# Patient Record
Sex: Male | Born: 1965
Health system: Southern US, Community
[De-identification: ages and names within clinical notes are randomized; demographics above are authoritative.]

## PROBLEM LIST (undated history)

## (undated) ENCOUNTER — Ambulatory Visit (HOSPITAL_COMMUNITY): Disposition: A | Discharge status: Home / Self Care | Payer: Medicare Other

## (undated) DIAGNOSIS — F209 Schizophrenia, unspecified: Secondary | ICD-10-CM

## (undated) DIAGNOSIS — F32A Depression, unspecified: Secondary | ICD-10-CM

## (undated) DIAGNOSIS — F329 Major depressive disorder, single episode, unspecified: Secondary | ICD-10-CM

## (undated) DIAGNOSIS — I10 Essential (primary) hypertension: Secondary | ICD-10-CM

## (undated) DIAGNOSIS — F200 Paranoid schizophrenia: Secondary | ICD-10-CM

---

## 2000-03-29 ENCOUNTER — Encounter: Payer: Self-pay | Admitting: Emergency Medicine

## 2000-03-29 ENCOUNTER — Emergency Department (HOSPITAL_COMMUNITY): Admission: EM | Admit: 2000-03-29 | Discharge: 2000-03-29 | Payer: Self-pay | Admitting: Emergency Medicine

## 2001-05-24 ENCOUNTER — Encounter: Payer: Self-pay | Admitting: Emergency Medicine

## 2001-05-24 ENCOUNTER — Emergency Department (HOSPITAL_COMMUNITY): Admission: EM | Admit: 2001-05-24 | Discharge: 2001-05-25 | Payer: Self-pay | Admitting: Podiatry

## 2001-06-05 ENCOUNTER — Encounter: Payer: Self-pay | Admitting: Emergency Medicine

## 2001-06-05 ENCOUNTER — Emergency Department (HOSPITAL_COMMUNITY): Admission: EM | Admit: 2001-06-05 | Discharge: 2001-06-05 | Payer: Self-pay | Admitting: Emergency Medicine

## 2003-10-30 ENCOUNTER — Emergency Department (HOSPITAL_COMMUNITY): Admission: EM | Admit: 2003-10-30 | Discharge: 2003-10-31 | Payer: Self-pay | Admitting: Emergency Medicine

## 2004-01-21 ENCOUNTER — Emergency Department (HOSPITAL_COMMUNITY): Admission: EM | Admit: 2004-01-21 | Discharge: 2004-01-21 | Payer: Self-pay | Admitting: Emergency Medicine

## 2004-07-21 ENCOUNTER — Ambulatory Visit: Payer: Self-pay | Admitting: Psychiatry

## 2004-07-21 ENCOUNTER — Inpatient Hospital Stay (HOSPITAL_COMMUNITY): Admission: RE | Admit: 2004-07-21 | Discharge: 2004-07-29 | Payer: Self-pay | Admitting: Psychiatry

## 2004-07-24 ENCOUNTER — Encounter: Payer: Self-pay | Admitting: Emergency Medicine

## 2005-07-02 ENCOUNTER — Emergency Department (HOSPITAL_COMMUNITY): Admission: EM | Admit: 2005-07-02 | Discharge: 2005-07-02 | Payer: Self-pay | Admitting: Family Medicine

## 2005-07-27 ENCOUNTER — Emergency Department (HOSPITAL_COMMUNITY): Admission: EM | Admit: 2005-07-27 | Discharge: 2005-07-27 | Payer: Self-pay | Admitting: Family Medicine

## 2005-11-20 ENCOUNTER — Emergency Department (HOSPITAL_COMMUNITY): Admission: EM | Admit: 2005-11-20 | Discharge: 2005-11-20 | Payer: Self-pay | Admitting: Family Medicine

## 2005-12-29 ENCOUNTER — Emergency Department (HOSPITAL_COMMUNITY): Admission: EM | Admit: 2005-12-29 | Discharge: 2005-12-29 | Payer: Self-pay | Admitting: Family Medicine

## 2006-07-02 ENCOUNTER — Emergency Department (HOSPITAL_COMMUNITY): Admission: EM | Admit: 2006-07-02 | Discharge: 2006-07-03 | Payer: Self-pay | Admitting: Emergency Medicine

## 2006-10-24 ENCOUNTER — Emergency Department (HOSPITAL_COMMUNITY): Admission: EM | Admit: 2006-10-24 | Discharge: 2006-10-24 | Payer: Self-pay | Admitting: Emergency Medicine

## 2007-04-03 ENCOUNTER — Inpatient Hospital Stay (HOSPITAL_COMMUNITY): Admission: AD | Admit: 2007-04-03 | Discharge: 2007-04-05 | Payer: Self-pay | Admitting: *Deleted

## 2007-04-03 ENCOUNTER — Ambulatory Visit: Payer: Self-pay | Admitting: *Deleted

## 2008-02-09 ENCOUNTER — Encounter: Payer: Self-pay | Admitting: Emergency Medicine

## 2008-02-10 ENCOUNTER — Inpatient Hospital Stay (HOSPITAL_COMMUNITY): Admission: AD | Admit: 2008-02-10 | Discharge: 2008-02-14 | Payer: Self-pay | Admitting: Psychiatry

## 2008-02-12 ENCOUNTER — Ambulatory Visit: Payer: Self-pay | Admitting: *Deleted

## 2008-02-13 ENCOUNTER — Encounter: Payer: Self-pay | Admitting: Emergency Medicine

## 2008-08-08 ENCOUNTER — Inpatient Hospital Stay (HOSPITAL_COMMUNITY): Admission: AD | Admit: 2008-08-08 | Discharge: 2008-08-14 | Payer: Self-pay | Admitting: Psychiatry

## 2008-08-08 ENCOUNTER — Encounter: Payer: Self-pay | Admitting: Emergency Medicine

## 2008-08-08 ENCOUNTER — Ambulatory Visit: Payer: Self-pay | Admitting: Psychiatry

## 2008-10-04 ENCOUNTER — Emergency Department (HOSPITAL_COMMUNITY): Admission: EM | Admit: 2008-10-04 | Discharge: 2008-10-04 | Payer: Self-pay | Admitting: Emergency Medicine

## 2008-10-14 ENCOUNTER — Emergency Department (HOSPITAL_COMMUNITY): Admission: EM | Admit: 2008-10-14 | Discharge: 2008-10-14 | Payer: Self-pay | Admitting: Family Medicine

## 2009-03-10 ENCOUNTER — Emergency Department (HOSPITAL_COMMUNITY): Admission: EM | Admit: 2009-03-10 | Discharge: 2009-03-10 | Payer: Self-pay | Admitting: Emergency Medicine

## 2009-12-09 ENCOUNTER — Other Ambulatory Visit: Payer: Self-pay

## 2009-12-10 ENCOUNTER — Inpatient Hospital Stay (HOSPITAL_COMMUNITY): Admission: AD | Admit: 2009-12-10 | Discharge: 2009-12-16 | Payer: Self-pay | Admitting: Psychiatry

## 2009-12-10 ENCOUNTER — Ambulatory Visit: Payer: Self-pay | Admitting: Psychiatry

## 2010-02-14 ENCOUNTER — Ambulatory Visit: Payer: Self-pay | Admitting: Cardiovascular Disease

## 2010-02-16 ENCOUNTER — Encounter (INDEPENDENT_AMBULATORY_CARE_PROVIDER_SITE_OTHER): Payer: Self-pay | Admitting: Internal Medicine

## 2010-02-26 ENCOUNTER — Telehealth (INDEPENDENT_AMBULATORY_CARE_PROVIDER_SITE_OTHER): Payer: Self-pay | Admitting: *Deleted

## 2010-03-02 ENCOUNTER — Telehealth (INDEPENDENT_AMBULATORY_CARE_PROVIDER_SITE_OTHER): Payer: Self-pay

## 2010-03-25 ENCOUNTER — Encounter (INDEPENDENT_AMBULATORY_CARE_PROVIDER_SITE_OTHER): Payer: Self-pay | Admitting: *Deleted

## 2010-10-22 ENCOUNTER — Observation Stay (HOSPITAL_COMMUNITY): Admission: EM | Admit: 2010-10-22 | Discharge: 2010-02-16 | Payer: Self-pay | Admitting: Emergency Medicine

## 2010-12-15 NOTE — Progress Notes (Signed)
Summary: Nuclear Pre-Procedure  Phone Note Outgoing Call Call back at [phone] CELL   Call placed by: Stanton Kidney, EMT-P,  February 26, 2010 2:53 PM Action Taken: Phone Call Completed Summary of Call: Left message with information on Myoview Information Sheet (see scanned document for details).     Nuclear Med Background Indications for Stress Test: Evaluation for Ischemia, Post Hospital   History: Echo  History Comments: 02/16/10 Echo: EF= 50-55%  Symptoms: Chest Pain, Syncope    Nuclear Pre-Procedure Cardiac Risk Factors: Family History - CAD, History of Smoking

## 2010-12-15 NOTE — Progress Notes (Signed)
Summary: Myoview No Show  Phone Note Outgoing Call Call back at [facility] Phone [phone]   Call placed by: Irean Hong, RN,  March 02, 2010 1:10 PM Summary of Call: The patient was a no show for myoview today. The home phone number was incorrect, so left a message at the father's home for the patient  to call me back.Aleysia Oltmann,RN. The patient's father called back, and had left messages for the patient to call us. The patient's aunt died this weekend per the patient's father per Milana Na. Notified Dr. Leonard Schwartz Brodie's nurse of no show. Raniyah Curenton,RN.

## 2010-12-15 NOTE — Letter (Signed)
Summary: Appointment - Missed  [facility], Main Office  [address]   [place], [place] [address]   Phone: [phone]  Fax: [phone]     Mar 25, 2010 MRN: [id number]   [facility] [address] [place], [place]  [address]   Dear Damon [name],  Our records indicate you missed your appointment on 03/13/2010 with Dr. Riley Kill. It is very important that we reach you to reschedule this appointment. We look forward to participating in your health care needs. Please contact us at the number listed above at your earliest convenience to reschedule this appointment.     Sincerely, Neurosurgeon Team LG

## 2011-01-31 LAB — CBC
HCT: 39.5 % (ref 39.0–52.0)
Hemoglobin: 13.2 g/dL (ref 13.0–17.0)
MCHC: 33.5 g/dL (ref 30.0–36.0)
RBC: 4.33 MIL/uL (ref 4.22–5.81)
RDW: 13.2 % (ref 11.5–15.5)

## 2011-01-31 LAB — URINALYSIS, ROUTINE W REFLEX MICROSCOPIC
Nitrite: NEGATIVE
Protein, ur: NEGATIVE mg/dL
Specific Gravity, Urine: 1.02 (ref 1.005–1.030)
Urobilinogen, UA: 1 mg/dL (ref 0.0–1.0)

## 2011-01-31 LAB — DIFFERENTIAL
Basophils Absolute: 0 10*3/uL (ref 0.0–0.1)
Basophils Relative: 0 % (ref 0–1)
Eosinophils Relative: 3 % (ref 0–5)
Lymphocytes Relative: 29 % (ref 12–46)
Monocytes Absolute: 0.5 10*3/uL (ref 0.1–1.0)
Monocytes Relative: 9 % (ref 3–12)
Neutro Abs: 3.4 10*3/uL (ref 1.7–7.7)

## 2011-01-31 LAB — BASIC METABOLIC PANEL
CO2: 26 mEq/L (ref 19–32)
Calcium: 8.9 mg/dL (ref 8.4–10.5)
GFR calc Af Amer: >60 mL/min (ref >60)
Glucose, Bld: 99 mg/dL (ref 70–99)
Potassium: 4 mEq/L (ref 3.5–5.1)
Sodium: 137 mEq/L (ref 135–145)

## 2011-01-31 LAB — RAPID URINE DRUG SCREEN, HOSP PERFORMED
Barbiturates: NOT DETECTED
Opiates: NOT DETECTED

## 2011-01-31 LAB — LIPID PANEL
Cholesterol: 181 mg/dL (ref 0–200)
HDL: 86 mg/dL (ref >39)
VLDL: 8 mg/dL (ref 0–40)

## 2011-01-31 LAB — HEMOGLOBIN A1C: Mean Plasma Glucose: 117 mg/dL

## 2011-01-31 LAB — RPR: RPR Ser Ql: NONREACTIVE

## 2011-02-03 LAB — POCT I-STAT, CHEM 8
BUN: 12 mg/dL (ref 6–23)
Calcium, Ion: 1.13 mmol/L (ref 1.12–1.32)
Chloride: 107 mEq/L (ref 96–112)
Glucose, Bld: 87 mg/dL (ref 70–99)
TCO2: 26 mmol/L (ref 0–100)

## 2011-02-03 LAB — CBC
HCT: 42.1 % (ref 39.0–52.0)
Hemoglobin: 13.2 g/dL (ref 13.0–17.0)
Hemoglobin: 14.2 g/dL (ref 13.0–17.0)
MCHC: 33.7 g/dL (ref 30.0–36.0)
MCHC: 34.3 g/dL (ref 30.0–36.0)
MCV: 90.7 fL (ref 78.0–100.0)
MCV: 91.1 fL (ref 78.0–100.0)
Platelets: 273 10*3/uL (ref 150–400)
RBC: 4.24 MIL/uL (ref 4.22–5.81)
RDW: 13.5 % (ref 11.5–15.5)
WBC: 6 10*3/uL (ref 4.0–10.5)

## 2011-02-03 LAB — COMPREHENSIVE METABOLIC PANEL
ALT: 19 U/L (ref 0–53)
CO2: 26 mEq/L (ref 19–32)
Calcium: 8.8 mg/dL (ref 8.4–10.5)
Chloride: 105 mEq/L (ref 96–112)
GFR calc non Af Amer: >60 mL/min (ref >60)
Glucose, Bld: 87 mg/dL (ref 70–99)
Sodium: 138 mEq/L (ref 135–145)
Total Bilirubin: 1 mg/dL (ref 0.3–1.2)

## 2011-02-03 LAB — BASIC METABOLIC PANEL
BUN: 7 mg/dL (ref 6–23)
CO2: 24 mEq/L (ref 19–32)
Chloride: 105 mEq/L (ref 96–112)
Glucose, Bld: 97 mg/dL (ref 70–99)
Potassium: 3.9 mEq/L (ref 3.5–5.1)
Sodium: 136 mEq/L (ref 135–145)

## 2011-02-03 LAB — URINE DRUGS OF ABUSE SCREEN W ALC, ROUTINE (REF LAB)
Barbiturate Quant, Ur: NEGATIVE
Benzodiazepines.: NEGATIVE
Cocaine Metabolites: NEGATIVE
Creatinine,U: 85.4 mg/dL
Ethyl Alcohol: <10 mg/dL (ref <10)
Opiate Screen, Urine: NEGATIVE
Phencyclidine (PCP): NEGATIVE

## 2011-02-03 LAB — LIPID PANEL
Cholesterol: 186 mg/dL (ref 0–200)
HDL: 99 mg/dL (ref >39)
LDL Cholesterol: 79 mg/dL (ref 0–99)
Total CHOL/HDL Ratio: 1.9 RATIO

## 2011-02-03 LAB — POCT CARDIAC MARKERS: Troponin i, poc: <0.05 ng/mL (ref 0.00–0.09)

## 2011-02-03 LAB — CARDIAC PANEL(CRET KIN+CKTOT+MB+TROPI)
CK, MB: 1.3 ng/mL (ref 0.3–4.0)
Relative Index: 0.3 (ref 0.0–2.5)
Troponin I: 0.01 ng/mL (ref 0.00–0.06)

## 2011-02-24 LAB — URINALYSIS, ROUTINE W REFLEX MICROSCOPIC
Glucose, UA: NEGATIVE mg/dL
Hgb urine dipstick: NEGATIVE
Ketones, ur: 15 mg/dL — AB
Protein, ur: NEGATIVE mg/dL
pH: 6 (ref 5.0–8.0)

## 2011-02-24 LAB — POCT I-STAT, CHEM 8
BUN: 8 mg/dL (ref 6–23)
Calcium, Ion: 1.19 mmol/L (ref 1.12–1.32)
Creatinine, Ser: 1.2 mg/dL (ref 0.4–1.5)
Hemoglobin: 15.3 g/dL (ref 13.0–17.0)
Sodium: 138 mEq/L (ref 135–145)
TCO2: 26 mmol/L (ref 0–100)

## 2011-02-24 LAB — RAPID URINE DRUG SCREEN, HOSP PERFORMED
Amphetamines: NOT DETECTED
Barbiturates: NOT DETECTED
Benzodiazepines: NOT DETECTED
Cocaine: NOT DETECTED

## 2011-02-24 LAB — ETHANOL: Alcohol, Ethyl (B): <5 mg/dL (ref 0–10)

## 2011-03-30 NOTE — H&P (Signed)
NAMEKINGSLEY, FARACE              ACCOUNT NO.:  [id number]   MEDICAL RECORD NO.:  [id number]          PATIENT TYPE:  IPS   LOCATION:  0403                          FACILITY:  BH   PHYSICIAN:  Anselm Jungling, MD  DATE OF BIRTH:  Aug 01, 1966   DATE OF ADMISSION:  08/08/2008  DATE OF DISCHARGE:                       PSYCHIATRIC ADMISSION ASSESSMENT   TIME:  1430 p.m.   IDENTIFYING INFORMATION:  This is a single [place] American male, 45  years old.  This is a voluntary admission.   HISTORY OF PRESENT ILLNESS:  This is the sixth [facility] admission for this 45  year old with a history of undifferentiated schizophrenia who presented  in the emergency room complaining of auditory hallucinations.  The  voices told him to go ahead and jump from a building last night, so he  did.  Just sustained a minor back strain and was treated with ibuprofen  in the emergency room.  He reports that he stopped taking his Haldol  around the middle of this summer.  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  Gradually  the auditory hallucinations returned.  Initially the voices were saying  fairly benign things.  They began to get more demanding and suspicious  in nature, and then told him that he should not pay his bills, that [employer] and other utilities were just trying to get his money.  He put  some money in the trust of a friend, who ended up spending it.  Now he  is facing eviction.  The voices continue to have considerable paranoid  content along with some commands for self-harm.  He reports when he was  on the medication he felt that he was doing quite well and he is asking  to get back on it.  He denies any substance abuse.  Tried alcohol once  earlier in his life and it did not agree with him.  Denies using any  other substances.   PAST PSYCHIATRIC HISTORY:  A sixth [facility] admission.  He was last here in  2023-04-04 of this year.  At that time stabilized  on 20 mg of Haldol p.o.  q.h.s.  He reports his usual dose at home is more like around 5 mg p.o.  q.h.s.  He denies trials of any other antipsychotics but the record  reflects that he has been previously treated with 0.25 mg of Risperdal  in the morning and 2 mg at bedtime on an earlier stay in 04/03/04.  Since  then he has been fairly consistently on Haldol, is followed by Dr. De Nurse at [facility].  He has a history of taking  Zoloft in the past for some depression but is currently not taking this.   SOCIAL HISTORY:  A single African American male.  Says that he does have  a girlfriend and this relationship does cause him some stress.  Mother  died of colon cancer in 04-Apr-2007.  His sister had died approximately  2 months ago in [place] of a cardiac condition.  He has a brother here  in town from whom he is estranged.  He has also recently had an aunt  that died.  He lives alone in his own apartment and receives disability.  He completed high school, has been married and divorced once.  He has 4  sons ages 24, 41, 73 and 75 that live with his wife.  He had vocational  training as a Games developer.  He receives Social Security disability  income for his schizophrenia.   FAMILY HISTORY:  Several uncles with problems with substance abuse.  He  also reports mother, father, a brother and a sister also with mental  illness.   MEDICAL HISTORY:  No primary care practitioner.   CURRENT MEDICATIONS:  Haldol dose unknown, last discharged on 20 mg p.o.  q.h.s.   DRUG ALLERGIES:  None.   POSITIVE PHYSICAL FINDINGS:  Physical exam was done in the emergency  room.  This is a 5 feet 10 inch tall African American male, slim build,  appears healthy, 149 pounds, temperature 98, pulse 61, respirations 20,  blood pressure 134/80.   He was treated with ibuprofen in the emergency room.   Lumbar spine films were negative for any acute injury.  Today he has no  subjective  complaints.   DIAGNOSTIC STUDIES:  Alcohol level 11.  Urine drug screen negative for  all substances.  Urinalysis within normal limits.  CBC:  WBC 6.4,  hemoglobin 14.1, hematocrit 41.7 and platelets 290,000.  Chemistry:  Sodium 139, potassium 3.9, chloride 106, carbon dioxide 25, BUN 8,  creatinine 1.3, and random glucose is 80.   MENTAL STATUS EXAM:  A fully alert gentleman, pleasant, cooperative,  reclining in bed, fully awake and alert.  Speech is normal.  He is  polite.  Affect appropriate.  Quick responses.  Good eye contact.  Fully  engaged in conversation.  Mood is neutral.  He does admit to being quite  worried about his bills, afraid that he is going to be evicted from his  apartment.  Thought process is logical and coherent.  He is denying any  auditory hallucinations today and he did receive Haldol 10 mg yesterday  evening.  He is oriented to person, place and situation.  No delusional  statements made today.  No evidence of guarding or paranoia.  Denies any  intent for suicide.  Expresses that his main goal is to get back on his  medications and stay on his medications.  He is fearful of  hallucinations.   AXIS I:  Schizophrenia, undifferentiated acute exacerbation.  Rule out  alcohol abuse.  AXIS II:  Deferred.  AXIS III:  No diagnosis.  AXIS IV:  Acute problem with finances.  AXIS V:  Current 48.  Past year not known.   PLAN:  To voluntarily admit him.  We have admitted him to our  stabilization unit, our intensive care unit, with a goal of improving  his reality testing, decreasing his paranoia and controlling his  symptoms.  He has requested to go down to 5 mg of Haldol q.h.s. and we  will do a trial of this.  Initially we provided Ativan 1 mg q.3 h p.r.n.  for any withdrawal but he has not had withdrawal symptoms, so we will  discontinue that at this time.  Meanwhile, we are going to ask our case  worker to work with him to see what we can do to help stabilize  his  situation with his finances, possibly intervene,  and he does not have a  regular primary care physician.  Will check a hemoglobin A1c, lipid  panel and TSH.      Margaret A. Lorin Picket, N.P.      Anselm Jungling, MD  Electronically Signed    MAS/MEDQ  D:  08/09/2008  T:  08/10/2008  Job:  [phone]

## 2011-03-30 NOTE — Discharge Summary (Signed)
NAMEMarland Kitchen  ERCELL, RAZON              ACCOUNT NO.:  [id number]   MEDICAL RECORD NO.:  [id number]          PATIENT TYPE:  IPS   LOCATION:  0300                          FACILITY:  BH   PHYSICIAN:  Jasmine Pang, M.D. DATE OF BIRTH:  05-18-1966   DATE OF ADMISSION:  04/03/2007  DATE OF DISCHARGE:  04/05/2007                               DISCHARGE SUMMARY   IDENTIFICATION:  A 45 year old single African American male admitted on  a voluntary basis on Apr 03, 2007.   HISTORY OF PRESENT ILLNESS:  The patient presented with a history of  depression and auditory hallucinations.  He states for about a week he  has been hearing voices telling him to hurt himself.  He has no specific  plans.  He had been off his medication for a couple of days because he  was in jail for 30 days for child support.  He was unable to get into  his home after discharge because the locks had been changed.  He denied  any substance abuse.  Stressors are that his mother died this year on  mother's day.  The patient has been hospitalized here in the past.  He  is outpatient at mental health services at [facility]  health.  He sees Dr. Hortencia Pilar.  He has a history of schizoaffective  disorder.  He denies is a nonsmoker, nondrinker, does not use drugs.  He  denies any acute or chronic health problems.  He has been on Haldol 2.5  mg p.o. nightly and Zoloft prior to admission.  Again, he reports being  noncompliant for at least the past 2 days possibly longer.   ALLERGIES:  He has no known drug allergies.   PHYSICAL FINDINGS:  This is a middle-aged male who was assessed at the  [facility] emergency department.  He is in no acute distress, resting in  bed.   ADMISSION LABORATORIES:  Hepatic function was within normal limits.  TSH  was 1.293 which was within normal limits.  The other labs were done in  the ED at [facility] emergency department:  White count was slightly low  at 10.7.  Urinalysis was  negative.  Urine drug screen was negative.  B-  Met was within normal limits.   HOSPITAL COURSE.:  Upon admission the patient was continued on Haldol  2.5 mg p.o. nightly.  He was also started on Seroquel 50 mg p.o. q. 6  hours p.r.n. anxiety.  He was started on Haldol 2 mg p.o. q. 6 hours  p.r.n. agitation.  Seroquel was discontinued.  He was started on  trazodone 50 mg p.o. nightly p.r.n. insomnia, may repeat times one.  He  was continued on Zoloft 50 mg p.o. daily.   The patient responded well to these medications.  No changes were made  since he seemed to do well on these medicines when he was compliant.  I  felt he needed to be back on his medicines rather than make changes due  to his history of noncompliance.  The patient was cooperative on the  unit.  He participated in  unit therapeutic groups and activities.  He  was friendly and cooperative.  He was upset because the locks on his  apartment had been changed by his father.  This was because someone had  gotten his keys and the neighborhood teenagers were partying in his  apartment.  He was relieved when he found out that he would be able to  get his keys back as soon as he was discharged.  The patient's sleep and  appetite were good.  He had no auditory or visual hallucinations.  Voices resolved.  On 04/05/2007 mental status had improved markedly from  admission status.  The patient was friendly cooperative with good eye  contact.  Speech was normal rate and flow.  Psychomotor activity was  within normal limits.  Mood was euthymic.  Affect wide range.  There was  no suicidal or homicidal ideation.  No thoughts of self injurious  behavior.  No auditory or visual hallucinations.  No paranoia or  delusions.  Thoughts were logical and goal-directed.  Thought content no  predominant theme.  Cognitive was grossly back to baseline.  It was felt  the patient was safe to be discharged home today.  His landlord was  going to give him his  new key so he could get into his apartment.   DISCHARGE DIAGNOSES:  AXIS I: Schizoaffective disorder, depressed mood.  AXIS II: None.  AXIS III: No acute or chronic health problems.  AXIS IV:  Severe (psychosocial problems, recent problems related to  legal system, burden of psychiatric illness).  AXIS V: GAF upon discharge was 50.  GAF upon admission was 30.  GAF  highest past year was 65.   DISCHARGE PLANS:  There were no specific activity level or dietary  restrictions.   POST HOSPITAL CARE PLANS:  The patient will be seen at the [facility] by Dr. Lang Snow on Thursday March 22 and 4:00 p.m.   DISCHARGE MEDICATIONS:  Zoloft 50 mg p.o. daily Haldol 2.5 mg p.o.  nightly, trazodone 50 mg 1-2 pills at bedtime p.r.n. insomnia.      Jasmine Pang, M.D.  Electronically Signed     BHS/MEDQ  D:  04/05/2007  T:  04/05/2007  Job:  [id number]

## 2011-03-30 NOTE — H&P (Signed)
NAMEMarland Kitchen  ENNIO, HOUP              ACCOUNT NO.:  [id number]   MEDICAL RECORD NO.:  [id number]          PATIENT TYPE:  IPS   LOCATION:  0300                          FACILITY:  BH   PHYSICIAN:  Jasmine Pang, M.D. DATE OF BIRTH:  1966-05-14   DATE OF ADMISSION:  04/03/2007  DATE OF DISCHARGE:                       PSYCHIATRIC ADMISSION ASSESSMENT   A 45 year old single African male voluntarily admitted on Apr 03, 2007.   HISTORY OF PRESENT ILLNESS:  The patient presented with a history of  depression, auditory hallucinations for about a week telling him to  hurt himself. No specific plan.  He has been off his medications for a  couple days because he states he was in jail for 30 days for child  support, and was unable to get to his home.  He denies any substance  abuse.  Stressors are that his mother died this year on Mother's Day.   PAST PSYCHIATRIC HISTORY:  The patient has been hospitalized here prior.  Is outpatient for mental health services at [facility].  Sees Dr. Hortencia Pilar.  He is a history of schizoaffective  disorder.   SOCIAL HISTORY:  This is a 45 year old single African male.  He has 4  boys.  He does not live with them.  The patient states he lives alone.  He has been in jail recently for 30 days for child support.  He is not  on probation at this time.   FAMILY HISTORY:  None.   ALCOHOL AND DRUG HISTORY:  Nonsmoker, nondrinker.  No drug use.   PRIMARY CARE Cian Costanzo:  He has no primary care Kameryn Tisdel.   MEDICAL PROBLEMS:  Denies any acute or chronic health issues.   MEDICATIONS:  He has been on Haldol 2.5 mg at h.s. and Zoloft a half a  tablet.  Again reports noncompliant for 2 days.   DRUG ALLERGIES:  No known allergies.   PHYSICAL EXAM:  This is a middle-aged male that was assessed at [facility] Emergency Department.  He is in no acute distress, resting in bed.  Temperature is 97.6, 77 heart rate, 20 respirations, blood pressure  132/82, 5 feet 11 inches tall, 147 pounds, 98% saturated.   LABORATORY DATA:  White count is 10.7.  Urinalysis is negative.  Urine  drug screen is negative.  His BMET is within normal limits.   MENTAL STATUS EXAM:  He is sleepy in bed.  He is cooperative.  Fair eye  contact.  Speech is clear.  Does not offer other information other than  what is asked.  Mood is depressed.  The patient's affect is flat.  Though process endorsing auditory hallucinations with suicidal thoughts.  Promises safety.  Does not appear to be actively responding.  Cognitive  function intact.  Memory is fair.  Judgment and insight is fair.   AXIS I:  Schizoaffective disorder.  AXIS II:  Deferred.  AXIS III:  No acute or chronic health issues.  AXIS IV:  Possible problems with housing, other psychosocial problems,  recent problems related to legal system.  AXIS V:  Current is 30.  PLANS:  Contract for safety, stabilize mood. We will resume his Haldol  and Zoloft.  We will contact father for background information with the  patient's permission.  We will reinforce medication compliance.  The  patient is to follow up wit [facility].  Tentative  length of stay is 4 to 6 days.      [name], N.P.      Jasmine Pang, M.D.  Electronically Signed    JO/MEDQ  D:  04/03/2007  T:  04/03/2007  Job:  [id number]

## 2011-03-30 NOTE — H&P (Signed)
NAMEMarland Kitchen  [name], Damon              ACCOUNT NO.:  [id number]   MEDICAL RECORD NO.:  [id number]          PATIENT TYPE:  IPS   LOCATION:  0401                          FACILITY:  BH   PHYSICIAN:  Anselm Jungling, MD  DATE OF BIRTH:  03-15-1966   DATE OF ADMISSION:  02/10/2008  DATE OF DISCHARGE:                       PSYCHIATRIC ADMISSION ASSESSMENT   IDENTIFYING INFORMATION/JUSTIFICATION FOR ADMISSION AND CARE:  This is a  45 year old divorced African-American male. He presented at the [facility] emergency department yesterday. He acknowledged  that he was having auditory hallucinations. He had been off his  medications for several weeks. The auditory hallucinations were command  to hurt others and he was also thinking that his food and mediations  were poisoned because of the auditory hallucinations. He states a few  weeks ago, it got him upset that he was not working and he tried to work  and he thought that by stopping his medication, it would allow him to  work better. However, he lost his job and now he has hallucinations due  to non-compliance.   PAST PSYCHIATRIC HISTORY:  He was with [place] last May, Mar 30, 2007 to Apr 05, 2007 and he has had outpatient followup at the [facility] under the care of Dr. Hortencia Pilar.   SOCIAL HISTORY:  He completed high school. He has also had vocational  training as a Games developer. He has been married and divorced once. He  has 4 sons, ages 62, 59, 34, and 75. He gets SSDI for his schizophrenia  and he lives alone in an apartment.   FAMILY HISTORY:  He reports that his mother, father, brother, and sister  all have mental illness and are all on disability except his mother has  passed several years ago.   ALCOHOL/DRUG HISTORY:  He states his uncles abuse substances.   PRIMARY CARE PHYSICIAN:  He has no PCC. He is followed by Dr. Hortencia Pilar  at [facility].   PAST MEDICAL HISTORY:  He  does not know of any.   MEDICATIONS:  He reports that he is prescribed Haldol and Zoloft from  [facility] and he also gets his medications through  their pharmacy, so we cannot reconcile his dosages.   ALLERGIES:  NO KNOWN DRUG ALLERGIES.   PHYSICAL EXAMINATION:  GENERAL:  He was medically cleared in the  emergency department at [facility]. He specifically  had no alcohol or any other abnormal substances or illicit substances.  VITAL SIGNS:  He is 71 inches tall. He weighs 180. Temperature 98.6,  blood pressure 111/74, pulse 95.   MENTAL STATUS EXAM:  He is alert and oriented. He is appropriately  groomed, dressed, and nourished. He is in a hospital gown. Speech is  normal rate, rhythm, and tone. Mood is euthymic. Affect has a normal  range. Judgment and insight are intact. He realizes he needs to stay  medication compliance. Concentration and memory are intact. Intelligence  is at least average. He denies being suicidal or homicidal at this time.  He also reports that  the auditory hallucinations are decreasing and that  he cannot discern what in fact that are saying and now mumbling. He was  encouraged that this meant that he was responding to his medication.   DIAGNOSIS:  AXIS I:     Schizophrenia, undifferentiated type.  AXIS II:    Deferred.  AXIS III:   None known.  AXIS IV:    Burden of illness.  AXIS V:     30.   PLAN:  1. Admit him for safety and stabilization.  2. Restart his medication. Toward that end, Haldol 10 mg p.o. b.i.d.      was started, as well as Cogentin 1 mg b.i.d.  3. 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   ESTIMATED LENGTH OF STAY:  No more than 4 or 5 days.      Mickie Leonarda Salon, P.A.-C.      Anselm Jungling, MD  Electronically Signed    MD/MEDQ  D:  02/10/2008  T:   02/10/2008  Job:  [id number]

## 2011-04-02 NOTE — H&P (Signed)
[name]  Damon, [name]                        ACCOUNT NO.:  [id number]   MEDICAL RECORD NO.:  [id number]                   PATIENT TYPE:  IPS   LOCATION:  0404                                 FACILITY:  [facility]   PHYSICIAN:  Geoffery Lyons, M.D.                   DATE OF BIRTH:  1966-06-03   DATE OF ADMISSION:  07/21/2004  DATE OF DISCHARGE:                         PSYCHIATRIC ADMISSION ASSESSMENT   IDENTIFYING INFORMATION:  The patient is a 45 year old divorced African-  American male voluntarily admitted on July 21, 2004.   HISTORY OF PRESENT ILLNESS:  The patient presents with a history of positive  auditory hallucinations of command type.  He has been hearing voices since  yesterday.  He states they are controlling him to put up cameras, telling  him not to trust anyone.  He reports he is having difficulty establishing  reality, experiencing positive paranoid ideation.  He feels that someone is  following him although he has no reason for this.  He states that although  he has heard voices in the past, they are the most intense at this time.  The patient's stressors are that his friend does not understand his illness.  He is also experiencing positive homicidal ideation through the voices with  voices telling him to hurt anyone who tries to hurt you.  The patient  reports his biggest enemy is himself and he was also having thoughts to harm  himself.  He has not been sleeping well.  His appetite has been decreased  with a 10 pound weight loss.   PAST PSYCHIATRIC HISTORY:  This is the second hospitalization at [facility].  He was here when it was [facility].  He has been at  [facility] in the past for hallucinations.  He sees Dr. Hortencia Pilar  at [facility].  He had a suicide gesture in the  past when he tried to step out into traffic.   SUBSTANCE ABUSE HISTORY:  The patient chews tobacco.  He denies any alcohol  or drug  use.   PAST MEDICAL HISTORY:  Primary care Anwar Crill: None.  Medical problems: None.   MEDICATIONS:  He has been on Haldol 2 mg at bedtime, was on that for years,  stopped it approximately 90 days ago due to side effects and then felt once  he stopped that he was having difficulty getting back on the medicine as  voices were telling him that he did not feel like he needed to continue with  any medications.   DRUG ALLERGIES:  No known allergies.   REVIEW OF SYSTEMS:  Some occasional nausea, difficulty with insomnia.  The  patient does chew tobacco.  He denies any chest pain or shortness of breath.  Review of others systems is negative.   PHYSICAL EXAMINATION:  GENERAL:  Physical examination was performed.  He is  a well nourished male in no  acute distress.  VITAL SIGNS:  Temperature 98.3, heart rate 75, respirations 16, blood  pressure 141/79.  He is 5 feet 11 inches tall.  NECK:  Negative lymphadenopathy.  CHEST:  Clear.  HEART:  Regular rate and rhythm.  ABDOMEN:  Soft, nontender.  SKIN:  Warm and dry with no rashes or lesions.  NEUROLOGIC:  Findings are intact, nonfocal.   LABORATORY DATA:  CBC is within normal limits.  CMET: BUN is 5.  TSH 1.083.  Urinalysis and urine drug screen are still pending.   SOCIAL HISTORY:  This is a 45 year old divorced African-American male.  He  has four boys ages 24, 23, 33, and 10.  The children are with the mother of  those children.  He was married for six years.  He was living with his  friend although he feels that because of his actions and behavior that his  living situation may not still be in place.  He is on disability and feels  there may be some possibility of legal charges as he had put up several  different surveillance cameras in his home.   FAMILY HISTORY:  Brother with schizophrenia as well as his mother who is  currently deceased, sister with bipolar disorder.   MENTAL STATUS EXAM:  This is a cooperative male, no eye contact,  casually  dressed.  Speech is clear.  Mood is depressed and anxious.  The patient has  some mild anxiety.  Thought processes: The patient provides coherent answers  although he is experiencing positive auditory hallucinations, ruminating  over trying to understand what is real and what is not.  Cognitive  functioning: Intact.  Memory is fair.  Judgment is fair.  Insight is fair.   ADMISSION DIAGNOSES:   AXIS I:  Paranoid schizophrenia.   AXIS II:  Deferred.   AXIS III:  None.   AXIS IV:  Problems possibly related to housing, other psychosocial problems  related to support of his mental illness.   AXIS V:  Current is 30, this past year is 72.   INITIAL PLAN OF CARE:  Plan is to stabilize mood and thinking.  Will add  Risperdal for psychosis, consider intramuscular Risperdal.  Will add Zoloft  for the patient's complaints of depression and social anxiety.  Will also  have Ativan available throughout the day for symptoms of anxiety, have  Ambien for sleep.  Will have a family session with the patient's support  group.  Reassure the patient.  The patient is to follow up at [facility], to be medication compliant.   ESTIMATED LENGTH OF STAY:  Five to six days.     [name], N.P.                       Geoffery Lyons, M.D.    JO/MEDQ  D:  07/22/2004  T:  07/22/2004  Job:  [id number]

## 2011-04-02 NOTE — Discharge Summary (Signed)
NAMEMarland [name]  NILS, THOR              ACCOUNT NO.:  [id number]   MEDICAL RECORD NO.:  [id number]          PATIENT TYPE:  IPS   LOCATION:  0404                          FACILITY:  [facility]   PHYSICIAN:  Geoffery Lyons, M.D.      DATE OF BIRTH:  1966/08/12   DATE OF ADMISSION:  07/21/2004  DATE OF DISCHARGE:  07/29/2004                                 DISCHARGE SUMMARY   CHIEF COMPLAINT AND PRESENTING ILLNESS:  This was the second admission to  [facility]  for this 45 year old divorced African-American  male, voluntarily admitted.  History of positive auditory hallucinations  with command type.  Hearing voices since the day before.  They were  controlling him to put up cameras, telling him not to trust anyone.  Having  difficulty establishing reality.  Positive for paranoid ideation, felt that  someone was following him, although he has no reason for this.  Although he  has heard voices in the past they are more intensive this time.  Stressors  are that his friend does not understand his illness, also experiencing  positive homicidal ideas toward the voices, with voices telling him to hurt  anyone who tries to hurt him.  He claims his biggest enemy is himself.  He  was also having thoughts to harm himself.  Not sleeping well, decreased  appetite, has lost 10 pounds.   PAST PSYCHIATRIC HISTORY:  Second time [employer], was admitted to  [facility] as well as [facility].  Sees Dr. Hortencia Pilar in  [place].   ALCOHOL AND DRUG HISTORY:  Denies the use or abuse of any substances.   PAST MEDICAL HISTORY:  Noncontributory.   MEDICATIONS:  Haldol which he stopped 90 days ago due to side effects.   PHYSICAL EXAMINATION:  Performed, failed to show any acute findings.   LABORATORY WORKUP:  CBC within normal limits.  CMET within normal limits.  TSH 1.083.  Drug screen negative for substances of abuse.   MENTAL STATUS EXAM:  Upon admission revealed an alert,  cooperative male, no  eye contact, casually dressed.  Speech was clear, mood was depressed and  anxious, some mild anxiety.  Thought process, provide coherent answers  although experiencing positive auditory hallucinations, ruminating over  trying to understand what is real and what is not.  Cognition well  preserved.   ADMISSION DIAGNOSES:   AXIS I:  Rule out paranoid schizophrenia.   AXIS II:  No diagnosis.   AXIS III:  None.   AXIS IV:  Moderate.   AXIS V:  Global assessment of function upon admission 30, highest global  assessment of function in past year 65.   COURSE IN HOSPITAL:  He was admitted and started on individual and group  psychotherapy.  He was started on Risperdal 0.5.  He was given Ativan as  needed as well as Cogentin.  He was given Ambien for sleep.  He was placed  on Risperdal 0.25 in the morning, 0.5 at night.  He was placed on Zoloft 25  mg daily.  Risperdal was increased to 0.25 in the  morning and 0.75 at night.  We continued to work with the Risperdal, up to 0.25 twice a day and 2 mg at  night.  Initially he endorsed worsening of the hallucinations, never like he  had experienced them at the time of this evaluation.  Also endorsed  depression, anxiety, overwhelmed, overwhelmed with the voices.  He was  mostly secluded to self, not interacting much with people in the unit.  He  has some subjective changes on the left side.  They were thought to be  secondary to the medication.  September 8, endorsed that the voices were  starting to calm down, wanting to pursue the Risperdal further.  There were  no overt side effects.  September 12, had an episode where he felt really  threatened, very paranoid, strong response towards another patient and he  understood that this patient had not done anything to him so he was able to  check his own reality.  We continued to increase the Risperdal.  He was  concerned about the girlfriend as he wanted to communicate to the  girlfriend  that he was in the hospital but he had no means of getting in touch with  her, only if his father would get the phone number and the father was not  willing to do that.  That created some stress, but eventually he was able to  deal with it.  On September 14 he was in full contact with reality.  Endorsed no suicidal ideas, no homicidal ideas, no hallucinations, no  delusions.  The voices were under control.  There was increased insight,  committed to abstinence, will to pursue further outpatient treatment and be  compliant with medications.   DISCHARGE DIAGNOSES:   AXIS I:  Schizophrenia, paranoid type.   AXIS II:  No diagnosis.   AXIS III:  No diagnosis.   AXIS IV:  Moderate.   AXIS V:  Global assessment of function upon discharge 50.   DISCHARGE MEDICATIONS:  1.  Cogentin 0.5 twice a day.  2.  Zoloft 50 mg daily.  3.  Risperdal 0.25 twice a day and 2 mg at night.  4.  Ambien 10 at bedtime for sleep.   DISPOSITION:  Follow up with [facility].     Farrel Gordon   IL/MEDQ  D:  08/19/2004  T:  08/20/2004  Job:  [id number]

## 2011-04-02 NOTE — Discharge Summary (Signed)
NAMEMarland Kitchen  Damon [name], Damon [name]              ACCOUNT NO.:  [id number]   MEDICAL RECORD NO.:  [id number]          PATIENT TYPE:  IPS   LOCATION:  0403                          FACILITY:  [facility]   PHYSICIAN:  Jasmine Pang, M.D. DATE OF BIRTH:  09-03-1966   DATE OF ADMISSION:  08/08/2008  DATE OF DISCHARGE:  08/14/2008                               DISCHARGE SUMMARY   IDENTIFYING INFORMATION:  This is a 45 year old single African American  male who was admitted on a voluntary basis on August 08, 2008.   HISTORY OF PRESENT ILLNESS:  This is a 6th [facility] admission for this 35-  year-old with a history of undifferentiated schizophrenia, who presented  in the emergency room complaining of auditory hallucinations.  The  voices told him to go ahead and jump from a building last night, so he  did.  He sustained a minor back strain and was treated with ibuprofen in  the emergency room.  He reports that he stopped taking his Haldol around  the middle of the summer.  At that point, he was working a Holiday representative  job.  Originally, the auditory hallucinations were drawn.  Initially,  the voices were saying fairly benign things.  They began, however, to  get more demanding and suspicious in nature.  They told him he should  not pay his bills, due to power of utilities or just trying to get his  money.  He put some money in the trust of a friend, who ended up  spending it.  Now, he is facing eviction.  The voices continue and have  considerable paranoid content along with some commands for self-harm.  He reports when he was on medication, he felt he was doing quite well.  He was asking to get back on it.  He denies any substance abuse.  He  tried alcohol once earlier in his life and it did not agree with him.  He denies using any other substances.   PAST PSYCHIATRIC HISTORY:  This is the 6th [facility] admission.  He was last  here in the May of this year.  At that time, he was stabilized on 20 mg  of Haldol p.o.  q.h.s.  He reports his usual dose at home as more like 5  mg p.o. q.h.s.  He denies trails of other antipsychotics, but the record  reflects that he has previously been treated with 0.25 mg of Risperdal  in the morning and 2 mg in the bedtime on an earlier stay in 2005.  Since then he has been fairly consistently on Haldol.  He is followed by  Dr. De Nurse at the [facility].  He has a  history of taking Zoloft in the past for some depression, but is  currently not taking this.   FAMILY HISTORY:  Several uncles have problems with substance abuse.  He  also reports mother, father, brother, and sister have mental illness.   CURRENT MEDICATIONS:  Haldol, dose unknown.  Last discharge on 20 mg  p.o. q.h.s.   DRUG ALLERGIES:  None.   MEDICAL HISTORY:  None.  PHYSICAL FINDINGS:  Physical exam was done in the emergency room.  There  were no acute physical or medical problems noted.  Lumbar spine films  were negative for any acute injury.  Today, he has no subjective  complaints.   DIAGNOSTIC STUDIES:  Alcohol level was 11.  Urine drug screen negative  for all substances.  Urinalysis within normal limits.  CBC reveals WBC  of 4.6, hemoglobin of 14.1, hematocrit of 41.7, and platelets 290,000.  Chemistries revealed a sodium of 139, potassium of 3.9, chloride of 106,  carbon dioxide 25, BUN 80, creatinine 1.3, random glucose 80.   HOSPITAL COURSE:  Upon admission, the patient was started on Ambien 10  mg p.o. q.h.s. p.r.n. insomnia.  He was also started on Dulcolax 2  tablets p.o. q.h.s.,  Epogen 1 mg p.o. b.i.d. and 1 mg t.i.d. p.r.n.  EPS, Colace 100 mg daily, MiraLax 17 g daily, Protonix 40 mg p.o.  b.i.d., Haldol 10 mg p.o. q.h.s. and 10 mg p.o. b.i.d. p.r.n. agitation,  Ativan 1 mg every 3 hours p.r.n. withdrawal or anxiety symptoms.  On  August 09, 2008, Haldol was decreased to 5 mg p.o. q.h.s. and 5 mg  p.o. q.6 hours p.r.n. agitation or  hallucinations.  The Ativan was  discontinued.  Hemoglobin A1c was 5.7.  Lipid profile was remarkable for  cholesterol of 203 (0 to 200).  TSH was within normal limits.  He was  also started on nicotine 21 mg patch daily.  On August 11, 2008,  Haldol was increased to 10 mg p.o. q.h.s.  In individual sessions, the  patient was pleasant with no delusional statements, he appeared it will  not help.  There was no suicidal ideation.  He was noted to be somewhat  better with the Haldol.  He still hears voices, but less intense and  less repugnance.  He is tolerating his medications well.  On August 11, 2008, the patient continued to do well on the increased Haldol.  He  denied any command or auditory hallucinations.  On August 12, 2008,  the patient was struggling with severe torticollis for several hours.  He was unrelieved by previous dose of the Cogentin and Benadryl.  He was  scared and crying.  He feels he is starting to have trouble breathing.  He was given Benadryl 50 mg IM now and Ativan 2 mg IM now.  He was able  to respond well to this intervention with no muscle stiffness.  No  cogwheeling.  Full passive and active mobility of neck.  On August 13, 2008, he was started on Abilify 10 mg p.o. q.h.s.  His sleep was  good.  Appetite was good.  He stated he was beginning to like food  now.  Mood was less depressed, less anxious.  EPS and dystonia had  resolved.  On August 14, 2008, mental status had improved markedly  from admission status.  The patient was less depressed, less anxious.  Affect was consistent with mood.  There was no suicidal or homicidal  ideation.  No thoughts of self-injurious behavior.  No auditory or  visual hallucinations.  No paranoia or delusions.  Thoughts were logical  and goal-directed.  Thought content no predominant theme.  Cognitive was  grossly back to baseline.  Insight fair.  Judgment fair.  Impulse  control was fair.   DISCHARGE  DIAGNOSES:  Axis I:  Schizophrenia undifferentiated type,  acute exacerbation, alcohol abuse.  Axis II:  None.  Axis  III:  No diagnosis.  Axis IV:  Acute problems with finances, burden of psychiatric illness -  severe.  Axis V: Global assessment of functioning was 55 at discharge.  GAF was  48 upon admission.  GAF highest past year was 60 to 65.   DISCHARGE PLANS:  There was no specific activity level or dietary  restrictions.   POSTHOSPITAL CARE PLANS:  The patient will go to the [facility] on  August 20, 2008, at 2:30 p.m.   DISCHARGE MEDICATIONS:  He will continue Abilify 10 mg p.o. q.h.s.      Jasmine Pang, M.D.  Electronically Signed     [facility]/MEDQ  D:  09/16/2008  T:  09/17/2008  Job:  [id number]

## 2011-04-02 NOTE — Consult Note (Signed)
NAMEMarland [name]  REGAN, LLORENTE              ACCOUNT NO.:  [id number]   MEDICAL RECORD NO.:  [id number]          PATIENT TYPE:  EMS   LOCATION:  MAJO                         FACILITY:  [facility]   PHYSICIAN:  Deanna Artis. Hickling, M.D.DATE OF BIRTH:  1966-03-26   DATE OF CONSULTATION:  07/24/2004  DATE OF DISCHARGE:                                   CONSULTATION   CHIEF COMPLAINT:  Left-sided numbness, weakness, and headache.   HISTORY OF PRESENT ILLNESS:  A 45 year old with paranoid schizophrenia with  command auditory hallucinations, suicidal and homicidal ideations who was  hospitalized at [name] on July 21, 2004.  The  patient had previously been at [name] and also at [name]  with similar complaints in the past.   The patient had sudden onset today of left hypoesthesia with give-away left-  sided weakness and complained of a left-sided pounding headache  simultaneously.  He also said he felt dizzy.  This occurred around 1500 and  he was transported to [facility].  Code stroke was called  at 1534.  He was evaluated initially by the ER physician, Dr. Cherre Huger,  and then by me.  He went to CT scan at 1617 hours.  Scan was interpreted by  me at 1630 hours.  It was entirely normal.   PAST MEDICAL HISTORY:  No prior history of weakness or migraine.  No illicit  drug use (negative toxic screen on admission to [name]).   PAST SURGICAL HISTORY:  None.   MEDICATIONS:  1.  Risperdal 0.25 mg in the morning, 0.75 mg at bed time.  2.  Ativan 1 mg q.6h. p.r.n.  3.  Cogentin 0.5 mg in the morning and at night time.  4.  Ambien 10 mg at night time p.r.n.  5.  Zoloft 25 mg q.d.   ALLERGIES:  None known.   REVIEW OF SYMPTOMS:  Remarkably only for insomnia.  Otherwise as noted  above.  A 12-system reviewed and otherwise negative.   FAMILY HISTORY:  Brother has schizophrenia and is deceased.  The sister has  bipolar affective disease.   SOCIAL HISTORY:  The patient is divorced.  He has four children-ages 16, 14,  13, and 11.  All are living with mom.  He is on disability.  She has refused  to take him back and the plan was for him to go live with an aunt.  He had  been living with a friend but his behavior had basically scared the friend  when he began to put monitoring devices around the house because of  paranoia.   PHYSICAL EXAMINATION:  GENERAL:  The patient was awake and alert.  The  patient showed no signs of infection, no meningism.  VITAL SIGNS:  Blood pressure 147/81, resting pulse 87, respirations 18.  Pulse oximetry 95%.  NECK:  No cranial or cervical bruits.  LUNGS:  Clear to auscultation.  HEART:  No murmurs.  PULSES:  Normal.  ABDOMEN:  Soft and nontender.  Bowel sounds normal.  EXTREMITIES:  Well-formed without edema or cyanosis.  NEUROLOGICAL:  The patient was awake, oriented to [name], not the name.  He thought it was September 05, 2004 and Sunday.  Cranial nerves:  Round,  reactive pupils.  Visual fields are full.  Symmetric facial strength.  Midline tongue.  Air conduction greater than bone conduction.  He splits the  midline with sensation.  Tuning fork is greater on the right than the left.  His tongue is midline.  He does not show signs of dysphagia or dysarthria.  Motor examination:  He has give-away strength on the left side.  His  strength is nearly normal in the arm and leg.  He has some bradykinesia  opposing his thumb and fingers on the left side, but when an object is  placed in his hand, he is fairly vassal with it.  He is entirely normal on  the right side.  Sensation:  He splits the midline with a left  hemihypesthesia to pinprick and cold.  He does better with stereognosis on  the left in that he can recognize large objects but not small ones.  He, for  example, could tell me that an object in his hand was made of wood but then  says he could not tell the  shape.  He immediately told me that it was a cube  when he put it in his hand.  He recognized a ball with the left hand.  Cerebellar examination:  No tremor.  He had full rapid repetitive movements  on the left.  Gait:  He has astasia/abasia and nearly fell when he got off  the bed.  Once I stabilized him, he walked with a very stiff leg and tended  to pick it up slightly.  He did not drag or did he circumduct it.  Deep  tendon reflexes were diminished.  He had bilateral flexor plantar responses.   IMPRESSION:  This is a nonphysiologic left hemiparesis.  We have performed a  CT scan of the brain which is normal.  We will perform a MRI scan limited to  confirm the absence of stroke.  At that time, we will transfer him back to  [name].  I spoke with Lynann Bologna, P.A. for Dr.  Geoffery Lyons and conveyed this.  She agreed to take him back in transfer.  We  have given him 325 mg of aspirin and given him liberal fluids.  I have also  given 650 mg of acetaminophen for his headache.       [facility]/MEDQ  D:  07/24/2004  T:  07/25/2004  Job:  [id number]   cc:   Geoffery Lyons, M.D.

## 2011-04-02 NOTE — Discharge Summary (Signed)
NAMEMarland Kitchen  WANDA, CELLUCCI NO.:  [id number]   MEDICAL RECORD NO.:  [id number]          PATIENT TYPE:  IPS   LOCATION:  0401                          FACILITY:  [facility]   PHYSICIAN:  Jasmine Pang, M.D. DATE OF BIRTH:  02-Oct-1966   DATE OF ADMISSION:  02/10/2008  DATE OF DISCHARGE:  02/14/2008                               DISCHARGE SUMMARY   IDENTIFICATION:  This is a 45 year old divorced African American male  who was admitted on February 10, 2008.   HISTORY OF PRESENT ILLNESS:  The patient presented to the [facility] Emergency Department.  He acknowledged that he was  having auditory hallucinations.  He had been taken off his medications  for several weeks.  The auditory hallucinations were command to hurt  others.  He was also thinking that his food and medications were  poisoned because of the auditory hallucinations.  He states a few weeks  ago it got him upset that he was not working and he tried to work and he  thought that by stopping his medication, it would allow him to work  better.  However, he lost his job and now he has hallucinations due to  noncompliance.   PAST PSYCHIATRIC HISTORY:  The patient was last with [place] Mar 30, 2007 to  Apr 05, 2007.  He has outpatient followup at the [facility] under the care of Dr. Hortencia Pilar.   FAMILY HISTORY:  Mother, father, brother, and sister all have mental  illness and are all in disability except for mother who passed away  several years ago.   PAST MEDICAL HISTORY:  The patient does not know of any medical  problems.   MEDICATIONS:  He reports  he is prescribed Haldol and Zoloft from  [facility].  He also gets his medications through  their pharmacy.   ALLERGIES:  No known drug allergies.   PHYSICAL EXAM:  The patient was medically cleared in the emergency  department at [facility].  There were no acute  medical or physical  problems noted.  The CBC was within normal limits.  The hepatic panel was within normal limits.   HOSPITAL COURSE:  Upon admission, the patient was started on Haldol 10  mg p.o. b.i.d. and Cogentin 1 mg p.o. b.i.d.  On February 11, 2008, Haldol  was changed to 20 mg p.o. q.h.s.  He was also given Ambien 10 mg p.o.  q.h.s. p.r.n. insomnia.  In individual sessions with me, the patient was  friendly and cooperative.  He remained depressed and anxious initially.  He wanted to be more interactive with people.  He states people scare  him.  He states he was having auditory hallucinations, but could not  understand what they were saying.  He was having positive visual  hallucinations little monsters and he was also having a bad cough and  blepharitis in his left eye as well as chronic constipation.  The  patient was started on Protonix 40 mg daily, Advair Diskus 50/100 1 puff  p.o. b.i.d., Robitussin DM, Patanol  ophthalmic solution 1 drop p.o.  b.i.d., MiraLax 17 g daily, Colace 100 mg daily, Claritin 10 mg daily.  He was transferred to the ED after he developed a fever of 102 to rule  out pneumonia.  He was started on azithromycin 250 mg p.o. daily x4  days.  As hospitalization progressed, the patient's mental status  improved.  He became less depressed and less anxious.  He had a family  session with his father.  The patient reported he had improved because  he is not hearing voices.  Father says his main concern is the woman in  the patient's life who was causing him stress.  The patient acknowledges  this.  He stated, however, he could not decide whether in the  relationship or not.  Father was worried about the patient's isolation.  The patient was resistant to the idea of community support services.  The father was very supportive.  On February 14, 2008, mental status had  improved markedly from admission status.  Sleep was good.  Appetite was  good.  Mood was less depressed and less anxious.   Affect, consistent  with mood.  There was no suicidal or homicidal ideation.  No thoughts of  self-injurious behavior.  No auditory or visual hallucinations.  No  paranoia or delusions.  Thoughts were logical and goal-directed.  Thought content, no predominant theme.  Cognitive was grossly back to  baseline.  It was felt the patient was safe for discharge.  Physically,  he felt much better.   DISCHARGE DIAGNOSES:  Axis I:  Schizophrenia, undifferentiated type.  Axis II:  None.  Axis III:  None known.  Axis IV:  Moderate (problems with primary, social support, burden of  psychiatric illness).  Axis V:  Global assessment of functioning was 50 upon discharge.  GAF  was 30 upon admission.  GAF highest past year was 65.   DISCHARGE PLANS:  There was no specific activity level or dietary  restrictions.   POSTHOSPITAL CARE PLANS:  The patient will be seen at the [facility] on March 06, 2008, at 1:30 p.m.  He will also be seen by Dr.  Joni Reining on February 22, 2008, at 8:30 a.m.   DISCHARGE MEDICATIONS:  1. Patanol eye drops 1 drop twice a day as needed for itching eyes.  2. Azithromycin 250 mg, take for 3 more days until finished.  3. MiraLax 17 g daily for constipation.  4. Colace 100 mg daily.  5. Protonix 40 mg daily.  6. Haldol 20 mg at bedtime.  7. Cogentin 1 mg twice a day.   He was also instructed to see his medical doctor at Urgent Care if the  cough worsens or any of his other medical problems worsen.      Jasmine Pang, M.D.  Electronically Signed     [facility]/MEDQ  D:  03/16/2008  T:  03/17/2008  Job:  [id number]

## 2011-08-09 LAB — URINALYSIS, ROUTINE W REFLEX MICROSCOPIC
Bilirubin Urine: NEGATIVE
Glucose, UA: NEGATIVE
Hgb urine dipstick: NEGATIVE
Nitrite: NEGATIVE
Specific Gravity, Urine: 1.017
pH: 6

## 2011-08-09 LAB — BASIC METABOLIC PANEL
CO2: 25
Calcium: 9.4
Chloride: 102
Chloride: 105
Creatinine, Ser: 0.87
GFR calc Af Amer: >60
GFR calc Af Amer: >60
Potassium: 3.9
Potassium: 4.2
Sodium: 138

## 2011-08-09 LAB — CBC
HCT: 38.1 — ABNORMAL LOW
HCT: 38.9 — ABNORMAL LOW
Hemoglobin: 13.6
MCHC: 34.6
MCHC: 34.9
MCV: 87.4
Platelets: 257
Platelets: 256
RBC: 4.36
RBC: 4.46
RDW: 13.1
WBC: 12.6 — ABNORMAL HIGH

## 2011-08-09 LAB — DIFFERENTIAL
Basophils Absolute: 0
Eosinophils Absolute: 0.1
Lymphs Abs: 0.3 — ABNORMAL LOW
Lymphs Abs: 1.1
Monocytes Relative: 6
Monocytes Relative: 9
Neutrophils Relative %: 85 — ABNORMAL HIGH

## 2011-08-09 LAB — LIPASE, BLOOD: Lipase: 29

## 2011-08-09 LAB — HEPATIC FUNCTION PANEL
ALT: 16
Alkaline Phosphatase: 53
Bilirubin, Direct: 0.1
Indirect Bilirubin: 0.4

## 2011-08-09 LAB — RAPID URINE DRUG SCREEN, HOSP PERFORMED
Barbiturates: NOT DETECTED
Cocaine: NOT DETECTED
Opiates: NOT DETECTED

## 2011-08-16 LAB — DIFFERENTIAL
Basophils Absolute: 0
Eosinophils Absolute: 0.2
Eosinophils Relative: 3
Lymphocytes Relative: 39
Neutrophils Relative %: 48

## 2011-08-16 LAB — CBC
HCT: 41.7
Platelets: 290
RDW: 13
WBC: 6.4

## 2011-08-16 LAB — RAPID URINE DRUG SCREEN, HOSP PERFORMED
Amphetamines: NOT DETECTED
Benzodiazepines: NOT DETECTED
Cocaine: NOT DETECTED

## 2011-08-16 LAB — POCT I-STAT, CHEM 8
BUN: 8
Hemoglobin: 14.6
Potassium: 3.9
Sodium: 139
TCO2: 25

## 2011-08-16 LAB — URINALYSIS, ROUTINE W REFLEX MICROSCOPIC
Bilirubin Urine: NEGATIVE
Glucose, UA: NEGATIVE
Hgb urine dipstick: NEGATIVE
Ketones, ur: NEGATIVE
pH: 6

## 2011-08-16 LAB — TSH: TSH: 1.01

## 2011-08-16 LAB — LIPID PANEL
HDL: 88
LDL Cholesterol: 95
Triglycerides: 98

## 2011-08-16 LAB — HEMOGLOBIN A1C: Mean Plasma Glucose: 117

## 2011-08-16 LAB — ETHANOL: Alcohol, Ethyl (B): 11 — ABNORMAL HIGH

## 2011-11-10 ENCOUNTER — Encounter: Payer: Self-pay | Admitting: *Deleted

## 2011-11-10 ENCOUNTER — Emergency Department (EMERGENCY_DEPARTMENT_HOSPITAL)
Admission: EM | Admit: 2011-11-10 | Discharge: 2011-11-11 | Disposition: A | Discharge status: Other Acute Inpatient Hospital | Payer: Medicare Other | Source: Home / Self Care | Attending: Emergency Medicine | Admitting: Emergency Medicine

## 2011-11-10 DIAGNOSIS — F29 Unspecified psychosis not due to a substance or known physiological condition: Secondary | ICD-10-CM

## 2011-11-10 HISTORY — DX: Depression, unspecified: F32.A

## 2011-11-10 HISTORY — DX: Paranoid schizophrenia: F20.0

## 2011-11-10 HISTORY — DX: Major depressive disorder, single episode, unspecified: F32.9

## 2011-11-10 LAB — COMPREHENSIVE METABOLIC PANEL
Albumin: 4.4 g/dL (ref 3.5–5.2)
Alkaline Phosphatase: 58 U/L (ref 39–117)
BUN: 14 mg/dL (ref 6–23)
Potassium: 3.9 mEq/L (ref 3.5–5.1)
Sodium: 135 mEq/L (ref 135–145)
Total Protein: 8.6 g/dL — ABNORMAL HIGH (ref 6.0–8.3)

## 2011-11-10 LAB — RAPID URINE DRUG SCREEN, HOSP PERFORMED
Amphetamines: NOT DETECTED
Benzodiazepines: NOT DETECTED
Cocaine: NOT DETECTED
Opiates: NOT DETECTED
Tetrahydrocannabinol: NOT DETECTED

## 2011-11-10 LAB — CBC
MCH: 30.7 pg (ref 26.0–34.0)
MCHC: 34.2 g/dL (ref 30.0–36.0)
Platelets: 325 10*3/uL (ref 150–400)

## 2011-11-10 LAB — DIFFERENTIAL
Basophils Relative: 0 % (ref 0–1)
Eosinophils Absolute: 0.2 10*3/uL (ref 0.0–0.7)
Monocytes Relative: 8 % (ref 3–12)
Neutrophils Relative %: 56 % (ref 43–77)

## 2011-11-10 LAB — ETHANOL: Alcohol, Ethyl (B): <11 mg/dL (ref 0–11)

## 2011-11-10 MED ORDER — ZIPRASIDONE MESYLATE 20 MG IM SOLR: 10.0000 mg | Freq: Once | INTRAMUSCULAR | Status: DC

## 2011-11-10 MED ORDER — ONDANSETRON HCL 4 MG PO TABS: 4.0000 mg | ORAL_TABLET | Freq: Three times a day (TID) | ORAL | Status: DC | PRN

## 2011-11-10 NOTE — ED Provider Notes (Signed)
History     CSN: [id number]  Arrival date & time 11/10/11  1801   First MD Initiated Contact with Patient 11/10/11 1928      Chief Complaint  Patient presents with  . V70.1    (Consider location/radiation/quality/duration/timing/severity/associated sxs/prior treatment) HPI This 8 rolled male with history of schizophrenia now presents with one week of increasing auditory hallucination and suicidal ideation. He notes that prior to this the he has not had similar episodes in a long time. Symptoms began without clear precipitant, and has been persistent with no clear alleviating factors. During this time the patient denies any pain, any change in medications, any illicit drug use. Past Medical History  Diagnosis Date  . Paranoid schizophrenia     History reviewed. No pertinent past surgical history.  No family history on file.  History  Substance Use Topics  . Smoking status: Current Everyday Smoker -- 0.5 packs/day  . Smokeless tobacco: Not on file  . Alcohol Use: No      Review of Systems  Constitutional:       Per HPI, otherwise negative  HENT:       Per HPI, otherwise negative  Eyes: Negative.   Respiratory:       Per HPI, otherwise negative  Cardiovascular:       Per HPI, otherwise negative  Gastrointestinal: Negative for vomiting.  Genitourinary: Negative.   Musculoskeletal:       Per HPI, otherwise negative  Skin: Negative.   Neurological: Negative for syncope.    Allergies  Review of patient's allergies indicates no known allergies.  Home Medications   Current Outpatient Rx  Name Route Sig Dispense Refill  . RISPERIDONE 1 MG PO TABS Oral Take 1 mg by mouth at bedtime.        BP 132/79  Pulse 67  Temp(Src) 98.9 F (37.2 C) (Oral)  Resp 18  Wt 165 lb (74.844 kg)  SpO2 100%  Physical Exam  Nursing note and vitals reviewed. Constitutional: He is oriented to person, place, and time. He appears well-developed. No distress.  HENT:  Head:  Normocephalic and atraumatic.  Eyes: Conjunctivae and EOM are normal.  Cardiovascular: Normal rate and regular rhythm.   Pulmonary/Chest: Effort normal. No stridor. No respiratory distress.  Abdominal: He exhibits no distension.  Musculoskeletal: He exhibits no edema.  Neurological: He is alert and oriented to person, place, and time.  Skin: Skin is warm and dry.  Psychiatric: He has a normal mood and affect. His speech is normal. Judgment normal. He is slowed and actively hallucinating. Cognition and memory are normal. He expresses suicidal ideation. He expresses no suicidal plans.    ED Course  Procedures (including critical care time)   Labs Reviewed  CBC  DIFFERENTIAL  URINE RAPID DRUG SCREEN (HOSP PERFORMED)  COMPREHENSIVE METABOLIC PANEL  ETHANOL   No results found.   No diagnosis found.    MDM  This 45 year old male with history of schizophrenia now presents with persistent auditory hallucinations and paranoid thought patterns. The patient also endorses suicidal thoughts, though has no clear plan. The patient is medically clear for psychiatric evaluation        Gerhard Munch, MD 11/10/11 [phone]

## 2011-11-10 NOTE — ED Notes (Signed)
Pt with 3 bags of belongings

## 2011-11-10 NOTE — ED Notes (Signed)
Pt states he has been off his pys medication and he is hearing voice. Pt states he is si denies hi

## 2011-11-10 NOTE — ED Notes (Signed)
Pt states "I've been hearing voices x 4 days, last time I went to [facility], can't understand what the voices are saying, they are scrambled, trying to figure out what they are saying"

## 2011-11-10 NOTE — ED Notes (Signed)
Dr. Jeraldine Loots informed need screening.

## 2011-11-11 ENCOUNTER — Encounter (HOSPITAL_COMMUNITY): Payer: Self-pay | Admitting: Licensed Clinical Social Worker

## 2011-11-11 ENCOUNTER — Encounter (HOSPITAL_COMMUNITY): Payer: Self-pay | Admitting: *Deleted

## 2011-11-11 ENCOUNTER — Inpatient Hospital Stay (HOSPITAL_COMMUNITY)
Admission: AD | Admit: 2011-11-11 | Discharge: 2011-11-12 | DRG: 885 | Disposition: A | Discharge status: Home / Self Care | Payer: Medicare Other | Source: Ambulatory Visit | Attending: Psychiatry | Admitting: Psychiatry

## 2011-11-11 DIAGNOSIS — F29 Unspecified psychosis not due to a substance or known physiological condition: Secondary | ICD-10-CM

## 2011-11-11 DIAGNOSIS — F200 Paranoid schizophrenia: Principal | ICD-10-CM

## 2011-11-11 DIAGNOSIS — Z79899 Other long term (current) drug therapy: Secondary | ICD-10-CM

## 2011-11-11 DIAGNOSIS — F329 Major depressive disorder, single episode, unspecified: Secondary | ICD-10-CM

## 2011-11-11 DIAGNOSIS — F3289 Other specified depressive episodes: Secondary | ICD-10-CM

## 2011-11-11 MED ORDER — BENZTROPINE MESYLATE 2 MG PO TABS
2.0000 mg | ORAL_TABLET | Freq: Once | ORAL | Status: AC
  Administered 2011-11-11: 2 mg via ORAL
  Filled 2011-11-11: qty 1
  Filled 2011-11-11: qty 2

## 2011-11-11 MED ORDER — BENZTROPINE MESYLATE 1 MG PO TABS
1.0000 mg | ORAL_TABLET | Freq: Two times a day (BID) | ORAL | Status: DC
  Administered 2011-11-11: 1 mg via ORAL
  Filled 2011-11-11: qty 1

## 2011-11-11 MED ORDER — RISPERIDONE 2 MG PO TABS
2.0000 mg | ORAL_TABLET | Freq: Once | ORAL | Status: AC
  Administered 2011-11-11: 2 mg via ORAL
  Filled 2011-11-11: qty 1

## 2011-11-11 MED ORDER — ZIPRASIDONE MESYLATE 20 MG IM SOLR: 10.0000 mg | Freq: Once | INTRAMUSCULAR | Status: DC | PRN

## 2011-11-11 MED ORDER — ALUM & MAG HYDROXIDE-SIMETH 200-200-20 MG/5ML PO SUSP
30.0000 mL | ORAL | Status: DC | PRN
  Administered 2011-11-12: 30 mL via ORAL

## 2011-11-11 MED ORDER — MAGNESIUM HYDROXIDE 400 MG/5ML PO SUSP: 30.0000 mL | Freq: Every day | ORAL | Status: DC | PRN

## 2011-11-11 MED ORDER — ALUM & MAG HYDROXIDE-SIMETH 200-200-20 MG/5ML PO SUSP: 30.0000 mL | ORAL | Status: DC | PRN

## 2011-11-11 MED ORDER — ACETAMINOPHEN 325 MG PO TABS
650.0000 mg | ORAL_TABLET | Freq: Four times a day (QID) | ORAL | Status: DC | PRN
  Administered 2011-11-11: 650 mg via ORAL

## 2011-11-11 MED ORDER — NICOTINE 21 MG/24HR TD PT24
21.0000 mg | MEDICATED_PATCH | Freq: Once | TRANSDERMAL | Status: DC
  Administered 2011-11-11: 21 mg via TRANSDERMAL
  Filled 2011-11-11: qty 1

## 2011-11-11 MED ORDER — RISPERIDONE 1 MG PO TABS
1.0000 mg | ORAL_TABLET | Freq: Two times a day (BID) | ORAL | Status: DC
  Administered 2011-11-11: 1 mg via ORAL
  Filled 2011-11-11: qty 1

## 2011-11-11 MED ORDER — ACETAMINOPHEN 325 MG PO TABS: 650.0000 mg | ORAL_TABLET | Freq: Four times a day (QID) | ORAL | Status: DC | PRN

## 2011-11-11 NOTE — ED Notes (Signed)
Care assumed, pt resting in bed with eyes closed

## 2011-11-11 NOTE — ED Notes (Signed)
Pt in room sitting on bed in dark, reports that he is still having suicidal thoughts/AH, CFS explained to pt and pt states he is able to CFS at this time, pt reports he is hearing voices however he is unable to make out what the voices are saying which is frustrating him and the reason he is not watching the television. Pt reports voices are making him paranoid and although he knows that people coming in his room are not going to hurt him the voices make him feel like they are. Pt pleasant, cooperative, expresses wanting to start back on his medications.

## 2011-11-11 NOTE — BH Assessment (Signed)
Patient arrived to [facility] 11/10/2011 @ 18:01. Patient has remained here at [facility] and moved to the Psych ED this am. No information regarding this patient given to [facility] or LCSW staff. Due to patient being moved to the the psych ED contacted the EDP (Dr. Juleen China) and asked if patient needs to be evaluated. Dr. Juleen China sts, "I'm not sure .Marland KitchenI will need to review patients chart and call you back". As of 11/12/2011 @ 1011 this patient has no plan of care of pending dispositions.

## 2011-11-11 NOTE — BH Assessment (Signed)
Assessment Note Received a message that was from [place] in the assessment office stating that patient may be admitted after 3pm. Per note, patients bed assignment will be given at that time.

## 2011-11-11 NOTE — Consult Note (Signed)
Patient Identification:  Damon [name] Date of Evaluation:  11/11/2011   History of Present Illness:  Patient seen and assessment reviewed. Patient reported hearing voices multiple voices but unable to determine what the voices are telling him but he is confused and is frustrated because of the voices and he told me that he don't know what to do when somebody approaches like he told me I was approaching towards him and he don't know what to do and what the voices are telling him whether to hurt me or not. Patient reported that she is noncompliant with medications for the last few months . He denied use of any drugs.  He is very calm cooperative and pleasant not agitated during the interview willing to be started on the medication and to be admitted in the hospital for further stabilization.  PHYSICIAN:  Anselm Jungling, MD  DATE OF BIRTH:  Mar 05, 1966  DATE OF ADMISSION:  12/10/2009 DATE OF DISCHARGE:  12/16/2009                              DISCHARGE SUMMARY  IDENTIFYING DATA AND REASON FOR ADMISSION:  This was an inpatient psychiatric admission for Damon [name], a 45 year old single African American male who presented with signs and symptoms of chronic schizophrenia, acute exacerbation.  Please refer to the admission note for further details pertaining to the symptoms, circumstances and history that led to his hospitalization.  MEDICAL AND LABORATORY:  The patient was medically and physically assessed by the psychiatric nurse practitioner.  He was in good health, but undernourished, as he had not been eating.  There were no acute or chronic medical issues of significance during this stay.  HOSPITAL COURSE:  The patient was admitted to the adult inpatient psychiatric service.  He presented as a very slender, but normally- developed, [place] American male who was pleasant and cooperative.  He reported auditory hallucinations that were telling him not to eat.  His mood was somewhat  depressed.  He was agreeable to treatment though and recognized his illness.  As such, he was felt to have good insight and judgment.  He was restarted on a regimen of Haldol, but this produced extrapyramidal symptoms.  Because of this, Haldol was discontinued in favor of a trial of Risperdal, which was given along with Cogentin, and well tolerated.  The patient was a good participant in treatment program, pleasant and cooperative throughout.  He worked closely with case management towards an aftercare plan.  On the seventh hospital day he appeared appropriate for discharge.  He agreed to the following aftercare plan.  AFTERCARE:  The patient was to follow-up at [facility] with an appointment on February 9 at 1:30 p.m.  DISCHARGE MEDICATIONS: 1. Risperdal 2 mg q.h.s. 2. Cogentin 2 mg q.h.s.  DISCHARGE DIAGNOSES:  AXIS I: Schizophrenia, chronic undifferentiated type, acute exacerbation, resolving. AXIS II: Deferred. AXIS III: No acute or chronic illnesses. AXIS IV: Stressors severe.    Past Medical History:     Past Medical History  Diagnosis Date  . Paranoid schizophrenia   . Depression       History reviewed. No pertinent past surgical history.  Filed Vitals:   11/11/11 1152  BP: 121/86  Pulse: 62  Temp: 98.1 F (36.7 C)  Resp: 18    Lab Results:   BMET    Component Value Date/Time   NA 135 11/10/2011 1845   K 3.9 11/10/2011 1845  CL 99 11/10/2011 1845   CO2 29 11/10/2011 1845   GLUCOSE 88 11/10/2011 1845   BUN 14 11/10/2011 1845   CREATININE 0.94 11/10/2011 1845   CALCIUM 10.2 11/10/2011 1845   GFRNONAA >90 11/10/2011 1845   GFRAA >90 11/10/2011 1845    Allergies: No Known Allergies  Current Medications:  Prior to Admission medications   Medication Sig Start Date End Date Taking? Authorizing Provider  risperiDONE (RISPERDAL) 1 MG tablet Take 1 mg by mouth at bedtime.     Yes Historical Provider, MD    Social History:     reports that he has been smoking.  He does not have any smokeless tobacco history on file. He reports that he does not drink alcohol or use illicit drugs.   Family History:    No family history on file.   DIAGNOSIS:   AXIS I  schizophrenia paranoid type   AXIS II  Deffered  AXIS III See medical notes.  AXIS IV  noncompliance with medications   AXIS V 35     Recommendations:  Patient started on Risperdal and Cogentin and will be admitted in the inpatient setting for further stabilization.   Eulogio Ditch, MD

## 2011-11-11 NOTE — BH Assessment (Signed)
Assessment Note   Damon [name] is an 45 y.o. male. Patient presents to [facility] with complaints of auditory hallucinations. He describes his hallucinations as auditory and muffled voices. Patient is unable to make out the voices and sts he is fustrated and "fears they are trying to warn him of something".He also reports visual hallucinations of "demons". He feels that the demons are trying to touch him and possible harm him.   Patient also suicidal without a plan. Sts that his suicidal thoughts are triggered by his AVH's. He has a hx of 1 attempt approx. 1 yr ago; triggered by [facility].   Patient denies HI.  Pt denies alcohol and/or drug use.  Patient admitted to [facility] 1 yr ago for [facility], and suicidal thoughts. He also had another in-pt admission at [facility] "yrs ago". He sts that he was diagnosed with Schizophrenia at that time.   Axis I: Psychotic Disorder NOS Axis II: Deferred Axis III:  Past Medical History  Diagnosis Date  . Paranoid schizophrenia   . Depression    Axis IV: economic problems, other psychosocial or environmental problems, problems related to social environment, problems with access to health care services and problems with primary support group Axis V: 31-40 impairment in reality testing  Past Medical History:  Past Medical History  Diagnosis Date  . Paranoid schizophrenia   . Depression     History reviewed. No pertinent past surgical history.  Family History: No family history on file.  Social History:  reports that he has been smoking.  He does not have any smokeless tobacco history on file. He reports that he does not drink alcohol or use illicit drugs.  Additional Social History:  Alcohol / Drug Use Pain Medications: patient denies Prescriptions: patient denies Over the Counter: patient denies History of alcohol / drug use?: No history of alcohol / drug abuse Longest period of sobriety (when/how long): n/a Allergies: No Known Allergies  Home  Medications:  Medications Prior to Admission  Medication Dose Route Frequency Provider Last Rate Last Dose  . benztropine (COGENTIN) tablet 1 mg  1 mg Oral BID Katheren Puller, MD   1 mg at 11/11/11 1337  . ondansetron (ZOFRAN) tablet 4 mg  4 mg Oral Q8H PRN Gerhard Munch, MD      . risperiDONE (RISPERDAL) tablet 1 mg  1 mg Oral BID Katheren Puller, MD   1 mg at 11/11/11 1337  . ziprasidone (GEODON) injection 10 mg  10 mg Intramuscular Once PRN Gerhard Munch, MD      . DISCONTD: ziprasidone (GEODON) injection 10 mg  10 mg Intramuscular Once Gerhard Munch, MD       No current outpatient prescriptions on file as of 11/10/2011.    OB/GYN Status:  No LMP for male patient.  General Assessment Data Location of Assessment: [facility] ED ACT Assessment:  (No) Living Arrangements: Other (Comment);Alone (lives alone in a apt.) Can pt return to current living arrangement?: Yes Admission Status: Voluntary Is patient capable of signing voluntary admission?: Yes Transfer from: Acute Hospital Referral Source: Self/Family/Friend  Education Status Is patient currently in school?: No  Risk to self Suicidal Ideation: Yes-Currently Present Suicidal Intent: Yes-Currently Present Is patient at risk for suicide?: Yes Suicidal Plan?: No Access to Means: No What has been your use of drugs/alcohol within the last 12 months?:  (pt denies alcohol and/or drug use.) Previous Attempts/Gestures: Yes How many times?:  (1x (1 yr ago)) Other Self Harm Risks:  (none reported) Triggers  for Past Attempts: Other (Comment) (psyhotic symptoms (aud. & visual halluc's)) Intentional Self Injurious Behavior: None Family Suicide History: Yes (mother, sister, uncle all diagnosed with paranoid schizophre) Recent stressful life event(s): Loss (Comment) (mother died this yr on Mothers day,uncle passed away) Persecutory voices/beliefs?: No Depression: Yes Depression Symptoms: Isolating;Loss of interest in usual  pleasures;Insomnia;Despondent Substance abuse history and/or treatment for substance abuse?: No (no hx of substance abuse treatment) Suicide prevention information given to non-admitted patients: Not applicable  Risk to Others Homicidal Ideation: No Thoughts of Harm to Others: No Current Homicidal Intent: No Current Homicidal Plan: No Access to Homicidal Means: No Identified Victim:  (n/a) History of harm to others?: No Assessment of Violence: None Noted Violent Behavior Description:  (n/a) Does patient have access to weapons?: No Criminal Charges Pending?: No Does patient have a court date: No  Psychosis Hallucinations: Auditory;Visual;Tactile (hears voices that are muffled,see's demons,feels demons touc) Delusions: Unspecified (paranoid that demons are after him or trying to tell him som)  Mental Status Report Appear/Hygiene: Improved (normal) Eye Contact: Fair Motor Activity: Other (Comment) (paronoid; pt looking around the room as if he hears voices) Speech: Logical/coherent;Other (Comment) (normal) Level of Consciousness: Alert Mood: Anxious;Preoccupied Affect: Preoccupied Anxiety Level: Minimal Thought Processes: Relevant Judgement: Unimpaired Orientation: Person;Place;Time;Situation Obsessive Compulsive Thoughts/Behaviors: None  Cognitive Functioning Concentration: Decreased Memory: Recent Intact;Remote Intact IQ: Average Insight: Fair Impulse Control: Fair Appetite: Fair Weight Loss:  (5Ibs in the past month) Weight Gain:  (n/a) Sleep: Decreased (pt not sleeping at night; sleeps during the day only) Total Hours of Sleep:  (4-8 hrs during the day time only) Vegetative Symptoms: None  Prior Inpatient Therapy Prior Inpatient Therapy: Yes Prior Therapy Dates:  (1 yr ago and several yrs ago) Prior Therapy Facilty/Provider(s):  ([facility] and [facility]) Reason for Treatment:  (Suicidal and psychotic symtpoms on both admissions)  Prior Outpatient Therapy Prior  Outpatient Therapy: Yes Prior Therapy Dates:  (last seen at [facility] 3 months ago) Prior Therapy Facilty/Provider(s):  ([facility] ) Reason for Treatment:  (med mgt only for schizophrenia diagnosis)  ADL Screening (condition at time of admission) Patient's cognitive ability adequate to safely complete daily activities?: Yes Patient able to express need for assistance with ADLs?: Yes Independently performs ADLs?: Yes Weakness of Legs: None Weakness of Arms/Hands: None  Home Assistive Devices/Equipment Home Assistive Devices/Equipment: None    Abuse/Neglect Assessment (Assessment to be complete while patient is alone) Physical Abuse: Denies Verbal Abuse: Denies Sexual Abuse: Denies Exploitation of patient/patient's resources: Denies Self-Neglect: Denies Values / Beliefs Cultural Requests During Hospitalization: None Spiritual Requests During Hospitalization: None Consults Spiritual Care Consult Needed: No Social Work Consult Needed: No Merchant navy officer (For Healthcare) Advance Directive: Patient does not have advance directive Nutrition Screen Diet: Regular Unintentional weight loss greater than 10lbs within the last month:  (pt reports loosing 5Ibs in the past month.) Dysphagia: No Home Tube Feeding or Total Parenteral Nutrition (TPN): No Patient appears severely malnourished: No Pregnant or Lactating: No  Additional Information 1:1 In Past 12 Months?: No CIRT Risk: No Elopement Risk: No Does patient have medical clearance?: Yes     Disposition:  Disposition Disposition of Patient: Inpatient treatment program Type of inpatient treatment program: Adult  Patient accepted by Dr. Rogers Blocker pending a room assignment.   On Site Evaluation by:   Reviewed with Physician:     Melynda Ripple [facility] 11/11/2011 1:43 PM

## 2011-11-11 NOTE — ED Notes (Signed)
Act in to speak with pt 

## 2011-11-11 NOTE — ED Notes (Signed)
MD in to speak with pt

## 2011-11-11 NOTE — BH Assessment (Signed)
Informed by Dr. Juleen China that he would like patient assessed and disposition for Behavioral health services.
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
## 2011-11-12 DIAGNOSIS — F200 Paranoid schizophrenia: Secondary | ICD-10-CM | POA: Diagnosis present

## 2011-11-12 DIAGNOSIS — F209 Schizophrenia, unspecified: Secondary | ICD-10-CM

## 2011-11-12 MED ORDER — BENZTROPINE MESYLATE 2 MG PO TABS: 2.0000 mg | ORAL_TABLET | Freq: Every day | ORAL | Status: DC

## 2011-11-12 MED ORDER — RISPERIDONE 2 MG PO TABS
2.0000 mg | ORAL_TABLET | Freq: Every day | ORAL | Status: DC
  Filled 2011-11-12: qty 1
  Filled 2011-11-12: qty 14

## 2011-11-12 MED ORDER — BENZTROPINE MESYLATE 2 MG PO TABS
2.0000 mg | ORAL_TABLET | Freq: Every day | ORAL | Status: DC
  Filled 2011-11-12: qty 1
  Filled 2011-11-12: qty 14

## 2011-11-12 MED ORDER — RISPERIDONE 2 MG PO TABS: 2.0000 mg | ORAL_TABLET | Freq: Every day | ORAL | Status: DC

## 2011-11-12 NOTE — Progress Notes (Signed)
[facility] Group Notes:  (Counselor/Nursing/MHT/Case Management/Adjunct)  11/12/2011 2:33 PM  Type of Therapy:  Group Therapy  Participation Level:  Active  Participation Quality:  Attentive and Sharing  Affect:  Appropriate  Cognitive:  Oriented  Insight:  Good  Engagement in Group:  Good  Engagement in Therapy:  Good  Modes of Intervention:  Clarification, Education and Support  Summary of Progress/Problems: Patient stated he is hoping for discharge. He stated he felt better. No suicidal thoughts, not hearing voices. Plans to stay on medications and get a pill box to keep up with medications.   Damon [name] 11/12/2011, 2:33 PM

## 2011-11-12 NOTE — Progress Notes (Signed)
Pt reported that the voices are not as loud as they were and he feels he is doing much better  He is cooperaticve and interacts minimally with others but is appropriate   Verbal support given  Medications administered and effectiveness monitored   Q 15 min checks  Pt safe at present

## 2011-11-12 NOTE — Progress Notes (Signed)
[facility] Adult Inpatient Family/Significant Other Suicide Prevention Education  Suicide Prevention Education:  Education Completed; Junious Dresser and Concepcion Elk (parents) have been identified by the patient as the family member/significant other with whom the patient will be residing, and identified as the person(s) who will aid the patient in the event of a mental health crisis (suicidal ideations/suicide attempt).  With written consent from the patient, the family member/significant other has been provided the following suicide prevention education, prior to the and/or following the discharge of the patient.  The suicide prevention education provided includes the following:  Suicide risk factors  Suicide prevention and interventions  National Suicide Hotline telephone number  [facility] assessment telephone number  [facility] Emergency Assistance 911  [facility] and/or Residential Mobile Crisis Unit telephone number  Request made of family/significant other to:  Remove weapons (e.g., guns, rifles, knives), all items previously/currently identified as safety concern.    Remove drugs/medications (over-the-counter, prescriptions, illicit drugs), all items previously/currently identified as a safety concern.  The family member/significant other verbalizes understanding of the suicide prevention education information provided.  The family member/significant other agrees to remove the items of safety concern listed above. No safety concerns. Father has secured guns. Patient does not have access.  HartisAram Beecham 11/12/2011, 12:51 PM

## 2011-11-12 NOTE — Progress Notes (Signed)
Patient ID: Damon [name], male   DOB: [birth year], 45 y.o.   MRN: [id number] 122/28/2012 Nursing [address] Damon [name] is given his DC instructions. He is given a bus ticket ( for his transportation home today), along with medication samples from the pharmacy. HE signed the consent for release of info and his belongings were returned to him as per policy. A he denies HI, SI, and / or  Presence of audit, vis, tactile haluc. He stated he understood and that he would comply. R Safety is maintaiend and POC includes escorting pt to blidg entrance where he is dc'd to home per MD order. PD RN [facility]

## 2011-11-12 NOTE — Progress Notes (Signed)
[facility] Adult Inpatient Family/Significant Other Suicide Prevention Education  Suicide Prevention Education:  Education Completed; Chozen Latulippe Sr. (patient's father ) [phone]) has been identified by the patient as the family member/significant other with whom the patient will be residing, and identified as the person(s) who will aid the patient in the event of a mental health crisis (suicidal ideations/suicide attempt).  With written consent from the patient, the family member/significant other has been provided the following suicide prevention education, prior to the and/or following the discharge of the patient.  The suicide prevention education provided includes the following:  Suicide risk factors  Suicide prevention and interventions  National Suicide Hotline telephone number  [facility] assessment telephone number  [facility] Emergency Assistance 911  [facility] and/or Residential Mobile Crisis Unit telephone number  Request made of family/significant other to:  Remove weapons (e.g., guns, rifles, knives), all items previously/currently identified as safety concern.    Remove drugs/medications (over-the-counter, prescriptions, illicit drugs), all items previously/currently identified as a safety concern.  The family member/significant other verbalizes understanding of the suicide prevention education information provided.  The family member/significant other agrees to remove the items of safety concern listed above. Talked to patient's father. He did not know he was in the hospital. He had seen him recently and stated he looked good. Had no safety concerns. Stated that he would check up on him when he has had a chance to get home.  HartisAram Beecham 11/12/2011, 12:47 PM

## 2011-11-12 NOTE — Progress Notes (Signed)
Suicide Risk Assessment  Admission Assessment     Demographic factors:  Assessment Details Time of Assessment: Admission Information Obtained From: Patient Current Mental Status:  Current Mental Status:  (No SI & HI, contracts for safety) Loss Factors:  Loss Factors: Loss of significant relationship;Financial problems / change in socioeconomic status Historical Factors:  Historical Factors: Prior suicide attempts;Family history of mental illness or substance abuse;Anniversary of important loss;Domestic violence in family of origin Risk Reduction Factors:  Risk Reduction Factors: Responsible for children under 71 years of age;Sense of responsibility to family;Religious beliefs about death  CLINICAL FACTORS:   Schizophrenia:   Paranoid or undifferentiated type  Diagnosis:  Axis I: Schizophrenia - Paranoid Type.   The patient was seen today and reports the following:   Sleep: The patient reports to sleeping well without difficulty.  Appetite: The patient reports a good appetite.   Mild>(1-10) >Severe  Hopelessness (1-10): 0  Depression (1-10): 0  Anxiety (1-10): 0   Suicidal Ideation: The patient adamantly denies any suicidal ideations today.  Plan: No  Intent: No  Means: No   Homicidal Ideation: The patient adamantly denies any homicidal ideations today.  Plan: No  Intent: No.  Means: No   Eye Contact: Good.  General Appearance Damon [name]: Neat and Clean today and alert.  Motor Behavior: Appropriate.  Speech: Appropriate in rate and volume with no pressuring noted.  Mental Status: AO x 3 today.  Level of Consciousness: Alert and oriented x 3..  Mood: Euthymic.  Affect: Bright and Full.  Anxiety: None reported.  Thought Process: WNL.  Thought Content: No auditory or visual hallucinations reported or observed. No delusional thinking reported.  Perception: WNL today.  Judgment: Good.  Insight: Good.   The patient states that he feels he is "back to my old self" and is  requesting discharge today to outpatient follow up with [facility] next week.  Treatment Plan Summary:  1. Daily contact with patient to assess and evaluate symptoms and progress in treatment  2. Medication management  3. The patient will deny suicidal ideations or homicidal ideations for 48 hours prior to discharge and have a depression and anxiety rating of 3 or less. The patient will also deny any auditory or visual hallucinations or delusional thinking or display any manic or hypomanic behaviors.   Plan:  1. Will continue current medications.  2. Continue to monitor.  3. Discharge home today.  SUICIDE RISK:   Minimal: No identifiable suicidal ideation.  Patients presenting with no risk factors but with morbid ruminations; may be classified as minimal risk based on the severity of the depressive symptoms  Damon [name] 11/12/2011, 11:25 AM

## 2011-11-12 NOTE — Progress Notes (Signed)
Suicide Risk Assessment  Discharge Assessment     Demographic factors:  Assessment Details Time of Assessment: Admission Information Obtained From: Patient Current Mental Status:  Current Mental Status:  (No SI & HI, contracts for safety) Risk Reduction Factors:  Risk Reduction Factors: Responsible for children under 46 years of age;Sense of responsibility to family;Religious beliefs about death  CLINICAL FACTORS:   Schizophrenia:   Paranoid or undifferentiated type  No Cognitive Impairment noted.  Diagnosis:  Axis I: Schizophrenia - Paranoid Type.   The patient was seen today and reports the following:   Sleep: The patient reports to sleeping well without difficulty.  Appetite: The patient reports a good appetite.   Mild>(1-10) >Severe  Hopelessness (1-10): 0  Depression (1-10): 0  Anxiety (1-10): 0   Suicidal Ideation: The patient adamantly denies any suicidal ideations today.  Plan: No  Intent: No  Means: No   Homicidal Ideation: The patient adamantly denies any homicidal ideations today.  Plan: No  Intent: No.  Means: No   Eye Contact: Good.  General Appearance Damon [name]: Neat and Clean today and alert.  Motor Behavior: Appropriate.  Speech: Appropriate in rate and volume with no pressuring noted.  Mental Status: AO x 3 today.  Level of Consciousness: Alert and oriented x 3..  Mood: Euthymic.  Affect: Bright and Full.  Anxiety: None reported.  Thought Process: WNL.  Thought Content: No auditory or visual hallucinations reported or observed. No delusional thinking reported.  Perception: WNL today.  Judgment: Good.  Insight: Good.   The patient states that he feels he is "back to my old self" and is requesting discharge today to outpatient follow up with [facility] next week.  Treatment Plan Summary:  1. Daily contact with patient to assess and evaluate symptoms and progress in treatment  2. Medication management  3. The patient will deny suicidal ideations or  homicidal ideations for 48 hours prior to discharge and have a depression and anxiety rating of 3 or less. The patient will also deny any auditory or visual hallucinations or delusional thinking or display any manic or hypomanic behaviors.   Plan:  1. Will continue current medications.  2. Continue to monitor.  3. Discharge home today.  SUICIDE RISK:   Minimal: No identifiable suicidal ideation.  Patients presenting with no risk factors but with morbid ruminations; may be classified as minimal risk based on the severity of the depressive symptoms  Damon [name] 11/12/2011, 11:37 AM

## 2011-11-12 NOTE — Progress Notes (Signed)
Recreation Therapy Group Note  Date: 11/12/2011         Time: 1315       Group Topic/Focus: The focus of the group is on enhancing the patients' ability to cope with stressors by understanding what coping is, why it is important, the negative effects of stress and developing healthier coping skills. Patients practice [place] and discuss how exercise can be used as a healthy coping strategy.  Participation Level: Active  Participation Quality: Appropriate and Attentive  Affect: Appropriate  Cognitive: Oriented   Additional Comments: patient reports he is looking forward to discharge today, will visit the library to find similar relaxation exercises he can do upon discharge.   Damon [name] 11/12/2011 2:03 PM

## 2011-11-12 NOTE — Discharge Planning (Signed)
Pt to d/c today.  Sent with scripts, meds, bus pass, letter for work.  Follow up [facility].  All inpt goals met.

## 2011-11-12 NOTE — Discharge Summary (Signed)
Physician Discharge Summary Note  Patient:  Damon [name] is an 45 y.o., male DOB:  1966/07/11  Date of Admission:  11/11/2011  Date of Discharge:  11/12/11  Axis Diagnosis:   Axis [name]: Chronic Paranoid Schizophrenia Axis II: Deferred Axis III:  Past Medical History  Diagnosis Date  . Paranoid schizophrenia   . Depression    Axis IV: other psychosocial or environmental problems Axis V: 21-30 behavior considerably influenced by delusions or hallucinations OR serious impairment in judgment, communication OR inability to function in almost all areas  Level of Care:  OP  Discharge destination:  Home  Is patient on multiple antipsychotic therapies at discharge:  No    Has Patient had three or more failed trials of antipsychotic monotherapy by history:  No  Patient phone:  [phone] (home)  Patient address:   [name] [address],   Follow-up recommendations:  Out patient .  Comments:   Follow-up Information    Follow up with [facility].   Contact information:   [place]  [phone]         The patient received suicide prevention pamphlet:  Yes Belongings returned:  Clothing  Damon [name] 11/12/2011, 11:47 AM Patient ID: Damon [name] Damon [name] DOB/AGE: 29-Mar-1966 45 y.o.  Admit date: 11/11/2011 Discharge date: 11/12/2011  Admission Diagnoses: Paranoid schizophrenia.   Hospital Course: Damon [name] is a 45 year old African American male admitted from [facility] ED to [facility] with complaints of auditory and visual hallucinations. Patient reports "[name] came here because of my schizophrenia. [name] have had this illness since [name] was 45 years old. Prior to going to the [facility] ED, [name] was hearing bunch of voices in my head. [name] was seeing stuff too. The voices were very distracting, even though [name] could not make out what they were saying. It frustrated me and it caused me a lot of distress. [name] was in this hospital last year for the  same reason. Since coming in here, the voices are gone. [name] am doing fine now and [name] slept well last night". Patient denies any SIHI and or any signs and symptoms of depression The patient states that he feels he is "back to my old self" and is requesting discharge today to outpatient follow up with [facility] next week.      Discharge Diagnoses:  Principal Problem:  *Schizophrenia, paranoid type   Discharged Condition:   Eye Contact: Good.  General Appearance Damon [name]: Neat and Clean today and alert.  Motor Behavior: Appropriate.  Speech: Appropriate in rate and volume with no pressuring noted.  Mental Status: AO x 3 today.  Level of Consciousness: Alert and oriented x 3..  Mood: Euthymic.  Affect: Bright and Full.  Anxiety: None reported.  Thought Process: WNL.  Thought Content: No auditory or visual hallucinations reported or observed. No delusional thinking reported.  Perception: WNL today.  Judgment: Good.  Insight: Good.      Current Discharge Medication List    START taking these medications   Details  benztropine (COGENTIN) 2 MG tablet Take 1 tablet (2 mg total) by mouth at bedtime. Qty: 30 tablet, Refills: 0      CONTINUE these medications which have CHANGED   Details  risperiDONE (RISPERDAL) 2 MG tablet Take 1 tablet (2 mg total) by mouth at bedtime. Qty: 30 tablet, Refills: 0       Follow-up Information    Follow up with [facility].  Contact information:   [address] [place]  [phone]         Signed: Armandina Stammer [name] 11/12/2011, 11:48 AM

## 2011-11-12 NOTE — H&P (Addendum)
  Damon [name] is a 45 year old African American male admitted from [facility] to [facility] with complaints of auditory and visual hallucinations. Patient reports "I came here because of my schizophrenia. I have had this illness since I was 45 years old. Prior to going to the [facility] ED, I was hearing bunch of voices in my head. I was seeing stuff too. The voices were very distracting, even though I could not make out what they were saying. It frustrated me and it caused me a lot of distress. I was in this hospital last year for the same reason. Since coming in here, the voices are gone. I am doing fine now and I slept well last night". Patient denies any SIHI and or any signs and symptoms of depression.  Patient is discharged today. For further details, see discharge summary.

## 2011-11-15 NOTE — Progress Notes (Signed)
Patient Discharge Instructions:  After Visit Summary Faxed,  11/15/2011 Faxed to the [facility] Care provider:  11/15/2011 D/C Summary Note faxed 11/15/2011 Facesheet faxed 11/15/2011  Faxed to [facility] @ [phone]  Heloise Purpura Eduard Clos, 11/15/2011, 1:09 PM

## 2012-01-12 DIAGNOSIS — F29 Unspecified psychosis not due to a substance or known physiological condition: Secondary | ICD-10-CM | POA: Diagnosis not present

## 2012-01-27 DIAGNOSIS — F29 Unspecified psychosis not due to a substance or known physiological condition: Secondary | ICD-10-CM | POA: Diagnosis not present

## 2012-03-15 DIAGNOSIS — F29 Unspecified psychosis not due to a substance or known physiological condition: Secondary | ICD-10-CM | POA: Diagnosis not present

## 2012-07-27 DIAGNOSIS — F29 Unspecified psychosis not due to a substance or known physiological condition: Secondary | ICD-10-CM | POA: Diagnosis not present

## 2012-10-06 ENCOUNTER — Emergency Department (HOSPITAL_COMMUNITY)
Admission: EM | Admit: 2012-10-06 | Discharge: 2012-10-07 | Disposition: A | Discharge status: Other Acute Inpatient Hospital | Payer: Medicare Other | Source: Home / Self Care | Attending: Emergency Medicine | Admitting: Emergency Medicine

## 2012-10-06 ENCOUNTER — Encounter (HOSPITAL_COMMUNITY): Payer: Self-pay | Admitting: *Deleted

## 2012-10-06 DIAGNOSIS — F17200 Nicotine dependence, unspecified, uncomplicated: Secondary | ICD-10-CM | POA: Insufficient documentation

## 2012-10-06 DIAGNOSIS — H5316 Psychophysical visual disturbances: Secondary | ICD-10-CM | POA: Insufficient documentation

## 2012-10-06 DIAGNOSIS — F200 Paranoid schizophrenia: Secondary | ICD-10-CM

## 2012-10-06 DIAGNOSIS — F489 Nonpsychotic mental disorder, unspecified: Secondary | ICD-10-CM | POA: Insufficient documentation

## 2012-10-06 DIAGNOSIS — R51 Headache: Secondary | ICD-10-CM | POA: Insufficient documentation

## 2012-10-06 DIAGNOSIS — Z79899 Other long term (current) drug therapy: Secondary | ICD-10-CM | POA: Insufficient documentation

## 2012-10-06 DIAGNOSIS — G479 Sleep disorder, unspecified: Secondary | ICD-10-CM | POA: Insufficient documentation

## 2012-10-06 DIAGNOSIS — Z8659 Personal history of other mental and behavioral disorders: Secondary | ICD-10-CM | POA: Insufficient documentation

## 2012-10-06 LAB — CBC WITH DIFFERENTIAL/PLATELET
Basophils Absolute: 0 10*3/uL (ref 0.0–0.1)
HCT: 41.5 % (ref 39.0–52.0)
Hemoglobin: 14.5 g/dL (ref 13.0–17.0)
Lymphocytes Relative: 35 % (ref 12–46)
Monocytes Absolute: 0.7 10*3/uL (ref 0.1–1.0)
Monocytes Relative: 9 % (ref 3–12)
Neutro Abs: 4.1 10*3/uL (ref 1.7–7.7)
RBC: 4.68 MIL/uL (ref 4.22–5.81)
WBC: 7.7 10*3/uL (ref 4.0–10.5)

## 2012-10-06 MED ORDER — IBUPROFEN 200 MG PO TABS: 600.0000 mg | ORAL_TABLET | Freq: Three times a day (TID) | ORAL | Status: DC | PRN

## 2012-10-06 MED ORDER — RISPERIDONE 0.5 MG PO TABS
2.0000 mg | ORAL_TABLET | Freq: Every day | ORAL | Status: DC
  Administered 2012-10-07: 2 mg via ORAL
  Filled 2012-10-06: qty 4

## 2012-10-06 MED ORDER — ACETAMINOPHEN 325 MG PO TABS: 650.0000 mg | ORAL_TABLET | ORAL | Status: DC | PRN

## 2012-10-06 MED ORDER — IBUPROFEN 400 MG PO TABS
800.0000 mg | ORAL_TABLET | Freq: Once | ORAL | Status: AC
  Administered 2012-10-06: 800 mg via ORAL
  Filled 2012-10-06: qty 2

## 2012-10-06 MED ORDER — LORAZEPAM 1 MG PO TABS
1.0000 mg | ORAL_TABLET | Freq: Once | ORAL | Status: AC
  Administered 2012-10-06: 1 mg via ORAL
  Filled 2012-10-06: qty 2

## 2012-10-06 MED ORDER — NICOTINE 21 MG/24HR TD PT24
21.0000 mg | MEDICATED_PATCH | Freq: Every day | TRANSDERMAL | Status: DC
  Administered 2012-10-07: 21 mg via TRANSDERMAL
  Filled 2012-10-06: qty 1

## 2012-10-06 MED ORDER — BENZTROPINE MESYLATE 2 MG PO TABS
2.0000 mg | ORAL_TABLET | ORAL | Status: AC
  Administered 2012-10-07: 2 mg via ORAL
  Filled 2012-10-06 (×2): qty 1

## 2012-10-06 MED ORDER — ALUM & MAG HYDROXIDE-SIMETH 200-200-20 MG/5ML PO SUSP: 30.0000 mL | ORAL | Status: DC | PRN

## 2012-10-06 MED ORDER — ONDANSETRON HCL 8 MG PO TABS: 4.0000 mg | ORAL_TABLET | Freq: Three times a day (TID) | ORAL | Status: DC | PRN

## 2012-10-06 MED ORDER — ACETAMINOPHEN 325 MG PO TABS
975.0000 mg | ORAL_TABLET | Freq: Once | ORAL | Status: AC
  Administered 2012-10-06: 975 mg via ORAL
  Filled 2012-10-06: qty 3

## 2012-10-06 MED ORDER — TRAZODONE HCL 50 MG PO TABS
50.0000 mg | ORAL_TABLET | Freq: Every day | ORAL | Status: DC
  Administered 2012-10-07: 50 mg via ORAL
  Filled 2012-10-06: qty 1

## 2012-10-06 MED ORDER — ZOLPIDEM TARTRATE 5 MG PO TABS
5.0000 mg | ORAL_TABLET | Freq: Every evening | ORAL | Status: DC | PRN
  Administered 2012-10-07: 5 mg via ORAL
  Filled 2012-10-06: qty 1

## 2012-10-06 NOTE — ED Provider Notes (Signed)
History     CSN: [id number]  Arrival date & time 10/06/12  2201   None     Chief Complaint  Patient presents with  . Headache  . Hallucinations   HPI  History provided by the patient. Patient is a 46 year old male with history of paranoid schizophrenia and depression who presents with concerns for increased auditory or visual hallucinations as well as thoughts of harming himself. Patient states he has been off his medications for the past 2 weeks with worsening symptoms. Patient states he is hearing things that put thoughts into his head and want to hurt himself. He denies any specific instructions or voices. Patient does state that he had taken a knife earlier while home with thoughts of slitting his throat. Patient states he walked outside with a knife and threw it in the bushes. Patient also states that he has had visual hallucinations of images that he cannot quite piece together. Patient denies any past history of suicidal or self-harm. Patient lives alone.  Patient does have a father in town. Patient denies any alcohol or drug use. Patient is requesting help with his symptoms.     Past Medical History  Diagnosis Date  . Paranoid schizophrenia   . Depression     History reviewed. No pertinent past surgical history.  No family history on file.  History  Substance Use Topics  . Smoking status: Current Every Day Smoker -- 0.5 packs/day  . Smokeless tobacco: Current User    Types: Chew  . Alcohol Use: 0.6 oz/week    1 Cans of beer per week     Comment: one can of beer weekly      Review of Systems  Constitutional: Negative for fever.  Respiratory: Negative for cough.   Gastrointestinal: Negative for vomiting, abdominal pain and diarrhea.  Neurological: Negative for headaches.  Psychiatric/Behavioral: Positive for suicidal ideas, hallucinations, sleep disturbance and dysphoric mood. Negative for self-injury.  All other systems reviewed and are  negative.    Allergies  Review of patient's allergies indicates no known allergies.  Home Medications   Current Outpatient Rx  Name  Route  Sig  Dispense  Refill  . BENZTROPINE MESYLATE 2 MG PO TABS   Oral   Take 1 tablet (2 mg total) by mouth at bedtime.   30 tablet   0      Provide 2 weeks supply of medication.   . IBUPROFEN 200 MG PO TABS   Oral   Take 200 mg by mouth every 6 (six) hours as needed. For pain         . RISPERIDONE 2 MG PO TABS   Oral   Take 2 mg by mouth at bedtime.         . TRAZODONE HCL 50 MG PO TABS   Oral   Take 50 mg by mouth at bedtime.         Marland Kitchen RISPERIDONE 2 MG PO TABS   Oral   Take 1 tablet (2 mg total) by mouth at bedtime.   30 tablet   0      Patient will need 2 weeks supply of medication     BP 115/92  Pulse 67  Temp 98.2 F (36.8 C) (Oral)  Resp 18  SpO2 98%  Physical Exam  Nursing note and vitals reviewed. Constitutional: He is oriented to person, place, and time. He appears well-developed and well-nourished. No distress.  HENT:  Head: Normocephalic.  Cardiovascular: Normal rate and regular rhythm.  Pulmonary/Chest: Effort normal and breath sounds normal.  Abdominal: Soft. There is no tenderness.  Musculoskeletal: Normal range of motion. He exhibits no edema and no tenderness.  Neurological: He is alert and oriented to person, place, and time.  Skin: Skin is warm.  Psychiatric: He is actively hallucinating. Thought content is paranoid. He expresses no homicidal ideation. He expresses no suicidal plans.    ED Course  Procedures   Results for orders placed during the hospital encounter of 10/06/12  CBC WITH DIFFERENTIAL      Component Value Range   WBC 7.7  4.0 - 10.5 K/uL   RBC 4.68  4.22 - 5.81 MIL/uL   Hemoglobin 14.5  13.0 - 17.0 g/dL   HCT 16.1  09.6 - 04.5 %   MCV 88.7  78.0 - 100.0 fL   MCH 31.0  26.0 - 34.0 pg   MCHC 34.9  30.0 - 36.0 g/dL   RDW 40.9  81.1 - 91.4 %   Platelets 314  150 - 400  K/uL   Neutrophils Relative 53  43 - 77 %   Neutro Abs 4.1  1.7 - 7.7 K/uL   Lymphocytes Relative 35  12 - 46 %   Lymphs Abs 2.7  0.7 - 4.0 K/uL   Monocytes Relative 9  3 - 12 %   Monocytes Absolute 0.7  0.1 - 1.0 K/uL   Eosinophils Relative 4  0 - 5 %   Eosinophils Absolute 0.3  0.0 - 0.7 K/uL   Basophils Relative 0  0 - 1 %   Basophils Absolute 0.0  0.0 - 0.1 K/uL  COMPREHENSIVE METABOLIC PANEL      Component Value Range   Sodium 138  135 - 145 mEq/L   Potassium 3.9  3.5 - 5.1 mEq/L   Chloride 101  96 - 112 mEq/L   CO2 28  19 - 32 mEq/L   Glucose, Bld 104 (*) 70 - 99 mg/dL   BUN 9  6 - 23 mg/dL   Creatinine, Ser 7.82  0.50 - 1.35 mg/dL   Calcium 9.4  8.4 - 95.6 mg/dL   Total Protein 7.6  6.0 - 8.3 g/dL   Albumin 4.1  3.5 - 5.2 g/dL   AST 23  0 - 37 U/L   ALT 20  0 - 53 U/L   Alkaline Phosphatase 52  39 - 117 U/L   Total Bilirubin 0.3  0.3 - 1.2 mg/dL   GFR calc non Af Amer 88 (*) >90 mL/min   GFR calc Af Amer >90  >90 mL/min  URINE RAPID DRUG SCREEN (HOSP PERFORMED)      Component Value Range   Opiates NONE DETECTED  NONE DETECTED   Cocaine NONE DETECTED  NONE DETECTED   Benzodiazepines NONE DETECTED  NONE DETECTED   Amphetamines NONE DETECTED  NONE DETECTED   Tetrahydrocannabinol NONE DETECTED  NONE DETECTED   Barbiturates NONE DETECTED  NONE DETECTED  ETHANOL      Component Value Range   Alcohol, Ethyl (B) <11  0 - 11 mg/dL        1. Schizophrenia, paranoid type       MDM  11:00 PM patient seen and evaluated. Patient currently expressing concerns of harming himself secondary to hallucinations. Patient is voluntary.   Spoke with Berna Spare with [facility] act team. They will see patient and evaluate for placement.  Patient will be moved to pod C with psych holding orders in place.     Phill Mutter  Pressley T[place], [place] 10/07/12 [phone]

## 2012-10-06 NOTE — ED Notes (Signed)
Pt with h/o schizophrenia. Out of medicine. C/o HA. Hearing voices. Describes as "not nice or mean voices, just neutral voices", "all scrambled together", "irritating b/c I cannot figure it out". Pt alert, NAD, calm, interactive, polite, cooperative, steady gait.  Denies HI or SI ("not right now"), denies ETOH or drug use.

## 2012-10-07 ENCOUNTER — Inpatient Hospital Stay (HOSPITAL_COMMUNITY)
Admission: RE | Admit: 2012-10-07 | Discharge: 2012-10-13 | DRG: 885 | Disposition: A | Discharge status: Home / Self Care | Payer: Medicare Other | Source: Ambulatory Visit | Attending: Psychiatry | Admitting: Psychiatry

## 2012-10-07 DIAGNOSIS — Z79899 Other long term (current) drug therapy: Secondary | ICD-10-CM | POA: Diagnosis not present

## 2012-10-07 DIAGNOSIS — F3289 Other specified depressive episodes: Secondary | ICD-10-CM | POA: Diagnosis present

## 2012-10-07 DIAGNOSIS — F200 Paranoid schizophrenia: Secondary | ICD-10-CM

## 2012-10-07 DIAGNOSIS — F29 Unspecified psychosis not due to a substance or known physiological condition: Secondary | ICD-10-CM | POA: Diagnosis not present

## 2012-10-07 DIAGNOSIS — R51 Headache: Secondary | ICD-10-CM | POA: Diagnosis present

## 2012-10-07 DIAGNOSIS — F209 Schizophrenia, unspecified: Secondary | ICD-10-CM | POA: Diagnosis present

## 2012-10-07 DIAGNOSIS — F329 Major depressive disorder, single episode, unspecified: Secondary | ICD-10-CM | POA: Diagnosis not present

## 2012-10-07 LAB — COMPREHENSIVE METABOLIC PANEL
AST: 23 U/L (ref 0–37)
Alkaline Phosphatase: 52 U/L (ref 39–117)
BUN: 9 mg/dL (ref 6–23)
CO2: 28 mEq/L (ref 19–32)
Chloride: 101 mEq/L (ref 96–112)
Creatinine, Ser: 1 mg/dL (ref 0.50–1.35)
GFR calc non Af Amer: 88 mL/min — ABNORMAL LOW (ref >90)
Total Bilirubin: 0.3 mg/dL (ref 0.3–1.2)

## 2012-10-07 LAB — RAPID URINE DRUG SCREEN, HOSP PERFORMED
Barbiturates: NOT DETECTED
Cocaine: NOT DETECTED

## 2012-10-07 MED ORDER — IBUPROFEN 200 MG PO TABS
200.0000 mg | ORAL_TABLET | Freq: Four times a day (QID) | ORAL | Status: DC | PRN
  Administered 2012-10-10 – 2012-10-11 (×2): 200 mg via ORAL
  Filled 2012-10-07 (×3): qty 1

## 2012-10-07 MED ORDER — MAGNESIUM HYDROXIDE 400 MG/5ML PO SUSP: 30.0000 mL | Freq: Every day | ORAL | Status: DC | PRN

## 2012-10-07 MED ORDER — RISPERIDONE 2 MG PO TABS
2.0000 mg | ORAL_TABLET | Freq: Every day | ORAL | Status: DC
  Administered 2012-10-07 – 2012-10-12 (×6): 2 mg via ORAL
  Filled 2012-10-07 (×9): qty 1

## 2012-10-07 MED ORDER — BENZTROPINE MESYLATE 2 MG PO TABS
2.0000 mg | ORAL_TABLET | Freq: Every day | ORAL | Status: DC
  Administered 2012-10-07 – 2012-10-08 (×2): 2 mg via ORAL
  Filled 2012-10-07: qty 2
  Filled 2012-10-07 (×3): qty 1

## 2012-10-07 MED ORDER — TRAZODONE HCL 50 MG PO TABS
50.0000 mg | ORAL_TABLET | Freq: Every day | ORAL | Status: DC
  Administered 2012-10-07 – 2012-10-11 (×5): 50 mg via ORAL
  Filled 2012-10-07 (×7): qty 1

## 2012-10-07 MED ORDER — ALUM & MAG HYDROXIDE-SIMETH 200-200-20 MG/5ML PO SUSP
30.0000 mL | ORAL | Status: DC | PRN
  Administered 2012-10-11: 30 mL via ORAL

## 2012-10-07 NOTE — [facility] Counselor (Signed)
Patient has been accepted to [facility] by Nanine Means NP pending bed availability.

## 2012-10-07 NOTE — Progress Notes (Signed)
Psychoeducational Group Note  Date:  10/07/2012 Time:  2000  Group Topic/Focus:  Wrap-Up Group:   The focus of this group is to help patients review their daily goal of treatment and discuss progress on daily workbooks.  Participation Level:  Active  Participation Quality:  Appropriate  Affect:  Appropriate  Cognitive:  Appropriate  Insight:  Good  Engagement in Group:  Good  Additional Comments:  Patient attended and participated in group tonight. He reports having a good day. He went to all his meals, watch television, attended groups and socialized with peers. Patient advised that he cope by occupying his mind with things, walking and attending groups. Patient advised that he made a good decision to come into this hospital.  Lita Mains [facility] 10/07/2012, 11:25 PM

## 2012-10-07 NOTE — ED Provider Notes (Signed)
Medical screening examination/treatment/procedure(s) were performed by non-physician practitioner and as supervising physician I was immediately available for consultation/collaboration.   Julie Manly, MD 10/07/12 0753 

## 2012-10-07 NOTE — ED Notes (Signed)
Received report regarding pt.  Pt personal belongings recorded on sheet and part was sent to security and other part was sent to locker.

## 2012-10-07 NOTE — Progress Notes (Signed)
Patient ID: Damon [name], male   DOB: 11/04/1966, 46 y.o.   MRN: [id number] Patient is a 46 year old male admitted voluntarily for help with auditory hallucinations and suicidal gestures. Patient reports that his medications were recently switched and this caused a "set back." The [facility] ED reported that patient had been out of medications for several weeks. Patient reports that he is still hearing mumbled voices but is no longer seeing "monsters." He reports being very depressed about the death of his mother is 04-15-12. He describes that she was his main support and feels lost without her. Patient describes having difficulty maintaining relationships with others due to stigma from his mental illness. Patient asked several times during the admission if he made the right decision to seek help. Patient reported to Clinical research associate that he had held a knife to his neck and had contemplated cutting himself. Patient able to contract for safety and denies current SI/HI. He is very pleasant and cooperative. Oriented to the 400 hall unit and routine.

## 2012-10-07 NOTE — BH Assessment (Signed)
Assessment Note   Damon [name] is an 46 y.o. male. Pt has been out of his medications for the last few weeks.  He is seen by a psychiatrist at [facility] and has an apptointment set up for December 4.  Patient says that he has been hearing voices telling him to kill himself.  He says that he sees a "monster" that tells him to harm himself.  His plan today is to cut his throat to kill himself.  Patient had a knife with him and threw it away and came to [facility].  Pt states that he knows he needs help at this time.  Patient denies any HI.  He is seen at [facility] for medications management and missed his last appt so he is scheduled for an appt there on 12/04.  At this time patient needs inpatient care to provide stabilization. Axis I: 295.3 Schizophrenia, paranoid type Axis II: Deferred Axis III:  Past Medical History  Diagnosis Date  . Paranoid schizophrenia   . Depression    Axis IV: economic problems, occupational problems, problems related to social environment and problems with access to health care services Axis V: 31-40 impairment in reality testing  Past Medical History:  Past Medical History  Diagnosis Date  . Paranoid schizophrenia   . Depression     History reviewed. No pertinent past surgical history.  Family History: No family history on file.  Social History:  reports that he has been smoking.  His smokeless tobacco use includes Chew. He reports that he drinks about .6 ounces of alcohol per week. He reports that he does not use illicit drugs.  Additional Social History:  Alcohol / Drug Use Pain Medications: None Prescriptions: Pt takes risperdol, cogentin & trazadone.  It has been a few weeks since he has been on medication. Over the Counter: N/A History of alcohol / drug use?: No history of alcohol / drug abuse  CIWA: CIWA-Ar BP: 126/84 mmHg Pulse Rate: 71  COWS:    Allergies: No Known Allergies  Home Medications:  (Not in a hospital admission)  OB/GYN Status:   No LMP for male patient.  General Assessment Data Location of Assessment: [facility] ED Living Arrangements: Alone Can pt return to current living arrangement?: Yes Admission Status: Voluntary Is patient capable of signing voluntary admission?: Yes Transfer from: Acute Hospital Referral Source: Self/Family/Friend     Risk to self Suicidal Ideation: Yes-Currently Present Suicidal Intent: Yes-Currently Present Is patient at risk for suicide?: Yes Suicidal Plan?: Yes-Currently Present Specify Current Suicidal Plan: thought about cutting his own throat Access to Means: Yes Specify Access to Suicidal Means: Knives, sharps What has been your use of drugs/alcohol within the last 12 months?: None Previous Attempts/Gestures: Yes How many times?: 2  Triggers for Past Attempts: Family contact;Unpredictable Intentional Self Injurious Behavior: None Family Suicide History: Yes Recent stressful life event(s): Conflict (Comment) (Recent break up w/ girlfriend) Persecutory voices/beliefs?: Yes Depression: Yes Depression Symptoms: Despondent;Insomnia;Isolating;Loss of interest in usual pleasures;Feeling worthless/self pity Substance abuse history and/or treatment for substance abuse?: No Suicide prevention information given to non-admitted patients: Not applicable  Risk to Others Homicidal Ideation: No-Not Currently/Within Last 6 Months Thoughts of Harm to Others: No Current Homicidal Intent: No Current Homicidal Plan: No Access to Homicidal Means: No Identified Victim: No one History of harm to others?: No Assessment of Violence: None Noted Violent Behavior Description: Pt calm and cooperative Does patient have access to weapons?: Yes (Comment) (Pt has access to knives) Criminal Charges Pending?: No  Does patient have a court date: No  Psychosis Hallucinations: Auditory;Visual (Sees a "monster" that speaks to him & tells him to harm self) Delusions: None noted  Mental Status  Report Appear/Hygiene:  (Casual) Eye Contact: Good Motor Activity: Freedom of movement;Unremarkable Speech: Logical/coherent Level of Consciousness: Quiet/awake Mood: Depressed;Sad;Anxious Affect: Anxious;Depressed Anxiety Level: Panic Attacks Panic attack frequency: 1x/W Most recent panic attack: Last night Thought Processes: Coherent;Relevant Judgement: Impaired Orientation: Person;Place;Situation Obsessive Compulsive Thoughts/Behaviors: None  Cognitive Functioning Concentration: Decreased Memory: Remote Intact;Recent Impaired IQ: Average Insight: Fair Impulse Control: Poor Appetite: Poor Weight Loss: 0  Weight Gain: 0  Sleep: Decreased Total Hours of Sleep:  (<6H/D) Vegetative Symptoms: Staying in bed  ADLScreening [facility] Assessment Services) Patient's cognitive ability adequate to safely complete daily activities?: Yes Patient able to express need for assistance with ADLs?: Yes Independently performs ADLs?: Yes (appropriate for developmental age)  Abuse/Neglect [facility]) Physical Abuse: Denies Verbal Abuse: Denies Sexual Abuse: Denies  Prior Inpatient Therapy Prior Inpatient Therapy: Yes Prior Therapy Dates: Ine year ago Prior Therapy Facilty/Provider(s): [facility] Reason for Treatment: SA  Prior Outpatient Therapy Prior Outpatient Therapy: Yes Prior Therapy Dates: Past 10 years Prior Therapy Facilty/Provider(s): [facility] Reason for Treatment: Depression, med management  ADL Screening (condition at time of admission) Patient's cognitive ability adequate to safely complete daily activities?: Yes Patient able to express need for assistance with ADLs?: Yes Independently performs ADLs?: Yes (appropriate for developmental age) Weakness of Legs: None Weakness of Arms/Hands: None  Home Assistive Devices/Equipment Home Assistive Devices/Equipment: None    Abuse/Neglect Assessment (Assessment to be complete while patient is alone) Physical Abuse:  Denies Verbal Abuse: Denies Sexual Abuse: Denies Exploitation of patient/patient's resources: Denies Self-Neglect: Denies     Merchant navy officer (For Healthcare) Advance Directive: Patient does not have advance directive;Patient would not like information    Additional Information 1:1 In Past 12 Months?: No CIRT Risk: No Elopement Risk: No Does patient have medical clearance?: Yes     Disposition:  Disposition Disposition of Patient: Inpatient treatment program;Referred to Type of inpatient treatment program: Adult Patient referred to:  (Referred to [facility] for placement)  On Site Evaluation by:   Reviewed with Physician:     Alexandria Lodge 10/07/2012 5:43 AM

## 2012-10-07 NOTE — ED Notes (Signed)
Berna Spare, mental health evaluator at bedside for consult.

## 2012-10-07 NOTE — ED Notes (Signed)
Report given to Melody, RN in Pod C. Pt escorted with RN to room 21 in Pod C. Personal belonging bags (2) searched and recorded with RN.

## 2012-10-07 NOTE — Progress Notes (Signed)
Spoke with pt 1:1 who displays good insight about his current condition. Pt states, "I can't solve things myself. I need to talk to others and not let it bottle up inside." Reports that he at first was not sure about coming for help but is now relieved to be here. He continues endorse AH in the form of mumbles that are "hard to make out." Denies any VH/SI/HI and is able to verbally contract for safety. Support and encouragement given. Educated about fall precautions and scheduled meds. He denies any physical pain or problems and remains safe. Damon [name]

## 2012-10-07 NOTE — ED Notes (Signed)
Patient transferred to [facility] 400 hall bed #2 report called to [employer]. He is calm alert and oriented times 4 without complaints at the time of discharge. He will be transferred via [facility] and a tech.

## 2012-10-08 DIAGNOSIS — F200 Paranoid schizophrenia: Principal | ICD-10-CM

## 2012-10-08 MED ORDER — NICOTINE 21 MG/24HR TD PT24
MEDICATED_PATCH | TRANSDERMAL | Status: AC
  Filled 2012-10-08: qty 1

## 2012-10-08 MED ORDER — NICOTINE 21 MG/24HR TD PT24
21.0000 mg | MEDICATED_PATCH | Freq: Every day | TRANSDERMAL | Status: DC
  Administered 2012-10-08 – 2012-10-13 (×6): 21 mg via TRANSDERMAL
  Filled 2012-10-08 (×7): qty 1
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
## 2012-10-08 NOTE — BHH Suicide Risk Assessment (Signed)
Suicide Risk Assessment  Admission Assessment     Nursing information obtained from:  Patient Demographic factors:  Male;Low socioeconomic status Current Mental Status:  See below Loss Factors:  Loss of significant relationship Historical Factors:  Prior suicide attempts Risk Reduction Factors:  Religious beliefs about death;Positive social support;Positive therapeutic relationship  CLINICAL FACTORS:   Schizophrenia:   Paranoid or undifferentiated type  COGNITIVE FEATURES THAT CONTRIBUTE TO RISK:  Loss of executive function    SUICIDE RISK:   Mild:  Suicidal ideation of limited frequency, intensity, duration, and specificity.  There are no identifiable plans, no associated intent, mild dysphoria and related symptoms, good self-control (both objective and subjective assessment), few other risk factors, and identifiable protective factors, including available and accessible social support.  PLAN OF CARE: Mental Status Examination/Evaluation:  Objective: Appearance: Bizarre, Casual and Fairly Groomed   Eye Contact:: Poor   Speech: Clear and Coherent   Volume: Normal   Mood: Anxious   Affect: Inappropriate   Thought Process: Disorganized   Orientation: Full   Thought Content: Hallucinations: Auditory  Command: To harm self and Paranoid Ideation   Suicidal Thoughts: Yes. with intent/plan   Homicidal Thoughts: No   Memory: Immediate; Good  Recent; Good  Remote; Good   Judgement: Impaired   Insight: Lacking   Psychomotor Activity: Increased   Concentration: Good   Recall: Good   Akathisia: No   Handed:   AIMS (if indicated):   Assets: Communication Skills  Housing  Social Support   Sleep: Number of Hours: 6    Laboratory/X-Ray  Psychological Evaluation(s)      Assessment:  AXIS I: Chronic Paranoid Schizophrenia  AXIS II: Deferred  AXIS III:  Past Medical History   Diagnosis  Date   .  Paranoid schizophrenia    .  Depression     AXIS IV: occupational problems and  problems related to social environment  AXIS V: 11-20 some danger of hurting self or others possible OR occasionally fails to maintain minimal personal hygiene OR gross impairment in communication  Treatment Plan/Recommendations:  Treatment Plan Summary:  Daily contact with patient to assess and evaluate symptoms and progress in treatment  Medication management  Resume home medications (risperidone)  Labs tomorrow lipids, a1c and tsh   Wonda Cerise 10/08/2012, 3:29 PM

## 2012-10-08 NOTE — Progress Notes (Signed)
D-Patient is appropriate and pleasant this shift. Rates depression and hopelessness at 6. Denies SI.  Has good insight into mental health recovery. Reports low energy. Compliant with scheduled medications and no prn's requested. Attending groups with good participation. Minimal A/V/H. A- Support and encouragement given. Continue current POC and evaluation of treatment goals. Continue 15' checks for safety.
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
## 2012-10-08 NOTE — Clinical Social Work Note (Signed)
BHH Group Notes:  (Clinical Social Work)  10/08/2012   11:15-11:45AM  Summary of Progress/Problems:  The main focus of today's process group was for the patient to define "support" and describe what healthy supports are, then to identify the patient's current support system and decide on other supports that can be put in place to prevent future hospitalizations.   An emphasis was placed on using therapist, doctor and problem-specific support groups to expand supports. The patient expressed that he is very good at using the therapy/support group at [facility] to meet his needs.  He also has his father and friends.  He will use the crisis line as needed.  Type of Therapy:  Group Therapy  Participation Level:  Active  Participation Quality:  Appropriate, Attentive and Sharing  Affect:  Blunted  Cognitive:  Alert, Appropriate and Oriented  Insight:  Good  Engagement in Group:  Good  Engagement in Therapy:  Good  Modes of Intervention:  Clarification, Education, Limit-setting, Problem-solving, Socialization, Support and Processing   Ambrose Mantle, LCSW 10/08/2012,

## 2012-10-08 NOTE — H&P (Signed)
  Pt was seen by me today and I agree with the key elements documented in H&P.  

## 2012-10-08 NOTE — Progress Notes (Signed)
Pt approached staff at 2040 in distress reporting loud voices instructing him to kill himself. Pt states he was in the dayroom and saw something flash across the TV telling him he is worthless and that people are trying to poison him. Pt fearful, anxious. Time spent supporting and reassuring patient 1:1. Allowed pt to see the opening of his 3 hs meds as well as fresh can of ginger ale. Pt needed much reassurance that it was okay to come out of his room, take his meds and then sit in the dayroom. On reassess pt is sleepy, resting in bed and states voices remain loud but he can no longer make out what they are saying. He continues to report dizziness and fall precautions were reviewed and strongly encouraged as well as instructions for adequate fluid intake explained. No HI at this time, pt contracts for safety. Lawrence Marseilles

## 2012-10-08 NOTE — Progress Notes (Signed)
[facility] Group Notes:  (Counselor/Nursing/MHT/Case Management/Adjunct)  10/08/2012 8:54 PM  Type of Therapy:  Psychoeducational Skills  Participation Level:  Minimal  Participation Quality:  Attentive  Affect:  Flat  Cognitive:  Appropriate  Insight:  Good  Engagement in Group:  Limited  Engagement in Therapy:  Limited  Modes of Intervention:  Education  Summary of Progress/Problems: The patient mentioned in group this evening that he had a good day since he was "more open" with his peers along with participating in group. His goal for tomorrow is to communicate more with the staff.    Hazle Coca S 10/08/2012, 8:54 PM

## 2012-10-09 DIAGNOSIS — F200 Paranoid schizophrenia: Secondary | ICD-10-CM

## 2012-10-09 LAB — LIPID PANEL
HDL: 89 mg/dL (ref >39)
LDL Cholesterol: 89 mg/dL (ref 0–99)
Triglycerides: 48 mg/dL (ref <150)
VLDL: 10 mg/dL (ref 0–40)

## 2012-10-09 MED ORDER — FLUOXETINE HCL 20 MG PO CAPS
20.0000 mg | ORAL_CAPSULE | Freq: Every day | ORAL | Status: DC
  Administered 2012-10-09 – 2012-10-13 (×5): 20 mg via ORAL
  Filled 2012-10-09: qty 3
  Filled 2012-10-09 (×6): qty 1

## 2012-10-09 MED ORDER — BENZTROPINE MESYLATE 1 MG PO TABS
1.0000 mg | ORAL_TABLET | Freq: Every day | ORAL | Status: DC
  Administered 2012-10-09 – 2012-10-12 (×4): 1 mg via ORAL
  Filled 2012-10-09 (×4): qty 1
  Filled 2012-10-09: qty 3
  Filled 2012-10-09: qty 1

## 2012-10-09 NOTE — Clinical Social Work Note (Signed)
  Type of Therapy: Process Group Therapy  Participation Level:  Active  Participation Quality:  Appropriate and Attentive  Affect:  Irritable  Cognitive:  Alert  Insight:  Limited  Engagement in Group:  Limited  Engagement in Therapy:  Limited  Modes of Intervention:  Activity, Clarification, Education, Problem-solving and Support  Summary of Progress/Problems: Today's group addressed the issue of overcoming obstacles.  Patients were asked to identify their biggest obstacle post d/c that stands in the way of their on-going success, and then problem solve as to how to manage this.Damon [name] stated his main obstacle is others who pull him down negatively and will not leave him alone.  He feels pressured by them and is wanting confirmation that his perception is correct.  I was not able to confirm that, but was willing to problem solve with him.  He was unable to do so.  Stuck with the complaining today.  Later joined in discussion of how sometimes it feels like staff are disrespectful to patients.       Ida Rogue 10/09/2012   5:16 PM

## 2012-10-09 NOTE — Progress Notes (Signed)
Patient ID: Damon [name], male   DOB: 20-May-1966, 46 y.o.   MRN: [id number]  STATE REGULATIONS 482.30  THIS CHART WAS REVIEWED FOR MEDICAL NECESSITY WITH RESPECT TO THE PATIENT'S ADMISSION/ DURATION OF STAY.  11/23-11/25  NEXT REVIEW DATE: 10/10/2012  Willa Rough, RN, BSN CASE MANAGER

## 2012-10-09 NOTE — Progress Notes (Signed)
[facility] MD Progress Note  10/09/2012 11:40 AM Damon [name]  MRN:  [id number]  Diagnosis:  Axis I: Chronic Paranoid Schizophrenia  ADL's:  Intact  Sleep: Fair  Appetite:  Fair  Suicidal Ideation:  Plan:  YES Intent:  NONE Means:  NONE Homicidal Ideation:  Plan:  none Intent:  none Means:  none  AEB (as evidenced by):  Subjective: Patient seen this morning and chart was reviewed. He continues to report hearing voices telling him to hurt himself but has no plan to do so. He reports feeling depressed, low energy level and hopeless due to so many people died in his family in the last couple of months. He is verbalizing paranoid ideations; thinking that people are trying to poison his food.  Mental Status Examination/Evaluation: Objective:  Appearance: Casual and Fairly Groomed  Patent attorney::  Fair  Speech:  Clear and Coherent  Volume:  Normal  Mood:  Dysphoric  Affect:  Constricted  Thought Process:  Coherent and Linear  Orientation:  Full  Thought Content:  Delusions, Hallucinations: Auditory Command:  hearing voices telling him to hurt himself Visual and Paranoid Ideation  Suicidal Thoughts:  Yes.  without intent/plan  Homicidal Thoughts:  No  Memory:  Immediate;   Fair Recent;   Fair Remote;   Fair  Judgement:  Impaired  Insight:  Lacking  Psychomotor Activity:  Normal  Concentration:  Fair  Recall:  Fair  Akathisia:  No  Handed:  Right  AIMS (if indicated):     Assets:  Communication Skills Desire for Improvement  Sleep:  Number of Hours: 5.5    Vital Signs:Blood pressure 142/86, pulse 99, temperature 97.5 F (36.4 C), temperature source Oral, resp. rate 16, height 5\' 10"  (1.778 m), weight 74.39 kg (164 lb). Current Medications: Current Facility-Administered Medications  Medication Dose Route Frequency Provider Last Rate Last Dose  . alum & mag hydroxide-simeth (MAALOX/MYLANTA) 200-200-20 MG/5ML suspension 30 mL  30 mL Oral Q4H PRN Jorje Guild, PA-C      .  benztropine (COGENTIN) tablet 2 mg  2 mg Oral QHS Jorje Guild, PA-C   2 mg at 10/08/12 2043  . ibuprofen (ADVIL,MOTRIN) tablet 200 mg  200 mg Oral Q6H PRN Jorje Guild, PA-C      . magnesium hydroxide (MILK OF MAGNESIA) suspension 30 mL  30 mL Oral Daily PRN Jorje Guild, PA-C      . nicotine (NICODERM CQ - dosed in mg/24 hours) patch 21 mg  21 mg Transdermal Daily Jorje Guild, PA-C   21 mg at 10/09/12 0802  . risperiDONE (RISPERDAL) tablet 2 mg  2 mg Oral QHS Jorje Guild, PA-C   2 mg at 10/08/12 2043  . traZODone (DESYREL) tablet 50 mg  50 mg Oral QHS Jorje Guild, PA-C   50 mg at 10/08/12 2043    Lab Results:  Results for orders placed during the hospital encounter of 10/07/12 (from the past 48 hour(s))  HEMOGLOBIN A1C     Status: Abnormal   Collection Time   10/09/12  6:20 AM      Component Value Range Comment   Hemoglobin A1C 5.9 (*) <5.7 %    Mean Plasma Glucose 123 (*) <117 mg/dL   LIPID PANEL     Status: Normal   Collection Time   10/09/12  6:20 AM      Component Value Range Comment   Cholesterol 188  0 - 200 mg/dL    Triglycerides 48  <657 mg/dL    HDL 89  >  39 mg/dL    Total CHOL/HDL Ratio 2.1      VLDL 10  0 - 40 mg/dL    LDL Cholesterol 89  0 - 99 mg/dL   TSH     Status: Normal   Collection Time   10/09/12  6:20 AM      Component Value Range Comment   TSH 1.373  0.350 - 4.500 uIU/mL     Physical Findings: AIMS: Facial and Oral Movements Muscles of Facial Expression: None, normal Lips and Perioral Area: None, normal Jaw: None, normal Tongue: None, normal,Extremity Movements Upper (arms, wrists, hands, fingers): None, normal Lower (legs, knees, ankles, toes): None, normal, Trunk Movements Neck, shoulders, hips: None, normal, Overall Severity Severity of abnormal movements (highest score from questions above): None, normal Incapacitation due to abnormal movements: None, normal Patient's awareness of abnormal movements (rate only patient's report): No Awareness, Dental  Status Current problems with teeth and/or dentures?: No Does patient usually wear dentures?: No  CIWA:    COWS:     Treatment Plan Summary: Daily contact with patient to assess and evaluate symptoms and progress in treatment Medication management  Plan: 1. I will continue Risperdal to address delusions and psychosis 2. I will initiate Prozac 20mg  daily to address depressive symptoms.  Thedore Mins, MD 10/09/2012, 11:40 AM

## 2012-10-09 NOTE — Progress Notes (Signed)
Patient ID: Damon [name], male   DOB: [birth year], 46 y.o.   MRN: [id number] Patient attended treatment team meeting this morning.  He has been calm and cooperative today; states he is hearing voices, but cannot understand what they are saying.  Patient reports they are "scrambled." he reports feeling drained with no energy with depressed mood.  He also stated that his relationship with a male friend ended 4 month ago because "she didn't understand my illness so I told her to go away."  His sister also passed away 5 months ago.  He stated he had a weapon in his hand and he was getting "ready to slit his throat."  Patient can contract for safety at this time.  He denies any HI.    Continue to monitor medication management and MD orders.  Continue with 15 minute safety checks per protocol.  His behavior has been appropriate; encourage and support patient.

## 2012-10-09 NOTE — Treatment Plan (Signed)
Interdisciplinary Treatment Plan Update (Adult)  Date: 10/09/2012  Time Reviewed: 9:47 AM   Progress in Treatment: Attending groups: Yes Participating in groups: Yes Taking medication as prescribed: Yes Tolerating medication: Yes   Family/Significant other contact made: No  Patient understands diagnosis:  Yes  As evidenced by asking for help with voices telling him to hurt self Discussing patient identified problems/goals with staff:  Yes   Medical problems stabilized or resolved:  Yes Denies suicidal/homicidal ideation: No  States he hs intermittent on-going thoughts Issues/concerns per patient self-inventory:  Yes  SI "on and off'  Poor sleep  Depression and hopelessness are 4's Other:  New problem(s) identified: N/A  Reason for Continuation of Hospitalization: Depression Hallucinations Medication stabilization Suicidal ideation  Interventions implemented related to continuation of hospitalization: Risperdal, Prozac trial  Encourage group attendance and participation  15 min safety checks  Additional comments:  Estimated length of stay: 2-4 days  Discharge Plan: return home, follow up outpt.  New goal(s): N/A  Review of initial/current patient goals per problem list:   1.  Goal(s): Eliminate SI  Met:  No  Target date:11/27  As evidenced BJ:YNWG report  2.  Goal (s): Eliminate psychosis  Met:  No  Target date:11/27  As evidenced by: Abatement of above symptom  3.  Goal(s):Stabilize mood with report of depression at  3 or less on 1-10 scale  Met:  No  Target date:11/27  As evidenced NF:AOZH report  4.  Goal(s):Identify comprehensive mental wellness plan  Met:  No  Target date:11/27  As evidenced YQ:MVHQ report  Attendees: Patient:  Damon [name] 10/09/2012 9:47 AM  Family:     Physician:  Thedore Mins 10/09/2012 9:47 AM   Nursing:  Joslyn Devon  10/09/2012 9:47 AM   Clinical Social Worker:  Richelle Ito 10/09/2012 9:47 AM   Extender:   Verne Spurr PA 10/09/2012 9:47 AM   Other:     Other:     Other:     Other:      Scribe for Treatment Team:   Ida Rogue, 10/09/2012 9:47 AM

## 2012-10-09 NOTE — Progress Notes (Signed)
D:  Patient in his room on approach.  Patient states he is fair.  Patient states he is learning to work on a couple of things like self control.  Patient states he has to continue taking his medications.  Patient states he is having passive SI denies HI and states he is positive for auditory hallucinations.  Patient states he hears voices mumbling but cannot make out what they are saying.  Patient verbally contracts for safety.  A: Staff to monitor Q 15 mins for safety.  Encouragement and support offered.  Scheduled medications administered per orders. R: Patient remains safe on the unit.  Patient attended group tonight.  Patient cooperative and taking administered medications.

## 2012-10-09 NOTE — Progress Notes (Signed)
Psychoeducational Group Note  Date:  10/09/2012 Time:  2000  Group Topic/Focus:  Wrap-Up Group:   The focus of this group is to help patients review their daily goal of treatment and discuss progress on daily workbooks.  Participation Level:  Active  Participation Quality:  Appropriate  Affect:  Appropriate  Cognitive:  Appropriate  Insight:  Good  Engagement in Group:  Good  Additional Comments:  Patient attended and participated in group tonight. Patient reports that he had a good day today, he attended groups. Stated that that the doctor changed his medication today. His body is still adjusting to the change. Patient advised that he was working on talking to people instead of holding things in.   Lita Mains [facility] 10/09/2012, 9:55 PM

## 2012-10-09 NOTE — Clinical Social Work Note (Signed)
Pt was seen this morning during discharge planning group.  Pt reported that he admitted to the hospital due to feeling depressed and hearing voices.  Pt rated his depression level at a four and his anxiety level at a five. Pt endorses SI, yet he was able to contract for safety. Pt will return home to the same living space.  Pt does not have transportation, so CSW will provide pt with a bus pass.  Pt does have access to medication.

## 2012-10-10 DIAGNOSIS — F251 Schizoaffective disorder, depressive type: Secondary | ICD-10-CM | POA: Insufficient documentation

## 2012-10-10 DIAGNOSIS — F209 Schizophrenia, unspecified: Secondary | ICD-10-CM | POA: Diagnosis present

## 2012-10-10 DIAGNOSIS — F200 Paranoid schizophrenia: Secondary | ICD-10-CM | POA: Insufficient documentation

## 2012-10-10 DIAGNOSIS — F329 Major depressive disorder, single episode, unspecified: Secondary | ICD-10-CM | POA: Insufficient documentation

## 2012-10-10 NOTE — Progress Notes (Signed)
[facility] MD Progress Note  10/10/2012 5:11 PM BOBIE CICCARELLO  MRN:  [id number]  Diagnosis:  Axis I: Chronic Paranoid Schizophrenia   ADL's:  Intact  Sleep: Poor"on and off due to bad dreams."  Appetite:  Fair  Suicidal Ideation:  Yes "off and on" but patient can and does contract for safety. Homicidal Ideation:  denies  AEB (as evidenced by): Subjective: I met 1:1 with [name] to discuss his care and response to treatment. He states he is doing well and has no new complaints other than the bad dreams. Mental Status Examination/Evaluation: Objective:  Appearance: Disheveled  Eye Contact::  Fair  Speech:  Normal Rate  Volume:  Normal  Mood:  Anxious  Affect:  Congruent  Thought Process:  Disorganized  Orientation:  Other:  1/3  Thought Content:  WDL  Suicidal Thoughts:  Yes.  without intent/plan  Homicidal Thoughts:  No  Memory:  Immediate;   Fair  Judgement:  Fair  Insight:  Present  Psychomotor Activity:  Normal  Concentration:  Fair  Recall:  Fair  Akathisia:  No  Handed:  Right  AIMS (if indicated):     Assets:  Desire for Improvement  Sleep:  Number of Hours: 6    Vital Signs:Blood pressure 137/98, pulse 120, temperature 97.5 F (36.4 C), temperature source Oral, resp. rate 20, height 5\' 10"  (1.778 m), weight 74.39 kg (164 lb). Current Medications: Current Facility-Administered Medications  Medication Dose Route Frequency Provider Last Rate Last Dose  . alum & mag hydroxide-simeth (MAALOX/MYLANTA) 200-200-20 MG/5ML suspension 30 mL  30 mL Oral Q4H PRN Jorje Guild, PA-C      . benztropine (COGENTIN) tablet 1 mg  1 mg Oral QHS Mojeed Akintayo   1 mg at 10/09/12 2137  . FLUoxetine (PROZAC) capsule 20 mg  20 mg Oral Daily Mojeed Akintayo   20 mg at 10/10/12 0835  . ibuprofen (ADVIL,MOTRIN) tablet 200 mg  200 mg Oral Q6H PRN Jorje Guild, PA-C   200 mg at 10/10/12 0113  . magnesium hydroxide (MILK OF MAGNESIA) suspension 30 mL  30 mL Oral Daily PRN Jorje Guild, PA-C      .  nicotine (NICODERM CQ - dosed in mg/24 hours) patch 21 mg  21 mg Transdermal Daily Jorje Guild, PA-C   21 mg at 10/10/12 0836  . risperiDONE (RISPERDAL) tablet 2 mg  2 mg Oral QHS Jorje Guild, PA-C   2 mg at 10/09/12 2137  . traZODone (DESYREL) tablet 50 mg  50 mg Oral QHS Jorje Guild, PA-C   50 mg at 10/09/12 2137    Lab Results:  Results for orders placed during the hospital encounter of 10/07/12 (from the past 48 hour(s))  HEMOGLOBIN A1C     Status: Abnormal   Collection Time   10/09/12  6:20 AM      Component Value Range Comment   Hemoglobin A1C 5.9 (*) <5.7 %    Mean Plasma Glucose 123 (*) <117 mg/dL   LIPID PANEL     Status: Normal   Collection Time   10/09/12  6:20 AM      Component Value Range Comment   Cholesterol 188  0 - 200 mg/dL    Triglycerides 48  <409 mg/dL    HDL 89  >81 mg/dL    Total CHOL/HDL Ratio 2.1      VLDL 10  0 - 40 mg/dL    LDL Cholesterol 89  0 - 99 mg/dL   TSH     Status:  Normal   Collection Time   10/09/12  6:20 AM      Component Value Range Comment   TSH 1.373  0.350 - 4.500 uIU/mL     Physical Findings: AIMS: Facial and Oral Movements Muscles of Facial Expression: None, normal Lips and Perioral Area: None, normal Jaw: None, normal Tongue: None, normal,Extremity Movements Upper (arms, wrists, hands, fingers): None, normal Lower (legs, knees, ankles, toes): None, normal, Trunk Movements Neck, shoulders, hips: None, normal, Overall Severity Severity of abnormal movements (highest score from questions above): None, normal Incapacitation due to abnormal movements: None, normal Patient's awareness of abnormal movements (rate only patient's report): No Awareness, Dental Status Current problems with teeth and/or dentures?: No Does patient usually wear dentures?: No  CIWA:    COWS:     Treatment Plan Summary: Daily contact with patient to assess and evaluate symptoms and progress in treatment Medication management  Plan: 1. Stanislaus is responding  well to medication. Will continue current plan of care with no changes at this time. If he continues to do well he could likely discharge out on Thursday or Friday.  Rona Ravens. Darran Gabay PAC 10/10/2012, 5:11 PM

## 2012-10-10 NOTE — Progress Notes (Signed)
D: Patient in his room in bed on approach.  Patient rates his day as fair.  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  Patient state he has attended his groups today.  Patient states he is having passive SI denies HI and states he is having auditory hallucinations but verbally contracts for safety.  Patient states the voices are mumbling but he cannot make out what they are saying. A: Staff to monitor Q 15 mins for safety.  Encouragement and support offered.  Scheduled medications administered per orders R: Patient remains safe on the unit.  Patient attended group tonight.  PAtient calm, cooperative and taking administered medications

## 2012-10-10 NOTE — Clinical Social Work Note (Signed)
Pt was seen this morning during discharge planning group.  Pt stated that he ate and slept good.  Pt rated his depression and anxiety level at a five. Pt endorses fleeting SI/AVH, yet pt denies HI.

## 2012-10-10 NOTE — Progress Notes (Signed)
Patient ID: Damon [name], male   DOB: 06-06-1966, 46 y.o.   MRN: [id number] Patient reports that he feels his mood has improved today; his mood is less depressed.  He rates his depression a 5 out of 10.  He reports that he still hears voices, however, they are "scrambled" and he cannot understand them.  He reports passive SI and can contract for safety.  He denies HI.  Patient has brighter affect.  He attends groups with minimal participation.  He is calm and cooperative.  He is motivated for treatment.  Continue to monitor medication management and MD orders.  15 minute safety checks completed per protocol.  His behavior has been appropriate.  Encourage and support patient.

## 2012-10-10 NOTE — Progress Notes (Signed)
Psychoeducational Group Note  Date:  10/10/2012 Time:  2000  Group Topic/Focus:  Wrap-Up Group:   The focus of this group is to help patients review their daily goal of treatment and discuss progress on daily workbooks.  Participation Level:  Active  Participation Quality:  Appropriate  Affect:  Appropriate  Cognitive:  Appropriate  Insight:  Good  Engagement in Group:  Good  Additional Comments:  Patient stated that he had a good day because he went to the gym. Patient feels that the medications are working because he is able to focus more and gather his thoughts. Patient's goal is to try to communicate more with peers and staff.   Lyndee Hensen 10/10/2012, 9:20 PM

## 2012-10-10 NOTE — Clinical Social Work Note (Signed)
  [facility] Group Notes:  (Counselor/Nursing/MHT/Case Management/Adjunct)  10/10/2012 1:42 PM   Type of Therapy:  Group Therapy  Participation Level:  Minimal  Participation Quality:  Attentive  Affect:  Flat  Cognitive:  Alert  Insight:  Limited  Engagement in Group:  Limited  Engagement in Therapy:  Limited  Modes of Intervention:  Education, Problem-solving and Support  Summary of Progress/Problems: The theme of today's group was relapse prevention.  We focused specifically on both vulnerability and protective factors.  Patients were encourage to identify both, and identify at least one additional protective factor. Jamey participated minimally.  He did identify that he sometimes has no motivation [vulnerability] but is usually able to act like he is motivated to actually accomplish goals [protective factor].  He also identified patting himself on the back and rewarding himself with a special treat as protective factors.   Daryel Gerald B 10/10/2012 1:42 PM

## 2012-10-10 NOTE — Progress Notes (Signed)
Psychoeducational Group Note  Date:  10/10/2012 Time:  9:30am  Group Topic/Focus:  Recovery Goals:   The focus of this group is to identify appropriate goals for recovery and establish a plan to achieve them.  Participation Level:  Did Not Attend   Nile Dear 10/10/2012, 10:12 AM

## 2012-10-11 MED ORDER — IBUPROFEN 600 MG PO TABS
600.0000 mg | ORAL_TABLET | Freq: Four times a day (QID) | ORAL | Status: DC | PRN
  Administered 2012-10-11: 600 mg via ORAL
  Filled 2012-10-11: qty 1

## 2012-10-11 NOTE — Progress Notes (Signed)
Patient ID: Damon [name], male   DOB: November 02, 1966, 46 y.o.   MRN: [id number] D: Patient in room on approach. Pt presented with depressed mood.  On and off self harm thoughts but contracts for safety. Pt continues to have auditory hallucination but denies HI/VH and pain. Pt attended evening wrap up group and  Interacted  appropriately with peers. Calm and cooperative with assessment. No acute distressed noted at this time.  Pt encouraged to come to staff with any question or concerns  A: Meet with pt 1:1. Medications administered as prescribed. Safety has been maintained with Q15 minutes observation. Supported and encouragement provided to attend groups.  R: Patient remains safe. He is complaint with medications and group programming. Safety has been maintained Q15 and continue current POC.

## 2012-10-11 NOTE — Clinical Social Work Note (Signed)
Pt was seen this morning at discharge planning group.  Pt stated that he slept and ate well.  Pt rates his depression at a five and his anxiety at a four.  Pt denies SI/HI, yet he endorses VH.

## 2012-10-11 NOTE — Progress Notes (Signed)
Psychoeducational Group Note  Date:  10/11/2012 Time:  1100  Group Topic/Focus:  Self Care:   The focus of this group is to help patients understand the importance of self-care in order to improve or restore emotional, physical, spiritual, interpersonal, and financial health.  Participation Level:  Minimal  Participation Quality:  Resistant  Affect:  Flat  Cognitive:  Appropriate  Insight:  Limited  Engagement in Group:  Limited  Additional Comments:  Pt was not feeling good, pt still attended.  Damon [name] M 10/11/2012, 11:45 AM

## 2012-10-11 NOTE — Progress Notes (Signed)
  D) Patient pleasant and cooperative upon my assessment. Patient states slept " fair," and  appetite is "good." Patient rates depression as  6 /10, patient rates hopeless feelings as  5/10. Patient denies HI. Patient endorses passive SI, verbally contracts for safety with RN. Patient endorses A/V hallucinations, verbalizes visual hallucinations are "less today," auditory voice continues only "I can't understand what they are saying."   A) Patient offered support and encouragement, patient encouraged to discuss feelings/concerns with staff. Patient verbalized understanding. Patient monitored Q15 minutes for safety. Patient met with MD to discuss today's goals and plan of care.  R) Patient active on unit, attending some groups in day room and meals in dining room.  Patient taking medications as ordered. Will continue to monitor.

## 2012-10-11 NOTE — Progress Notes (Signed)
Patient ID: Damon [name], male   DOB: 1966-06-14, 46 y.o.   MRN: [id number]   PER STATE REGULATIONS 482.30  THIS CHART WAS REVIEWED FOR MEDICAL NECESSITY WITH RESPECT TO THE PATIENT'S ADMISSION/ DURATION OF STAY.  NEXT REVIEW DATE: 10/13/2012  Willa Rough, RN, BSN CASE MANAGER

## 2012-10-11 NOTE — Progress Notes (Signed)
The focus of this group is to help patients review their daily goal of treatment and discuss progress on daily workbooks. Pt attended the evening group and participated in the discussion about finding positives and expressing needs. Pt reported that he felt he made progress today toward planning his discharge, which he is looking forward to. Pt appeared positive and upbeat.

## 2012-10-11 NOTE — Progress Notes (Signed)
Patient ID: Damon [name], male   DOB: 1966/10/10, 46 y.o.   MRN: [id number] [facility] MD Progress Note  10/11/2012 10:44 AM Damon [name]  MRN:  [id number]  Diagnosis:  Axis I: Chronic Paranoid Schizophrenia  ADL's:  Intact  Sleep: Fair  Appetite:  Fair  Suicidal Ideation:  Plan:  YES Intent:  NONE Means:  NONE Homicidal Ideation:  Plan:  none Intent:  none Means:  none  AEB (as evidenced by):  Subjective: Patient seen this morning and chart was reviewed. He reports ongoing auditory and visual hallucinations. Patient reports decreasing paranoia and depressive symptoms. However, he  reports on and off suicidal thoughts trigger by the voices in his head. Patient is compliant with his medications and has not reports any adverse reactions to it.  Mental Status Examination/Evaluation: Objective:  Appearance: Casual and Fairly Groomed  Patent attorney::  Fair  Speech:  Clear and Coherent  Volume:  Normal  Mood:  Dysphoric  Affect:  Constricted  Thought Process:  Coherent and Linear  Orientation:  Full  Thought Content:  Delusions, Hallucinations: Auditory Command:  hearing voices telling him to hurt himself Visual and Paranoid Ideation  Suicidal Thoughts:  Yes.  without intent/plan  Homicidal Thoughts:  No  Memory:  Immediate;   Fair Recent;   Fair Remote;   Fair  Judgement:  Impaired  Insight:  Lacking  Psychomotor Activity:  Normal  Concentration:  Fair  Recall:  Fair  Akathisia:  No  Handed:  Right  AIMS (if indicated):     Assets:  Communication Skills Desire for Improvement  Sleep:  Number of Hours: 6.5    Vital Signs:Blood pressure 125/81, pulse 96, temperature 98.3 F (36.8 C), temperature source Oral, resp. rate 16, height 5\' 10"  (1.778 m), weight 74.39 kg (164 lb). Current Medications: Current Facility-Administered Medications  Medication Dose Route Frequency Provider Last Rate Last Dose  . alum & mag hydroxide-simeth (MAALOX/MYLANTA) 200-200-20 MG/5ML  suspension 30 mL  30 mL Oral Q4H PRN Jorje Guild, PA-C      . benztropine (COGENTIN) tablet 1 mg  1 mg Oral QHS Surina Storts   1 mg at 10/10/12 2111  . FLUoxetine (PROZAC) capsule 20 mg  20 mg Oral Daily Alenna Russell   20 mg at 10/11/12 0748  . ibuprofen (ADVIL,MOTRIN) tablet 200 mg  200 mg Oral Q6H PRN Jorje Guild, PA-C   200 mg at 10/11/12 0644  . magnesium hydroxide (MILK OF MAGNESIA) suspension 30 mL  30 mL Oral Daily PRN Jorje Guild, PA-C      . nicotine (NICODERM CQ - dosed in mg/24 hours) patch 21 mg  21 mg Transdermal Daily Jorje Guild, PA-C   21 mg at 10/11/12 0748  . risperiDONE (RISPERDAL) tablet 2 mg  2 mg Oral QHS Jorje Guild, PA-C   2 mg at 10/10/12 2111  . traZODone (DESYREL) tablet 50 mg  50 mg Oral QHS Jorje Guild, PA-C   50 mg at 10/10/12 2111    Lab Results:  No results found for this or any previous visit (from the past 48 hour(s)).  Physical Findings: AIMS: Facial and Oral Movements Muscles of Facial Expression: None, normal Lips and Perioral Area: None, normal Jaw: None, normal Tongue: None, normal,Extremity Movements Upper (arms, wrists, hands, fingers): None, normal Lower (legs, knees, ankles, toes): None, normal, Trunk Movements Neck, shoulders, hips: None, normal, Overall Severity Severity of abnormal movements (highest score from questions above): None, normal Incapacitation due to abnormal movements: None, normal Patient's awareness  of abnormal movements (rate only patient's report): No Awareness, Dental Status Current problems with teeth and/or dentures?: No Does patient usually wear dentures?: No  CIWA:    COWS:     Treatment Plan Summary: Daily contact with patient to assess and evaluate symptoms and progress in treatment Medication management  Plan: 1. I will increase Risperdal to 3mg  twice daily to  address delusions and psychosis 2. I will continue Prozac 20mg  daily to address depressive symptoms.  Thedore Mins, MD 10/11/2012, 10:44 AM

## 2012-10-12 DIAGNOSIS — F29 Unspecified psychosis not due to a substance or known physiological condition: Secondary | ICD-10-CM

## 2012-10-12 MED ORDER — IBUPROFEN 800 MG PO TABS
800.0000 mg | ORAL_TABLET | Freq: Four times a day (QID) | ORAL | Status: DC | PRN
  Administered 2012-10-12: 800 mg via ORAL
  Filled 2012-10-12: qty 1

## 2012-10-12 MED ORDER — LORATADINE 10 MG PO TABS
10.0000 mg | ORAL_TABLET | Freq: Every day | ORAL | Status: DC
  Administered 2012-10-12 – 2012-10-13 (×2): 10 mg via ORAL
  Filled 2012-10-12 (×4): qty 1
  Filled 2012-10-12: qty 3

## 2012-10-12 MED ORDER — MIRTAZAPINE 15 MG PO TABS
15.0000 mg | ORAL_TABLET | Freq: Every day | ORAL | Status: DC
  Administered 2012-10-12: 15 mg via ORAL
  Filled 2012-10-12: qty 3
  Filled 2012-10-12 (×2): qty 1

## 2012-10-12 NOTE — Progress Notes (Signed)
[facility] MD Progress Note  10/12/2012 9:41 AM Damon [name]  MRN:  [id number]  Diagnosis:   Axis I: Paranoid schizophrenia, psychosis Axis II: Deferred Axis III:  Past Medical History  Diagnosis Date  . Paranoid schizophrenia   . Depression    Axis IV: occupational problems, other psychosocial or environmental problems, problems related to social environment and problems with primary support group Axis V: 21-30 behavior considerably influenced by delusions or hallucinations OR serious impairment in judgment, communication OR inability to function in almost all areas  ADL's:  Intact  Sleep: Good  Appetite:  Good  Suicidal Ideation:  Denies Homicidal Ideation:  Denies  Mental Status Examination/Evaluation: Objective:  Appearance: Casual  Eye Contact::  Good  Speech:  Normal Rate  Volume:  Normal  Mood:  Euthymic  Affect:  Appropriate  Thought Process:  Coherent  Orientation:  Full  Thought Content:  Hallucinations: Auditory Visual  Suicidal Thoughts:  Passive, on and off, less frequent, contracts for safety  Homicidal Thoughts:  No  Memory:  Immediate;   Fair Recent;   Fair Remote;   Fair  Judgement:  Fair  Insight:  Fair  Psychomotor Activity:  Decreased  Concentration:  Fair  Recall:  Fair  Akathisia:  No  Handed:  Right  AIMS (if indicated):     Assets:  Communication Skills Desire for Improvement Physical Health Resilience  Sleep:  Number of Hours: 4.25    Vital Signs:Blood pressure 141/96, pulse 87, temperature 98.3 F (36.8 C), temperature source Oral, resp. rate 16, height 5\' 10"  (1.778 m), weight 74.39 kg (164 lb). Current Medications: Current Facility-Administered Medications  Medication Dose Route Frequency Provider Last Rate Last Dose  . alum & mag hydroxide-simeth (MAALOX/MYLANTA) 200-200-20 MG/5ML suspension 30 mL  30 mL Oral Q4H PRN Jorje Guild, PA-C   30 mL at 10/11/12 1320  . benztropine (COGENTIN) tablet 1 mg  1 mg Oral QHS Mojeed Akintayo    1 mg at 10/11/12 2147  . FLUoxetine (PROZAC) capsule 20 mg  20 mg Oral Daily Mojeed Akintayo   20 mg at 10/12/12 0751  . ibuprofen (ADVIL,MOTRIN) tablet 800 mg  800 mg Oral Q6H PRN Nanine Means, NP      . loratadine (CLARITIN) tablet 10 mg  10 mg Oral Daily Nanine Means, NP      . magnesium hydroxide (MILK OF MAGNESIA) suspension 30 mL  30 mL Oral Daily PRN Jorje Guild, PA-C      . mirtazapine (REMERON) tablet 15 mg  15 mg Oral QHS Nanine Means, NP      . nicotine (NICODERM CQ - dosed in mg/24 hours) patch 21 mg  21 mg Transdermal Daily Jorje Guild, PA-C   21 mg at 10/12/12 0751  . risperiDONE (RISPERDAL) tablet 2 mg  2 mg Oral QHS Jorje Guild, PA-C   2 mg at 10/11/12 2146  . [DISCONTINUED] ibuprofen (ADVIL,MOTRIN) tablet 200 mg  200 mg Oral Q6H PRN Jorje Guild, PA-C   200 mg at 10/11/12 0644  . [DISCONTINUED] ibuprofen (ADVIL,MOTRIN) tablet 600 mg  600 mg Oral Q6H PRN Mojeed Akintayo   600 mg at 10/11/12 1320  . [DISCONTINUED] traZODone (DESYREL) tablet 50 mg  50 mg Oral QHS Jorje Guild, PA-C   50 mg at 10/11/12 2146    Lab Results: No results found for this or any previous visit (from the past 48 hour(s)).  Physical Findings: AIMS: Facial and Oral Movements Muscles of Facial Expression: None, normal Lips and Perioral Area: None, normal  Jaw: None, normal Tongue: None, normal,Extremity Movements Upper (arms, wrists, hands, fingers): None, normal Lower (legs, knees, ankles, toes): None, normal, Trunk Movements Neck, shoulders, hips: None, normal, Overall Severity Severity of abnormal movements (highest score from questions above): None, normal Incapacitation due to abnormal movements: None, normal Patient's awareness of abnormal movements (rate only patient's report): No Awareness, Dental Status Current problems with teeth and/or dentures?: No Does patient usually wear dentures?: No  CIWA:    COWS:     Treatment Plan Summary: Daily contact with patient to assess and evaluate symptoms and  progress in treatment Medication management  Plan: Patient complained of a headache, encouraged him to take his ibuprofen--increased his dose, on assessment he stated he usually takes Claritin at home--ordered placed now, [name] reports he has frequent sinus headaches--if the ibuprofen and Claritin do not work--will try other medications.  He also complained of nightmares that started while in the hospital--trazodone discontinued and Remeron 15 mg ordered (after consulting with Dr Dub Mikes) for sleep.  [name] said he "lost his appetite yesterday but it is back to normal" at breakfast, depression a 3/10 (10 is the worst depression ever), denies homicidal ideation, passive-infrequent suicidal ideations-contracts for safety, visual hallucinations are now a blur and much improved since medication started, auditory hallucinations are at a low buzz, affect bright, individual and group therapy continues.  Nanine Means, PMH-NP 10/12/2012, 9:41 AM

## 2012-10-12 NOTE — Progress Notes (Signed)
Psychoeducational Group Note  Date:  10/12/2012 Time:  1100  Group Topic/Focus:  Self Care:   The focus of this group is to help patients understand the importance of self-care in order to improve or restore emotional, physical, spiritual, interpersonal, and financial health.  Participation Level:  Active  Participation Quality:  Appropriate, Sharing and Supportive  Affect:  Appropriate  Cognitive:  Appropriate  Insight:  Good  Engagement in Group:  Good  Additional Comments:  none  Modesty [name], [name] 10/12/2012, 5:28 PM

## 2012-10-12 NOTE — Progress Notes (Signed)
D.  Pt. Pleasant and cooperative.  Denies SI/HI and denies A/V hallucinations.  Contracts for safety.  No concerns voiced at present. A.  Encouragement and support given. R.  Pt. Receptive.

## 2012-10-12 NOTE — Progress Notes (Signed)
D   Pt is pleasant and cooperative   He denies suicidal and homicidal ideation   He reports intermittant voices which are intrusive  In nature   Pt interacts well with others and frequently encourages other patients  Pt attends and participates in groups  A   Verbal support given   Medications administered and effectiveness monitored  Q 15 min checks  R   Pt safe at present

## 2012-10-13 MED ORDER — RISPERIDONE 2 MG PO TABS: 2.0000 mg | ORAL_TABLET | Freq: Every day | ORAL | Status: DC

## 2012-10-13 MED ORDER — LORATADINE 10 MG PO TABS: 10.0000 mg | ORAL_TABLET | Freq: Every day | ORAL | Status: DC

## 2012-10-13 MED ORDER — IBUPROFEN 800 MG PO TABS: 800.0000 mg | ORAL_TABLET | Freq: Four times a day (QID) | ORAL | Status: DC | PRN

## 2012-10-13 MED ORDER — FLUOXETINE HCL 20 MG PO CAPS: 20.0000 mg | ORAL_CAPSULE | Freq: Every day | ORAL | Status: DC

## 2012-10-13 MED ORDER — BENZTROPINE MESYLATE 1 MG PO TABS: 1.0000 mg | ORAL_TABLET | Freq: Every day | ORAL | Status: DC

## 2012-10-13 MED ORDER — MIRTAZAPINE 15 MG PO TABS: 15.0000 mg | ORAL_TABLET | Freq: Every day | ORAL | Status: DC

## 2012-10-13 NOTE — Progress Notes (Signed)
Pt discharged per MD orders; pt currently denies SI/HI and auditory/visual hallucinations; pt was given education by RN regarding follow-up appointments and medications and pt denied any questions or concerns about these instructions; pt was then escorted to search room to retrieve his belongings by RN before being discharged to hospital lobby. 

## 2012-10-13 NOTE — Discharge Summary (Signed)
Physician Discharge Summary Note  Patient:  Damon [name] is an 46 y.o., male MRN:  [id number] DOB:  01-Mar-1966 Patient phone:  [phone] (home)  Patient address:   [address] [place] [address],   Date of Admission:  10/07/2012 Date of Discharge: 10/13/2012  Reason for Admission:  Paranoid schizophrenia with command hallucinations to kill himself  Discharge Diagnoses: Principal Problem:  *Schizophrenia  Axis Diagnosis:  AXIS I:  Paranoid schizophrenia AXIS II:  Deferred AXIS III:   Past Medical History  Diagnosis Date  . Paranoid schizophrenia   . Depression    AXIS IV:  economic problems, other psychosocial or environmental problems, problems related to social environment and problems with primary support group AXIS V:  51-60 moderate symptoms  Level of Care:  OP  Hospital Course:   Patient attended individual and group therapy, one-one time with MD daily, medications for schizophrenia and psychosis managed during inpatient, follow-up appointments made prior to discharge   Consults:  None  Significant Diagnostic Studies:  labs: Completed and reviewed, stable  Discharge Vitals:   Blood pressure 134/95, pulse 118, temperature 98.6 F (37 C), temperature source Oral, resp. rate 20, height 5\' 10"  (1.778 m), weight 74.39 kg (164 lb). Lab Results:   No results found for this or any previous visit (from the past 72 hour(s)).  Physical Findings: AIMS: Facial and Oral Movements Muscles of Facial Expression: None, normal Lips and Perioral Area: None, normal Jaw: None, normal Tongue: None, normal,Extremity Movements Upper (arms, wrists, hands, fingers): None, normal Lower (legs, knees, ankles, toes): None, normal, Trunk Movements Neck, shoulders, hips: None, normal, Overall Severity Severity of abnormal movements (highest score from questions above): None, normal Incapacitation due to abnormal movements: None, normal Patient's awareness of  abnormal movements (rate only patient's report): No Awareness, Dental Status Current problems with teeth and/or dentures?: No Does patient usually wear dentures?: No  CIWA:    COWS:     Mental Status Exam: See Mental Status Examination and Suicide Risk Assessment completed by Attending Physician prior to discharge.  Discharge destination:  Home  Is patient on multiple antipsychotic therapies at discharge:  No   Has Patient had three or more failed trials of antipsychotic monotherapy by history:  No Recommended Plan for Multiple Antipsychotic Therapies: N/A  Discharge Orders    Future Orders Please Complete By Expires   Diet - low sodium heart healthy      Activity as tolerated - No restrictions          Medication List     As of 10/13/2012 10:40 AM    STOP taking these medications         traZODone 50 MG tablet   Commonly known as: DESYREL      TAKE these medications      Indication    benztropine 1 MG tablet   Commonly known as: COGENTIN   Take 1 tablet (1 mg total) by mouth at bedtime.    Indication: Extrapyramidal Reaction caused by Medications      FLUoxetine 20 MG capsule   Commonly known as: PROZAC   Take 1 capsule (20 mg total) by mouth daily.    Indication: Depression      ibuprofen 800 MG tablet   Commonly known as: ADVIL,MOTRIN   Take 1 tablet (800 mg total) by mouth every 6 (six) hours as needed for pain.       loratadine 10 MG tablet   Commonly known as: CLARITIN  Take 1 tablet (10 mg total) by mouth daily.    Indication: Hayfever      mirtazapine 15 MG tablet   Commonly known as: REMERON   Take 1 tablet (15 mg total) by mouth at bedtime.    Indication: Trouble Sleeping      risperiDONE 2 MG tablet   Commonly known as: RISPERDAL   Take 1 tablet (2 mg total) by mouth at bedtime.       risperiDONE 2 MG tablet   Commonly known as: RISPERDAL   Take 1 tablet (2 mg total) by mouth at bedtime.    Indication: Schizophrenia             Follow-up Information    Follow up with [facility]. (Walk in M-F between 8 and 9 for your hospital follow up appointment)    Contact information:   [address] [place]  [phone]        Follow-up recommendations:  Activity as tolerated, low-sodium heart healthy diet   Comments:  Patient denied suicidal/homicidal ideations and auditory/visual hallucinations are at a minimum-a tolerable level for the patient, follow-up appointments encouraged to attend, follow-up at [facility]  Signed: Nanine Means, PMH-NP 10/13/2012, 10:40 AM

## 2012-10-13 NOTE — Progress Notes (Signed)
[facility] Case Management Discharge Plan:  Will you be returning to the same living situation after discharge: Yes,  home At discharge, do you have transportation home?:Yes,  bus pass Do you have the ability to pay for your medications:Yes,  mental health  Interagency Information:     Release of information consent forms completed and in the chart;  Patient's signature needed at discharge.  Patient to Follow up at:  Follow-up Information    Follow up with [facility]. On 10/18/2012. (You have the time in your information at home)    Contact information:   [address] [place]  [phone]         Patient denies SI/HI:   Yes,  yes    Safety Planning and Suicide Prevention discussed:  Yes,  yes  Barrier to discharge identified:No.  Summary and Recommendations:   Damon [name] 10/13/2012, 11:21 AM

## 2012-10-13 NOTE — Clinical Social Work Note (Signed)
[facility] Group Notes:  (Counselor/Nursing/MHT/Case Management/Adjunct)  10/13/2012   Type of Therapy:  Group Therapy  Participation Level:  Active  Participation Quality:  Appropriate, Attentive and Sharing  Affect:  Appropriate  Cognitive:  Alert, Appropriate and Oriented  Insight:  Good  Engagement in Group:  Good  Engagement in Therapy:  Good  Modes of Intervention:  Clarification, Support and Exploration  Summary of Progress/Problems:The group session focus allowed each participant to share what brought them in to the hospital and how they might prevent future hospitalizations.  Seanmichael shared that changes in medication led to his admit yet he is also aware of some behavior that can lead to better outcomes for him.  He reports "that increasing my motivation is important in order for me seek help and to communicate better with those people that can help.    Clide Dales 10/13/2012

## 2012-10-13 NOTE — [facility] Suicide Risk Assessment (Signed)
Suicide Risk Assessment  Discharge Assessment     Demographic Factors:  NA  Mental Status Per Nursing Assessment::   On Admission:  Suicidal ideation indicated by patient;Suicide plan;Plan includes specific time, place, or method;Self-harm thoughts  Current Mental Status by Physician: In full contact with reality. The voices are almost gone. The medications are helping him and he intends to continue taking them. He denies suicidal ideas, plans or intent. He will be going home. He has some good coping strategies to deal with conflictive interactions of his family members   Loss Factors: NA  Historical Factors: NA  Risk Reduction Factors:   Positive social support, Positive therapeutic relationship and Positive coping skills or problem solving skills  Continued Clinical Symptoms:  Schizophrenia:   Paranoid or undifferentiated type  Cognitive Features That Contribute To Risk:  None indentified  Suicide Risk:  Minimal: No identifiable suicidal ideation.  Patients presenting with no risk factors but with morbid ruminations; may be classified as minimal risk based on the severity of the depressive symptoms  Discharge Diagnoses:   AXIS I:  Schizophrenia Paranoid Type AXIS II:  Deferred AXIS III:   Past Medical History  Diagnosis Date  . Paranoid schizophrenia   . Depression    AXIS IV:  None identified AXIS V:  61-70 mild symptoms  Plan Of Care/Follow-up recommendations:  Activity:  As tolerated Diet:  Regular Follow up with [facility] Is patient on multiple antipsychotic therapies at discharge:  No   Has Patient had three or more failed trials of antipsychotic monotherapy by history:  No  Recommended Plan for Multiple Antipsychotic Therapies: N/A   Damon [name] A 10/13/2012, 2:51 PM

## 2012-10-13 NOTE — Progress Notes (Signed)
Psychoeducational Group Note  Date:  10/13/2012 Time:  1100  Group Topic/Focus:  Relapse Prevention Planning:   The focus of this group is to define relapse and discuss the need for planning to combat relapse.  Participation Level:  Active  Participation Quality:  Appropriate  Affect:  Appropriate  Cognitive:  Appropriate  Insight:  Good  Engagement in Group:  Good  Additional Comments:  Pt participated and processed in group.  Marquis Lunch, Oren Barella 10/13/2012, 1:11 PM

## 2012-10-13 NOTE — Progress Notes (Signed)
Psychoeducational Group Note  Date:  10/13/2012 Time:  2000  Group Topic/Focus:  Karaoke   Participation Level:  Minimal  Participation Quality:  Appropriate  Affect:  Appropriate  Cognitive:  Appropriate  Insight:  Limited  Engagement in Group:  Limited  Additional Comments:  Pt attended karaoke this evening but didn't participated in group.    Damon [name] A 10/13/2012, 2:27 AM

## 2012-10-13 NOTE — Progress Notes (Signed)
[facility] Adult Inpatient Family/Significant Other Suicide Prevention Education  Suicide Prevention Education:  Education Completed; Damon [name], father, [phone] has been identified by the patient as the family member/significant other with whom the patient will be residing, and identified as the person(s) who will aid the patient in the event of a mental health crisis (suicidal ideations/suicide attempt).  With written consent from the patient, the family member/significant other has been provided the following suicide prevention education, prior to the and/or following the discharge of the patient.  The suicide prevention education provided includes the following:  Suicide risk factors  Suicide prevention and interventions  National Suicide Hotline telephone number  [facility] assessment telephone number  [facility] Damon [name] Emergency Assistance 911  [facility] ([facility]) and/or Residential Mobile Crisis Unit telephone number  Request made of family/significant other to:  Remove weapons (e.g., guns, rifles, knives), all items previously/currently identified as safety concern.    Remove drugs/medications (over-the-counter, prescriptions, illicit drugs), all items previously/currently identified as a safety concern.  The family member/significant other verbalizes understanding of the suicide prevention education information provided.  The family member/significant other agrees to remove the items of safety concern listed above.  Damon [name] does not stay with his father.  He has his own apartment.  His father does not believe he has access to weapons, but cannot say for certain.  They last saw each other several months ago when Damon [name] worked with his father on a job.  Damon [name] was willing to listen to SPE, but also pointed out that when he does not see his son regularly, he does not know how he is doing. The good news, he pointed out, is that his son has a history of getting himself to the  hospital when things are not going well, and he shares our concern that Damon [name] is fairly isolative.  States that Damon [name] used to stay with father's sister, but got his own place a couple of years ago.  Agreed to go by his house next week to check on him.  Damon [name] B 10/13/2012, 1:34 PM

## 2012-10-13 NOTE — Clinical Social Work Note (Signed)
[facility] Group Notes:  (Counselor/Nursing/MHT/Case Management/Adjunct)  10/13/2012   Type of Therapy:  Group Therapy  Participation Level:  Active  Participation Quality:  Appropriate  Affect:  Appropriate  Cognitive:  Alert  Insight:  Good  Engagement in Group:  Good  Engagement in Therapy:  Good  Modes of Intervention:  Support  Summary of Progress/Problems: The main focus of process group therapy was for each member to process what led them to being hospitalized and then to identify ways to prevent future hospitalizations. The patient stated that he admitted to the hospital due his medication being adjusted which brought on the onset of him "hearing voices."  In order to prevent future hospitalization he stated that he was going to reach out for help immediately by calling someone when he felt himself losing control.   Karis Rilling Claudette Laws, [place] 10/13/2012 2:42 PM

## 2012-10-13 NOTE — Progress Notes (Signed)
D: Patient denies SI/HI and auditory and visual hallucinations. The patient has an anxious mood and affect. The patient rates his depression and hopelessness both a 1 out of 10 (1 low/10 high). The patient reports sleeping well and states that his appetite is good and his energy level is normal. The patient is attending groups and is interacting appropriately within the milieu.  A: Patient given emotional support from RN. Patient encouraged to come to staff with concerns and/or questions. Patient's medication routine continued. Patient's orders and plan of care reviewed.  R: Patient remains appropriate and cooperative. Will continue to monitor patient q15 minutes for safety.

## 2012-10-17 NOTE — Progress Notes (Signed)
Patient Discharge Instructions:  After Visit Summary (AVS):   Faxed to:  10/17/12 Psychiatric Admission Assessment Note:   Faxed to:  10/17/12 Suicide Risk Assessment - Discharge Assessment:   Faxed to:  10/17/12 Faxed/Sent to the Next Level Care provider:  10/17/12 Faxed to [facility] @ [phone]  Jerelene Redden, 10/17/2012, 3:50 PM

## 2012-10-20 ENCOUNTER — Emergency Department (HOSPITAL_COMMUNITY): Payer: Medicare Other

## 2012-10-20 ENCOUNTER — Encounter (HOSPITAL_COMMUNITY): Payer: Self-pay | Admitting: Emergency Medicine

## 2012-10-20 ENCOUNTER — Emergency Department (HOSPITAL_COMMUNITY)
Admission: EM | Admit: 2012-10-20 | Discharge: 2012-10-20 | Disposition: A | Discharge status: Home / Self Care | Payer: Medicare Other | Attending: Emergency Medicine | Admitting: Emergency Medicine

## 2012-10-20 DIAGNOSIS — F29 Unspecified psychosis not due to a substance or known physiological condition: Secondary | ICD-10-CM | POA: Diagnosis not present

## 2012-10-20 DIAGNOSIS — F200 Paranoid schizophrenia: Secondary | ICD-10-CM | POA: Insufficient documentation

## 2012-10-20 DIAGNOSIS — I309 Acute pericarditis, unspecified: Secondary | ICD-10-CM | POA: Diagnosis not present

## 2012-10-20 DIAGNOSIS — K0889 Other specified disorders of teeth and supporting structures: Secondary | ICD-10-CM

## 2012-10-20 DIAGNOSIS — F3289 Other specified depressive episodes: Secondary | ICD-10-CM | POA: Insufficient documentation

## 2012-10-20 DIAGNOSIS — F17200 Nicotine dependence, unspecified, uncomplicated: Secondary | ICD-10-CM | POA: Diagnosis not present

## 2012-10-20 DIAGNOSIS — J984 Other disorders of lung: Secondary | ICD-10-CM | POA: Diagnosis not present

## 2012-10-20 DIAGNOSIS — Z79899 Other long term (current) drug therapy: Secondary | ICD-10-CM | POA: Insufficient documentation

## 2012-10-20 DIAGNOSIS — F329 Major depressive disorder, single episode, unspecified: Secondary | ICD-10-CM | POA: Insufficient documentation

## 2012-10-20 DIAGNOSIS — K089 Disorder of teeth and supporting structures, unspecified: Secondary | ICD-10-CM | POA: Diagnosis not present

## 2012-10-20 DIAGNOSIS — I319 Disease of pericardium, unspecified: Secondary | ICD-10-CM | POA: Diagnosis not present

## 2012-10-20 DIAGNOSIS — R079 Chest pain, unspecified: Secondary | ICD-10-CM | POA: Diagnosis not present

## 2012-10-20 DIAGNOSIS — R29818 Other symptoms and signs involving the nervous system: Secondary | ICD-10-CM | POA: Diagnosis not present

## 2012-10-20 DIAGNOSIS — R51 Headache: Secondary | ICD-10-CM | POA: Insufficient documentation

## 2012-10-20 LAB — D-DIMER, QUANTITATIVE: D-Dimer, Quant: <0.27 ug/mL-FEU (ref 0.00–0.48)

## 2012-10-20 LAB — COMPREHENSIVE METABOLIC PANEL
ALT: 28 U/L (ref 0–53)
AST: 26 U/L (ref 0–37)
Albumin: 4 g/dL (ref 3.5–5.2)
Alkaline Phosphatase: 57 U/L (ref 39–117)
Potassium: 3.7 mEq/L (ref 3.5–5.1)
Sodium: 138 mEq/L (ref 135–145)
Total Protein: 7.6 g/dL (ref 6.0–8.3)

## 2012-10-20 LAB — CBC WITH DIFFERENTIAL/PLATELET
Basophils Absolute: 0 10*3/uL (ref 0.0–0.1)
Basophils Relative: 0 % (ref 0–1)
Eosinophils Absolute: 0.3 10*3/uL (ref 0.0–0.7)
MCH: 30.2 pg (ref 26.0–34.0)
MCHC: 34.4 g/dL (ref 30.0–36.0)
Neutrophils Relative %: 49 % (ref 43–77)
Platelets: 327 10*3/uL (ref 150–400)

## 2012-10-20 LAB — POCT I-STAT TROPONIN I: Troponin i, poc: 0 ng/mL (ref 0.00–0.08)

## 2012-10-20 MED ORDER — KETOROLAC TROMETHAMINE 30 MG/ML IJ SOLN
30.0000 mg | Freq: Once | INTRAMUSCULAR | Status: AC
  Administered 2012-10-20: 30 mg via INTRAVENOUS
  Filled 2012-10-20: qty 1

## 2012-10-20 MED ORDER — PENICILLIN V POTASSIUM 500 MG PO TABS: 500.0000 mg | ORAL_TABLET | Freq: Three times a day (TID) | ORAL | Status: DC

## 2012-10-20 MED ORDER — IBUPROFEN 400 MG PO TABS: 400.0000 mg | ORAL_TABLET | Freq: Three times a day (TID) | ORAL | Status: DC

## 2012-10-20 MED ORDER — MORPHINE SULFATE 4 MG/ML IJ SOLN
4.0000 mg | Freq: Once | INTRAMUSCULAR | Status: AC
  Administered 2012-10-20: 4 mg via INTRAVENOUS
  Filled 2012-10-20: qty 1

## 2012-10-20 MED ORDER — HYDROCODONE-ACETAMINOPHEN 5-325 MG PO TABS: 1.0000 | ORAL_TABLET | ORAL | Status: DC | PRN

## 2012-10-20 NOTE — ED Notes (Signed)
PT. ARRIVED WITH EMS FROM [facility] LEFT CHEST PAIN AND RIGHT SIDE HEADACHE ONSET THIS EVENING , SLIGHT SOB , NO COUGH, DENIES NAUSEA OR DIAPHORESIS.

## 2012-10-20 NOTE — ED Provider Notes (Signed)
Medical screening examination/treatment/procedure(s) were performed by non-physician practitioner and as supervising physician I was immediately available for consultation/collaboration.  Robertine Kipper, MD 10/20/12 0802 

## 2012-10-20 NOTE — ED Notes (Signed)
TRANSPORTED TO CT SCAN.  

## 2012-10-20 NOTE — ED Notes (Signed)
TRANSPORTED TO X-RAY. 

## 2012-10-20 NOTE — Discharge Summary (Signed)
Agree with assessment and plan Damon [name] A. Damon [name], M.D. 

## 2012-10-20 NOTE — Progress Notes (Signed)
Agree with assessment and plan Jaiyah Beining A. Crew Goren, M.D. 

## 2012-10-20 NOTE — ED Provider Notes (Signed)
History     CSN: [id number]  Arrival date & time 10/20/12  4098   First MD Initiated Contact with Patient 10/20/12 0409      Chief Complaint  Patient presents with  . Chest Pain    (Consider location/radiation/quality/duration/timing/severity/associated sxs/prior treatment) HPI History provided by pt.   Pt presents w/ multiple complaints.  Developed gradual onset severe right-sided headache approx 1 hour ago.  Pain radiates into right side of jaw and is aggravated by palpation and opening mouth.  Developed weakness of entire left side of body around the same time.  Denies head trauma.  No FH CVA.  Also c/o severe, intermittent, pleuritic, non-radiating, non-exertional CP since 11pm yesterday.  Aggravated by laying on left side and improves immediately when he sits up straight.  No recent fever, coughing, LE edema or pain.  No prior h/o pericarditis.  No RF for PE.   Past Medical History  Diagnosis Date  . Paranoid schizophrenia   . Depression     History reviewed. No pertinent past surgical history.  No family history on file.  History  Substance Use Topics  . Smoking status: Current Every Day Smoker -- 0.5 packs/day  . Smokeless tobacco: Current User    Types: Chew  . Alcohol Use: 0.6 oz/week    1 Cans of beer per week     Comment: one can of beer weekly      Review of Systems  All other systems reviewed and are negative.    Allergies  Review of patient's allergies indicates no known allergies.  Home Medications   Current Outpatient Rx  Name  Route  Sig  Dispense  Refill  . BENZTROPINE MESYLATE 1 MG PO TABS   Oral   Take 1 tablet (1 mg total) by mouth at bedtime.   30 tablet   0   . FLUOXETINE HCL 20 MG PO CAPS   Oral   Take 1 capsule (20 mg total) by mouth daily.   30 capsule   0   . IBUPROFEN 800 MG PO TABS   Oral   Take 1 tablet (800 mg total) by mouth every 6 (six) hours as needed for pain.   30 tablet   0   . LORATADINE 10 MG PO TABS    Oral   Take 1 tablet (10 mg total) by mouth daily.   30 tablet   0   . MIRTAZAPINE 15 MG PO TABS   Oral   Take 1 tablet (15 mg total) by mouth at bedtime.   30 tablet   0   . RISPERIDONE 2 MG PO TABS   Oral   Take 1 tablet (2 mg total) by mouth at bedtime.   30 tablet   0     BP 164/112  Temp 98.6 F (37 C) (Oral)  Resp 18  SpO2 100%  Physical Exam  Nursing note and vitals reviewed. Constitutional: He is oriented to person, place, and time. He appears well-developed and well-nourished. No distress.  HENT:  Head: Normocephalic and atraumatic.       Severe tenderness right temple.  Jaw claudication.  Nml pharynx.   Eyes:       Normal appearance  Neck: Normal range of motion.  Cardiovascular: Normal rate, regular rhythm and intact distal pulses.   Pulmonary/Chest: Effort normal and breath sounds normal. No respiratory distress. He exhibits no tenderness.       No pleuritic pain reported  Abdominal: Soft. Bowel sounds are normal. He  exhibits no distension. There is no tenderness. There is no guarding.  Musculoskeletal: Normal range of motion.       No peripheral edema or calf tenderness  Lymphadenopathy:    He has no cervical adenopathy.  Neurological: He is alert and oriented to person, place, and time.       CN 3-12 intact.  No sensory deficits.  4/5, symmetric upper and lower extremity strength.     Skin: Skin is warm and dry. No rash noted.  Psychiatric: He has a normal mood and affect. His behavior is normal.    ED Course  Procedures (including critical care time)   Date: 10/20/2012  Rate: 76  Rhythm: normal sinus rhythm  QRS Axis: normal  Intervals: normal  ST/T Wave abnormalities: ST elevations diffusely  Conduction Disutrbances:none  Narrative Interpretation:  Diffuse PR depression  Old EKG Reviewed: unchanged    Labs Reviewed  CBC WITH DIFFERENTIAL  COMPREHENSIVE METABOLIC PANEL  POCT I-STAT TROPONIN I  D-DIMER, QUANTITATIVE  SEDIMENTATION RATE    Dg Chest 2 View  10/20/2012  *RADIOLOGY REPORT*  Clinical Data: Left-sided chest pain.  CHEST - 2 VIEW  Comparison: Chest radiograph performed 02/13/2010  Findings: The lungs are well-aerated and clear.  There is no evidence of focal opacification, pleural effusion or pneumothorax. Minimal apparent nodular densities overlying the left lower lung zone may reflect remote rib injuries.  The heart is normal in size; the mediastinal contour is within normal limits.  No acute osseous abnormalities are seen.  IMPRESSION: No acute cardiopulmonary process seen.   Original Report Authenticated By: Tonia Ghent, M.D.    Ct Head Wo Contrast  10/20/2012  *RADIOLOGY REPORT*  Clinical Data: Right-sided headache for several hours; left-sided weakness.  Mild confusion.  CT HEAD WITHOUT CONTRAST  Technique:  Contiguous axial images were obtained from the base of the skull through the vertex without contrast.  Comparison: CT of the head and MRI of the brain performed 07/24/2004  Findings: There is no evidence of acute infarction, mass lesion, or intra- or extra-axial hemorrhage on CT.  A likely mildly prominent Virchow-Robin space is noted at the left basal ganglia.  The posterior fossa, including the cerebellum, brainstem and fourth ventricle, is within normal limits.  The third and lateral ventricles, and basal ganglia are unremarkable in appearance.  The cerebral hemispheres are symmetric in appearance, with normal gray- white differentiation.  No mass effect or midline shift is seen.  There is no evidence of fracture; visualized osseous structures are unremarkable in appearance.  The visualized portions of the orbits are within normal limits.  The paranasal sinuses and mastoid air cells are well-aerated.  No significant soft tissue abnormalities are seen.  IMPRESSION: Unremarkable noncontrast CT of the head.   Original Report Authenticated By: Tonia Ghent, M.D.      1. Pain, dental   2. Pericarditis       MDM   46yo M presents w/ multiple complaints including gradual onset severe R-sided headache w/ associated L-sided weakness as well as pleuritic L-sided, positional CP.  Exam sig for no fever, severe tenderness of R temple, jaw claudication, no focal neuro deficits (no objective weakness), no meningeal signs, CP reproducible w/ ROM LUE and laying flat.  Labs unremarkable, w/ exception of sed rate (ordered d/t concern for temporal arteritis) which is pending.  CT head and CXR neg.  EKG shows diffuse PR depression.  Pt likely has pericarditis.  Will treat pain w/ toradol and morphine.    Pt  reports pain improved but now most bothersome at right jaw.  He has dental pain and on exam, 1st upper premolar severely decayed and ttp.  Now suspect that head pain is radiated from mouth.  Will prescribed penicillin and refer to dentist.  Will also prescribed ibuprofen and vicodin for likely pericarditis and refer to cardiology.  Close f/u advised and strict return precautions discussed.  Geiple, PA-C to dispo when sed rate results.  6:39 AM        Otilio Miu, PA-C 10/20/12 [phone]

## 2012-12-22 DIAGNOSIS — F29 Unspecified psychosis not due to a substance or known physiological condition: Secondary | ICD-10-CM | POA: Diagnosis not present

## 2012-12-30 ENCOUNTER — Other Ambulatory Visit: Payer: Self-pay

## 2013-01-06 ENCOUNTER — Emergency Department (HOSPITAL_COMMUNITY): Payer: Medicare Other

## 2013-01-06 ENCOUNTER — Encounter (HOSPITAL_COMMUNITY): Payer: Self-pay | Admitting: Emergency Medicine

## 2013-01-06 ENCOUNTER — Emergency Department (HOSPITAL_COMMUNITY)
Admission: EM | Admit: 2013-01-06 | Discharge: 2013-01-08 | Disposition: A | Discharge status: Other Acute Inpatient Hospital | Payer: Medicare Other | Source: Home / Self Care | Attending: Emergency Medicine | Admitting: Emergency Medicine

## 2013-01-06 DIAGNOSIS — R443 Hallucinations, unspecified: Secondary | ICD-10-CM | POA: Diagnosis not present

## 2013-01-06 DIAGNOSIS — R0602 Shortness of breath: Secondary | ICD-10-CM | POA: Diagnosis not present

## 2013-01-06 DIAGNOSIS — R45851 Suicidal ideations: Secondary | ICD-10-CM | POA: Diagnosis not present

## 2013-01-06 DIAGNOSIS — F329 Major depressive disorder, single episode, unspecified: Secondary | ICD-10-CM | POA: Diagnosis not present

## 2013-01-06 DIAGNOSIS — Z79899 Other long term (current) drug therapy: Secondary | ICD-10-CM | POA: Diagnosis not present

## 2013-01-06 DIAGNOSIS — F200 Paranoid schizophrenia: Secondary | ICD-10-CM | POA: Diagnosis not present

## 2013-01-06 DIAGNOSIS — F29 Unspecified psychosis not due to a substance or known physiological condition: Secondary | ICD-10-CM | POA: Diagnosis not present

## 2013-01-06 DIAGNOSIS — R079 Chest pain, unspecified: Secondary | ICD-10-CM | POA: Diagnosis not present

## 2013-01-06 LAB — CBC WITH DIFFERENTIAL/PLATELET
Basophils Absolute: 0 10*3/uL (ref 0.0–0.1)
Basophils Relative: 0 % (ref 0–1)
Eosinophils Absolute: 0.2 10*3/uL (ref 0.0–0.7)
Eosinophils Relative: 2 % (ref 0–5)
Lymphs Abs: 2.1 10*3/uL (ref 0.7–4.0)
MCH: 31 pg (ref 26.0–34.0)
MCHC: 34.7 g/dL (ref 30.0–36.0)
MCV: 89.2 fL (ref 78.0–100.0)
Platelets: 278 10*3/uL (ref 150–400)
RDW: 13 % (ref 11.5–15.5)

## 2013-01-06 LAB — COMPREHENSIVE METABOLIC PANEL
ALT: 19 U/L (ref 0–53)
Calcium: 9.9 mg/dL (ref 8.4–10.5)
GFR calc Af Amer: 73 mL/min — ABNORMAL LOW (ref >90)
Glucose, Bld: 105 mg/dL — ABNORMAL HIGH (ref 70–99)
Sodium: 130 mEq/L — ABNORMAL LOW (ref 135–145)
Total Protein: 7.6 g/dL (ref 6.0–8.3)

## 2013-01-06 LAB — RAPID URINE DRUG SCREEN, HOSP PERFORMED
Amphetamines: NOT DETECTED
Benzodiazepines: NOT DETECTED
Cocaine: NOT DETECTED
Opiates: NOT DETECTED

## 2013-01-06 LAB — ETHANOL: Alcohol, Ethyl (B): <11 mg/dL (ref 0–11)

## 2013-01-06 MED ORDER — LORAZEPAM 1 MG PO TABS: 1.0000 mg | ORAL_TABLET | Freq: Three times a day (TID) | ORAL | Status: DC | PRN

## 2013-01-06 MED ORDER — ZOLPIDEM TARTRATE 5 MG PO TABS: 5.0000 mg | ORAL_TABLET | Freq: Every evening | ORAL | Status: DC | PRN

## 2013-01-06 MED ORDER — ONDANSETRON HCL 8 MG PO TABS: 4.0000 mg | ORAL_TABLET | Freq: Three times a day (TID) | ORAL | Status: DC | PRN

## 2013-01-06 MED ORDER — FLUOXETINE HCL 20 MG PO CAPS
20.0000 mg | ORAL_CAPSULE | Freq: Every day | ORAL | Status: DC
  Administered 2013-01-06 – 2013-01-08 (×3): 20 mg via ORAL
  Filled 2013-01-06 (×3): qty 1

## 2013-01-06 MED ORDER — ACETAMINOPHEN 325 MG PO TABS
650.0000 mg | ORAL_TABLET | ORAL | Status: DC | PRN
  Administered 2013-01-07: 650 mg via ORAL
  Filled 2013-01-06: qty 2

## 2013-01-06 MED ORDER — NICOTINE 21 MG/24HR TD PT24
21.0000 mg | MEDICATED_PATCH | Freq: Every day | TRANSDERMAL | Status: DC
  Administered 2013-01-07 – 2013-01-08 (×2): 21 mg via TRANSDERMAL
  Filled 2013-01-06 (×2): qty 1

## 2013-01-06 MED ORDER — RISPERIDONE 0.5 MG PO TABS
2.0000 mg | ORAL_TABLET | Freq: Every day | ORAL | Status: DC
  Administered 2013-01-06 – 2013-01-07 (×2): 2 mg via ORAL
  Filled 2013-01-06 (×2): qty 4

## 2013-01-06 MED ORDER — BENZTROPINE MESYLATE 1 MG PO TABS
1.0000 mg | ORAL_TABLET | Freq: Every day | ORAL | Status: DC
  Administered 2013-01-06 – 2013-01-07 (×2): 1 mg via ORAL
  Filled 2013-01-06 (×2): qty 1

## 2013-01-06 MED ORDER — MIRTAZAPINE 15 MG PO TABS
15.0000 mg | ORAL_TABLET | Freq: Every day | ORAL | Status: DC
  Administered 2013-01-06 – 2013-01-07 (×2): 15 mg via ORAL
  Filled 2013-01-06 (×4): qty 1

## 2013-01-06 NOTE — ED Provider Notes (Signed)
Date: 01/06/2013  Rate: 79  Rhythm: normal sinus rhythm  QRS Axis: normal  Intervals: normal  ST/T Wave abnormalities: nonspecific ST changes  Conduction Disutrbances:none  Narrative Interpretation:   Old EKG Reviewed: unchanged    Arnoldo Hooker, PA-C 01/06/13 1243

## 2013-01-06 NOTE — ED Notes (Signed)
Pt given paper scrubs and shown to bathroom to change.

## 2013-01-06 NOTE — ED Notes (Signed)
Notified security pt needed to be wanded.

## 2013-01-06 NOTE — BH Assessment (Signed)
[facility] Assessment Progress Note      Pt is a 46 year old male presenting with depressive symptoms-tearfulness, withdrawal, isolation, decreased grooming.  Pt. Report hearing commanding voices instructing him to kill self.  Pt. Reports he has no plan or intent but does not feel safe.  Pt. Reports he is medication and group compliant.  Pt. Reports he had a "nightmare two days ago", and he has not had any sleep since then.  Pt. Reports he is fearful and the voices continue to get louder and louder.  The pt. Reports "I don't sleep at night because I am afraid, and I am crying because I just want the voices to stop".  Pt. Reports last inpatient was a year ago at [facility].  Pt. Reports he spends his time at [facility], the library and out walking.  Pt. Lives alone.  Pt. Reports he is paranoid and suspicious at the moment and feels that inpatient would assist him in getting stabilized.  Pt. Reports symptoms have been worsening in past month and his visits to his therapist has increased.  Pt. Reports no other triggers or stressors.  Pt. Has no HI, SA/ETOH.  Recommendation if for inpatient at [facility].  Pt. Has Axis I DX of Schizoeffective D/O. 

## 2013-01-06 NOTE — ED Notes (Signed)
Dinner tray called in. 

## 2013-01-06 NOTE — BH Assessment (Signed)
[facility] Assessment Progress Note      Pt is a 47 year old male presenting with depressive symptoms-tearfulness, withdrawal, isolation, decreased grooming.  Pt. Report hearing commanding voices instructing him to kill self.  Pt. Reports he has no plan or intent but does not feel safe.  Pt. Reports he is medication and group compliant.  Pt. Reports he had a "nightmare two days ago", and he has not had any sleep since then.  Pt. Reports he is fearful and the voices continue to get louder and louder.  The pt. Reports "I don't sleep at night because I am afraid, and I am crying because I just want the voices to stop".  Pt. Reports last inpatient was a year ago at [facility].  Pt. Reports he spends his time at [facility], the [employer] and out walking.  Pt. Lives alone.  Pt. Reports he is paranoid and suspicious at the moment and feels that inpatient would assist him in getting stabilized.  Pt. Reports symptoms have been worsening in past month and his visits to his therapist has increased.  Pt. Reports no other triggers or stressors.  Pt. Has no HI, SA/ETOH.  Recommendation if for inpatient at [facility].  Pt. Has Axis I DX of Schizoeffective D/O.

## 2013-01-06 NOTE — BH Assessment (Signed)
[facility] Assessment Progress Note  At 18:46 Jacquelyne Balint, RN, Assessment Counselor reports that Assunta Found, FNP has reviewed pt.  She agrees to accept him to [facility] pending availability of a 400 hall bed; currently none are available.  At 18:50 I attempted to call Antonietta Breach, Assessment Counselor, but her phone rolled to voice mail.  I left a message advising her to check this note in EPIC.  Doylene Canning, MA Assessment Counselor 01/06/2013 @ 18:53

## 2013-01-06 NOTE — BH Assessment (Signed)
Assessment Note   Damon [name] is an 47 y.o. male. Pt is a 47 year old male presenting with depressive symptoms-tearfulness, withdrawal, isolation, decreased grooming.  Pt. Report hearing commanding voices instructing him to kill self.  Pt. Reports he has no plan or intent but does not feel safe.  Pt. Reports he is medication and group compliant.  Pt. Reports he had a "nightmare two days ago", and he has not had any sleep since then.  Pt. Reports he is fearful and the voices continue to get louder and louder.  The pt. Reports "I don't sleep at night because I am afraid, and I am crying because I just want the voices to stop".  Pt. Reports last inpatient was a year ago at [facility].  Pt. Reports he spends his time at [facility], the [employer] and out walking.  Pt. Lives alone.  Pt. Reports he is paranoid and suspicious at the moment and feels that inpatient would assist him in getting stabilized.  Pt. Reports symptoms have been worsening in past month and his visits to his therapist has increased.  Pt. Reports no other triggers or stressors.  Pt. Has no HI, SA/ETOH.  Recommendation if for inpatient at [facility].  Pt. Has Axis I DX of Schizoeffective D/O.   Axis I: Schizoaffective Disorder Axis II:  None Axis III:  See Below Axis IV: medication issues Axis V:  30  Past Medical History:  Past Medical History  Diagnosis Date  . Paranoid schizophrenia   . Depression     History reviewed. No pertinent past surgical history.  Family History: No family history on file.  Social History:  reports that he has been smoking.  His smokeless tobacco use includes Chew. He reports that he drinks about 0.6 ounces of alcohol per week. He reports that he does not use illicit drugs.  Additional Social History:     CIWA: CIWA-Ar BP: 113/79 mmHg Pulse Rate: 73 COWS:    Allergies: No Known Allergies  Home Medications:  (Not in a hospital admission)  OB/GYN Status:  No LMP for male patient.  General  Assessment Data Location of Assessment: [facility] ED [facility] Assessment: Yes Living Arrangements: Alone Can pt return to current living arrangement?: Yes Admission Status: Voluntary Is patient capable of signing voluntary admission?: Yes Transfer from: Home Referral Source: Self/Family/Friend  Education Status Is patient currently in school?: No  Risk to self Suicidal Ideation: Yes-Currently Present Suicidal Intent: Yes-Currently Present Is patient at risk for suicide?: Yes Suicidal Plan?: No Access to Means: Yes Specify Access to Suicidal Means: whatever type chosen What has been your use of drugs/alcohol within the last 12 months?: 0 Previous Attempts/Gestures: Yes How many times?: 2 Triggers for Past Attempts: Hallucinations Intentional Self Injurious Behavior: None Family Suicide History: No Recent stressful life event(s): Turmoil (Comment) Persecutory voices/beliefs?: Yes Depression: Yes Depression Symptoms: Tearfulness;Isolating;Loss of interest in usual pleasures;Feeling worthless/self pity Substance abuse history and/or treatment for substance abuse?: No Suicide prevention information given to non-admitted patients: Not applicable  Risk to Others Homicidal Ideation: No Thoughts of Harm to Others: No Current Homicidal Intent: No Current Homicidal Plan: No Access to Homicidal Means: No History of harm to others?: No Assessment of Violence: None Noted Does patient have access to weapons?: No Criminal Charges Pending?: No Does patient have a court date: No  Psychosis Hallucinations: Auditory Delusions: Persecutory  Mental Status Report Appear/Hygiene: Disheveled Eye Contact: Fair Motor Activity: Freedom of movement Speech: Logical/coherent Level of Consciousness: Alert Mood:  Anxious;Ambivalent;Ashamed/humiliated;Despair;Helpless Affect: Appropriate to circumstance Anxiety Level: Minimal Thought Processes: Coherent;Relevant Judgement: Unimpaired Orientation:  Person;Place;Time;Situation Obsessive Compulsive Thoughts/Behaviors: None  Cognitive Functioning Concentration: Normal Memory: Recent Intact;Remote Intact IQ: Average Insight: Fair Impulse Control: Fair Appetite: Fair Weight Loss: 0 Weight Gain: 0 Sleep: Decreased Total Hours of Sleep: 3 Vegetative Symptoms: Staying in bed;Not bathing;Decreased grooming  ADLScreening [facility]) Patient's cognitive ability adequate to safely complete daily activities?: Yes Patient able to express need for assistance with ADLs?: Yes Independently performs ADLs?: Yes (appropriate for developmental age)  Abuse/Neglect [facility]) Physical Abuse: Denies Verbal Abuse: Denies Sexual Abuse: Denies  Prior Inpatient Therapy Prior Inpatient Therapy: Yes Prior Therapy Dates: 2013 Prior Therapy Facilty/Provider(s): [facility] Reason for Treatment: MH  Prior Outpatient Therapy Prior Outpatient Therapy: Yes Prior Therapy Dates: 2014 Prior Therapy Facilty/Provider(s): [facility] Reason for Treatment: MH  ADL Screening (condition at time of admission) Patient's cognitive ability adequate to safely complete daily activities?: Yes Patient able to express need for assistance with ADLs?: Yes Independently performs ADLs?: Yes (appropriate for developmental age)       Abuse/Neglect Assessment (Assessment to be complete while patient is alone) Physical Abuse: Denies Verbal Abuse: Denies Sexual Abuse: Denies          Additional Information 1:1 In Past 12 Months?: No CIRT Risk: No Elopement Risk: No Does patient have medical clearance?: Yes     Disposition: Pending [facility]. Disposition Initial Assessment Completed: Yes Disposition of Patient: Inpatient treatment program Type of inpatient treatment program: Adult  On Site Evaluation by:   Reviewed with Physician:     Barbaraann Boys 01/06/2013 5:08 PM

## 2013-01-06 NOTE — ED Provider Notes (Signed)
History     CSN: [id number]  Arrival date & time 01/06/13  1117   First MD Initiated Contact with Patient 01/06/13 1154      No chief complaint on file.   (Consider location/radiation/quality/duration/timing/severity/associated sxs/prior treatment) Patient is a 47 y.o. male presenting with mental health disorder. The history is provided by the patient.  Mental Health Problem Presenting symptoms: lethargy   Presenting symptoms comment:  Auditory hallucinations, depression Severity:  Moderate Most recent episode:  Today Episode history:  Multiple Progression:  Worsening Associated symptoms: depression, difficulty breathing, hallucinations and headaches   Associated symptoms: no abdominal pain, no fever, no rash and no vomiting   Associated symptoms comment:  Auditory hallucinations have been worsening over the past 3 days. Voices are getting louder and telling him he is alone in the world and to hurt himself. Complains of chest pain lasting 48 hours that is worse when he lays down and tends to get better as the day wears on. Some shortness of breath when he is laying down. Sitting up helps him breath when this happens. Headache in temples - pulsating. This has been an ongoing problem.    Past Medical History  Diagnosis Date  . Paranoid schizophrenia   . Depression     History reviewed. No pertinent past surgical history.  No family history on file.  History  Substance Use Topics  . Smoking status: Current Every Day Smoker -- 0.50 packs/day  . Smokeless tobacco: Current User    Types: Chew  . Alcohol Use: 0.6 oz/week    1 Cans of beer per week     Comment: one can of beer weekly      Review of Systems  Constitutional: Positive for fatigue. Negative for fever.  Respiratory: Positive for shortness of breath.   Cardiovascular: Positive for chest pain. Negative for leg swelling.  Gastrointestinal: Negative for vomiting and abdominal pain.  Skin: Negative for rash.   Neurological: Positive for headaches. Negative for numbness.  Psychiatric/Behavioral: Positive for suicidal ideas, hallucinations and decreased concentration.    Allergies  Review of patient's allergies indicates no known allergies.  Home Medications   Current Outpatient Rx  Name  Route  Sig  Dispense  Refill  . benztropine (COGENTIN) 1 MG tablet   Oral   Take 1 tablet (1 mg total) by mouth at bedtime.   30 tablet   0   . FLUoxetine (PROZAC) 20 MG capsule   Oral   Take 1 capsule (20 mg total) by mouth daily.   30 capsule   0   . mirtazapine (REMERON) 15 MG tablet   Oral   Take 15 mg by mouth at bedtime as needed (insomnia).         . risperiDONE (RISPERDAL) 2 MG tablet   Oral   Take 1 tablet (2 mg total) by mouth at bedtime.   30 tablet   0     BP 109/60  Pulse 101  Temp(Src) 97.8 F (36.6 C) (Oral)  Resp 20  SpO2 98%  Physical Exam  Constitutional: He appears well-developed.  HENT:  Head: Normocephalic and atraumatic.  Eyes: Conjunctivae are normal. Pupils are equal, round, and reactive to light.  Neck: No tracheal deviation present.  Cardiovascular: Normal rate, regular rhythm, normal heart sounds and intact distal pulses.   Pulmonary/Chest: Effort normal and breath sounds normal. No respiratory distress. He has no wheezes. He has no rales.  Abdominal: Soft. Bowel sounds are normal. He exhibits no distension. There is  no tenderness.  Musculoskeletal: He exhibits no edema and no tenderness.  Neurological: He is alert.  Skin: Skin is warm and dry.    ED Course  Procedures (including critical care time)  Labs Reviewed  ETHANOL  URINE RAPID DRUG SCREEN (HOSP PERFORMED)  CBC WITH DIFFERENTIAL  COMPREHENSIVE METABOLIC PANEL   Results for orders placed during the hospital encounter of 01/06/13  ETHANOL      Result Value Range   Alcohol, Ethyl (B) <11  0 - 11 mg/dL  URINE RAPID DRUG SCREEN (HOSP PERFORMED)      Result Value Range   Opiates NONE  DETECTED  NONE DETECTED   Cocaine NONE DETECTED  NONE DETECTED   Benzodiazepines NONE DETECTED  NONE DETECTED   Amphetamines NONE DETECTED  NONE DETECTED   Tetrahydrocannabinol NONE DETECTED  NONE DETECTED   Barbiturates NONE DETECTED  NONE DETECTED  CBC WITH DIFFERENTIAL      Result Value Range   WBC 7.0  4.0 - 10.5 K/uL   RBC 4.55  4.22 - 5.81 MIL/uL   Hemoglobin 14.1  13.0 - 17.0 g/dL   HCT 16.1  09.6 - 04.5 %   MCV 89.2  78.0 - 100.0 fL   MCH 31.0  26.0 - 34.0 pg   MCHC 34.7  30.0 - 36.0 g/dL   RDW 40.9  81.1 - 91.4 %   Platelets 278  150 - 400 K/uL   Neutrophils Relative 59  43 - 77 %   Neutro Abs 4.2  1.7 - 7.7 K/uL   Lymphocytes Relative 30  12 - 46 %   Lymphs Abs 2.1  0.7 - 4.0 K/uL   Monocytes Relative 9  3 - 12 %   Monocytes Absolute 0.6  0.1 - 1.0 K/uL   Eosinophils Relative 2  0 - 5 %   Eosinophils Absolute 0.2  0.0 - 0.7 K/uL   Basophils Relative 0  0 - 1 %   Basophils Absolute 0.0  0.0 - 0.1 K/uL  COMPREHENSIVE METABOLIC PANEL      Result Value Range   Sodium 130 (*) 135 - 145 mEq/L   Potassium 3.7  3.5 - 5.1 mEq/L   Chloride 94 (*) 96 - 112 mEq/L   CO2 27  19 - 32 mEq/L   Glucose, Bld 105 (*) 70 - 99 mg/dL   BUN 13  6 - 23 mg/dL   Creatinine, Ser 7.82  0.50 - 1.35 mg/dL   Calcium 9.9  8.4 - 95.6 mg/dL   Total Protein 7.6  6.0 - 8.3 g/dL   Albumin 3.9  3.5 - 5.2 g/dL   AST 23  0 - 37 U/L   ALT 19  0 - 53 U/L   Alkaline Phosphatase 52  39 - 117 U/L   Total Bilirubin 0.3  0.3 - 1.2 mg/dL   GFR calc non Af Amer 63 (*) >90 mL/min   GFR calc Af Amer 73 (*) >90 mL/min  POCT I-STAT TROPONIN I      Result Value Range   Troponin i, poc 0.00  0.00 - 0.08 ng/mL   Comment 3           Dg Chest 2 View  01/06/2013  *RADIOLOGY REPORT*  Clinical Data:  Chest pain and shortness of breath.  CHEST - 2 VIEW  Comparison: 10/20/2012  Findings: The heart size and mediastinal contours are within normal limits.  Both lungs are clear.  The visualized skeletal structures are  unremarkable.  IMPRESSION:  No active disease.   Original Report Authenticated By: Irish Lack, M.D.   No results found.   No diagnosis found.    MDM  Patient came in stating he has been hearing increasingly louder voices telling him to hurt himself. He complains of a headache and chest pain worse in the morning and some shortness of breath that sometimes wakes him from sleep. His EKG is unchanged, troponin neg, CXR neg for acute disease. Drug screen and EtOH was neg. Mild creatinine elevation likely due to dehydration. Patient was given PO fluids. Medically cleared to go to psych.         Mora Bellman, PA-C 01/06/13 1520

## 2013-01-06 NOTE — ED Notes (Signed)
Started to hear voices 2 days ago thought about hurting himself but did not has hx of schio

## 2013-01-07 NOTE — ED Provider Notes (Signed)
Damon [name] is a 47 y.o. male be evaluated for psychosis. He continues to have command hallucinations that tell him to hurt himself or hurt someone else. He has been seen by [facility], and [facility], pending, an open bed on the 400 unit.  Flint Melter, MD 01/07/13 1323

## 2013-01-07 NOTE — ED Provider Notes (Signed)
Medical screening examination/treatment/procedure(s) were conducted as a shared visit with non-physician practitioner(s) and myself.  I personally evaluated the patient during the encounter  Flint Melter, MD 01/07/13 [phone]

## 2013-01-07 NOTE — BH Assessment (Signed)
Assessment Note   Damon [name] is an 47 y.o. male presenting with increased depression and command AH to kill himself.  Pt does not endorse a suicide plan or intent but "doenst feel safe".  Pt denies HI and delusions.  Pt continues to endorse paranoia and AH w/ command.  Pt denies hx of SA tx or bx.  Pt calm and cooperative at the times of the assessment, alert and oriented x3.  Pt disheveled and malodorous.  Pt accepted to [facility] 400 hall pending bed availability.       *Previous Note*  Pt is a 47 year old male presenting with depressive symptoms-tearfulness, withdrawal, isolation, decreased grooming. Pt. Report hearing commanding voices instructing him to kill self. Pt. Reports he has no plan or intent but does not feel safe. Pt. Reports he is medication and group compliant. Pt. Reports he had a "nightmare two days ago", and he has not had any sleep since then. Pt. Reports he is fearful and the voices continue to get louder and louder. The pt. Reports "I don't sleep at night because I am afraid, and I am crying because I just want the voices to stop". Pt. Reports last inpatient was a year ago at [facility]. Pt. Reports he spends his time at [facility], the [employer] and out walking. Pt. Lives alone. Pt. Reports he is paranoid and suspicious at the moment and feels that inpatient would assist him in getting stabilized. Pt. Reports symptoms have been worsening in past month and his visits to his therapist has increased. Pt. Reports no other triggers or stressors. Pt. Has no HI, SA/ETOH. Recommendation if for inpatient at [facility]. Pt. Has Axis I DX of Schizoeffective D/O.   Axis I: Schizoaffective Disorder Axis II: Deferred Axis III:  Past Medical History  Diagnosis Date  . Paranoid schizophrenia   . Depression    Axis IV: economic problems, other psychosocial or environmental problems, problems related to social environment and problems with access to health care services Axis V: 21-30 behavior  considerably influenced by delusions or hallucinations OR serious impairment in judgment, communication OR inability to function in almost all areas  Past Medical History:  Past Medical History  Diagnosis Date  . Paranoid schizophrenia   . Depression     History reviewed. No pertinent past surgical history.  Family History: No family history on file.  Social History:  reports that he has been smoking.  His smokeless tobacco use includes Chew. He reports that he drinks about 0.6 ounces of alcohol per week. He reports that he does not use illicit drugs.  Additional Social History:     CIWA: CIWA-Ar BP: 116/76 mmHg Pulse Rate: 75 COWS:    Allergies: No Known Allergies  Home Medications:  (Not in a hospital admission)  OB/GYN Status:  No LMP for male patient.  General Assessment Data Location of Assessment: [place] ED ACT Assessment: Yes Living Arrangements: Alone Can pt return to current living arrangement?: Yes Admission Status: Voluntary Is patient capable of signing voluntary admission?: Yes Transfer from: Home Referral Source: Self/Family/Friend  Education Status Is patient currently in school?: No  Risk to self Suicidal Ideation: Yes-Currently Present Suicidal Intent: Yes-Currently Present Is patient at risk for suicide?: Yes Suicidal Plan?: No Access to Means: Yes Specify Access to Suicidal Means: unpredictable What has been your use of drugs/alcohol within the last 12 months?: none Previous Attempts/Gestures: Yes How many times?: 2 Triggers for Past Attempts: Hallucinations Intentional Self Injurious Behavior: None Family Suicide  History: No Recent stressful life event(s): Turmoil (Comment) Persecutory voices/beliefs?: Yes Depression: Yes Depression Symptoms: Loss of interest in usual pleasures;Tearfulness;Isolating;Feeling worthless/self pity Substance abuse history and/or treatment for substance abuse?: No Suicide prevention information given to  non-admitted patients: Not applicable  Risk to Others Homicidal Ideation: No Thoughts of Harm to Others: No Current Homicidal Intent: No Current Homicidal Plan: No Access to Homicidal Means: No Identified Victim: none History of harm to others?: No Assessment of Violence: None Noted Violent Behavior Description: none noted Does patient have access to weapons?: No Criminal Charges Pending?: No Does patient have a court date: No  Psychosis Hallucinations: Auditory Delusions: Persecutory  Mental Status Report Appear/Hygiene: Disheveled Eye Contact: Fair Motor Activity: Unremarkable Speech: Logical/coherent Level of Consciousness: Alert Mood: Anxious;Helpless Affect: Appropriate to circumstance Anxiety Level: Minimal Thought Processes: Coherent;Relevant Judgement: Unimpaired Orientation: Person;Place;Time;Situation Obsessive Compulsive Thoughts/Behaviors: None  Cognitive Functioning Concentration: Normal Memory: Recent Intact;Remote Intact IQ: Average Insight: Fair Impulse Control: Fair Appetite: Fair Weight Loss: 0 Weight Gain: 0 Sleep: Decreased Total Hours of Sleep: 3 Vegetative Symptoms: Staying in bed;Not bathing;Decreased grooming  ADLScreening [facility] Assessment Services) Patient's cognitive ability adequate to safely complete daily activities?: Yes Patient able to express need for assistance with ADLs?: Yes Independently performs ADLs?: Yes (appropriate for developmental age)  Abuse/Neglect [facility]) Physical Abuse: Denies Verbal Abuse: Denies Sexual Abuse: Denies  Prior Inpatient Therapy Prior Inpatient Therapy: Yes Prior Therapy Dates: 2013 Prior Therapy Facilty/Provider(s): [facility] Reason for Treatment: MH  Prior Outpatient Therapy Prior Outpatient Therapy: Yes Prior Therapy Dates: 2014 Prior Therapy Facilty/Provider(s): [facility] Reason for Treatment: MH  ADL Screening (condition at time of admission) Patient's cognitive ability adequate to safely  complete daily activities?: Yes Patient able to express need for assistance with ADLs?: Yes Independently performs ADLs?: Yes (appropriate for developmental age)       Abuse/Neglect Assessment (Assessment to be complete while patient is alone) Physical Abuse: Denies Verbal Abuse: Denies Sexual Abuse: Denies Values / Beliefs Cultural Requests During Hospitalization: None Spiritual Requests During Hospitalization: None     Nutrition Screen- [facility] Adult/WL/AP Patient's home diet: Regular (Patient requested and received grahm crackers, peanut butter and a coke .)  Additional Information 1:1 In Past 12 Months?: No CIRT Risk: No Elopement Risk: No Does patient have medical clearance?: Yes     Disposition:  Disposition Initial Assessment Completed: Yes Disposition of Patient: Inpatient treatment program Type of inpatient treatment program: Adult  On Site Evaluation by:   Reviewed with Physician:     Danelle Berry 01/07/2013 2:51 PM

## 2013-01-08 ENCOUNTER — Encounter (HOSPITAL_COMMUNITY): Payer: Self-pay | Admitting: *Deleted

## 2013-01-08 ENCOUNTER — Inpatient Hospital Stay (HOSPITAL_COMMUNITY)
Admission: AD | Admit: 2013-01-08 | Discharge: 2013-01-11 | DRG: 885 | Disposition: A | Discharge status: Home / Self Care | Payer: Medicare Other | Source: Intra-hospital | Attending: Psychiatry | Admitting: Psychiatry

## 2013-01-08 DIAGNOSIS — F329 Major depressive disorder, single episode, unspecified: Secondary | ICD-10-CM

## 2013-01-08 DIAGNOSIS — Z79899 Other long term (current) drug therapy: Secondary | ICD-10-CM

## 2013-01-08 DIAGNOSIS — F209 Schizophrenia, unspecified: Secondary | ICD-10-CM

## 2013-01-08 DIAGNOSIS — F251 Schizoaffective disorder, depressive type: Secondary | ICD-10-CM | POA: Diagnosis present

## 2013-01-08 DIAGNOSIS — F200 Paranoid schizophrenia: Principal | ICD-10-CM | POA: Diagnosis present

## 2013-01-08 DIAGNOSIS — F3289 Other specified depressive episodes: Secondary | ICD-10-CM | POA: Diagnosis present

## 2013-01-08 MED ORDER — FLUOXETINE HCL 20 MG PO CAPS
20.0000 mg | ORAL_CAPSULE | Freq: Every day | ORAL | Status: DC
  Administered 2013-01-08 – 2013-01-11 (×4): 20 mg via ORAL
  Filled 2013-01-08 (×6): qty 1

## 2013-01-08 MED ORDER — ACETAMINOPHEN 325 MG PO TABS
650.0000 mg | ORAL_TABLET | Freq: Four times a day (QID) | ORAL | Status: DC | PRN
  Administered 2013-01-08 – 2013-01-09 (×2): 650 mg via ORAL

## 2013-01-08 MED ORDER — ALUM & MAG HYDROXIDE-SIMETH 200-200-20 MG/5ML PO SUSP: 30.0000 mL | ORAL | Status: DC | PRN

## 2013-01-08 MED ORDER — BENZTROPINE MESYLATE 1 MG PO TABS
1.0000 mg | ORAL_TABLET | Freq: Every day | ORAL | Status: DC
  Administered 2013-01-08 – 2013-01-09 (×2): 1 mg via ORAL
  Filled 2013-01-08 (×3): qty 1

## 2013-01-08 MED ORDER — MAGNESIUM HYDROXIDE 400 MG/5ML PO SUSP: 30.0000 mL | Freq: Every day | ORAL | Status: DC | PRN

## 2013-01-08 MED ORDER — MIRTAZAPINE 15 MG PO TABS
15.0000 mg | ORAL_TABLET | Freq: Every evening | ORAL | Status: DC | PRN
  Administered 2013-01-08: 15 mg via ORAL
  Filled 2013-01-08: qty 1

## 2013-01-08 MED ORDER — RISPERIDONE 2 MG PO TABS
2.0000 mg | ORAL_TABLET | Freq: Every day | ORAL | Status: DC
  Administered 2013-01-08: 2 mg via ORAL
  Filled 2013-01-08 (×2): qty 1

## 2013-01-08 NOTE — Tx Team (Signed)
Initial Interdisciplinary Treatment Plan  PATIENT STRENGTHS: (choose at least two) Ability for insight Average or above average intelligence Capable of independent living Communication skills General fund of knowledge Physical Health  PATIENT STRESSORS: Financial difficulties Marital or family conflict Occupational concerns   PROBLEM LIST: Problem List/Patient Goals Date to be addressed Date deferred Reason deferred Estimated date of resolution  Psychosis 01-08-2013   Discharge  Depression 01-08-2013     Anxiety 01-08-2013                                          DISCHARGE CRITERIA:  Improved stabilization in mood, thinking, and/or behavior Motivation to continue treatment in a less acute level of care Verbal commitment to aftercare and medication compliance  PRELIMINARY DISCHARGE PLAN: Outpatient therapy Return to previous living arrangement  PATIENT/FAMIILY INVOLVEMENT: This treatment plan has been presented to and reviewed with the patient, Damon [name].  The patient and family have been given the opportunity to ask questions and make suggestions.  Cranford Mon 01/08/2013, 3:38 PM

## 2013-01-08 NOTE — ED Provider Notes (Signed)
Medical screening examination/treatment/procedure(s) were performed by non-physician practitioner and as supervising physician I was immediately available for consultation/collaboration.  Flint Melter, MD 01/08/13 [id number]

## 2013-01-08 NOTE — Progress Notes (Signed)
Patient ID: Damon [name], male   DOB: Mar 25, 1966, 47 y.o.   MRN: [id number] Patient admitted to [facility] from [facility] due to depressive symptoms and auditory hallucinations.  Patient reports hearing command voices telling him to harm himself.  Patient has no specific plan.  Patient has been compliant with his medications and is followed by [facility].  He reports he had a nightmare recently and has not been sleeping.  Patient lives alone and receives support from [place] [facility] and "a friend".  Recently he quarreled with his friend and he started feeling overwhelmed and alone.  Patient has no substance abuse history.  Patient has been inpt. At [facility] in the past, the last being a year ago.  Patient spends his free time at [facility], [employer] and out walking.  Patient denies any HI at this time.  Patient oriented to room and unit.

## 2013-01-08 NOTE — BH Assessment (Signed)
Assessment Note  Update:  Received call from [facility] from Thurman Coyer, RN, [facility], stating pt accepted by NP Rankin to Dr. Jannifer Franklin to bed 400-2 and that pt could be transported to [facility].  Updated EDP [place] and ED staff.  Completed support paperwork, updated assessment disposition, completed assessment notification, and faxed to [facility] to log.  ED staff to arrange transport to [facility] via security, as pt is voluntary.    Disposition:  Disposition Initial Assessment Completed: Yes Disposition of Patient: Inpatient treatment program Type of inpatient treatment program: Adult Patient referred to: Other (Comment) (Pt accepted [facility])  On Site Evaluation by:   Reviewed with Physician:  Renee Rival 01/08/2013 12:52 PM

## 2013-01-08 NOTE — ED Notes (Signed)
Security at bedside to transport pt. NAD noted.

## 2013-01-08 NOTE — BH Assessment (Signed)
Assessment Note   Damon [name] is an 47 y.o. male that was reassessed this day.  Pt continues to endorse auditory hallucinations instructing him to kill himself.  Pt also admits to increased depression and paranoia.  Pt denies suicidal plan.  Pt also denies HI or SA.  Pt was quiet, pleasant, and alert during assessment.  Pt was calm and cooperative.  Pt disheveled and malodorous.  Pt is accepted at [facility] pending an available bed.  Completed reassessment, assessment notification, and faxed to [facility] to log.  Updated ED staff.  Previous Notes:  Damon [name] is an 47 y.o. male presenting with increased depression and command AH to kill himself. Pt does not endorse a suicide plan or intent but "doenst feel safe". Pt denies HI and delusions. Pt continues to endorse paranoia and AH w/ command. Pt denies hx of SA tx or bx. Pt calm and cooperative at the times of the assessment, alert and oriented x3. Pt disheveled and malodorous. Pt accepted to [facility] 400 hall pending bed availability.  *Previous Note*  Pt is a 47 year old male presenting with depressive symptoms-tearfulness, withdrawal, isolation, decreased grooming. Pt. Report hearing commanding voices instructing him to kill self. Pt. Reports he has no plan or intent but does not feel safe. Pt. Reports he is medication and group compliant. Pt. Reports he had a "nightmare two days ago", and he has not had any sleep since then. Pt. Reports he is fearful and the voices continue to get louder and louder. The pt. Reports "I don't sleep at night because I am afraid, and I am crying because I just want the voices to stop". Pt. Reports last inpatient was a year ago at [facility]. Pt. Reports he spends his time at [facility], the [employer] and out walking. Pt. Lives alone. Pt. Reports he is paranoid and suspicious at the moment and feels that inpatient would assist him in getting stabilized. Pt. Reports symptoms have been worsening in past month and his visits to his  therapist has increased. Pt. Reports no other triggers or stressors. Pt. Has no HI, SA/ETOH. Recommendation if for inpatient at [facility]. Pt. Has Axis I DX of Schizoeffective D/O.   Axis I: 295.70 Schizoaffective Disorder Axis II: Deferred Axis III:  Past Medical History  Diagnosis Date  . Paranoid schizophrenia   . Depression    Axis IV: other psychosocial or environmental problems, problems related to social environment and problems with primary support group Axis V: 21-30 behavior considerably influenced by delusions or hallucinations OR serious impairment in judgment, communication OR inability to function in almost all areas  Past Medical History:  Past Medical History  Diagnosis Date  . Paranoid schizophrenia   . Depression     History reviewed. No pertinent past surgical history.  Family History: No family history on file.  Social History:  reports that he has been smoking.  His smokeless tobacco use includes Chew. He reports that he drinks about 0.6 ounces of alcohol per week. He reports that he does not use illicit drugs.  Additional Social History:  Alcohol / Drug Use Pain Medications: see MAR Prescriptions: see MAR Over the Counter: see MAR History of alcohol / drug use?: No history of alcohol / drug abuse Longest period of sobriety (when/how long): na Negative Consequences of Use:  (na) Withdrawal Symptoms:  (na)  CIWA: CIWA-Ar BP: 117/81 mmHg Pulse Rate: 71 COWS:    Allergies: No Known Allergies  Home Medications:  (Not in a  hospital admission)  OB/GYN Status:  No LMP for male patient.  General Assessment Data Location of Assessment: [facility] ED ACT Assessment: Yes Living Arrangements: Alone Can pt return to current living arrangement?: Yes Admission Status: Voluntary Is patient capable of signing voluntary admission?: Yes Transfer from: Home Referral Source: Self/Family/Friend  Education Status Is patient currently in school?: No  Risk to  self Suicidal Ideation: Yes-Currently Present Suicidal Intent: Yes-Currently Present Is patient at risk for suicide?: Yes Suicidal Plan?: No Access to Means: Yes Specify Access to Suicidal Means: unpredictable What has been your use of drugs/alcohol within the last 12 months?: pt denies Previous Attempts/Gestures: Yes How many times?: 2 Other Self Harm Risks: pt denies Triggers for Past Attempts: Hallucinations Intentional Self Injurious Behavior: None Family Suicide History: No Recent stressful life event(s): Turmoil (Comment) Persecutory voices/beliefs?: Yes Depression: Yes Depression Symptoms: Despondent;Tearfulness;Loss of interest in usual pleasures;Feeling worthless/self pity;Isolating Substance abuse history and/or treatment for substance abuse?: No Suicide prevention information given to non-admitted patients: Not applicable  Risk to Others Homicidal Ideation: No Thoughts of Harm to Others: No Current Homicidal Intent: No Current Homicidal Plan: No Access to Homicidal Means: No Identified Victim: pt denies History of harm to others?: No Assessment of Violence: None Noted Violent Behavior Description: na - pt calm, cooperative Does patient have access to weapons?: No Criminal Charges Pending?: No Does patient have a court date: No  Psychosis Hallucinations: Auditory Delusions: Persecutory  Mental Status Report Appear/Hygiene: Disheveled Eye Contact: Good Motor Activity: Unremarkable Speech: Logical/coherent Level of Consciousness: Quiet/awake Mood: Depressed Affect: Appropriate to circumstance Anxiety Level: Minimal Thought Processes: Coherent;Relevant Judgement: Unimpaired Orientation: Person;Place;Time;Situation Obsessive Compulsive Thoughts/Behaviors: None  Cognitive Functioning Concentration: Normal Memory: Recent Intact;Remote Intact IQ: Average Insight: Fair Impulse Control: Fair Appetite: Fair Weight Loss: 0 Weight Gain: 0 Sleep:  Decreased Total Hours of Sleep: 3 Vegetative Symptoms: Staying in bed;Not bathing;Decreased grooming  ADLScreening [facility] Assessment Services) Patient's cognitive ability adequate to safely complete daily activities?: Yes Patient able to express need for assistance with ADLs?: Yes Independently performs ADLs?: Yes (appropriate for developmental age)  Abuse/Neglect [facility]) Physical Abuse: Denies Verbal Abuse: Denies Sexual Abuse: Denies  Prior Inpatient Therapy Prior Inpatient Therapy: Yes Prior Therapy Dates: 2013 Prior Therapy Facilty/Provider(s): [facility] Reason for Treatment: MH  Prior Outpatient Therapy Prior Outpatient Therapy: Yes Prior Therapy Dates: 2014 Prior Therapy Facilty/Provider(s): [facility] Reason for Treatment: MH  ADL Screening (condition at time of admission) Patient's cognitive ability adequate to safely complete daily activities?: Yes Patient able to express need for assistance with ADLs?: Yes Independently performs ADLs?: Yes (appropriate for developmental age) Weakness of Legs: None Weakness of Arms/Hands: None  Home Assistive Devices/Equipment Home Assistive Devices/Equipment: None    Abuse/Neglect Assessment (Assessment to be complete while patient is alone) Physical Abuse: Denies Verbal Abuse: Denies Sexual Abuse: Denies Exploitation of patient/patient's resources: Denies Self-Neglect: Denies Values / Beliefs Cultural Requests During Hospitalization: None Spiritual Requests During Hospitalization: None Consults Spiritual Care Consult Needed: No Social Work Consult Needed: No [employer]chant navy officer (For Healthcare) Advance Directive: Patient does not have advance directive;Patient would not like information Nutrition Screen- [facility] Adult/WL/AP Patient's home diet: Regular (Patient requested and received grahm crackers, peanut butter and a coke .)  Additional Information 1:1 In Past 12 Months?: No CIRT Risk: No Elopement Risk: No Does patient have medical  clearance?: Yes     Disposition:  Disposition Initial Assessment Completed: Yes Disposition of Patient: Referred to;Inpatient treatment program Type of inpatient treatment program: Adult Patient referred to: Other (Comment) (  Pt accepted to [facility] pending available bed)  On Site Evaluation by:   Reviewed with Physician:     Caryl Comes 01/08/2013 8:53 AM

## 2013-01-08 NOTE — Progress Notes (Signed)
The focus of this group is to help patients review their daily goal of treatment and discuss progress on daily workbooks. Pt attended the evening group and responded to all discussion prompts from the Writer. Pt stated that today was a good day because the voices in his head were starting to decrease, which he attributed to getting his medication. Pt smiled throughout group, appeared engaged in the discussion and smiled when speaking or being spoken to.

## 2013-01-09 ENCOUNTER — Encounter (HOSPITAL_COMMUNITY): Payer: Self-pay | Admitting: Psychiatry

## 2013-01-09 MED ORDER — NICOTINE 21 MG/24HR TD PT24
21.0000 mg | MEDICATED_PATCH | Freq: Every day | TRANSDERMAL | Status: DC
  Administered 2013-01-09 – 2013-01-11 (×3): 21 mg via TRANSDERMAL
  Filled 2013-01-09 (×5): qty 1

## 2013-01-09 MED ORDER — RISPERIDONE 3 MG PO TABS
3.0000 mg | ORAL_TABLET | Freq: Every day | ORAL | Status: DC
  Administered 2013-01-09 – 2013-01-10 (×2): 3 mg via ORAL
  Filled 2013-01-09 (×4): qty 1

## 2013-01-09 MED ORDER — TRAZODONE HCL 100 MG PO TABS
100.0000 mg | ORAL_TABLET | Freq: Every evening | ORAL | Status: DC | PRN
  Administered 2013-01-09 – 2013-01-10 (×2): 100 mg via ORAL
  Filled 2013-01-09 (×2): qty 1

## 2013-01-09 NOTE — Progress Notes (Signed)
D:  Patient up and active in the milieu today.  Has attended all groups and is interacting with his peers.  States he is feeling better at this time.  A:  Medications given as per orders.  Encouraged participation in all groups. Safety maintained on the unit.  R:  Pleasant and cooperative.  Interacting well with staff and peers.  Appetite good.  Patient is safe at this time.
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
## 2013-01-09 NOTE — Progress Notes (Signed)
D: Pt attended evening wrapup group and spent some time socializing w/peers as well- less isolative.  Stated that "the voices are calming down a bit" and feels the medications are helping.  Voiced no complaints.  Eye contact brief with animated facial expression.  Affect and mood anxious/depressed.  Speech is logical and coherent; aside from endorsing lessening AH ("noise that I can't understand"), Pt thought process and content appear grossly organized and unremarkable.  Denies SI/HI, VH, and acute pain.  Contracting for safety on unit.  Interacted with peers and staff appropriately.  A: Provided verbal support and encouragement.  PRNs: Remeron 15mg  PO @2154  with questionable effect; Pt fell asleep at approximately 2330.  All medications administered according to med orders and initial POC.  Q15 minute safety checks maintained as per unit protocol.  R: Pt positive and cooperative.  Safety maintained. Dion Saucier RN

## 2013-01-09 NOTE — Progress Notes (Signed)
Adult Psychoeducational Group Note  Date:  01/09/2013 Time:  0930 Group Topic/Focus:  Recovery Goals:   The focus of this group is to identify appropriate goals for recovery and establish a plan to achieve them.  Participation Level:  Active  Participation Quality:  Appropriate  Affect:  Appropriate  Cognitive:  Oriented  Insight: Good  Engagement in Group:  Engaged  Modes of Intervention:  Education  Additional Comments:  Pt attended group and participated well.  Ilina Xu E 01/09/2013, 3:27 PM

## 2013-01-09 NOTE — Clinical Social Work Note (Signed)
[facility] LCSW Group Therapy  01/09/2013 , 12:10 PM   Type of Therapy:  Group Therapy  Participation Level:  Active  Participation Quality:  Attentive  Affect:  Appropriate  Cognitive:  Alert  Insight:  Improving  Engagement in Therapy:  Engaged  Modes of Intervention:  Discussion, Exploration and Socialization  Summary of Progress/Problems: Today's group focused on the term Diagnosis.  Participants were asked to define the term, and then pronounce whether it is a negative, positive or neutral term.  Elihu talked about diagnose in terms of "finding out hwat is wrong with you, and what you can do about it."  His experience is that sharing diagnose with others is a negative, because "they react badly, they can't relate, and they say negative things."  This causes mistrust and isolation.    Daryel Gerald B 01/09/2013 , 12:10 PM

## 2013-01-09 NOTE — Progress Notes (Signed)
Patient ID: Damon [name], male   DOB: 1966-09-17, 47 y.o.   MRN: [id number]  D:  Pt endorses passive SI, but contracts for safety.Pt denies HI/AVH. Pt is pleasant and cooperative. Pt states he is ready for discharge. Pt states he wants to focus on not getting so depressed when he is on the outside, which causes him to end up here. Pt states it's the other people in his life that don't take his illness seriously or don't want to understand what's going on with him. Pt says that is what gets him depressed and upset also.    A: Pt was offered support and encouragement. Pt was given scheduled medications. Pt was encourage to attend groups. Q 15 minute checks were done for safety. Pt given tylenol for H/A 8 out of 10 at 2043.   R:Pt attends groups and interacts well with peers and staff. Pt is taking medication. Pt has no complaints at this time.Pt receptive to treatment and safety maintained on unit.

## 2013-01-09 NOTE — Progress Notes (Signed)
The focus of this group is to help patients review their daily goal of treatment and discuss progress on daily workbooks. Pt attended the evening group session and responded to all discussion prompts from the Writer. Pt stated that he had several long term goals. "One of my goals is to not have to return here, no offense." Pt also stated that he would like to be well enough in the future to focus on things that made him happy. Pt smiled throughout group and appeared engaged.

## 2013-01-09 NOTE — Progress Notes (Signed)
Adult Psychosocial Assessment Update Interdisciplinary Team  Previous [facility] admissions/discharges:  Admissions Discharges  Date:10/07/2012 Date:  Date: Date:  Date: Date:  Date: Date:  Date: Date:   Changes since the last Psychosocial Assessment (including adherence to outpatient mental health and/or substance abuse treatment, situational issues contributing to decompensation and/or relapse). Damon [name] states he has been taking his meds as prescribed and going to his outpt appointments.  He states he became symptomatic due to a break up in a 10 year relationship, thought it it is difficult to determine if he became symptomatic first, which may have led to the break up.             Discharge Plan 1. Will you be returning to the same living situation after discharge?   Yes:X No:      If no, what is your plan?           2. Would you like a referral for services when you are discharged? Yes:     If yes, for what services?  No:  X  Already open at [facility]            Summary and Recommendations (to be completed by the evaluator) Crises stabilization, medication management and therapeutic milieu will all be provided.                       Signature:  Ida Rogue 01/09/2013 3:46 PM

## 2013-01-09 NOTE — Progress Notes (Signed)
[facility] LCSW Aftercare Discharge Planning Group Note  01/09/2013 9:21 AM  Participation Quality:  Attentive  Affect:  Flat  Cognitive:  Oriented  Insight:  Improving  Engagement in Group:  Improving  Modes of Intervention:  Discussion, Exploration and Socialization  Summary of Progress/Problems: Damon [name] states he is here due to break up of 10 year relationship.  Hearing voices, thoughts of self harm.  Says he has an appointment with housing on Thursday-wonders if he will be able to go to that.  I told him to call and reschedule.  Damon [name] 01/09/2013, 9:21 AM

## 2013-01-09 NOTE — [facility] Suicide Risk Assessment (Signed)
Suicide Risk Assessment  Admission Assessment     Nursing information obtained from:    Demographic factors:    Current Mental Status:    Loss Factors:    Historical Factors:    Risk Reduction Factors:     CLINICAL FACTORS:   Depression:   Hopelessness Impulsivity Insomnia Schizophrenia:   Paranoid or undifferentiated type  COGNITIVE FEATURES THAT CONTRIBUTE TO RISK:  Closed-mindedness Polarized thinking    SUICIDE RISK:   Moderate:  Frequent suicidal ideation with limited intensity, and duration, some specificity in terms of plans, no associated intent, good self-control, limited dysphoria/symptomatology, some risk factors present, and identifiable protective factors, including available and accessible social support.  PLAN OF CARE:1. Admit for crisis management and stabilization. 2. Medication management to reduce current symptoms to base line and improve the  patient's overall level of functioning 3. Treat health problems as indicated. 4. Develop treatment plan to decrease risk of relapse upon discharge and the need for  readmission. 5. Psycho-social education regarding relapse prevention and self care. 6. Health care follow up as needed for medical problems. 7. Restart home medications where appropriate.   I certify that inpatient services furnished can reasonably be expected to improve the patient's condition.  Thedore Mins, MD 01/09/2013, 10:26 AM

## 2013-01-09 NOTE — Treatment Plan (Signed)
  Interdisciplinary Treatment Plan Update   Date Reviewed:  01/09/2013  Time Reviewed:  8:36 AM  Progress in Treatment:   Attending groups: Yes Participating in groups: Yes Taking medication as prescribed: Yes  Tolerating medication: Yes Family/Significant other contact made: No Patient understands diagnosis: Yes As evidenced by asking for help with psychosis, depression Discussing patient identified problems/goals with staff: Yes  See intiial plan Medical problems stabilized or resolved: Yes Denies suicidal/homicidal ideation: No  States it is "on and off" Patient has not harmed self or others: Yes  For review of initial/current patient goals, please see plan of care.  Estimated Length of Stay:  3-5 days  Reason for Continuation of Hospitalization: Depression Hallucinations Medication stabilization Suicidal ideation  New Problems/Goals identified:  N/A  Discharge Plan or Barriers:   return home, follow up [facility]  Additional Comments:  Attendees:  Signature: Thedore Mins, MD 01/09/2013 8:36 AM   Signature: Richelle Ito, LCSW 01/09/2013 8:36 AM  Signature: Verne Spurr, PA 01/09/2013 8:36 AM  Signature: Izola Price, RN 01/09/2013 8:36 AM  Signature: Liborio Nixon, RN 01/09/2013 8:36 AM  Signature:  01/09/2013 8:36 AM  Signature:   01/09/2013 8:36 AM  Signature:    Signature:    Signature:    Signature:    Signature:    Signature:      Scribe for Treatment Team:   Richelle Ito, LCSW  01/09/2013 8:36 AM

## 2013-01-10 DIAGNOSIS — F200 Paranoid schizophrenia: Principal | ICD-10-CM

## 2013-01-10 MED ORDER — BENZTROPINE MESYLATE 1 MG PO TABS
1.0000 mg | ORAL_TABLET | Freq: Two times a day (BID) | ORAL | Status: DC
  Administered 2013-01-10 – 2013-01-11 (×2): 1 mg via ORAL
  Filled 2013-01-10 (×4): qty 1

## 2013-01-10 NOTE — Progress Notes (Signed)
The focus of this group is to help patients review their daily goal of treatment and discuss progress on daily workbooks. Pt attended the evening group session and participated in the discussion. Pt reported that he felt his medications were working well for him. Pt also reported that one of his highlights today was a group presentation from the [facility], which he found informative. Pt smiled throughout group and appeared engaged.

## 2013-01-10 NOTE — Progress Notes (Signed)
Recreation Therapy Notes   Date: 02.26.2014 Time: 9:30am Location: [address] Day Room      Group Topic/Focus: Decision Making  Participation Level: Active  Participation Quality: Appropriate  Affect: Appropriate  Cognitive: Appropriate   Additional Comments: Patient with peers played adapted version of "Chocies in a Jar" Patients were given a small styrofoam football with "ASK" written on one side and "ANSWER" written on the other. Patients were asked to throw the ball to a peer and decide if they wanted to ask a question or answer a question. If patient chose to ask a question they would pick a peer and select a question for the "Choice in a Jar" if patient chose to answer a question LRT would select question card from "Choices in a Jar" and ask the patient the question.   Patient participated in group activity. Patient chose to answer questions. Patient interacted appropriately with peers. Patient appeared to be sleeping periodically throughout group, but would "wake up" and immediately engaged in conversation. When asked "If you had to choose: NEver have anyone notice you or always have people hover around you." Patient chose have people hoover around you. Patient stated this was because he feels support when people are around. Patient engaged in wrap up discussion about making good decisions. Patient stated he sometimes becomes confused when he has to chose between taking a shower or doing the dishes. LRT encouraged to find someone in his support system that can help him stay on track. LRT additionally suggested making a list to keep patient on track.  Marykay Lex Silas Sedam, LRT/CTRS    Jearl Klinefelter 01/10/2013 12:03 PM

## 2013-01-10 NOTE — Progress Notes (Addendum)
D: Patient having passive SI but verbally contracts for safety. Patient is also positive for auditory hallucinations and states that he can only hear "mumbling now." Patient denies HI and visual hallucinations. The patient has a depressed mood and affect. The patient rates his depression a 6 out of 10 and his hopelessness a 4 out of 10 (1 low/10 high). The patient reports sleeping fairly well and states that his appetite is improving but that his energy level is low. The patient is isolating within the milieu and prefers to stay in bed.   A: Patient given emotional support from RN. Patient encouraged to come to staff with concerns and/or questions. Patient's medication routine continued. Patient's orders and plan of care reviewed.  R: Patient remains appropriate and cooperative. Will continue to monitor patient q15 minutes for safety.

## 2013-01-10 NOTE — Clinical Social Work Note (Cosign Needed)
[facility] Group Notes:  (Counselor/Nursing/MHT/Case Management/Adjunct)  09/29/2012 12:00 PM  Type of Therapy:  Group Therapy  Participation Level:  Active  Participation Quality:  Appropriate  Affect:  Appropriate  Cognitive:  Appropriate  Insight:  Engaged  Engagement in Group:  Engaged  Engagement in Therapy:  Engaged  Modes of Intervention:  Discussion, Socialization and Support  Summary of Progress/Problems: MHA: Patient was attentive and engaged with speaker from [employer]. Mallory was engaged and listened intently to speaker's personal story and his description of [facility] services. He expressed gratitude to the speaker for playing songs on the guitar and asked questions about the types of songs he plays and about the guitar.    Gerald Kuehl N 09/29/2012, 12:00 PM

## 2013-01-10 NOTE — Progress Notes (Addendum)
[facility] MD Progress Note  01/10/2013 1:46 PM Damon [name]  MRN:  [id number] Subjective:  "I'm doing fair."  Objective: Damon [name] states he is doing fair, reports no new symptoms. States he has a headache and has been taking Tylenol for it, also some dizziness and blurred vision. Diagnosis:  Schizophrenia, paranoid type  ADL's:  Intact  Sleep: Poor  Appetite:  Fair  Suicidal Ideation:  "On and off" Homicidal Ideation:  denies AEB (as evidenced by):patient's response, report of improved symptoms, affect and participation in unit programming.  Psychiatric Specialty Exam: Review of Systems  Constitutional: Negative.  Negative for fever, chills, weight loss, malaise/fatigue and diaphoresis.  HENT: Negative for congestion and sore throat.   Eyes: Positive for blurred vision. Negative for double vision and photophobia.  Respiratory: Negative for cough, shortness of breath and wheezing.   Cardiovascular: Negative for chest pain, palpitations and PND.  Gastrointestinal: Negative for heartburn, nausea, vomiting, abdominal pain, diarrhea and constipation.  Musculoskeletal: Negative for myalgias, joint pain and falls.  Neurological: Positive for dizziness (on standing) and headaches. Negative for tingling, tremors, sensory change, speech change, focal weakness, seizures, loss of consciousness and weakness.  Endo/Heme/Allergies: Negative for polydipsia. Does not bruise/bleed easily.  Psychiatric/Behavioral: Negative for depression, suicidal ideas, hallucinations, memory loss and substance abuse. The patient is not nervous/anxious and does not have insomnia.     Blood pressure 131/94, pulse 118, temperature 98.1 F (36.7 C), temperature source Oral, resp. rate 16, height 5\' 11"  (1.803 m), weight 72.576 kg (160 lb).Body mass index is 22.33 kg/(m^2).  General Appearance: Disheveled  Eye Contact::  Good  Speech:  Normal Rate  Volume:  Normal  Mood:  Anxious and Depressed  Affect:  Congruent   Thought Process:  Disorganized  Orientation:  Other:  2/3  Thought Content:  Hallucinations: Auditory  Suicidal Thoughts:  Yes.  without intent/plan  Homicidal Thoughts:  No  Memory:  Immediate;   Fair  Judgement:  Impaired  Insight:  Lacking  Psychomotor Activity:  Normal  Concentration:  Fair  Recall:  Fair  Akathisia:  No  Handed:  Right  AIMS (if indicated):     Assets:  Communication Skills Desire for Improvement Housing Physical Health Resilience  Sleep:  Number of Hours: 5.75   Current Medications: Current Facility-Administered Medications  Medication Dose Route Frequency Provider Last Rate Last Dose  . acetaminophen (TYLENOL) tablet 650 mg  650 mg Oral Q6H PRN Verne Spurr, PA-C   650 mg at 01/09/13 2043  . alum & mag hydroxide-simeth (MAALOX/MYLANTA) 200-200-20 MG/5ML suspension 30 mL  30 mL Oral Q4H PRN Verne Spurr, PA-C      . benztropine (COGENTIN) tablet 1 mg  1 mg Oral QHS Verne Spurr, PA-C   1 mg at 01/09/13 2328  . FLUoxetine (PROZAC) capsule 20 mg  20 mg Oral Daily Verne Spurr, PA-C   20 mg at 01/10/13 0759  . magnesium hydroxide (MILK OF MAGNESIA) suspension 30 mL  30 mL Oral Daily PRN Verne Spurr, PA-C      . nicotine (NICODERM CQ - dosed in mg/24 hours) patch 21 mg  21 mg Transdermal Daily Mojeed Akintayo   21 mg at 01/10/13 0657  . risperiDONE (RISPERDAL) tablet 3 mg  3 mg Oral QHS Mojeed Akintayo   3 mg at 01/09/13 2328  . traZODone (DESYREL) tablet 100 mg  100 mg Oral QHS PRN Mojeed Akintayo   100 mg at 01/09/13 2335    Lab Results: No results found for this or  any previous visit (from the past 48 hour(s)).  Physical Findings: AIMS: Facial and Oral Movements Muscles of Facial Expression: None, normal Lips and Perioral Area: None, normal Jaw: None, normal Tongue: None, normal,Extremity Movements Upper (arms, wrists, hands, fingers): None, normal Lower (legs, knees, ankles, toes): None, normal, Trunk Movements Neck, shoulders, hips: None,  normal, Overall Severity Severity of abnormal movements (highest score from questions above): None, normal Incapacitation due to abnormal movements: None, normal Patient's awareness of abnormal movements (rate only patient's report): No Awareness, Dental Status Current problems with teeth and/or dentures?: No Does patient usually wear dentures?: No  CIWA:    COWS:     Treatment Plan Summary: Daily contact with patient to assess and evaluate symptoms and progress in treatment Medication management  Plan: 1. Continue for crisis management and stabilization. 2. Medication management to reduce current symptoms to base line and improve the patient's overall level of functioning 3. Treat health problems as indicated. 4. Develop treatment plan to decrease risk of relapse upon discharge and the need for readmission. 5. Psycho-social education regarding relapse prevention and self care. 6. Health care follow up as needed for medical problems. 7. Restart home medications where appropriate. 8. Will evaluate vitals over previous 72 hours to assess BP. Physical symptoms are likely related to his risperdal. 9. Will increase the cogentin to 1mg  po BID. Medical Decision Making Problem Points:  Established problem, stable/improving (1) Data Points:  Order Aims Assessment (2) Review or order medicine tests (1) Review and summation of old records (2)  I certify that inpatient services furnished can reasonably be expected to improve the patient's condition.  Rona Ravens. Ercole Georg RPAC 1:55 PM 01/10/2013

## 2013-01-10 NOTE — Progress Notes (Signed)
D: Patient in bed on approach.  Patient states his day was ok.  Patient states he had an ok day but states he battled headaches today.  Patient states he is no longer having headaches at this time.    Patient states he denies SI/HI and states he is still having auditory hallucinations.  Patient states the hallucinations are decreasing.   A: Staff to monitor Q 15 mins for safety.  Encouragement and support offered.  Scheduled medications administered.  R: Patient remains safe on the unit.  Patient attended group tonight.  Patient calm, cooperative and taking administered medications.

## 2013-01-10 NOTE — Progress Notes (Signed)
[facility] LCSW Aftercare Discharge Planning Group Note  01/10/2013 9:24 AM  Participation Quality:  Attentive  Affect:  Appropriate  Cognitive:  Oriented  Insight:  Improving  Engagement in Group:  Improving  Modes of Intervention:  Discussion, Exploration and Socialization  Summary of Progress/Problems:  Nivin reports no new problems today.  Says he called housing about his appointment yesterday.  I encouraged him to call his father today.    Daryel Gerald B 01/10/2013, 9:24 AM

## 2013-01-11 MED ORDER — FLUOXETINE HCL 20 MG PO CAPS: 20.0000 mg | ORAL_CAPSULE | Freq: Every day | ORAL | Status: DC

## 2013-01-11 MED ORDER — RISPERIDONE 3 MG PO TABS: 3.0000 mg | ORAL_TABLET | Freq: Every day | ORAL | Status: DC

## 2013-01-11 MED ORDER — BENZTROPINE MESYLATE 1 MG PO TABS: 1.0000 mg | ORAL_TABLET | Freq: Two times a day (BID) | ORAL | Status: DC

## 2013-01-11 NOTE — [facility] Suicide Risk Assessment (Signed)
Suicide Risk Assessment  Discharge Assessment     Demographic Factors:  Male, Low socioeconomic status and Unemployed  Mental Status Per Nursing Assessment::   On Admission:     Current Mental Status by Physician: patient denies suicidal ideation,intent or plan  Loss Factors: Decrease in vocational status and Financial problems/change in socioeconomic status  Historical Factors: NA  Risk Reduction Factors:   Positive social support, Positive therapeutic relationship and Positive coping skills or problem solving skills  Continued Clinical Symptoms:  Resolving psychosis  Cognitive Features That Contribute To Risk:  Closed-mindedness Polarized thinking    Suicide Risk:  Minimal: No identifiable suicidal ideation.  Patients presenting with no risk factors but with morbid ruminations; may be classified as minimal risk based on the severity of the depressive symptoms  Discharge Diagnoses:   AXIS I:  Schizophrenia, paranoid type  AXIS II:  Deferred AXIS III:   Past Medical History  Diagnosis Date  . Paranoid schizophrenia   . Depression    AXIS IV:  other psychosocial or environmental problems and problems related to social environment AXIS V:  61-70 mild symptoms  Plan Of Care/Follow-up recommendations:  Activity:  as tolerated Diet:  healthy Tests:  routine blood test Other:  patient to keep his after care appointment with [facility]  Is patient on multiple antipsychotic therapies at discharge:  No   Has Patient had three or more failed trials of antipsychotic monotherapy by history:  No  Recommended Plan for Multiple Antipsychotic Therapies: N/A  Maryori Weide,MD 01/11/2013, 10:33 AM

## 2013-01-11 NOTE — Care Management Utilization Note (Signed)
   Per State Regulation 482.30  This chart was reviewed for necessity with respect to the patient's Admission/ Duration of stay.  Next review date: 01/15/13  Troyce Febo Morrison RN, BSN 

## 2013-01-11 NOTE — Progress Notes (Signed)
[facility] INPATIENT:  Family/Significant Other Suicide Prevention Education  Suicide Prevention Education:  Contact Attempts: Anthonio Mizzell, father, [phone], has been identified by the patient as the family member/significant other with whom the patient will be residing, and identified as the person(s) who will aid the patient in the event of a mental health crisis.  With written consent from the patient, two attempts were made to provide suicide prevention education, prior to and/or following the patient's discharge.  We were unsuccessful in providing suicide prevention education.  A suicide education pamphlet was given to the patient to share with family/significant other.  Date and time of first attempt:01/10/13  1:15PM Date and time of second attempt:01/11/13  10:15AM  Ida Rogue 01/11/2013, 10:12 AM

## 2013-01-11 NOTE — Progress Notes (Signed)
[facility] Adult Case Management Discharge Plan :  Will you be returning to the same living situation after discharge: Yes,  home At discharge, do you have transportation home?:Yes,  given bus pass Do you have the ability to pay for your medications:Yes,  MCD, mental health  Release of information consent forms completed and in the chart;  Patient's signature needed at discharge.  Patient to Follow up at: Follow-up Information   Follow up with [facility]. (Go to the walk-in clinic M_F between 8 and 9AM for your hospital follow up appointment, or go to the appointment that you already have scheduled if it is within the next 2 weeks)    Contact information:   [address]  [place]  [phone]      Patient denies SI/HI:   Yes,  yes    Safety Planning and Suicide Prevention discussed:  Yes,  yes  Damon [name] B 01/11/2013, 10:15 AM

## 2013-01-11 NOTE — Progress Notes (Signed)
[facility] LCSW Aftercare Discharge Planning Group Note  01/11/2013 9:24 AM  Participation Quality:  Appropriate  Affect:  Appropriate  Cognitive:  Appropriate  Insight:  Improving  Engagement in Group:  Engaged  Modes of Intervention:  Clarification, Discussion, Problem-solving, Socialization and Support  Summary of Progress/Problems: Patient reported that he is "doing fine" this morning and he has not heard voices today. He has no thoughts of self-harm and is ready to discharge. He reports headaches due to his arm patch. Damon [name] will need a bus pass when he discharges. Appt will be confirmed at [facility] by SW.   Smart, Damon [name] 01/11/2013, 9:24 AM

## 2013-01-11 NOTE — Discharge Summary (Signed)
Physician Discharge Summary Note  Patient:  Damon [name] is an 47 y.o., male MRN:  [id number] DOB:  11-16-1965 Patient phone:  [phone] (home)  Patient address:   [address] [place] [address],   Date of Admission:  01/08/2013 Date of Discharge: 01/11/2013  Reason for Admission:  Auditory Hallucinations  Discharge Diagnoses: Principal Problem:   Schizophrenia, paranoid type Active Problems:   Depression Discharge Diagnoses:  AXIS I: Schizophrenia, paranoid type  AXIS II: Deferred  AXIS III:  Past Medical History   Diagnosis  Date   .  Paranoid schizophrenia    .  Depression     AXIS IV: other psychosocial or environmental problems and problems related to social environment  AXIS V: 61-70 mild symptoms Review of Systems  Constitutional: Negative.  Negative for fever, chills, weight loss, malaise/fatigue and diaphoresis.  HENT: Negative for congestion and sore throat.   Eyes: Negative for blurred vision, double vision and photophobia.  Respiratory: Negative for cough, shortness of breath and wheezing.   Cardiovascular: Negative for chest pain, palpitations and PND.  Gastrointestinal: Negative for heartburn, nausea, vomiting, abdominal pain, diarrhea and constipation.  Musculoskeletal: Negative for myalgias, joint pain and falls.  Neurological: Negative for dizziness, tingling, tremors, sensory change, speech change, focal weakness, seizures, loss of consciousness, weakness and headaches.  Endo/Heme/Allergies: Negative for polydipsia. Does not bruise/bleed easily.  Psychiatric/Behavioral: Negative for depression, suicidal ideas, hallucinations, memory loss and substance abuse. The patient is not nervous/anxious and does not have insomnia.    Level of Care:  OP  Hospital Course:  Patient was admitted for auditory hallucinations.  He stated that his stressor was an argument with his girl friend.  He is followed at [facility] where he gets his medication for paranoid  schizophrenia.  After being given medical clearance in the ED he was transferred to [facility] for further stabilization and crisis management.      Damon [name] was started on Risperdal but titrated to 3mg  at hs for his psychosis, he was continued on the Prozac 20mg  capsules for depression, Cogentin 1mg  was increased to BID due to his reports of blurred vision and dizziness with the increase in Risperdal.  For insomnia he was given Trazodone 100mg  for sleep. His symptoms improved rapidly and he reported a decrease in symptoms by the 2nd day on the unit.  Damon [name] noted improved sleep, improved appetite, positive mood, and denied AVH . On the day of discharge Damon [name] noted that he had no SI/HI, AVH had resolved and he was alert and oriented x 3 and in full contact with reality.      Damon [name] no longer met the criteria for continued in patient admission and was in much improved condition than upon admission.  Consults:  None  Significant Diagnostic Studies:  None  Discharge Vitals:   Blood pressure 119/84, pulse 111, temperature 98 F (36.7 C), temperature source Oral, resp. rate 14, height 5\' 11"  (1.803 m), weight 72.576 kg (160 lb). Body mass index is 22.33 kg/(m^2). Lab Results:   No results found for this or any previous visit (from the past 72 hour(s)).  Physical Findings: AIMS: Facial and Oral Movements Muscles of Facial Expression: None, normal Lips and Perioral Area: None, normal Jaw: None, normal Tongue: None, normal,Extremity Movements Upper (arms, wrists, hands, fingers): None, normal Lower (legs, knees, ankles, toes): None, normal, Trunk Movements Neck, shoulders, hips: None, normal, Overall Severity Severity of abnormal movements (highest score from questions above): None, normal Incapacitation due to abnormal movements: None, normal  Patient's awareness of abnormal movements (rate only patient's report): No Awareness, Dental Status Current problems with teeth and/or dentures?: No Does patient  usually wear dentures?: No  CIWA:    COWS:     Psychiatric Specialty Exam: See Psychiatric Specialty Exam and Suicide Risk Assessment completed by Attending Physician prior to discharge.  Discharge destination:  Home  Is patient on multiple antipsychotic therapies at discharge:  No   Has Patient had three or more failed trials of antipsychotic monotherapy by history:  No  Recommended Plan for Multiple Antipsychotic Therapies: Not applicable   Discharge Orders   Future Orders Complete By Expires     Diet - low sodium heart healthy  As directed     Discharge instructions  As directed     Comments:      Take all your medications as prescribed by your mental healthcare provider. Report any adverse effects and or reactions from your medicines to your outpatient provider promptly. Patient is instructed and cautioned to not engage in alcohol and or illegal drug use while on prescription medicines. In the event of worsening symptoms, patient is instructed to call the crisis hotline, 911 and or go to the nearest ED for appropriate evaluation and treatment of symptoms. Follow-up with your primary care provider for your other medical issues, concerns and or health care needs.    Increase activity slowly  As directed         Medication List    STOP taking these medications       mirtazapine 15 MG tablet  Commonly known as:  REMERON      TAKE these medications     Indication   benztropine 1 MG tablet  Commonly known as:  COGENTIN  Take 1 tablet (1 mg total) by mouth 2 (two) times daily. For EPS   Indication:  Extrapyramidal Reaction caused by Medications     FLUoxetine 20 MG capsule  Commonly known as:  PROZAC  Take 1 capsule (20 mg total) by mouth daily. For depression.   Indication:  Depression     risperiDONE 3 MG tablet  Commonly known as:  RISPERDAL  Take 1 tablet (3 mg total) by mouth at bedtime. For psychosis.   Indication:  Schizophrenia           Follow-up  Information   Follow up with [facility]. (Go to the walk-in clinic M_F between 8 and 9AM for your hospital follow up appointment, or go to the appointment that you already have scheduled if it is within the next 2 weeks)    Contact information:   [address]  [place]  [phone]] [phone]      Follow-up recommendations:  As noted above  Comments:  Total Discharge Time:  Greater than 30 minutes.  Signed: Rona Ravens. Demetrie Borge RPAC 10:59 AM 01/11/2013

## 2013-01-11 NOTE — Progress Notes (Signed)
Pt discharged per MD orders; pt currently denies SI/HI and auditory/visual hallucinations; pt was given education by RN regarding follow-up appointments and medications and pt denied any questions or concerns about these instructions; pt was then escorted to search room to retrieve his belongings by RN before being discharged to hospital lobby. 

## 2013-01-15 NOTE — Discharge Summary (Signed)
Seen and agreed. Akasha Melena, MD 

## 2013-01-16 NOTE — Progress Notes (Signed)
Patient Discharge Instructions:  After Visit Summary (AVS):   Faxed to:  01/16/13 Discharge Summary Note:   Faxed to:  01/16/13 Psychiatric Admission Assessment Note:   Faxed to:  01/16/13 Suicide Risk Assessment - Discharge Assessment:   Faxed to:  01/16/13 Faxed/Sent to the Next Level Care provider:  01/16/13 Faxed to [facility] @ [phone]  Jerelene Redden, 01/16/2013, 4:16 PM

## 2013-04-25 ENCOUNTER — Ambulatory Visit: Payer: Self-pay

## 2013-09-20 ENCOUNTER — Other Ambulatory Visit: Payer: Self-pay

## 2014-01-22 DIAGNOSIS — F259 Schizoaffective disorder, unspecified: Secondary | ICD-10-CM | POA: Diagnosis not present

## 2014-05-21 ENCOUNTER — Emergency Department (INDEPENDENT_AMBULATORY_CARE_PROVIDER_SITE_OTHER)
Admission: EM | Admit: 2014-05-21 | Discharge: 2014-05-21 | Disposition: A | Discharge status: Home / Self Care | Payer: Medicare Other | Source: Home / Self Care

## 2014-05-21 ENCOUNTER — Emergency Department (INDEPENDENT_AMBULATORY_CARE_PROVIDER_SITE_OTHER): Payer: Medicare Other

## 2014-05-21 ENCOUNTER — Encounter (HOSPITAL_COMMUNITY): Payer: Self-pay | Admitting: Emergency Medicine

## 2014-05-21 DIAGNOSIS — R071 Chest pain on breathing: Secondary | ICD-10-CM

## 2014-05-21 DIAGNOSIS — W11XXXA Fall on and from ladder, initial encounter: Secondary | ICD-10-CM

## 2014-05-21 DIAGNOSIS — S20219A Contusion of unspecified front wall of thorax, initial encounter: Secondary | ICD-10-CM | POA: Diagnosis not present

## 2014-05-21 DIAGNOSIS — R0789 Other chest pain: Secondary | ICD-10-CM

## 2014-05-21 DIAGNOSIS — S20212A Contusion of left front wall of thorax, initial encounter: Secondary | ICD-10-CM

## 2014-05-21 DIAGNOSIS — R079 Chest pain, unspecified: Secondary | ICD-10-CM | POA: Diagnosis not present

## 2014-05-21 DIAGNOSIS — S298XXA Other specified injuries of thorax, initial encounter: Secondary | ICD-10-CM | POA: Diagnosis not present

## 2014-05-21 LAB — POCT URINALYSIS DIP (DEVICE)
BILIRUBIN URINE: NEGATIVE
GLUCOSE, UA: NEGATIVE mg/dL
Ketones, ur: NEGATIVE mg/dL
Leukocytes, UA: NEGATIVE
NITRITE: NEGATIVE
Protein, ur: NEGATIVE mg/dL
Specific Gravity, Urine: >=1.03 (ref 1.005–1.030)
Urobilinogen, UA: 0.2 mg/dL (ref 0.0–1.0)
pH: 5.5 (ref 5.0–8.0)

## 2014-05-21 MED ORDER — TRAMADOL HCL 50 MG PO TABS: 50.0000 mg | ORAL_TABLET | Freq: Four times a day (QID) | ORAL | Status: DC | PRN

## 2014-05-21 MED ORDER — DICLOFENAC POTASSIUM 50 MG PO TABS: 50.0000 mg | ORAL_TABLET | Freq: Three times a day (TID) | ORAL | Status: DC

## 2014-05-21 NOTE — ED Provider Notes (Signed)
CSN: [id number]     Arrival date & time 05/21/14  1324 History   First MD Initiated Contact with Patient 05/21/14 1353     Chief Complaint  Patient presents with  . Flank Pain   (Consider location/radiation/quality/duration/timing/severity/associated sxs/prior Treatment) HPI Comments: Fell approx 3 feet alittle over a week ago onto his L chest/abd. C/O persistent pain L lower ribs and costal margin from posterior axillary line to mid clavicular line. No SH of breath. Pain worse with lying on L side.   Past Medical History  Diagnosis Date  . Paranoid schizophrenia   . Depression    History reviewed. No pertinent past surgical history. History reviewed. No pertinent family history. History  Substance Use Topics  . Smoking status: Current Every Day Smoker -- 0.50 packs/day  . Smokeless tobacco: Current User    Types: Chew  . Alcohol Use: 0.6 oz/week    1 Cans of beer per week     Comment: one can of beer weekly    Review of Systems  Constitutional: Negative.   Respiratory: Negative.  Negative for cough, chest tightness, shortness of breath and wheezing.   Gastrointestinal: Negative.   Genitourinary: Negative.   Musculoskeletal:       As per HPI  Skin: Negative.  Negative for wound.  Neurological: Negative for dizziness, weakness and headaches.    Allergies  Review of patient's allergies indicates no known allergies.  Home Medications   Prior to Admission medications   Medication Sig Start Date End Date Taking? Authorizing Provider  benztropine (COGENTIN) 1 MG tablet Take 1 tablet (1 mg total) by mouth 2 (two) times daily. For EPS 01/11/13   Verne SpurrNeil Mashburn, PA-C  diclofenac (CATAFLAM) 50 MG tablet Take 1 tablet (50 mg total) by mouth 3 (three) times daily. One tablet TID with food prn pain. 05/21/14   Hayden Rasmussenavid Tonny Isensee, NP  FLUoxetine (PROZAC) 20 MG capsule Take 1 capsule (20 mg total) by mouth daily. For depression. 01/11/13   Verne SpurrNeil Mashburn, PA-C  risperiDONE (RISPERDAL) 3 MG tablet  Take 1 tablet (3 mg total) by mouth at bedtime. For psychosis. 01/11/13   Verne SpurrNeil Mashburn, PA-C  traMADol (ULTRAM) 50 MG tablet Take 1 tablet (50 mg total) by mouth every 6 (six) hours as needed. 05/21/14   Hayden Rasmussenavid Oris Calmes, NP   BP 127/87  Pulse 84  Temp(Src) 98.3 F (36.8 C) (Oral)  Resp 16  SpO2 98% Physical Exam  Nursing note and vitals reviewed. Constitutional: He is oriented to person, place, and time. He appears well-developed and well-nourished.  HENT:  Head: Normocephalic and atraumatic.  Eyes: EOM are normal. Left eye exhibits no discharge.  Neck: Normal range of motion. Neck supple.  Cardiovascular: Normal rate, regular rhythm and normal heart sounds.   Pulmonary/Chest: Effort normal and breath sounds normal. No respiratory distress. He has no wheezes. He has no rales.  Tenderness along the lower L ribs from post axillary line to anteror MCL, including costal margin.   Abdominal:  Tenderness L lateral abdominal wall between costal margin and iliac crest.  Musculoskeletal: He exhibits no edema.  Neurological: He is alert and oriented to person, place, and time. No cranial nerve deficit.  Skin: Skin is warm and dry.  Psychiatric: He has a normal mood and affect.    ED Course  Procedures (including critical care time) Labs Review Labs Reviewed  POCT URINALYSIS DIP (DEVICE) - Abnormal; Notable for the following:    Hgb urine dipstick TRACE (*)    All other  components within normal limits    Imaging Review Dg Ribs Unilateral W/chest Left  05/21/2014   CLINICAL DATA:  Fall from ladder 2 weeks ago. Left Back pain and posterior rib pain.  EXAM: LEFT RIBS AND CHEST - 3+ VIEW  COMPARISON:  Chest x-ray 01/06/2013  FINDINGS: No fracture or other bone lesions are seen involving the ribs. There is no evidence of pneumothorax or pleural effusion. Both lungs are clear. Heart size and mediastinal contours are within normal limits.  IMPRESSION: Negative.   Electronically Signed   By: Charlett NoseKevin  Dover  M.D.   On: 05/21/2014 14:49     MDM   1. Rib contusion, left, initial encounter   2. Fall from ladder, initial encounter   3. Left-sided chest wall pain     Ice See dr listed on MCD card For problems, breathing, fever, cough, reck promptly.    Hayden Rasmussenavid Lillia Lengel, NP 05/21/14 1501

## 2014-05-21 NOTE — Discharge Instructions (Signed)
Chest Contusion A chest contusion is a deep bruise on your chest area. Contusions are the result of an injury that caused bleeding under the skin. A chest contusion may involve bruising of the skin, muscles, or ribs. The contusion may turn blue, purple, or yellow. Minor injuries will give you a painless contusion, but more severe contusions may stay painful and swollen for a few weeks. CAUSES  A contusion is usually caused by a blow, trauma, or direct force to an area of the body. SYMPTOMS   Swelling and redness of the injured area.  Discoloration of the injured area.  Tenderness and soreness of the injured area.  Pain. DIAGNOSIS  The diagnosis can be made by taking a history and performing a physical exam. An X-ray, CT scan, or MRI may be needed to determine if there were any associated injuries, such as broken bones (fractures) or internal injuries. TREATMENT  Often, the best treatment for a chest contusion is resting, icing, and applying cold compresses to the injured area. Deep breathing exercises may be recommended to reduce the risk of pneumonia. Over-the-counter medicines may also be recommended for pain control. HOME CARE INSTRUCTIONS   Put ice on the injured area.  Put ice in a plastic bag.  Place a towel between your skin and the bag.  Leave the ice on for 15-20 minutes, 03-04 times a day.  Only take over-the-counter or prescription medicines as directed by your caregiver. Your caregiver may recommend avoiding anti-inflammatory medicines (aspirin, ibuprofen, and naproxen) for 48 hours because these medicines may increase bruising.  Rest the injured area.  Perform deep-breathing exercises as directed by your caregiver.  Stop smoking if you smoke.  Do not lift objects over 5 pounds (2.3 kg) for 3 days or longer if recommended by your caregiver. SEEK IMMEDIATE MEDICAL CARE IF:   You have increased bruising or swelling.  You have pain that is getting worse.  You have  difficulty breathing.  You have dizziness, weakness, or fainting.  You have blood in your urine or stool.  You cough up or vomit blood.  Your swelling or pain is not relieved with medicines. MAKE SURE YOU:   Understand these instructions.  Will watch your condition.  Will get help right away if you are not doing well or get worse. Document Released: 07/27/2001 Document Revised: 07/26/2012 Document Reviewed: 04/24/2012 [facility] Patient Information 2015 [place], [place]. This information is not intended to replace advice given to you by your health care provider. Make sure you discuss any questions you have with your health care provider.  Chest Wall Pain Chest wall pain is pain in or around the bones and muscles of your chest. It may take up to 6 weeks to get better. It may take longer if you must stay physically active in your work and activities.  CAUSES  Chest wall pain may happen on its own. However, it may be caused by:  A viral illness like the flu.  Injury.  Coughing.  Exercise.  Arthritis.  Fibromyalgia.  Shingles. HOME CARE INSTRUCTIONS   Avoid overtiring physical activity. Try not to strain or perform activities that cause pain. This includes any activities using your chest or your abdominal and side muscles, especially if heavy weights are used.  Put ice on the sore area.  Put ice in a plastic bag.  Place a towel between your skin and the bag.  Leave the ice on for 15-20 minutes per hour while awake for the first 2 days.  Only take over-the-counter or prescription medicines for pain, discomfort, or fever as directed by your caregiver. SEEK IMMEDIATE MEDICAL CARE IF:   Your pain increases, or you are very uncomfortable.  You have a fever.  Your chest pain becomes worse.  You have new, unexplained symptoms.  You have nausea or vomiting.  You feel sweaty or lightheaded.  You have a cough with phlegm (sputum), or you cough up blood. MAKE SURE YOU:    Understand these instructions.  Will watch your condition.  Will get help right away if you are not doing well or get worse. Document Released: 11/01/2005 Document Revised: 01/24/2012 Document Reviewed: 06/28/2011 [facility] Patient Information 2015 [place], [place]. This information is not intended to replace advice given to you by your health care provider. Make sure you discuss any questions you have with your health care provider.  Rib Contusion A rib contusion (bruise) can occur by a blow to the chest or by a fall against a hard object. Usually these will be much better in a couple weeks. If X-rays were taken today and there are no broken bones (fractures), the diagnosis of bruising is made. However, broken ribs may not show up for several days, or may be discovered later on a routine X-ray when signs of healing show up. If this happens to you, it does not mean that something was missed on the X-ray, but simply that it did not show up on the first X-rays. Earlier diagnosis will not usually change the treatment. HOME CARE INSTRUCTIONS   Avoid strenuous activity. Be careful during activities and avoid bumping the injured ribs. Activities that pull on the injured ribs and cause pain should be avoided, if possible.  For the first day or two, an ice pack used every 20 minutes while awake may be helpful. Put ice in a plastic bag and put a towel between the bag and the skin.  Eat a normal, well-balanced diet. Drink plenty of fluids to avoid constipation.  Take deep breaths several times a day to keep lungs free of infection. Try to cough several times a day. Splint the injured area with a pillow while coughing to ease pain. Coughing can help prevent pneumonia.  Wear a rib belt or binder only if told to do so by your caregiver. If you are wearing a rib belt or binder, you must do the breathing exercises as directed by your caregiver. If not used properly, rib belts or binders restrict breathing  which can lead to pneumonia.  Only take over-the-counter or prescription medicines for pain, discomfort, or fever as directed by your caregiver. SEEK MEDICAL CARE IF:   You or your child has an oral temperature above 102 F (38.9 C).  Your baby is older than 3 months with a rectal temperature of 100.5 F (38.1 C) or higher for more than 1 day.  You develop a cough, with thick or bloody sputum. SEEK IMMEDIATE MEDICAL CARE IF:   You have difficulty breathing.  You feel sick to your stomach (nausea), have vomiting or belly (abdominal) pain.  You have worsening pain, not controlled with medications, or there is a change in the location of the pain.  You develop sweating or radiation of the pain into the arms, jaw or shoulders, or become light headed or faint.  You or your child has an oral temperature above 102 F (38.9 C), not controlled by medicine.  Your or your baby is older than 3 months with a rectal temperature of 102 F (  38.9 C) or higher.  Your baby is 303 months old or younger with a rectal temperature of 100.4 F (38 C) or higher. MAKE SURE YOU:   Understand these instructions.  Will watch your condition.  Will get help right away if you are not doing well or get worse. Document Released: 07/27/2001 Document Revised: 02/26/2013 Document Reviewed: 06/19/2008 [facility] Patient Information 2015 [place], [place]. This information is not intended to replace advice given to you by your health care provider. Make sure you discuss any questions you have with your health care provider.

## 2014-05-21 NOTE — ED Provider Notes (Signed)
Medical screening examination/treatment/procedure(s) were performed by non-physician practitioner and as supervising physician I was immediately available for consultation/collaboration.  Leslee Homeavid Mera Gunkel, M.D.   Reuben Likesavid C Sinan Tuch, MD 05/21/14 [phone]

## 2014-05-21 NOTE — ED Notes (Signed)
C/o pain in left flank x 2 weeks, no change today

## 2014-11-13 ENCOUNTER — Emergency Department (HOSPITAL_COMMUNITY): Payer: Medicare Other

## 2014-11-13 ENCOUNTER — Emergency Department (HOSPITAL_COMMUNITY)
Admission: EM | Admit: 2014-11-13 | Discharge: 2014-11-14 | Disposition: A | Discharge status: Home / Self Care | Payer: Medicare Other | Attending: Emergency Medicine | Admitting: Emergency Medicine

## 2014-11-13 ENCOUNTER — Encounter (HOSPITAL_COMMUNITY): Payer: Self-pay | Admitting: *Deleted

## 2014-11-13 DIAGNOSIS — R079 Chest pain, unspecified: Secondary | ICD-10-CM | POA: Diagnosis not present

## 2014-11-13 DIAGNOSIS — Z79899 Other long term (current) drug therapy: Secondary | ICD-10-CM | POA: Insufficient documentation

## 2014-11-13 DIAGNOSIS — R0789 Other chest pain: Secondary | ICD-10-CM | POA: Insufficient documentation

## 2014-11-13 DIAGNOSIS — R42 Dizziness and giddiness: Secondary | ICD-10-CM | POA: Insufficient documentation

## 2014-11-13 DIAGNOSIS — Z720 Tobacco use: Secondary | ICD-10-CM | POA: Insufficient documentation

## 2014-11-13 DIAGNOSIS — R0602 Shortness of breath: Secondary | ICD-10-CM | POA: Diagnosis not present

## 2014-11-13 LAB — CBC
HCT: 39.2 % (ref 39.0–52.0)
Hemoglobin: 13.1 g/dL (ref 13.0–17.0)
MCH: 29.9 pg (ref 26.0–34.0)
MCHC: 33.4 g/dL (ref 30.0–36.0)
MCV: 89.5 fL (ref 78.0–100.0)
PLATELETS: 295 10*3/uL (ref 150–400)
RBC: 4.38 MIL/uL (ref 4.22–5.81)
RDW: 12.8 % (ref 11.5–15.5)
WBC: 7 10*3/uL (ref 4.0–10.5)

## 2014-11-13 LAB — I-STAT TROPONIN, ED: Troponin i, poc: 0 ng/mL (ref 0.00–0.08)

## 2014-11-13 NOTE — ED Notes (Signed)
Pt arrives via EMS from home. C/o chest pain x1 week. States pain is radiating to neck. EMS administered 324 ASA and 1 NTG. EMS reports about 4 mins after NTG pt had a syncopal episode that lasted about 30 secs. EMS states BP was 173/130, after NTG 145/91. Pt pain was 10/10 and after NTG was 7/10. Pt was 85% RA, 100% on 4L [place]. Pt states that he lifts 50-60 lbs at work.

## 2014-11-13 NOTE — ED Notes (Signed)
Xray reports that pt almost passed out. Pt back to room, conscious alert & oriented. sts he got dizziness after nitro.

## 2014-11-13 NOTE — ED Provider Notes (Signed)
CSN: [id number]     Arrival date & time 11/13/14  2235 History   First MD Initiated Contact with Patient 11/13/14 2245     Chief Complaint  Patient presents with  . Chest Pain     (Consider location/radiation/quality/duration/timing/severity/associated sxs/prior Treatment) HPI Damon [name] is a 48 y.o. male with a history of schizophrenia comes in for evaluation of chest pain. 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 Patient states the sharp stabbing pain originated on the left upper chest and then moved to his right upper chest. He does not know of anything that makes the discomfort better but knows that moving his right arm worsens the pain. He reports associated dizziness at the time. Denies any headache, vision changes, shortness of breath, nausea or vomiting, abdominal pain, numbness or weakness, diaphoresis.  Past Medical History  Diagnosis Date  . Paranoid schizophrenia   . Depression    History reviewed. No pertinent past surgical history. No family history on file. History  Substance Use Topics  . Smoking status: Current Every Day Smoker -- 0.50 packs/day  . Smokeless tobacco: Current User    Types: Chew  . Alcohol Use: 0.6 oz/week    1 Cans of beer per week     Comment: one can of beer weekly    Review of Systems  A 10 point review of systems was completed and was negative except for pertinent positives and negatives as mentioned in the history of present illness    Allergies  Spinach  Home Medications   Prior to Admission medications   Medication Sig Start Date End Date Taking? Authorizing Provider  benztropine (COGENTIN) 1 MG tablet Take 1 tablet (1 mg total) by mouth 2 (two) times daily. For EPS 01/11/13  Yes Verne SpurrNeil Mashburn, PA-C  risperiDONE (RISPERDAL) 3 MG tablet Take 1 tablet (3 mg total) by mouth at bedtime. For psychosis.  01/11/13  Yes Verne SpurrNeil Mashburn, PA-C  diclofenac (CATAFLAM) 50 MG tablet Take 1 tablet (50 mg total) by mouth 3 (three) times daily. One tablet TID with food prn pain. Patient not taking: Reported on 11/14/2014 05/21/14   Hayden Rasmussenavid Mabe, NP  FLUoxetine (PROZAC) 20 MG capsule Take 1 capsule (20 mg total) by mouth daily. For depression. Patient not taking: Reported on 11/14/2014 01/11/13   Verne SpurrNeil Mashburn, PA-C  naproxen (NAPROSYN) 500 MG tablet Take 1 tablet (500 mg total) by mouth 2 (two) times daily. 11/14/14   Earle GellBenjamin W Mikaila Grunert, PA-C  traMADol (ULTRAM) 50 MG tablet Take 1 tablet (50 mg total) by mouth every 6 (six) hours as needed. Patient not taking: Reported on 11/14/2014 05/21/14   Hayden Rasmussenavid Mabe, NP   BP 120/82 mmHg  Pulse 82  Temp(Src) 97.8 F (36.6 C) (Oral)  Resp 15  Ht 6' (1.829 m)  Wt 183 lb (83.008 kg)  BMI 24.81 kg/m2  SpO2 100% Physical Exam  Constitutional: He is oriented to person, place, and time. He appears well-developed and well-nourished.  HENT:  Head: Normocephalic and atraumatic.  Mouth/Throat: Oropharynx is clear and moist.  Eyes: Conjunctivae are normal. Pupils are equal, round, and reactive to light. Right eye exhibits no discharge. Left eye exhibits no discharge. No scleral icterus.  Neck: Neck supple.  Cardiovascular: Normal rate, regular rhythm and normal heart sounds.   Pulmonary/Chest: Effort normal and breath sounds normal. No respiratory distress.  He has no wheezes. He has no rales.  Patient is 100% on room air. No evidence of respiratory distress. Palpation of patient's right axilla and upper pectoral reproduces sharp stabbing sensation patient experienced today.  Abdominal: Soft. There is no tenderness.  Musculoskeletal: He exhibits no tenderness.  Neurological: He is alert and oriented to person, place, and time.  Cranial Nerves II-XII grossly intact  Skin: Skin is warm and dry. No rash noted.  Psychiatric: He has a normal mood and affect.  Nursing note and vitals  reviewed.   ED Course  Procedures (including critical care time) Labs Review Labs Reviewed  BASIC METABOLIC PANEL - Abnormal; Notable for the following:    Glucose, Bld 104 (*)    All other components within normal limits  CBC  BRAIN NATRIURETIC PEPTIDE  I-STAT TROPOININ, ED  Rosezena SensorI-STAT TROPOININ, ED    Imaging Review Dg Chest 2 View  11/13/2014   CLINICAL DATA:  Chest pain.  Shortness of breath and dizziness.  EXAM: CHEST  2 VIEW  COMPARISON:  05/21/2014 and 01/06/2013.  FINDINGS: Lateral view degraded by patient arm position. Numerous leads and wires project over the chest. Midline trachea. Normal heart size and mediastinal contours. No pleural effusion or pneumothorax. Clear lungs.  IMPRESSION: No acute cardiopulmonary disease.   Electronically Signed   By: Jeronimo GreavesKyle  Talbot M.D.   On: 11/13/2014 23:29     EKG Interpretation None     Meds given in ED:  Medications  ibuprofen (ADVIL,MOTRIN) tablet 800 mg (not administered)    New Prescriptions   NAPROXEN (NAPROSYN) 500 MG TABLET    Take 1 tablet (500 mg total) by mouth 2 (two) times daily.   Filed Vitals:   11/13/14 2345 11/14/14 0000 11/14/14 0015 11/14/14 0030  BP: 123/80 123/84 122/80 120/82  Pulse: 77 80 83 82  Temp:      TempSrc:      Resp: 18 16 13 15   Height:      Weight:      SpO2: 97% 99% 99% 100%    MDM  Damon Albrightlijah L Pitney is a 48 y.o. 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  Vitals stable - WNL -afebrile Pt resting comfortably in ED. Reports this chest discomfort has improved since being in the ED. States he feels much better and is ready to go home. PE--reproduction of same pain upon palpation of right pectoral muscle. Right arm range of motion is also aggravates pain Labwork--noncontributory. Troponin negative. EKG not concerning. Imaging--chest x-ray shows no acute cardiopulmonary pathology  DDX--given  timing of symptoms and presentation after moving heavy object, patient is likely suffering from musculoskeletal strain. Low concern for other emergent pathology at this time. Patient is PERC negative. Presentation and clinical picture is not consistent with ACS. Will DC with naproxen and instructions to follow-up with primary care. Discussed f/u with PCP and return precautions, pt very amenable to plan. Patient stable, in good condition and is appropriate for discharge  Prior to patient discharge, I discussed and reviewed this case with Dr. Juleen ChinaKohut    Final diagnoses:  Chest wall discomfort        Sharlene MottsBenjamin W Kaeli Nichelson, PA-C 11/14/14 0126  Raeford RazorStephen Kohut, MD 11/14/14 [phone]

## 2014-11-14 LAB — BASIC METABOLIC PANEL
ANION GAP: 10 (ref 5–15)
BUN: 9 mg/dL (ref 6–23)
CHLORIDE: 107 meq/L (ref 96–112)
CO2: 23 mmol/L (ref 19–32)
Calcium: 9.6 mg/dL (ref 8.4–10.5)
Creatinine, Ser: 0.92 mg/dL (ref 0.50–1.35)
GFR calc Af Amer: >90 mL/min (ref >90)
GFR calc non Af Amer: >90 mL/min (ref >90)
GLUCOSE: 104 mg/dL — AB (ref 70–99)
Potassium: 3.8 mmol/L (ref 3.5–5.1)
Sodium: 140 mmol/L (ref 135–145)

## 2014-11-14 LAB — BRAIN NATRIURETIC PEPTIDE: B Natriuretic Peptide: 13.1 pg/mL (ref 0.0–100.0)

## 2014-11-14 MED ORDER — NAPROXEN 500 MG PO TABS: 500.0000 mg | ORAL_TABLET | Freq: Two times a day (BID) | ORAL | Status: DC

## 2014-11-14 MED ORDER — IBUPROFEN 800 MG PO TABS
800.0000 mg | ORAL_TABLET | Freq: Once | ORAL | Status: AC
  Administered 2014-11-14: 800 mg via ORAL
  Filled 2014-11-14: qty 1

## 2014-11-14 NOTE — Discharge Instructions (Signed)
Chest Wall Pain Chest wall pain is pain felt in or around the chest bones and muscles. It may take up to 6 weeks to get better. It may take longer if you are active. Chest wall pain can happen on its own. Other times, things like germs, injury, coughing, or exercise can cause the pain. HOME CARE   Avoid activities that make you tired or cause pain. Try not to use your chest, belly (abdominal), or side muscles. Do not use heavy weights.  Put ice on the sore area.  Put ice in a plastic bag.  Place a towel between your skin and the bag.  Leave the ice on for 15-20 minutes for the first 2 days.  Only take medicine as told by your doctor. GET HELP RIGHT AWAY IF:   You have more pain or are very uncomfortable.  You have a fever.  Your chest pain gets worse.  You have new problems.  You feel sick to your stomach (nauseous) or throw up (vomit).  You start to sweat or feel lightheaded.  You have a cough with mucus (phlegm).  You cough up blood. MAKE SURE YOU:   Understand these instructions.  Will watch your condition.  Will get help right away if you are not doing well or get worse. Document Released: 04/19/2008 Document Revised: 01/24/2012 Document Reviewed: 06/28/2011 [facility] Patient Information 2015 [place], [place]. This information is not intended to replace advice given to you by your health care provider. Make sure you discuss any questions you have with your health care provider.   Please follow-up with primary care or call health and wellness for further evaluation and management of your symptoms. You may take naproxen as needed for chest discomfort he may experience. You were evaluated for your chest pain in the ED. There does not appear to be an emergent cause for your symptoms at this time. Please return to ED for worsening symptoms   Emergency Department Resource Guide 1) Find a Doctor and Pay Out of Pocket Although you won't have to find out who is covered by  your insurance plan, it is a good idea to ask around and get recommendations. You will then need to call the office and see if the doctor you have chosen will accept you as a new patient and what types of options they offer for patients who are self-pay. Some doctors offer discounts or will set up payment plans for their patients who do not have insurance, but you will need to ask so you aren't surprised when you get to your appointment.  2) Contact Your Local Health Department Not all health departments have doctors that can see patients for sick visits, but many do, so it is worth a call to see if yours does. If you don't know where your local health department is, you can check in your phone book. The CDC also has a tool to help you locate your state's health department, and many state websites also have listings of all of their local health departments.  3) Find a Walk-in Clinic If your illness is not likely to be very severe or complicated, you may want to try a walk in clinic. These are popping up all over the country in pharmacies, drugstores, and shopping centers. They're usually staffed by nurse practitioners or physician assistants that have been trained to treat common illnesses and complaints. They're usually fairly quick and inexpensive. However, if you have serious medical issues or chronic medical problems, these are probably  not your best option.  No Primary Care Doctor: - Call Health Connect at  [phone] - they can help you locate a primary care doctor that  accepts your insurance, provides certain services, etc. - Physician Referral Service- [phone]  Chronic Pain Problems: Organization         Address  Phone   Notes  [facility]  [phone] Patients need to be referred by their primary care doctor.   Medication Assistance: Organization         Address  Phone   Notes  [facility] Medication [facility] [address] [place], [place] [address] [phone] --Must be a resident of [place] -- Must have NO insurance coverage whatsoever (no Medicaid/ Medicare, etc.) -- The pt. MUST have a primary care doctor that directs their care regularly and follows them in the community   MedAssist  [phone]) [phone]   [employer]  [phone]) [phone]    Agencies that provide inexpensive medical care: Organization         Address  Phone   Notes  [facility]  [phone]) [phone]   [facility]    [phone]) [phone]   [facility] [address] [place], [place] [address] [phone]   [facility] [address], [place] [phone]) [phone]   Planned Parenthood    [phone](336) [phone]   [facility]    [phone]   [facility] and [facility]  [address], [place] Phone:  [phone](336) [phone], Fax:  [phone] Hours of Operation:  9 am - 6 pm, M-F.  Also accepts Medicaid/Medicare and self-pay.  [facility]  [address], [place] Phone: [phone]) [phone], Fax: [phone]) [phone]. Hours of Operation:  8:30 am - 5:30 pm, M-F.  Also accepts Medicaid and self-pay.  [facility] [place] [address], [place] Phone: [phone]) [phone]   [facility] [address], [place], [place] [phone], Ext. [phone] Mondays & Thursdays: 7-9 AM.  First 15 patients are seen on a first come, first serve basis.    Medicaid-accepting [facility] Providers:  Organization         Address  Phone   Notes  [facility] [address], [place] [phone] Also accepts self-pay patients.  [facility] [address], [place]  [phone]) [phone]   [facility] [address], [place] [phone]) [phone]   [facility] [address] [place] [address], [place] [phone]) [phone]   Renaye RakersVeita Bland [address]  [address], [place]   [phone] Only accepts [place] Access [place] patients after they have their name applied to their card.   Self-Pay (no insurance) in [facility] [place]:  Organization         Address  Phone   Notes  Sickle Cell Patients, [facility] [address], [place] [phone]) [phone]   [facility] [address], [place] [phone]   [facility] [place]  [address], [place] [phone]) [phone]   [facility]/Dr. Osei-Bonsu  [address] [place] [address], [place] or [phone], [place] [phone] Phone number for both [place] and [place] locations is the same.  [facility] [address], [place] [phone]   [facility] [address], [place] or [address] [phone]   [facility] [address], [place] [phone], phone; [phone], fax Sees patients 1st and 3rd Saturday of every month.  Must not qualify for public or private insurance (i.e. Medicaid, Medicare, [place] Health Choice, Veterans' Benefits)  Household income should be no more than 200% of the poverty level The clinic cannot treat you if you are pregnant or think you are pregnant  Sexually transmitted diseases are not treated at the clinic.    Dental Care: Organization         Address  Phone  Notes  [facility] [address] [place], [place] [phone] Accepts children up to age 27 who are enrolled in [place] or [place] Health Choice; pregnant women with a Medicaid card; and children who have applied for Medicaid or [place] Health Choice, but were declined, whose parents can pay a reduced fee at time of service.  [facility] Department of [facility] [place]  [address], [place] [phone] Accepts children up to age 71 who are enrolled  in [place] or [place] Health Choice; pregnant women with a Medicaid card; and children who have applied for Medicaid or [place] Health Choice, but were declined, whose parents can pay a reduced fee at time of service.  [facility]  [address] [place], [place] [phone] Patients are seen by appointment only. Walk-ins are not accepted. [facility] will see patients 61 years of age and older. Monday - Tuesday (8am-5pm) Most Wednesdays (8:30-5pm) $30 per visit, cash only  [facility] Adult [facility] Access PROGRAM  [address], [facility] [phone] Patients are seen by appointment only. Walk-ins are not accepted. [facility] will see patients 2 years of age and older. One Wednesday Evening (Monthly: Volunteer Based).  $30 per visit, cash only  [employer]  [phone] for adults; Children under age 51, call Graduate Pediatric Dentistry at [phone]. Children aged 29-14, please call [phone] to request a pediatric application.  Dental services are provided in all areas of dental care including fillings, crowns and bridges, complete and partial dentures, implants, gum treatment, root canals, and extractions. Preventive care is also provided. Treatment is provided to both adults and children. Patients are selected via a lottery and there is often a waiting list.   [facility] [address], [place]  [phone] [email or web address]   [facility] [address] [place], [place] [phone], Ext. [phone] Second and Fourth Thursday of each month, opens at 6:30 AM; Clinic ends at 9 AM.  Patients are seen on a first-come first-served basis, and a limited number are seen during each clinic.   [facility]  [address] Ether Griffins [place], [place] [phone]   Eligibility Requirements You must have lived in [place], [place], or [place] counties for at least the last three months.   You cannot be  eligible for state or federal sponsored [place], including [employer], [place], or [employer].   You generally cannot be eligible for healthcare insurance through your employer.    How to apply: Eligibility screenings are held every Tuesday and Wednesday afternoon from 1:00 pm until 4:00 pm. You do not need an appointment for the interview!  [facility] [phone]  [place], [place], [place] [phone]   [facility]  [phone]   [facility]  [phone]   [facility]  [phone]    Behavioral Health Resources in the Community: Intensive Outpatient Programs Organization         Address  Phone  Notes  [facility] [address], [place], [place] [phone]   [facility] Outpatient [address] [place], [place] [phone]   [facility] [address], [place], [place]  [phone]   [facility] Mental Health [address]  [place], [place] [phone] or [phone]   Substance Abuse Resources Organization         Address  Phone  Notes  [facility] Drug [facility]  [phone]   [facility]  [phone]   [facility] [place]  [phone]   Floydene Flock  [phone]   Residential & Outpatient Substance Abuse Program  [phone]   Psychological Services Organization         Address  Phone  Notes  [facility] Behavioral Health  [phone]   [facility]  [phone]   [facility] Mental Health [address], [place] [phone] or [phone]    Mobile Crisis Teams Organization         Address  Phone  Notes  [facility], Mobile Crisis Care Unit  [phone]   [facility]  [address] [place], [place] [phone]   Doristine Locks [address] [place] [phone]    Self-Help/Support Groups Organization          Address  Phone             Notes  [facility] [place] - variety of support groups  336- I7437963 Call for more information  Narcotics Anonymous (NA), [facility] [address], [employer] [place]  2 meetings at this location   Statistician         Address  Phone  Notes  [facility] [address],    [place]  [phone]   [facility] [place] [facility]  [address] [place] [address], [place], [place] [phone]   [facility] [address] [place], [place] [phone] Admissions: 8am-3pm M-F  [facility] [address]    [place], [place] [phone]   [facility] [address] [place], [place], [place] [phone]   [facility] [address]  [place], [place] [phone]   [facility] - Intensive Outpatient [address], [place], [place] [phone]   [facility] ([facility] [address]  [place], [place] [phone] or [phone]   [facility] ([facility]) [address], [place], [place] [phone] Accepts Medicaid  [facility] [place] [address]  [place] [phone] Substance Abuse/Addiction Treatment   [facility] Organization         Address  Phone  Notes  [facility]  [phone]   Angie Fava, PhD [address] [place], [place]   [phone] or [phone]   [facility] Behavioral   [address] [place], [place] [phone]   [facility] [address], [place], [place] [phone] Insurance/Medicaid/sponsorship through [employer] and Families [address]  [place], [place] [phone] [place], [place] [phone]    Dr. Adele Schilder  [phone]   [facility] Dept. 1) [address] [place] 2) [place] 3)  [place] [address], [place] [phone]  [phone]   [place] [phone] or [phone] (After Hours)

## 2015-12-30 DIAGNOSIS — F209 Schizophrenia, unspecified: Secondary | ICD-10-CM | POA: Diagnosis not present

## 2016-01-01 DIAGNOSIS — F209 Schizophrenia, unspecified: Secondary | ICD-10-CM | POA: Diagnosis not present

## 2016-01-01 DIAGNOSIS — F251 Schizoaffective disorder, depressive type: Secondary | ICD-10-CM | POA: Diagnosis not present

## 2016-01-29 DIAGNOSIS — F209 Schizophrenia, unspecified: Secondary | ICD-10-CM | POA: Diagnosis not present

## 2016-04-14 DIAGNOSIS — F209 Schizophrenia, unspecified: Secondary | ICD-10-CM | POA: Diagnosis not present

## 2016-08-12 DIAGNOSIS — F209 Schizophrenia, unspecified: Secondary | ICD-10-CM | POA: Diagnosis not present

## 2016-11-03 DIAGNOSIS — F209 Schizophrenia, unspecified: Secondary | ICD-10-CM | POA: Diagnosis not present

## 2017-01-25 DIAGNOSIS — F209 Schizophrenia, unspecified: Secondary | ICD-10-CM | POA: Diagnosis not present

## 2017-04-05 DIAGNOSIS — F209 Schizophrenia, unspecified: Secondary | ICD-10-CM | POA: Diagnosis not present

## 2017-04-05 DIAGNOSIS — Z79899 Other long term (current) drug therapy: Secondary | ICD-10-CM | POA: Diagnosis not present

## 2017-07-07 DIAGNOSIS — F209 Schizophrenia, unspecified: Secondary | ICD-10-CM | POA: Diagnosis not present

## 2017-09-05 DIAGNOSIS — R5383 Other fatigue: Secondary | ICD-10-CM | POA: Diagnosis not present

## 2017-09-05 DIAGNOSIS — Z83438 Family history of other disorder of lipoprotein metabolism and other lipidemia: Secondary | ICD-10-CM | POA: Diagnosis not present

## 2017-09-05 DIAGNOSIS — G4700 Insomnia, unspecified: Secondary | ICD-10-CM | POA: Diagnosis not present

## 2017-09-05 DIAGNOSIS — F209 Schizophrenia, unspecified: Secondary | ICD-10-CM | POA: Diagnosis not present

## 2017-09-05 DIAGNOSIS — E785 Hyperlipidemia, unspecified: Secondary | ICD-10-CM | POA: Diagnosis not present

## 2017-09-27 DIAGNOSIS — F209 Schizophrenia, unspecified: Secondary | ICD-10-CM | POA: Diagnosis not present

## 2017-10-11 ENCOUNTER — Encounter (HOSPITAL_COMMUNITY): Payer: Self-pay | Admitting: Emergency Medicine

## 2017-10-11 ENCOUNTER — Emergency Department (HOSPITAL_COMMUNITY)
Admission: EM | Admit: 2017-10-11 | Discharge: 2017-10-12 | Disposition: A | Discharge status: Other Acute Inpatient Hospital | Payer: Medicare Other | Attending: Emergency Medicine | Admitting: Emergency Medicine

## 2017-10-11 ENCOUNTER — Other Ambulatory Visit: Payer: Self-pay

## 2017-10-11 ENCOUNTER — Emergency Department (HOSPITAL_COMMUNITY): Payer: Medicare Other

## 2017-10-11 DIAGNOSIS — Y929 Unspecified place or not applicable: Secondary | ICD-10-CM | POA: Insufficient documentation

## 2017-10-11 DIAGNOSIS — F17220 Nicotine dependence, chewing tobacco, uncomplicated: Secondary | ICD-10-CM | POA: Insufficient documentation

## 2017-10-11 DIAGNOSIS — Y999 Unspecified external cause status: Secondary | ICD-10-CM | POA: Insufficient documentation

## 2017-10-11 DIAGNOSIS — R440 Auditory hallucinations: Secondary | ICD-10-CM

## 2017-10-11 DIAGNOSIS — R55 Syncope and collapse: Secondary | ICD-10-CM | POA: Diagnosis not present

## 2017-10-11 DIAGNOSIS — S0993XA Unspecified injury of face, initial encounter: Secondary | ICD-10-CM | POA: Diagnosis not present

## 2017-10-11 DIAGNOSIS — S0990XA Unspecified injury of head, initial encounter: Secondary | ICD-10-CM

## 2017-10-11 DIAGNOSIS — Z79899 Other long term (current) drug therapy: Secondary | ICD-10-CM | POA: Diagnosis not present

## 2017-10-11 DIAGNOSIS — F329 Major depressive disorder, single episode, unspecified: Secondary | ICD-10-CM | POA: Diagnosis not present

## 2017-10-11 DIAGNOSIS — Y939 Activity, unspecified: Secondary | ICD-10-CM | POA: Insufficient documentation

## 2017-10-11 DIAGNOSIS — Z046 Encounter for general psychiatric examination, requested by authority: Secondary | ICD-10-CM | POA: Diagnosis not present

## 2017-10-11 DIAGNOSIS — W19XXXA Unspecified fall, initial encounter: Secondary | ICD-10-CM | POA: Diagnosis not present

## 2017-10-11 DIAGNOSIS — R51 Headache: Secondary | ICD-10-CM | POA: Insufficient documentation

## 2017-10-11 LAB — COMPREHENSIVE METABOLIC PANEL
ALT: 27 U/L (ref 17–63)
AST: 25 U/L (ref 15–41)
Albumin: 4.2 g/dL (ref 3.5–5.0)
Alkaline Phosphatase: 62 U/L (ref 38–126)
Anion gap: 8 (ref 5–15)
BUN: 14 mg/dL (ref 6–20)
CO2: 25 mmol/L (ref 22–32)
Calcium: 9.4 mg/dL (ref 8.9–10.3)
Chloride: 105 mmol/L (ref 101–111)
Creatinine, Ser: 1.07 mg/dL (ref 0.61–1.24)
Glucose, Bld: 89 mg/dL (ref 65–99)
POTASSIUM: 3.8 mmol/L (ref 3.5–5.1)
SODIUM: 138 mmol/L (ref 135–145)
Total Bilirubin: 0.4 mg/dL (ref 0.3–1.2)
Total Protein: 7.7 g/dL (ref 6.5–8.1)

## 2017-10-11 LAB — RAPID URINE DRUG SCREEN, HOSP PERFORMED
AMPHETAMINES: NOT DETECTED
BARBITURATES: NOT DETECTED
Benzodiazepines: NOT DETECTED
Cocaine: NOT DETECTED
Opiates: NOT DETECTED
TETRAHYDROCANNABINOL: NOT DETECTED

## 2017-10-11 LAB — CBC
HEMATOCRIT: 41.2 % (ref 39.0–52.0)
Hemoglobin: 13.9 g/dL (ref 13.0–17.0)
MCH: 30 pg (ref 26.0–34.0)
MCHC: 33.7 g/dL (ref 30.0–36.0)
MCV: 89 fL (ref 78.0–100.0)
Platelets: 323 10*3/uL (ref 150–400)
RBC: 4.63 MIL/uL (ref 4.22–5.81)
RDW: 13.4 % (ref 11.5–15.5)
WBC: 7.1 10*3/uL (ref 4.0–10.5)

## 2017-10-11 LAB — ETHANOL: Alcohol, Ethyl (B): <10 mg/dL (ref <10)

## 2017-10-11 MED ORDER — ALUM & MAG HYDROXIDE-SIMETH 200-200-20 MG/5ML PO SUSP: 30.0000 mL | Freq: Four times a day (QID) | ORAL | Status: DC | PRN

## 2017-10-11 MED ORDER — IBUPROFEN 400 MG PO TABS
600.0000 mg | ORAL_TABLET | Freq: Three times a day (TID) | ORAL | Status: DC | PRN
  Administered 2017-10-12: 600 mg via ORAL
  Filled 2017-10-11: qty 1

## 2017-10-11 MED ORDER — IBUPROFEN 800 MG PO TABS
800.0000 mg | ORAL_TABLET | Freq: Once | ORAL | Status: AC
  Administered 2017-10-11: 800 mg via ORAL
  Filled 2017-10-11: qty 1

## 2017-10-11 MED ORDER — BENZTROPINE MESYLATE 1 MG PO TABS
1.0000 mg | ORAL_TABLET | Freq: Two times a day (BID) | ORAL | Status: DC
  Administered 2017-10-11: 1 mg via ORAL
  Filled 2017-10-11: qty 1

## 2017-10-11 MED ORDER — ZOLPIDEM TARTRATE 5 MG PO TABS: 5.0000 mg | ORAL_TABLET | Freq: Every evening | ORAL | Status: DC | PRN

## 2017-10-11 MED ORDER — ONDANSETRON HCL 4 MG PO TABS: 4.0000 mg | ORAL_TABLET | Freq: Three times a day (TID) | ORAL | Status: DC | PRN

## 2017-10-11 MED ORDER — RISPERIDONE 3 MG PO TABS
3.0000 mg | ORAL_TABLET | Freq: Every day | ORAL | Status: DC
  Administered 2017-10-11: 3 mg via ORAL
  Filled 2017-10-11: qty 1

## 2017-10-11 NOTE — ED Notes (Signed)
EDP aware of pt BP 

## 2017-10-11 NOTE — ED Notes (Signed)
Called pt name to recheck vitals in waiting room, no response

## 2017-10-11 NOTE — ED Provider Notes (Signed)
[facility] EMERGENCY DEPARTMENT Provider Note   CSN: [id number] Arrival date & time: 10/11/17  1431     History   Chief Complaint Chief Complaint  Patient presents with  . Hallucinations  . Fall  . Facial Injury    HPI Damon [name] is a 51 y.o. male.  HPI   51 year old male with history of depression and paranoia schizophrenia presenting for evaluation of a recent fall.  Patient states yesterday he developed some back pain, when he stood up and states the next he knew he fell to the ground and had a syncopal episode.  He believes he may have been syncopized for approximately an minute.  It was unwitnessed.  He woke up with pain to the left side of his face from the impact as well as having auditory hallucination.  States that he is hearing voices telling him to hurt himself and that he is worthless.  He has had auditory hallucination in the past but related to medication changes.  At this time he denies having any active SI or HI.  He states feeling confused throughout the day, and having bouts of forgetfulness.  He does feel depressed but did not go into detail as to what causes depression.  Report having occasional bouts of lightheadedness and blurry vision.  He did try taking some Tylenol earlier but it did not help.  States that he has been compliant with his medication.  Denies any recent alcohol or drug use.  Does endorse being a social smoker.  Eating and drinking fine.   Past Medical History:  Diagnosis Date  . Depression   . Paranoid schizophrenia [facility])     Patient Active Problem List   Diagnosis Date Noted  . Schizophrenia (HCC) 10/10/2012  . Paranoid schizophrenia (HCC)   . Depression   . Schizophrenia, paranoid type (HCC) 11/12/2011    Class: Acute    History reviewed. No pertinent surgical history.     Home Medications    Prior to Admission medications   Medication Sig Start Date End Date Taking? Authorizing Provider  benztropine  (COGENTIN) 1 MG tablet Take 1 tablet (1 mg total) by mouth 2 (two) times daily. For EPS 01/11/13   Verne Spurr T, PA-C  diclofenac (CATAFLAM) 50 MG tablet Take 1 tablet (50 mg total) by mouth 3 (three) times daily. One tablet TID with food prn pain. Patient not taking: Reported on 11/14/2014 05/21/14   Hayden Rasmussen, NP  FLUoxetine (PROZAC) 20 MG capsule Take 1 capsule (20 mg total) by mouth daily. For depression. Patient not taking: Reported on 11/14/2014 01/11/13   Verne Spurr T, PA-C  naproxen (NAPROSYN) 500 MG tablet Take 1 tablet (500 mg total) by mouth 2 (two) times daily. 11/14/14   Cartner, Sharlet Salina, PA-C  risperiDONE (RISPERDAL) 3 MG tablet Take 1 tablet (3 mg total) by mouth at bedtime. For psychosis. 01/11/13   Tamala Julian, PA-C  traMADol (ULTRAM) 50 MG tablet Take 1 tablet (50 mg total) by mouth every 6 (six) hours as needed. Patient not taking: Reported on 11/14/2014 05/21/14   Hayden Rasmussen, NP    Family History No family history on file.  Social History Social History   Tobacco Use  . Smoking status: Current Every Day Smoker    Packs/day: 0.50  . Smokeless tobacco: Current User    Types: Chew  Substance Use Topics  . Alcohol use: Yes    Alcohol/week: 0.6 oz    Types: 1 Cans  of beer per week    Comment: one can of beer weekly  . Drug use: No     Allergies   Spinach   Review of Systems Review of Systems  All other systems reviewed and are negative.    Physical Exam Updated Vital Signs BP (!) 149/109 (BP Location: Right Arm)   Pulse 97   Temp 99.3 F (37.4 C) (Oral)   Resp 18   SpO2 100%   Physical Exam  Constitutional: He is oriented to person, place, and time. He appears well-developed and well-nourished. No distress.  HENT:  Head: Normocephalic and atraumatic.  Tenderness along the left zygomatic and left temporal region on palpation without any bruising swelling abrasion or crepitus.  No hemotympanum, no septal hematoma, no malocclusion and no  midface tenderness.  Eyes: Conjunctivae and EOM are normal. Pupils are equal, round, and reactive to light.  Neck: Normal range of motion. Neck supple.  No cervical midline spine tenderness  Cardiovascular: Normal rate and regular rhythm.  Pulmonary/Chest: Effort normal and breath sounds normal.  Abdominal: Soft. He exhibits no distension. There is no tenderness.  Musculoskeletal:  No significant midline spine tenderness crepitus or step-off.  Neurological: He is alert and oriented to person, place, and time. He has normal strength. No cranial nerve deficit or sensory deficit. GCS eye subscore is 4. GCS verbal subscore is 5. GCS motor subscore is 6.  Skin: No rash noted.  Psychiatric: He has a normal mood and affect. His speech is normal and behavior is normal. He expresses no homicidal and no suicidal ideation.  Nursing note and vitals reviewed.    ED Treatments / Results  Labs (all labs ordered are listed, but only abnormal results are displayed) Labs Reviewed  COMPREHENSIVE METABOLIC PANEL  CBC  ETHANOL  RAPID URINE DRUG SCREEN, HOSP PERFORMED    EKG  EKG Interpretation None       Radiology Ct Head Wo Contrast  Result Date: 10/11/2017 CLINICAL DATA:  Status post blunt head trauma with headache. EXAM: CT HEAD WITHOUT CONTRAST CT MAXILLOFACIAL WITHOUT CONTRAST TECHNIQUE: Multidetector CT imaging of the head and maxillofacial structures were performed using the standard protocol without intravenous contrast. Multiplanar CT image reconstructions of the maxillofacial structures were also generated. COMPARISON:  Brain MRI 07/24/2004 FINDINGS: CT HEAD FINDINGS Brain: No evidence of acute infarction, hemorrhage, hydrocephalus, extra-axial collection or mass lesion/mass effect. Vascular: No hyperdense vessel or unexpected calcification. Skull: Normal. Negative for fracture or focal lesion. Other: None. CT MAXILLOFACIAL FINDINGS Osseous: No fracture or mandibular dislocation. No  destructive process. Orbits: Negative. No traumatic or inflammatory finding. Sinuses: Clear. Soft tissues: Normal. Other: Poor dentition with numerous periapical lucencies and occasional cavities. IMPRESSION: No acute intracranial abnormality. No evidence of facial fractures. Poor dentition.  Dental consultation may be considered. Electronically Signed   By: Ted Mcalpineobrinka  Dimitrova M.D.   On: 10/11/2017 16:03   Ct Maxillofacial Wo Contrast  Result Date: 10/11/2017 CLINICAL DATA:  Status post blunt head trauma with headache. EXAM: CT HEAD WITHOUT CONTRAST CT MAXILLOFACIAL WITHOUT CONTRAST TECHNIQUE: Multidetector CT imaging of the head and maxillofacial structures were performed using the standard protocol without intravenous contrast. Multiplanar CT image reconstructions of the maxillofacial structures were also generated. COMPARISON:  Brain MRI 07/24/2004 FINDINGS: CT HEAD FINDINGS Brain: No evidence of acute infarction, hemorrhage, hydrocephalus, extra-axial collection or mass lesion/mass effect. Vascular: No hyperdense vessel or unexpected calcification. Skull: Normal. Negative for fracture or focal lesion. Other: None. CT MAXILLOFACIAL FINDINGS Osseous: No fracture or mandibular  dislocation. No destructive process. Orbits: Negative. No traumatic or inflammatory finding. Sinuses: Clear. Soft tissues: Normal. Other: Poor dentition with numerous periapical lucencies and occasional cavities. IMPRESSION: No acute intracranial abnormality. No evidence of facial fractures. Poor dentition.  Dental consultation may be considered. Electronically Signed   By: Ted Mcalpineobrinka  Dimitrova M.D.   On: 10/11/2017 16:03    Procedures Procedures (including critical care time)  Medications Ordered in ED Medications  ibuprofen (ADVIL,MOTRIN) tablet 600 mg (not administered)  zolpidem (AMBIEN) tablet 5 mg (not administered)  ondansetron (ZOFRAN) tablet 4 mg (not administered)  alum & mag hydroxide-simeth (MAALOX/MYLANTA)  200-200-20 MG/5ML suspension 30 mL (not administered)  benztropine (COGENTIN) tablet 1 mg (not administered)  risperiDONE (RISPERDAL) tablet 3 mg (not administered)  ibuprofen (ADVIL,MOTRIN) tablet 800 mg (800 mg Oral Given 10/11/17 2021)     Initial Impression / Assessment and Plan / ED Course  I have reviewed the triage vital signs and the nursing notes.  Pertinent labs & imaging results that were available during my care of the patient were reviewed by me and considered in my medical decision making (see chart for details).     BP (!) 155/89   Pulse 79   Temp 98.4 F (36.9 C) (Oral)   Resp 16   SpO2 98%    Final Clinical Impressions(s) / ED Diagnoses   Final diagnoses:  Injury of head, initial encounter  Auditory hallucination    ED Discharge Orders    None     8:06 PM Patient report having an unwitnessed syncopal episode last night and now having auditory hallucinations with command hallucinations stating that he needs to hurt himself and that he is worthless.  Does have history of schizophrenia and has been compliant with his medication.  He would need to be evaluated further by a psychiatrist for his command hallucination.  He felt and hurts his left side of face.  Fortunately his head CT scan and also CT scan showed no acute abnormalities.  Ibuprofen given for pain.  Patient found to be hypertensive with blood pressure in the 190 systolic.  Will recheck.  9:36 PM Blood pressure improves on recheck.  Labs are reassuring.  Will consult TTS for further psychiatric management.  Pt is medically cleared.    10:45 PM TTS have reached out to notify that pt meets inpt criteria and they will work on finding a place for him.    Fayrene Helperran, [name] Polhemus, PA-C 10/11/17 2246    Shaune PollackIsaacs, [place], MD 10/12/17 [phone]

## 2017-10-11 NOTE — ED Triage Notes (Addendum)
States fell last night and hit corner of chair with his face and left  left jaw and he started to hear voices then  after that and   He  lost him memory of how he got to er today, denies SI or HI states was out about a min

## 2017-10-11 NOTE — BH Assessment (Signed)
[facility] Assessment Progress Note   Per Nira ConnJason Berry, NP pt meets criteria for inpt treatment. TTS to seek placement. EDP Fayrene Helperran, [place], PA and the pt's nurse Caitlynn, RN have been notified of the recommendation.   Damon [name], MSW, LCSW Therapeutic Triage Specialist  [phone]

## 2017-10-11 NOTE — BH Assessment (Addendum)
Tele Assessment Note   Patient Name: Damon [name] MRN: [id number] Referring Physician: Fayrene Helperran, [place], PA-C Location of Patient: [facility] Location of Provider: Behavioral Health TTS Department  Damon [name] is an 51 y.o. male who presents to the ED voluntarily. Pt initially presents to the ED due to a fall however he advised ED staff that he began hearing voices after the fall. During the assessment, the pt reports he has been hearing voices for about a week. Pt states the voices were telling him to kill himself and he was looking for a wire in order to hang himself. Pt stated he passed out before he could find a wire. Pt states he has been depressed but does not disclose a significant trigger for his depression. Pt denies HI at current but states he has been violent in the past due to paranoid schizophrenia and believing people were trying to harm him. Pt has been hospitalized for inpt treatment in the past due to schizoaffective disorder and paranoid schizophrenia. [facility] and states he attends his appointments regularly. Pt states that he is currently divorced and he lives alone. Pt reports he has minimal supports. Due to increased risk factors for suicide, pt is recommended for inpt treatment.   Diagnosis: Schizophrenia   Past Medical History:  Past Medical History:  Diagnosis Date  . Depression   . Paranoid schizophrenia (HCC)     History reviewed. No pertinent surgical history.  Family History: No family history on file.  Social History:  reports that he has been smoking.  He has been smoking about 0.50 packs per day. His smokeless tobacco use includes chew. He reports that he drinks about 0.6 oz of alcohol per week. He reports that he does not use drugs.  Additional Social History:  Alcohol / Drug Use Pain Medications: See MAR Prescriptions: See MAR Over the Counter: See MAR History of alcohol / drug use?: No history of alcohol / drug abuse  CIWA:  CIWA-Ar BP: (!) 155/89 Pulse Rate: 79 COWS:    PATIENT STRENGTHS: (choose at least two) Average or above average intelligence Capable of independent living Communication skills Financial means General fund of knowledge Motivation for treatment/growth  Allergies:  Allergies  Allergen Reactions  . Spinach Hives    Home Medications:  (Not in a hospital admission)  OB/GYN Status:  No LMP for male patient.  General Assessment Data [facility] TTS Assessment: In system Is this a Tele or Face-to-Face Assessment?: Tele Assessment Is this an Initial Assessment or a Re-assessment for this encounter?: Initial Assessment Marital status: Divorced Is patient pregnant?: No Pregnancy Status: No Living Arrangements: Alone Can pt return to current living arrangement?: Yes Admission Status: Voluntary Is patient capable of signing voluntary admission?: Yes Referral Source: Self/Family/Friend Insurance type: Medicare     Crisis Care Plan Living Arrangements: Alone [facility]iatrist[facility]  Education Status Is patient currently in school?: No Highest grade of school patient has completed: some college  Contact person: self  Risk to self with the past 6 months Suicidal Ideation: Yes-Currently Present Has patient been a risk to self within the past 6 months prior to admission? : Yes Suicidal Intent: Yes-Currently Present Has patient had any suicidal intent within the past 6 months prior to admission? : Yes Is patient at risk for suicide?: Yes Suicidal Plan?: Yes-Currently Present Has patient had any suicidal plan within the past 6 months prior to admission? : Yes  Specify Current Suicidal Plan: pt states he was looking for a wire to hang himself with at home  Access to Means: No Specify Access to Suicidal Means: pt denies, states he could not find a wire  What has been your use of drugs/alcohol within the last 12 months?: denies  use  Previous Attempts/Gestures: No Triggers for Past Attempts: None known Intentional Self Injurious Behavior: None Family Suicide History: No Recent stressful life event(s): Other (Comment)(AVH) Persecutory voices/beliefs?: No Depression: Yes Depression Symptoms: Insomnia, Feeling worthless/self pity, Fatigue, Loss of interest in usual pleasures, Isolating Substance abuse history and/or treatment for substance abuse?: No Suicide prevention information given to non-admitted patients: Not applicable  Risk to Others within the past 6 months Homicidal Ideation: No Does patient have any lifetime risk of violence toward others beyond the six months prior to admission? : No Thoughts of Harm to Others: No Current Homicidal Intent: No Current Homicidal Plan: No Access to Homicidal Means: No History of harm to others?: Yes Assessment of Violence: In distant past Violent Behavior Description: pt admits that he was been violent with others in the past due to paranoid thoughts that they wanted to hurt him  Does patient have access to weapons?: No Criminal Charges Pending?: No Does patient have a court date: No Is patient on probation?: No  Psychosis Hallucinations: Auditory, Visual, With command Delusions: None noted  Mental Status Report Appearance/Hygiene: In scrubs, Unremarkable Eye Contact: Good Motor Activity: Freedom of movement Speech: Logical/coherent Level of Consciousness: Alert Mood: Depressed, Anxious Affect: Anxious, Depressed Anxiety Level: Moderate Thought Processes: Relevant, Coherent Judgement: Impaired Orientation: Person, Place, Time, Situation, Appropriate for developmental age Obsessive Compulsive Thoughts/Behaviors: None  Cognitive Functioning Concentration: Normal Memory: Remote Intact, Recent Intact IQ: Average Insight: Poor Impulse Control: Poor Appetite: Good Sleep: Decreased Total Hours of Sleep: 4 Vegetative Symptoms: None  ADLScreening [facility]  Assessment Services) Patient's cognitive ability adequate to safely complete daily activities?: Yes Patient able to express need for assistance with ADLs?: Yes Independently performs ADLs?: Yes (appropriate for developmental age)  Prior Inpatient Therapy Prior Inpatient Therapy: Yes Prior Therapy Dates: 2014 and mult other admits  Prior Therapy Facilty/Provider(s): [facility] Reason for Treatment: Schizophrenia   Prior Outpatient Therapy Prior Outpatient Therapy: Yes Prior Therapy Dates: current Prior Therapy Facilty/Provider(s): [facility] Reason for Treatment: med management  Does patient have an ACCT team?: No Does patient have Intensive In-House Services?  : No Does patient have [facility] services? : Yes Does patient have P4CC services?: No  ADL Screening (condition at time of admission) Patient's cognitive ability adequate to safely complete daily activities?: Yes Is the patient deaf or have difficulty hearing?: No Does the patient have difficulty seeing, even when wearing glasses/contacts?: No Does the patient have difficulty concentrating, remembering, or making decisions?: No Patient able to express need for assistance with ADLs?: Yes Does the patient have difficulty dressing or bathing?: No Independently performs ADLs?: Yes (appropriate for developmental age) Does the patient have difficulty walking or climbing stairs?: No Weakness of Legs: None Weakness of Arms/Hands: None  Home Assistive Devices/Equipment Home Assistive Devices/Equipment: Eyeglasses    Abuse/Neglect Assessment (Assessment to be complete while patient is alone) Abuse/Neglect Assessment Can Be Completed: Yes Physical Abuse: Denies Verbal Abuse: Denies Sexual Abuse: Denies Exploitation of patient/patient's resources: Denies Self-Neglect: Denies     Merchant navy officer (For Healthcare) Does Patient Have a Medical Advance Directive?: No Would patient like information on creating a medical advance directive?:  No - Patient declined    Additional Information 1:1  In Past 12 Months?: No CIRT Risk: No Elopement Risk: No Does patient have medical clearance?: Yes     Disposition: Per Nira ConnJason Berry, NP pt meets criteria for inpt treatment. TTS to seek placement. EDP Fayrene Helperran, [place], PA and the pt's nurse Caitlynn, RN have been notified of the recommendation.    Disposition Initial Assessment Completed for this Encounter: Yes Disposition of Patient: Inpatient treatment program Type of inpatient treatment program: Adult(per Nira ConnJason Berry, NP)  This service was provided via telemedicine using a 2-way, interactive audio and video technology.  Names of all persons participating in this telemedicine service and their role in this encounter. Name: Damon [name] Role: Patient   Name: Princess BruinsAquicha Manya Balash Role: TTS Counselor           Karolee Ohsquicha R Brittin Janik 10/11/2017 10:53 PM

## 2017-10-11 NOTE — ED Notes (Signed)
TTS at bedside. 

## 2017-10-11 NOTE — ED Notes (Signed)
All loose cables/cords removed from pt room. VS monitoring remains in room until pt medically cleared by EDP. Wires zip ties together. Urinal at the bedside for urine specimen.

## 2017-10-12 ENCOUNTER — Encounter (HOSPITAL_COMMUNITY): Payer: Self-pay | Admitting: General Practice

## 2017-10-12 ENCOUNTER — Inpatient Hospital Stay (HOSPITAL_COMMUNITY)
Admission: AD | Admit: 2017-10-12 | Discharge: 2017-10-14 | DRG: 885 | Disposition: A | Discharge status: Home / Self Care | Payer: Medicare Other | Source: Intra-hospital | Attending: Psychiatry | Admitting: Psychiatry

## 2017-10-12 ENCOUNTER — Other Ambulatory Visit: Payer: Self-pay

## 2017-10-12 DIAGNOSIS — G4700 Insomnia, unspecified: Secondary | ICD-10-CM | POA: Diagnosis present

## 2017-10-12 DIAGNOSIS — R440 Auditory hallucinations: Secondary | ICD-10-CM | POA: Diagnosis not present

## 2017-10-12 DIAGNOSIS — Z818 Family history of other mental and behavioral disorders: Secondary | ICD-10-CM | POA: Diagnosis not present

## 2017-10-12 DIAGNOSIS — F17210 Nicotine dependence, cigarettes, uncomplicated: Secondary | ICD-10-CM | POA: Diagnosis not present

## 2017-10-12 DIAGNOSIS — Z791 Long term (current) use of non-steroidal anti-inflammatories (NSAID): Secondary | ICD-10-CM | POA: Diagnosis not present

## 2017-10-12 DIAGNOSIS — F17220 Nicotine dependence, chewing tobacco, uncomplicated: Secondary | ICD-10-CM | POA: Diagnosis not present

## 2017-10-12 DIAGNOSIS — R51 Headache: Secondary | ICD-10-CM | POA: Diagnosis not present

## 2017-10-12 DIAGNOSIS — F329 Major depressive disorder, single episode, unspecified: Secondary | ICD-10-CM | POA: Diagnosis present

## 2017-10-12 DIAGNOSIS — F419 Anxiety disorder, unspecified: Secondary | ICD-10-CM | POA: Diagnosis not present

## 2017-10-12 DIAGNOSIS — F209 Schizophrenia, unspecified: Principal | ICD-10-CM | POA: Diagnosis present

## 2017-10-12 DIAGNOSIS — Z79899 Other long term (current) drug therapy: Secondary | ICD-10-CM | POA: Diagnosis not present

## 2017-10-12 DIAGNOSIS — S0990XA Unspecified injury of head, initial encounter: Secondary | ICD-10-CM | POA: Diagnosis not present

## 2017-10-12 DIAGNOSIS — R55 Syncope and collapse: Secondary | ICD-10-CM | POA: Diagnosis not present

## 2017-10-12 MED ORDER — HYDROXYZINE HCL 25 MG PO TABS: 25.0000 mg | ORAL_TABLET | Freq: Three times a day (TID) | ORAL | Status: DC | PRN

## 2017-10-12 MED ORDER — RISPERIDONE 2 MG PO TABS
2.0000 mg | ORAL_TABLET | Freq: Two times a day (BID) | ORAL | Status: DC
  Administered 2017-10-12 – 2017-10-14 (×5): 2 mg via ORAL
  Filled 2017-10-12 (×9): qty 1

## 2017-10-12 MED ORDER — RISPERIDONE 3 MG PO TABS
3.0000 mg | ORAL_TABLET | Freq: Every day | ORAL | Status: DC
  Filled 2017-10-12 (×2): qty 1

## 2017-10-12 MED ORDER — NICOTINE 7 MG/24HR TD PT24
7.0000 mg | MEDICATED_PATCH | Freq: Every day | TRANSDERMAL | Status: DC
  Administered 2017-10-12 – 2017-10-14 (×3): 7 mg via TRANSDERMAL
  Filled 2017-10-12 (×5): qty 1

## 2017-10-12 MED ORDER — ONDANSETRON HCL 4 MG PO TABS: 4.0000 mg | ORAL_TABLET | Freq: Three times a day (TID) | ORAL | Status: DC | PRN

## 2017-10-12 MED ORDER — BENZTROPINE MESYLATE 1 MG PO TABS
1.0000 mg | ORAL_TABLET | Freq: Two times a day (BID) | ORAL | Status: DC
  Administered 2017-10-12 – 2017-10-14 (×4): 1 mg via ORAL
  Filled 2017-10-12 (×6): qty 1

## 2017-10-12 MED ORDER — ACETAMINOPHEN 325 MG PO TABS: 650.0000 mg | ORAL_TABLET | Freq: Four times a day (QID) | ORAL | Status: DC | PRN

## 2017-10-12 MED ORDER — TRAZODONE HCL 50 MG PO TABS
50.0000 mg | ORAL_TABLET | Freq: Every evening | ORAL | Status: DC | PRN
  Administered 2017-10-12 – 2017-10-13 (×2): 50 mg via ORAL
  Filled 2017-10-12: qty 7
  Filled 2017-10-12 (×2): qty 1

## 2017-10-12 MED ORDER — IBUPROFEN 600 MG PO TABS
600.0000 mg | ORAL_TABLET | Freq: Three times a day (TID) | ORAL | Status: DC | PRN
  Administered 2017-10-12 – 2017-10-14 (×5): 600 mg via ORAL
  Filled 2017-10-12 (×5): qty 1

## 2017-10-12 MED ORDER — MAGNESIUM HYDROXIDE 400 MG/5ML PO SUSP: 30.0000 mL | Freq: Every day | ORAL | Status: DC | PRN

## 2017-10-12 MED ORDER — FLUOXETINE HCL 20 MG PO CAPS
20.0000 mg | ORAL_CAPSULE | Freq: Every day | ORAL | Status: DC
  Administered 2017-10-12 – 2017-10-14 (×3): 20 mg via ORAL
  Filled 2017-10-12 (×5): qty 1

## 2017-10-12 NOTE — Progress Notes (Signed)
Adult Psychoeducational Group Note  Date:  10/12/2017 Time:  8:43 PM  Group Topic/Focus:  Wrap-Up Group:   The focus of this group is to help patients review their daily goal of treatment and discuss progress on daily workbooks.  Participation Level:  Active  Participation Quality:  Appropriate  Affect:  Appropriate  Cognitive:  Appropriate  Insight: Appropriate  Engagement in Group:  Engaged  Modes of Intervention:  Discussion  Additional Comments:  Patient attended group and said that his day was a 8.5. Patient said he was excited because he is now able to come out of his room, eating more of his food and is sleeping a little better.   Damon [name] W [name] 10/12/2017, 8:43 PM

## 2017-10-12 NOTE — ED Notes (Signed)
Pt ambulatory to restroom

## 2017-10-12 NOTE — BHH Suicide Risk Assessment (Signed)
[facility]   Nursing information obtained from:    Demographic factors:    Current Mental Status:    Loss Factors:    Historical Factors:    Risk Reduction Factors:     Total Time spent with patient: 1 hour Principal Problem: Schizophrenia, unspecified (HCC) Diagnosis:   Patient Active Problem List   Diagnosis Date Noted  . Schizophrenia, unspecified (HCC) [F20.9] 10/12/2017  . Schizophrenia (HCC) [F20.9] 10/10/2012  . Paranoid schizophrenia (HCC) [F20.0]   . Depression [F32.9]   . Schizophrenia, paranoid type (HCC) [F20.0] 11/12/2011    Class: Acute   Subjective Data:  See H&P for full HPI Damon [name] is a 51 y/o M with history of schizophrenia who was voluntarily admitted with worsening CAH to kill himself after he experienced a fall. Pt was restarted on home medications and medically cleared. He reports that he continued to experience AH and VH since coming to the hospital, but his symptoms have improved significantly. He agrees to increase dose of risperdal and change to a twice-per-day dosing schedule.  Continued Clinical Symptoms:    The "Alcohol Use Disorders Identification Test", Guidelines for Use in Primary Care, Second Edition.  World Science writerHealth Organization [facility](WHO). Score between 0-7:  no or low risk or alcohol related problems. Score between 8-15:  moderate risk of alcohol related problems. Score between 16-19:  high risk of alcohol related problems. Score 20 or above:  warrants further diagnostic evaluation for alcohol dependence and treatment.   CLINICAL FACTORS:   Severe Anxiety and/or Agitation Schizophrenia:   Command hallucinatons Currently Psychotic   Musculoskeletal: Strength & Muscle Tone: within normal limits Gait & Station: normal Patient leans: N/A  Psychiatric Specialty Exam: Physical Exam  Nursing note and vitals reviewed.   Review of Systems  Constitutional: Negative for chills and fever.  Respiratory: Negative  for cough and shortness of breath.   Cardiovascular: Negative for chest pain.  Gastrointestinal: Negative for heartburn, nausea and vomiting.  Psychiatric/Behavioral: Positive for hallucinations. Negative for depression and suicidal ideas.    Blood pressure (!) 148/94, pulse 95, temperature 98.4 F (36.9 C), temperature source Oral, resp. rate 16, height 5\' 10"  (1.778 m), weight 81 kg (178 lb 8 oz).Body mass index is 25.61 kg/m.  General Appearance: Casual  Eye Contact:  Good  Speech:  Clear and Coherent and Normal Rate  Volume:  Normal  Mood:  Anxious and Euthymic  Affect:  Appropriate, Congruent and Flat  Thought Process:  Coherent and Goal Directed  Orientation:  NA  Thought Content:  Logical and Hallucinations: Auditory Visual  Suicidal Thoughts:  No  Homicidal Thoughts:  No  Memory:  Immediate;   Good Recent;   Good Remote;   Good  Judgement:  Fair  Insight:  Fair  Psychomotor Activity:  Normal  Concentration:  Concentration: Fair  Recall:  FiservFair  Fund of Knowledge:  Fair  Language:  Fair  Akathisia:  No  Handed:    AIMS (if indicated):     Assets:  Manufacturing systems engineerCommunication Skills Leisure Time Physical Health Resilience Social Support  ADL's:  Intact  Cognition:  WNL  Sleep:             COGNITIVE FEATURES THAT CONTRIBUTE TO RISK:  None    SUICIDE RISK:   Mild:  Suicidal ideation of limited frequency, intensity, duration, and specificity.  There are no identifiable plans, no associated intent, mild dysphoria and related symptoms, good self-control (both objective and subjective assessment), few other risk factors,  and identifiable protective factors, including available and accessible social support.  PLAN OF CARE:  - Admit to inpatient level of care - labs: hgbA1c, lipid panel, prolactin, TSH  - Schizophrenia  - Change risperdal 3mg  qhs to risperdal 2mg  BID  - Continue prozac 20mg  qDay - EPS  - Continue cogentin 1mg  BID - anxiety   - Continue atarax 25mg  TID  prn anxiety - insomnia  - continue trazodone 50mg  qhs prn insomnia -Encourage participation in groups and the therapeutic milieu - Discharge planning will be ongoing   I certify that inpatient services furnished can reasonably be expected to improve the patient's condition.   Micheal Likenshristopher T Zohra Clavel, MD 10/12/2017, 3:32 PM
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
## 2017-10-12 NOTE — H&P (Signed)
Psychiatric Admission Assessment Adult  Patient Identification: Damon [name] MRN:  [id number] Date of Evaluation:  10/12/2017 Chief Complaint:  schizophrenia Principal Diagnosis: Schizophrenia, unspecified ([place]) Diagnosis:   Patient Active Problem List   Diagnosis Date Noted  . Schizophrenia, unspecified ([place]) [F20.9] 10/12/2017  . Schizophrenia ([place]) [F20.9] 10/10/2012  . Paranoid schizophrenia ([place]) [F20.0]   . Depression [F32.9]   . Schizophrenia, paranoid type ([place]) [F20.0] 11/12/2011    Class: Acute   History of Present Illness:  Damon [name] is a 51 y/o M with history of schizophrenia who was admitted voluntarily after he presented to the ED with worsening symptoms of AH in the context of recent fall and hitting his head. Pt was medically cleared at ED and transferred to [facility]. Pt reports that he had been experiencing AH in the recent weeks, but after he had a fall with a brief episode of syncope, he notes that AH were much worse. He began to have CAH which told him to kill himself, and pt had even started to look for a wire with which to hang himself. At that point, pt decided to go to ED for more help.  Upon interview, pt explains his reasons for presenting, stating, "I had a nasty fall and then everything started getting wacky." Pt reports that he had CAH to hurt himself and VH of seeing "images of the devil" which have continued since he has been at the hospital, most recently occurring in the ED. Pt denies SI/HI since he has been at the hospital, and he reports that [facility] have diminished as well. He is feeling better at time of evaluation, and asks about his projected length of stay. Pt reports that prior to coming to the hospital he had good medication adherence. He denied recent substance use aside from tobacco 0.5ppd. Pt reports his sleep has been good recently. He reports some increased depression in the past few weeks but he is unable to identify a trigger. He denies other  symptoms of mania, OCD, and PTSD.  Discussed with patient about treatment options. He reports that he was changed from zoloft to prozac in the recent weeks, and he has been tolerating that well without difficulty. He notes that he has previously been stable on dose of risperdal 53m qhs for several months. We discussed option of increasing dose of risperdal and splitting daily total into two divided doses, and pt was in agreement. He had no further questions, comments, or concerns.  Associated Signs/Symptoms: Depression Symptoms:  depressed mood, suicidal thoughts with specific plan, anxiety, (Hypo) Manic Symptoms:  Hallucinations, Anxiety Symptoms:  Excessive Worry, Psychotic Symptoms:  Hallucinations: Auditory Command:  to kill self Visual PTSD Symptoms: NA Total Time spent with patient: 1 hour  Past Psychiatric History:  - dx of schizophrenia - about 10-15 [facility] 2017 - denies hx of SA  Is the patient at risk to self? Yes.    Has the patient been a risk to self in the past 6 months? Yes.    Has the patient been a risk to self within the distant past? Yes.    Is the patient a risk to others? No.  Has the patient been a risk to others in the past 6 months? No.  Has the patient been a risk to others within the distant past? No.   Prior Inpatient Therapy:   Prior Outpatient Therapy:  Alcohol Screening: 1. How often do you have a drink containing alcohol?: Never 2. How many drinks containing alcohol do you have on a typical day when you are drinking?: 1 or 2 3. How often do you have six or more drinks on one occasion?: Never AUDIT-C Score: 0 9. Have you or someone else been injured as a result of your drinking?: No 10. Has a relative or friend or a doctor or another health worker been concerned about your drinking or suggested you cut down?: No Intervention/Follow-up:  AUDIT Score <7 follow-up not indicated Substance Abuse History in the last 12 months:  No. Consequences of Substance Abuse: NA Previous Psychotropic Medications: Yes  Psychological Evaluations: Yes  Past Medical History:  Past Medical History:  Diagnosis Date  . Depression   . Paranoid schizophrenia ([place])    History reviewed. No pertinent surgical history. Family History: History reviewed. No pertinent family history. Family Psychiatric  History: hx of schizophrenia in brother, sister, father, and mother as per patient report.  Tobacco Screening: Have you used any form of tobacco in the last 30 days? (Cigarettes, Smokeless Tobacco, Cigars, and/or Pipes): Yes Tobacco use, Select all that apply: 4 or less cigarettes per day Are you interested in Tobacco Cessation Medications?: Yes, will notify MD for an order Counseled patient on smoking cessation including recognizing danger situations, developing coping skills and basic information about quitting provided: Refused/Declined practical counseling Social History:  Social History   Substance and Sexual Activity  Alcohol Use Yes  . Alcohol/week: 0.6 oz  . Types: 1 Cans of beer per week   Comment: one can of beer weekly     Social History   Substance and Sexual Activity  Drug Use No    Additional Social History: Marital status: Single Are you sexually active?: No What is your sexual orientation?: Heterosexual  Has your sexual activity been affected by drugs, alcohol, medication, or emotional stress?: No  Does patient have children?: No                         Allergies:   Allergies  Allergen Reactions  . Spinach Hives   Lab Results:  Results for orders placed or performed during the hospital encounter of 10/11/17 (from the past 48 hour(s))  Comprehensive metabolic panel     Status: None   Collection Time: 10/11/17  3:05 PM  Result Value Ref Range   Sodium 138 135 - 145 mmol/L   Potassium 3.8 3.5 - 5.1 mmol/L    Chloride 105 101 - 111 mmol/L   CO2 25 22 - 32 mmol/L   Glucose, Bld 89 65 - 99 mg/dL   BUN 14 6 - 20 mg/dL   Creatinine, Ser 1.07 0.61 - 1.24 mg/dL   Calcium 9.4 8.9 - 10.3 mg/dL   Total Protein 7.7 6.5 - 8.1 g/dL   Albumin 4.2 3.5 - 5.0 g/dL   AST 25 15 - 41 U/L   ALT 27 17 - 63 U/L   Alkaline Phosphatase 62 38 - 126 U/L   Total Bilirubin 0.4 0.3 - 1.2 mg/dL   GFR calc non Af Amer >60 >60 mL/min   GFR calc Af Amer >60 >60 mL/min    Comment: (NOTE) The eGFR has been calculated using the CKD EPI equation. This calculation has not been validated in all clinical situations. eGFR's persistently <60 mL/min signify possible Chronic Kidney Disease.    Anion gap 8 5 - 15  cbc  Status: None   Collection Time: 10/11/17  3:05 PM  Result Value Ref Range   WBC 7.1 4.0 - 10.5 K/uL   RBC 4.63 4.22 - 5.81 MIL/uL   Hemoglobin 13.9 13.0 - 17.0 g/dL   HCT 41.2 39.0 - 52.0 %   MCV 89.0 78.0 - 100.0 fL   MCH 30.0 26.0 - 34.0 pg   MCHC 33.7 30.0 - 36.0 g/dL   RDW 13.4 11.5 - 15.5 %   Platelets 323 150 - 400 K/uL  Rapid urine drug screen (hospital performed)     Status: None   Collection Time: 10/11/17  9:56 PM  Result Value Ref Range   Opiates NONE DETECTED NONE DETECTED   Cocaine NONE DETECTED NONE DETECTED   Benzodiazepines NONE DETECTED NONE DETECTED   Amphetamines NONE DETECTED NONE DETECTED   Tetrahydrocannabinol NONE DETECTED NONE DETECTED   Barbiturates NONE DETECTED NONE DETECTED    Comment:        DRUG SCREEN FOR MEDICAL PURPOSES ONLY.  IF CONFIRMATION IS NEEDED FOR ANY PURPOSE, NOTIFY LAB WITHIN 5 DAYS.        LOWEST DETECTABLE LIMITS FOR URINE DRUG SCREEN Drug Class       Cutoff (ng/mL) Amphetamine      1000 Barbiturate      200 Benzodiazepine   119 Tricyclics       417 Opiates          300 Cocaine          300 THC              50   Ethanol     Status: None   Collection Time: 10/11/17 11:16 PM  Result Value Ref Range   Alcohol, Ethyl (B) <10 <10 mg/dL     Comment:        LOWEST DETECTABLE LIMIT FOR SERUM ALCOHOL IS 10 mg/dL FOR MEDICAL PURPOSES ONLY     Blood Alcohol level:  Lab Results  Component Value Date   ETH <10 10/11/2017   ETH <11 40/81/4481    Metabolic Disorder Labs:  Lab Results  Component Value Date   HGBA1C 5.9 (H) 10/09/2012   MPG 123 (H) 10/09/2012   MPG 117 12/12/2009   No results found for: PROLACTIN Lab Results  Component Value Date   CHOL 188 10/09/2012   TRIG 48 10/09/2012   HDL 89 10/09/2012   CHOLHDL 2.1 10/09/2012   VLDL 10 10/09/2012   LDLCALC 89 10/09/2012   LDLCALC  02/14/2010    79        Total Cholesterol/HDL:CHD Risk Coronary Heart Disease Risk Table                     Men   Women  1/2 Average Risk   3.4   3.3  Average Risk       5.0   4.4  2 X Average Risk   9.6   7.1  3 X Average Risk  23.4   11.0        Use the calculated Patient Ratio above and the CHD Risk Table to determine the patient's CHD Risk.        ATP III CLASSIFICATION (LDL):  <100     mg/dL   Optimal  100-129  mg/dL   Near or Above                    Optimal  130-159  mg/dL   Borderline  160-189  mg/dL  High  >190     mg/dL   Very High    Current Medications: Current Facility-Administered Medications  Medication Dose Route Frequency Provider Last Rate Last Dose  . acetaminophen (TYLENOL) tablet 650 mg  650 mg Oral Q6H PRN Lindon Romp A, NP      . benztropine (COGENTIN) tablet 1 mg  1 mg Oral BID Lindon Romp A, NP      . FLUoxetine (PROZAC) capsule 20 mg  20 mg Oral Daily Pennelope Bracken, MD   20 mg at 10/12/17 1456  . hydrOXYzine (ATARAX/VISTARIL) tablet 25 mg  25 mg Oral TID PRN Rozetta Nunnery, NP      . ibuprofen (ADVIL,MOTRIN) tablet 600 mg  600 mg Oral Q8H PRN Lindon Romp A, NP   600 mg at 10/12/17 1339  . magnesium hydroxide (MILK OF MAGNESIA) suspension 30 mL  30 mL Oral Daily PRN Lindon Romp A, NP      . nicotine (NICODERM CQ - dosed in mg/24 hr) patch 7 mg  7 mg Transdermal Daily Pennelope Bracken, MD   7 mg at 10/12/17 1339  . ondansetron (ZOFRAN) tablet 4 mg  4 mg Oral Q8H PRN Lindon Romp A, NP      . risperiDONE (RISPERDAL) tablet 2 mg  2 mg Oral BID Pennelope Bracken, MD   2 mg at 10/12/17 1456  . traZODone (DESYREL) tablet 50 mg  50 mg Oral QHS PRN Rozetta Nunnery, NP       PTA Medications: Medications Prior to Admission  Medication Sig Dispense Refill Last Dose  . acetaminophen (TYLENOL) 500 MG tablet Take 500 mg by mouth every 6 (six) hours as needed for mild pain.   prn  . benztropine (COGENTIN) 1 MG tablet Take 1 tablet (1 mg total) by mouth 2 (two) times daily. For EPS 60 tablet 0 10/11/2017 at Unknown time  . diclofenac (CATAFLAM) 50 MG tablet Take 1 tablet (50 mg total) by mouth 3 (three) times daily. One tablet TID with food prn pain. (Patient not taking: Reported on 11/14/2014) 21 tablet 0 Completed Course at Unknown time  . FLUoxetine (PROZAC) 20 MG capsule Take 1 capsule (20 mg total) by mouth daily. For depression. (Patient not taking: Reported on 10/11/2017) 30 capsule 0 Not Taking at Unknown time  . naproxen (NAPROSYN) 500 MG tablet Take 1 tablet (500 mg total) by mouth 2 (two) times daily. 30 tablet 0 10/11/2017 at Unknown time  . risperiDONE (RISPERDAL) 3 MG tablet Take 1 tablet (3 mg total) by mouth at bedtime. For psychosis. 30 tablet 0 10/10/2017 at Unknown time  . traMADol (ULTRAM) 50 MG tablet Take 1 tablet (50 mg total) by mouth every 6 (six) hours as needed. (Patient not taking: Reported on 10/11/2017) 15 tablet 0 Not Taking at Unknown time    Musculoskeletal: Strength & Muscle Tone: within normal limits Gait & Station: normal Patient leans: N/A  Psychiatric Specialty Exam: Physical Exam  Nursing note and vitals reviewed.   Review of Systems  Constitutional: Negative for chills and fever.  Respiratory: Negative for cough and shortness of breath.   Gastrointestinal: Negative for abdominal pain, heartburn, nausea and vomiting.   Psychiatric/Behavioral: Positive for hallucinations. Negative for depression and suicidal ideas.    Blood pressure (!) 148/94, pulse 95, temperature 98.4 F (36.9 C), temperature source Oral, resp. rate 16, height _0  (1.778 m), weight 81 kg (178 lb 8 oz).Body mass index is 25.61 kg/m.  General Appearance: Casual and  Fairly Groomed  Eye Contact:  Good  Speech:  Clear and Coherent and Normal Rate  Volume:  Normal  Mood:  Anxious and Euthymic  Affect:  Appropriate, Congruent and Flat  Thought Process:  Coherent and Goal Directed  Orientation:  NA  Thought Content:  Logical and Hallucinations: Auditory Visual  Suicidal Thoughts:  No  Homicidal Thoughts:  No  Memory:  Immediate;   Good Recent;   Good Remote;   Good  Judgement:  Fair  Insight:  Fair  Psychomotor Activity:  Normal  Concentration:  Concentration: Fair  Recall:  [employer] of Knowledge:  Fair  Language:  Fair  Akathisia:  No  Handed:    AIMS (if indicated):     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 Time Physical Health Resilience Social Support  ADL's:  Intact  Cognition:  WNL  Sleep:       Treatment Plan Summary: Daily contact with patient to assess and evaluate symptoms and progress in treatment and Medication management  Observation Level/Precautions:  15 minute checks  Laboratory:  CBC HbAIC HCG UDS  Psychotherapy:  Encourage participation in groups and the therapeutic milieu  Medications: change risperdal 10m qhs to risperdal 221mBID, continue trazodone 5022mhs, continue prozac 83m76may    Consultations:    Discharge Concerns:    Estimated LOS:3-5 days  Other:     Physician Treatment Plan for Primary Diagnosis: Schizophrenia, unspecified (HCC)[place] Term Goal(s): Improvement in symptoms so as ready for discharge  Short Term Goals: Ability to identify triggers associated with substance abuse/mental health issues will improve  Physician Treatment Plan for Secondary Diagnosis: Principal  Problem:   Schizophrenia, unspecified (HCC)[place] Term Goal(s): Improvement in symptoms so as ready for discharge  Short Term Goals: Ability to identify and develop effective coping behaviors will improve  I certify that inpatient services furnished can reasonably be expected to improve the patient's condition.    ChriPennelope Bracken 11/28/20183:19 PM

## 2017-10-12 NOTE — Progress Notes (Signed)
Patient ID: Damon [name], male   DOB: [birth year], 51 y.o.   MRN: [id number] PER STATE REGULATIONS 482.30  THIS CHART WAS REVIEWED FOR MEDICAL NECESSITY WITH RESPECT TO THE PATIENT'S ADMISSION/DURATION OF STAY.  NEXT REVIEW DATE:10/16/17  Loura HaltBARBARA Wynema Garoutte, RN, BSN CASE MANAGER

## 2017-10-12 NOTE — Tx Team (Signed)
Initial Treatment Plan 10/12/2017 12:41 PM Damon [name] JYN:[id number]    PATIENT STRESSORS: Health problems Medication change or noncompliance   PATIENT STRENGTHS: Capable of independent living General fund of knowledge   PATIENT IDENTIFIED PROBLEMS: Syncopal Episode     Depression, decreased concentration     Patient does not identify any goals at this time.              DISCHARGE CRITERIA:  Improved stabilization in mood, thinking, and/or behavior Verbal commitment to aftercare and medication compliance  PRELIMINARY DISCHARGE PLAN: Outpatient therapy Return to previous living arrangement  PATIENT/FAMILY INVOLVEMENT: This treatment plan has been presented to and reviewed with the patient, Damon AlbrightElijah L Puryear.  The patient has been given the opportunity to ask questions and make suggestions.  Larina Earthlyopson, Hannia Matchett E, RN 10/12/2017, 12:41 PM

## 2017-10-12 NOTE — ED Notes (Signed)
[facility] called for transport.

## 2017-10-12 NOTE — [facility] Suicide Risk Assessment (Signed)
[facility] INPATIENT:  Family/Significant Other Suicide Prevention Education  Suicide Prevention Education:  Patient Refusal for Family/Significant Other Suicide Prevention Education: The patient Damon [name] has refused to provide written consent for family/significant other to be provided Family/Significant Other Suicide Prevention Education during admission and/or prior to discharge.  Physician notified.  Metro Kungngel M Yuchen Fedor 10/12/2017, 1:01 PM

## 2017-10-12 NOTE — Progress Notes (Signed)
[facility] 07[facility]30. Attending provider is Dr. Jama Flavorsobos, MD. Call to report [phone]. Pt's nurse Suezanne Cheshirehervenka, Caitlynn J, RN has been notified of the acceptance.  [name], MSW, LCSW Therapeutic Triage Specialist  [phone]

## 2017-10-12 NOTE — ED Notes (Signed)
Pt's 4 belonging bags are in [employer]. Pt valuables given to security.

## 2017-10-12 NOTE — Tx Team (Signed)
Interdisciplinary Treatment and Diagnostic Plan Update  10/12/2017 Time of Session: 1:01 PM  Damon Albrightlijah L [name] MRN: [id number]  Principal Diagnosis: Schizophrenia, unspecified   Secondary Diagnoses: Active Problems:   Schizophrenia, unspecified (HCC)   Current Medications:  Current Facility-Administered Medications  Medication Dose Route Frequency Provider Last Rate Last Dose  . acetaminophen (TYLENOL) tablet 650 mg  650 mg Oral Q6H PRN Nira ConnBerry, Jason A, NP      . benztropine (COGENTIN) tablet 1 mg  1 mg Oral BID Nira ConnBerry, Jason A, NP      . hydrOXYzine (ATARAX/VISTARIL) tablet 25 mg  25 mg Oral TID PRN Jackelyn PolingBerry, Jason A, NP      . ibuprofen (ADVIL,MOTRIN) tablet 600 mg  600 mg Oral Q8H PRN Nira ConnBerry, Jason A, NP      . magnesium hydroxide (MILK OF MAGNESIA) suspension 30 mL  30 mL Oral Daily PRN Nira ConnBerry, Jason A, NP      . ondansetron (ZOFRAN) tablet 4 mg  4 mg Oral Q8H PRN Nira ConnBerry, Jason A, NP      . risperiDONE (RISPERDAL) tablet 3 mg  3 mg Oral QHS Nira ConnBerry, Jason A, NP      . traZODone (DESYREL) tablet 50 mg  50 mg Oral QHS PRN Jackelyn PolingBerry, Jason A, NP        PTA Medications: Medications Prior to Admission  Medication Sig Dispense Refill Last Dose  . acetaminophen (TYLENOL) 500 MG tablet Take 500 mg by mouth every 6 (six) hours as needed for mild pain.   prn  . benztropine (COGENTIN) 1 MG tablet Take 1 tablet (1 mg total) by mouth 2 (two) times daily. For EPS 60 tablet 0 10/11/2017 at Unknown time  . diclofenac (CATAFLAM) 50 MG tablet Take 1 tablet (50 mg total) by mouth 3 (three) times daily. One tablet TID with food prn pain. (Patient not taking: Reported on 11/14/2014) 21 tablet 0 Completed Course at Unknown time  . FLUoxetine (PROZAC) 20 MG capsule Take 1 capsule (20 mg total) by mouth daily. For depression. (Patient not taking: Reported on 10/11/2017) 30 capsule 0 Not Taking at Unknown time  . naproxen (NAPROSYN) 500 MG tablet Take 1 tablet (500 mg total) by mouth 2 (two) times daily. 30 tablet 0  10/11/2017 at Unknown time  . risperiDONE (RISPERDAL) 3 MG tablet Take 1 tablet (3 mg total) by mouth at bedtime. For psychosis. 30 tablet 0 10/10/2017 at Unknown time  . traMADol (ULTRAM) 50 MG tablet Take 1 tablet (50 mg total) by mouth every 6 (six) hours as needed. (Patient not taking: Reported on 10/11/2017) 15 tablet 0 Not Taking at Unknown time    Treatment Modalities: Medication Management, Group therapy, Case management,  1 to 1 session with clinician, Psychoeducation, Recreational therapy.  Patient Stressors: Health problems Medication change or noncompliance  Patient Strengths: Capable of independent living General fund of knowledge   Physician Treatment Plan for Primary Diagnosis: Schizophrenia, unspecified  Long Term Goal(s): Improvement in symptoms so as ready for discharge  Short Term Goals:    Medication Management: Evaluate patient's response, side effects, and tolerance of medication regimen.  Therapeutic Interventions: 1 to 1 sessions, Unit Group sessions and Medication administration.  Evaluation of Outcomes: Progressing  Physician Treatment Plan for Secondary Diagnosis: Active Problems:   Schizophrenia, unspecified (HCC)  Long Term Goal(s): Improvement in symptoms so as ready for discharge  Short Term Goals:    Medication Management: Evaluate patient's response, side effects, and tolerance of medication regimen.  Therapeutic Interventions: 1 to  1 sessions, Unit Group sessions and Medication administration.  Evaluation of Outcomes: Progressing   RN Treatment Plan for Primary Diagnosis: Schizophrenia, unspecified  Long Term Goal(s): Knowledge of disease and therapeutic regimen to maintain health will improve  Short Term Goals: Ability to participate in decision making will improve, Ability to identify and develop effective coping behaviors will improve and Compliance with prescribed medications will improve  Medication Management: RN will administer  medications as ordered by provider, will assess and evaluate patient's response and provide education to patient for prescribed medication. RN will report any adverse and/or side effects to prescribing provider.  Therapeutic Interventions: 1 on 1 counseling sessions, Psychoeducation, Medication administration, Evaluate responses to treatment, Monitor vital signs and CBGs as ordered, Perform/monitor CIWA, COWS, AIMS and Fall Risk screenings as ordered, Perform wound care treatments as ordered.  Evaluation of Outcomes: Progressing   LCSW Treatment Plan for Primary Diagnosis: Schizophrenia, unspecified  Long Term Goal(s): Safe transition to appropriate next level of care at discharge, Engage patient in therapeutic group addressing interpersonal concerns.  Short Term Goals: Engage patient in aftercare planning with referrals and resources, Facilitate acceptance of mental health diagnosis and concerns, Identify triggers associated with mental health/substance abuse issues and Increase skills for wellness and recovery  Therapeutic Interventions: Assess for all discharge needs, 1 to 1 time with Social worker, Explore available resources and support systems, Assess for adequacy in community support network, Educate family and significant other(s) on suicide prevention, Complete Psychosocial Assessment, Interpersonal group therapy.  Evaluation of Outcomes: Progressing   Progress in Treatment: Attending groups: Yes Participating in groups: Yes Taking medication as prescribed: Yes Toleration of medication: Yes, no side effects reported at this time Family/Significant other contact made: No  Patient understands diagnosis: No, limited insight  Discussing patient identified problems/goals with staff: Yes Medical problems stabilized or resolved: Yes Denies suicidal/homicidal ideation: Yes Issues/concerns per patient self-inventory: None Other: N/A  New problem(s) identified: None identified at this  time.   New Short Term/Long Term Goal(s): "Getting back on my medications".    Discharge Plan or Barriers: Upon discharge pt will return home with his brother   Reason for Continuation of Hospitalization: Depression Hallucinations Medication stabilization    Estimated Length of Stay: 10/17/17  Attendees: Patient: Damon [name]  10/12/2017  1:01 PM  Physician: Jolyne Loahristopher Rainville, MD 10/12/2017  1:01 PM  Nursing: Estella HuskElizabeth Awofabeju, RN 10/12/2017  1:01 PM  RN Care Manager: Onnie BoerJennifer Clark, RN 10/12/2017  1:01 PM  Social Worker: Richelle Itood North, LCSW; Melba CoonAngel Careem Yasui, Social Work Intern 10/12/2017  1:01 PM  Recreational Therapist: Caroll RancherMarjette Lindsay, LRT 10/12/2017  1:01 PM  Other: Tomasita Morrowelora Sutton, P4CC 10/12/2017  1:01 PM  Other:  10/12/2017  1:01 PM  Other: 10/12/2017  1:01 PM    Scribe for Treatment Team: Aram BeechamAngel M Ryler Laskowski, Student-Social Work 10/12/2017 1:01 PM

## 2017-10-12 NOTE — ED Notes (Signed)
Voluntary Admission & Consent for treatment and Consent to release information [facility]. Copy placed in medical records.

## 2017-10-12 NOTE — Progress Notes (Signed)
Patient ID: Damon [name], male   DOB: [birth year], 51 y.o.   MRN: [id number]  Admission Note:   Damon [name] is a 51 year old male [facility] after presenting to the ED with reports of a syncopal episode. Patient reports he hit his head and began to experience auditory hallucinations. Patient has a history of depression and paranoia schizophrenia and [facility]. He reports that his left jaw and face area are painful and he reports Ibuprofen has been decreasing this pain. He currently denies SI and HI but reports that he was SI when presenting to the ED. He reports the voices were telling him to kill himself. He reports that he "could not come up with a plan." At this time he does report auditory hallucinations in the form of "mumbling." They are not command in nature at this time. He reports that he does have a history of visual hallucinations, his last episode was about three weeks ago. Patient presents as depressed throughout the admission process but is cooperative and appropriate during. He reports that at times he does experience some lightheadedness which may be related to his elevated BP. Patient reports that this may be why he experienced a syncopal episode. Patient is a high fall risk due to syncopal episode. He is oriented to the unit and given some hygiene products and a snack. Q15 minute safety checks were initiated and are to be maintained.

## 2017-10-13 LAB — LIPID PANEL
CHOL/HDL RATIO: 2.7 ratio
CHOLESTEROL: 187 mg/dL (ref 0–200)
HDL: 69 mg/dL (ref >40)
LDL CALC: 104 mg/dL — AB (ref 0–99)
TRIGLYCERIDES: 70 mg/dL (ref <150)
VLDL: 14 mg/dL (ref 0–40)

## 2017-10-13 LAB — HEMOGLOBIN A1C
Hgb A1c MFr Bld: 6 % — ABNORMAL HIGH (ref 4.8–5.6)
Mean Plasma Glucose: 125.5 mg/dL

## 2017-10-13 LAB — TSH: TSH: 2.138 u[IU]/mL (ref 0.350–4.500)

## 2017-10-13 NOTE — Progress Notes (Signed)
Patient ID: Damon [name], male   DOB: 09-04-1966, 51 y.o.   MRN: [id number]  Pt currently presents with a flat affect and guarded behavior. Pt states "I am just tired." Pt reports good sleep with current medication regimen. Pt reports some confusion about medications. Asks Clinical research associatewriter what medications "look like." Described the look of the medication but further discussion led to hen told confusion for the patient.   Pt provided with medications per providers orders. Pt's labs and vitals were monitored throughout the night. Pt given a 1:1 about emotional and mental status. Pt supported and encouraged to express concerns and questions. Pt educated on medications, needs reinforcement.   Pt's safety ensured with 15 minute and environmental checks. Pt currently denies SI/HI and A/V hallucinations. Pt verbally agrees to seek staff if SI/HI or A/VH occurs and to consult with staff before acting on any harmful thoughts. Pt adheres to medication regimen. Will continue POC.

## 2017-10-13 NOTE — Progress Notes (Signed)
Patient ID: Damon [name], male   DOB: Aug 17, 1966, 51 y.o.   MRN: [id number]  Writer made MD Rainville aware of patient's elevated BP this morning during treatment team at 0830.

## 2017-10-13 NOTE — Progress Notes (Signed)
D: Pt appeared to be somewhat apprehensive initially, however, warmed quickly. When asked the circumstances surrounding his adm pt informed the writer that he fell and hit the left side of his head. Stated, "everything got out of whack after that". Stated, "wondering why the voices getting louder". Pt asked if "congentin makes you sleepy".  Pt has no other questions or concerns.    A:  Support and encouragement was offered. 15 min checks continued for safety.  R: Pt remains safe.

## 2017-10-13 NOTE — Progress Notes (Signed)
Recreation Therapy Notes  Date: 10/13/17 Time: 1000 Location: 500 Hall Dayroom  Group Topic: Wellness  Goal Area(s) Addresses:  Patient will define components of whole wellness. Patient will verbalize benefit of whole wellness.  Behavioral Response: Engaged  Intervention:  Social workerubber Disc  Activity: [place] [employer].  Each patient was given a rubber disc, with one extra disc for the group.  Patients were to use the discs to step on to get from one end of the hall to the other and back.  If anyone stepped off their disc, the group would have to start over from the beginning.   Education: Wellness, Building control surveyorDischarge Planning.   Education Outcome: Acknowledges education/In group clarification offered/Needs additional education.   Clinical Observations/Feedback: Pt worked well with his peers.  Pt stated they used teamwork for the activity.  Pt helped figure out how far from the wall the discs needed to be placed.  Pt explained during processing, peer pressure sometimes keeps you from making the right decisions or using the skills you know will help you complete a task.   Caroll RancherMarjette Shemekia Patane, LRT/CTRS       Caroll RancherLindsay, Cordie Buening A 10/13/2017 12:06 PM

## 2017-10-13 NOTE — Progress Notes (Signed)
[facility]  10/13/2017 3:35 PM Damon [name]  MRN:  [id number] Subjective:   Damon [name] is a 51 y/o M with history of schizophrenia who was admitted with worsening CAH to kill himself which worsened after he had an episode of syncope and a fall at home. Pt was restarted on home medication of risperdal at higher dose (and changed to BID dosing). Today upon evaluation, pt reports that he is doing well overall and symptoms of AH have improved significantly. He explains, "I can't understand what the voices are saying any more." He reports intensity of AH as "2/10." He denies VH/SI/HI. He is sleeping well. His appetite is good. He is tolerating his medications without difficulty or side effects. He is future oriented about returning home and preparing for the holidays. He is planning on follow up with [facility]. We discussed potential discharge in the AM if pt continues to have improvement of his symptoms of psychosis,and pt was in agreement with that plan. He had no further questions, comments, or concerns.  Principal Problem: Schizophrenia, unspecified (HCC) Diagnosis:   Patient Active Problem List   Diagnosis Date Noted  . Schizophrenia, unspecified (HCC) [F20.9] 10/12/2017  . Schizophrenia (HCC) [F20.9] 10/10/2012  . Paranoid schizophrenia (HCC) [F20.0]   . Depression [F32.9]   . Schizophrenia, paranoid type (HCC) [F20.0] 11/12/2011    Class: Acute   Total Time spent with patient: 30 minutes  Past Psychiatric History: see H&P  Past Medical History:  Past Medical History:  Diagnosis Date  . Depression   . Paranoid schizophrenia (HCC)    History reviewed. No pertinent surgical history. Family History: History reviewed. No pertinent family history. Family Psychiatric  History: see H&P Social History:  Social History   Substance and Sexual Activity  Alcohol Use Yes  . Alcohol/week: 0.6 oz  . Types: 1 Cans of beer per week   Comment: one can of beer weekly     Social  History   Substance and Sexual Activity  Drug Use No    Social History   Socioeconomic History  . Marital status: Single    Spouse name: None  . Number of children: None  . Years of education: None  . Highest education level: None  Social Needs  . Financial resource strain: None  . Food insecurity - worry: None  . Food insecurity - inability: None  . Transportation needs - medical: None  . Transportation needs - non-medical: None  Occupational History  . None  Tobacco Use  . Smoking status: Current Every Day Smoker    Packs/day: 0.25    Types: Cigarettes  . Smokeless tobacco: Current User    Types: Chew  Substance and Sexual Activity  . Alcohol use: Yes    Alcohol/week: 0.6 oz    Types: 1 Cans of beer per week    Comment: one can of beer weekly  . Drug use: No  . Sexual activity: Yes    Birth control/protection: Condom  Other Topics Concern  . None  Social History Narrative  . None   Additional Social History:                         Sleep: Good  Appetite:  Good  Current Medications: Current Facility-Administered Medications  Medication Dose Route Frequency Provider Last Rate Last Dose  . acetaminophen (TYLENOL) tablet 650 mg  650 mg Oral Q6H PRN Nira ConnBerry, Jason A, NP      . benztropine (  COGENTIN) tablet 1 mg  1 mg Oral BID Nira Conn A, NP   1 mg at 10/13/17 0741  . FLUoxetine (PROZAC) capsule 20 mg  20 mg Oral Daily Jolyne Loa T, MD   20 mg at 10/13/17 0741  . hydrOXYzine (ATARAX/VISTARIL) tablet 25 mg  25 mg Oral TID PRN Jackelyn Poling, NP      . ibuprofen (ADVIL,MOTRIN) tablet 600 mg  600 mg Oral Q8H PRN Nira Conn A, NP   600 mg at 10/13/17 1400  . magnesium hydroxide (MILK OF MAGNESIA) suspension 30 mL  30 mL Oral Daily PRN Nira Conn A, NP      . nicotine (NICODERM CQ - dosed in mg/24 hr) patch 7 mg  7 mg Transdermal Daily Micheal Likens, MD   7 mg at 10/13/17 0742  . ondansetron (ZOFRAN) tablet 4 mg  4 mg Oral Q8H PRN  Nira Conn A, NP      . risperiDONE (RISPERDAL) tablet 2 mg  2 mg Oral BID Micheal Likens, MD   2 mg at 10/13/17 0741  . traZODone (DESYREL) tablet 50 mg  50 mg Oral QHS PRN Jackelyn Poling, NP   50 mg at 10/12/17 2253    Lab Results:  Results for orders placed or performed during the hospital encounter of 10/12/17 (from the past 48 hour(s))  Hemoglobin A1c     Status: Abnormal   Collection Time: 10/13/17  6:45 AM  Result Value Ref Range   Hgb A1c MFr Bld 6.0 (H) 4.8 - 5.6 %    Comment: (NOTE) Pre diabetes:          5.7%-6.4% Diabetes:              >6.4% Glycemic control for   <7.0% adults with diabetes    Mean Plasma Glucose 125.5 mg/dL    Comment: Performed at [facility], [address], [place], [place] [address]  Lipid panel     Status: Abnormal   Collection Time: 10/13/17  6:45 AM  Result Value Ref Range   Cholesterol 187 0 - 200 mg/dL   Triglycerides 70 <604 mg/dL   HDL 69 >54 mg/dL   Total CHOL/HDL Ratio 2.7 RATIO   VLDL 14 0 - 40 mg/dL   LDL Cholesterol 098 (H) 0 - 99 mg/dL    Comment:        Total Cholesterol/HDL:CHD Risk Coronary Heart Disease Risk Table                     Men   Women  1/2 Average Risk   3.4   3.3  Average Risk       5.0   4.4  2 X Average Risk   9.6   7.1  3 X Average Risk  23.4   11.0        Use the calculated Patient Ratio above and the CHD Risk Table to determine the patient's CHD Risk.        ATP III CLASSIFICATION (LDL):  <100     mg/dL   Optimal  119-147  mg/dL   Near or Above                    Optimal  130-159  mg/dL   Borderline  829-562  mg/dL   High  >130     mg/dL   Very High Performed at [facility], [address] [place], [place] [address]  TSH     Status: None   Collection Time: 10/13/17  6:45 AM  Result Value Ref Range   TSH 2.138 0.350 - 4.500 uIU/mL    Comment: Performed by a 3rd Generation assay with a functional sensitivity of <=0.01 uIU/mL. Performed at [facility],  [address], [place], [place] [address]     Blood Alcohol level:  Lab Results  Component Value Date   [facility] <10 10/11/2017   ETH <11 01/06/2013    Metabolic Disorder Labs: Lab Results  Component Value Date   HGBA1C 6.0 (H) 10/13/2017   MPG 125.5 10/13/2017   MPG 123 (H) 10/09/2012   No results found for: PROLACTIN Lab Results  Component Value Date   CHOL 187 10/13/2017   TRIG 70 10/13/2017   HDL 69 10/13/2017   CHOLHDL 2.7 10/13/2017   VLDL 14 10/13/2017   LDLCALC 104 (H) 10/13/2017   LDLCALC 89 10/09/2012    Physical Findings: AIMS: Facial and Oral Movements Muscles of Facial Expression: None, normal Lips and Perioral Area: None, normal Jaw: None, normal Tongue: None, normal,Extremity Movements Upper (arms, wrists, hands, fingers): None, normal Lower (legs, knees, ankles, toes): None, normal, Trunk Movements Neck, shoulders, hips: None, normal, Overall Severity Severity of abnormal movements (highest score from questions above): None, normal Incapacitation due to abnormal movements: None, normal Patient's awareness of abnormal movements (rate only patient's report): No Awareness, Dental Status Current problems with teeth and/or dentures?: Yes(teeth pain, left side due to recent fall ) Does patient usually wear dentures?: No  CIWA:    COWS:     Musculoskeletal: Strength & Muscle Tone: within normal limits Gait & Station: normal Patient leans: N/A  Psychiatric Specialty Exam: Physical Exam  Nursing note and vitals reviewed.   Review of Systems  Constitutional: Negative for fever.  Respiratory: Negative for cough.   Cardiovascular: Negative for chest pain.  Gastrointestinal: Negative for abdominal pain, heartburn, nausea and vomiting.  Psychiatric/Behavioral: Positive for hallucinations. Negative for depression and suicidal ideas. The patient is not nervous/anxious.     Blood pressure 136/79, pulse 96, temperature 97.9 F (36.6 C), temperature source  Oral, resp. rate 16, height 5\' 10"  (1.778 m), weight 81 kg (178 lb 8 oz).Body mass index is 25.61 kg/m.  General Appearance: Casual and Fairly Groomed  Eye Contact:  Good  Speech:  Clear and Coherent and Normal Rate  Volume:  Normal  Mood:  Euthymic  Affect:  Appropriate, Congruent and Flat  Thought Process:  Coherent and Goal Directed  Orientation:  Full (Time, Place, and Person)  Thought Content:  Hallucinations: Auditory  Suicidal Thoughts:  No  Homicidal Thoughts:  No  Memory:  Immediate;   Good Recent;   Good Remote;   Good  Judgement:  Fair  Insight:  Fair  Psychomotor Activity:  Normal  Concentration:  Concentration: Good  Recall:  Fair  Fund of Knowledge:  Fair  Language:  Fair  Akathisia:  No  Handed:    AIMS (if indicated):     Assets:  Manufacturing systems engineer Physical Health Resilience Social Support  ADL's:  Intact  Cognition:  WNL  Sleep:  Number of Hours: 6     Treatment Plan Summary: Daily contact with patient to assess and evaluate symptoms and progress in treatment and Medication management. Pt reports improvement of AH to the point he is unable to understand what AH are saying. He is anticipating discharge to outpatient level of care in the AM if he continues to have  improvement of his symptoms. He will continue on current treatment regimen without changes at this time.  - Continue inpatient hospitalization   - Schizophrenia   - Continue risperdal 2mg  BID   - Continue prozac 20mg  qDay  - EPS   - Continue cogentin 1mg  BID  - anxiety   - Continue atarax 25mg  TID prn anxiety  - insomnia   - continue trazodone 50mg  qhs prn insomnia   -Encourage participation in groups and the therapeutic milieu  - Discharge planning will be ongoing    Micheal Likenshristopher T Glinda Natzke, MD 10/13/2017, 3:35 PM

## 2017-10-13 NOTE — Progress Notes (Signed)
Patient seen at this time sleeping. No distress noted. Will continue to monitor patient. Safety maintained.

## 2017-10-13 NOTE — Progress Notes (Signed)
Patient did not attend wrap up group. 

## 2017-10-13 NOTE — Progress Notes (Addendum)
Patient ID: Rhodia Albrightlijah L Imhof, male   DOB: Dec 08, 1965, 51 y.o.   MRN: [id number]  DAR: Pt. Denies SI/HI and visual Hallucinations. He does continued to report auditory hallucinations however reports that he is not able to understand what they are saying now as they are decreasing in intensity. He reports sleep last night was good, his appetite is good, his energy level is normal, and his concentration is good. He rates his depression level 2/10, his hopelessness level 4/10, and his anxiety level 0/10. Patient does report some pain in his face and received PRN Ibuprofen and heat packs. He reports some mild relief on reassessment. Support and encouragement provided to the patient. Scheduled medications administered to patient per physician's orders. Patient is receptive and cooperative. He is seen in the milieu and is interacting with his peers. He is pleasant and cooperative with staff. Q15 minute checks are maintained for safety.

## 2017-10-13 NOTE — Progress Notes (Signed)
Recreation Therapy Notes  INPATIENT RECREATION THERAPY ASSESSMENT  Patient Details Name: Damon [name] MRN: [id number] DOB: [birth year] Today's Date: 10/13/2017  Patient Stressors: Death  Pt stated his mother died a year ago, uncle died a week ago and sister died six months ago.  Coping Skills:   Isolate, Avoidance, Exercise, Talking, Music, Sports  Personal Challenges: Concentration, Decision-Making, Expressing Yourself, Stress Management, Trusting Others, Work Nutritional therapisterformance  Leisure Interests (2+):  Exercise - Walking, Music - Listen, Garment/textile technologistCommunity - Other (Comment)(Gym)  Awareness of Community Resources:  Yes  Community Resources:  [place], [employer], [employer]  Current Use: Yes  Patient Strengths:  Interact with others; good communication skills  Patient Identified Areas of Improvement:  Working Programmer, applicationsskills; Advertising copywriterconcentration  Current Recreation Participation:  2 times a day  Patient Goal for Hospitalization:  "To be able to handle stress"  [place]  [place]  Current [place] (including self-harm):  No  Current HI:  No  Consent to Intern Participation: N/A    Caroll RancherMarjette Pennye Beeghly, LRT/CTRS  Caroll RancherLindsay, Jiro Kiester A 10/13/2017, 1:05 PM

## 2017-10-14 LAB — PROLACTIN: Prolactin: 55 ng/mL — ABNORMAL HIGH (ref 4.0–15.2)

## 2017-10-14 MED ORDER — TRAZODONE HCL 50 MG PO TABS: 50.0000 mg | ORAL_TABLET | Freq: Every evening | ORAL | 0 refills | Status: DC | PRN

## 2017-10-14 MED ORDER — FLUOXETINE HCL 20 MG PO CAPS: 20.0000 mg | ORAL_CAPSULE | Freq: Every day | ORAL | 0 refills | Status: DC

## 2017-10-14 MED ORDER — RISPERIDONE 2 MG PO TABS: 2.0000 mg | ORAL_TABLET | Freq: Two times a day (BID) | ORAL | 0 refills | Status: DC

## 2017-10-14 MED ORDER — NICOTINE 7 MG/24HR TD PT24: 7.0000 mg | MEDICATED_PATCH | Freq: Every day | TRANSDERMAL | 0 refills | Status: DC

## 2017-10-14 MED ORDER — ACETAMINOPHEN 500 MG PO TABS: 500.0000 mg | ORAL_TABLET | Freq: Four times a day (QID) | ORAL | 0 refills | Status: DC | PRN

## 2017-10-14 NOTE — Plan of Care (Signed)
Pt was able to identify stress management techniques after meeting with LRT 1:1.   [name], LRT/CTRS

## 2017-10-14 NOTE — Progress Notes (Signed)
Recreation Therapy Notes  Date: 10/14/17 Time: 1000 Location: 500 Hall Dayroom  Group Topic: Leisure Education  Goal Area(s) Addresses:  Patient will identify positive leisure activities.  Patient will identify one positive benefit of participation in leisure activities.   Behavioral Response: Engaged  Intervention: AT&TWhite board, dry erase marker, eraser, various words  Activity:  Pictionary.  LRT divided the group into two teams.  Each team was given one minute to draw/guess their picture.  If they did not guess the correct answer, the other team got a chance to still the point.    Education:  Leisure Education, Building control surveyorDischarge Planning  Education Outcome: Acknowledges education/In group clarification offered/Needs additional education  Clinical Observations/Feedback: Pt was pleasant and active during group.  Pt worked well with his peers and was engaged throughout.    Caroll RancherMarjette Laurelai Lepp, LRT/CTRS       Caroll RancherLindsay, Gilberte Gorley A 10/14/2017 11:33 AM

## 2017-10-14 NOTE — Discharge Summary (Signed)
Physician Discharge Summary Note  Patient:  Damon [name] is an 51 y.o., male  MRN:  [id number]  DOB:  09-26-1966  Patient phone:  [phone] (home)   Patient address:   [address] [place] [address],   Total Time spent with patient: Greater than 30 minutes  Date of Admission:  10/12/2017 Date of Discharge: 10-14-17  Reason for Admission: Worsening psychosis after a head injury.  Principal Problem: Schizophrenia, unspecified [facility])  Discharge Diagnoses: Patient Active Problem List   Diagnosis Date Noted  . Schizophrenia, unspecified (HCC) [F20.9] 10/12/2017  . Schizophrenia (HCC) [F20.9] 10/10/2012  . Paranoid schizophrenia (HCC) [F20.0]   . Depression [F32.9]   . Schizophrenia, paranoid type (HCC) [F20.0] 11/12/2011    Class: Acute   Past Psychiatric History: Schizophrenia.  Past Medical History:  Past Medical History:  Diagnosis Date  . Depression   . Paranoid schizophrenia (HCC)    History reviewed. No pertinent surgical history. Family History: History reviewed. No pertinent family history.  Family Psychiatric  History: See H&P  Social History:  Social History   Substance and Sexual Activity  Alcohol Use Yes  . Alcohol/week: 0.6 oz  . Types: 1 Cans of beer per week   Comment: one can of beer weekly     Social History   Substance and Sexual Activity  Drug Use No    Social History   Socioeconomic History  . Marital status: Single    Spouse name: None  . Number of children: None  . Years of education: None  . Highest education level: None  Social Needs  . Financial resource strain: None  . Food insecurity - worry: None  . Food insecurity - inability: None  . Transportation needs - medical: None  . Transportation needs - non-medical: None  Occupational History  . None  Tobacco Use  . Smoking status: Current Every Day Smoker    Packs/day: 0.25    Types: Cigarettes  . Smokeless tobacco: Current User    Types: Chew   Substance and Sexual Activity  . Alcohol use: Yes    Alcohol/week: 0.6 oz    Types: 1 Cans of beer per week    Comment: one can of beer weekly  . Drug use: No  . Sexual activity: Yes    Birth control/protection: Condom  Other Topics Concern  . None  Social History Narrative  . None   Hospital Course: Damon [name] is a 51 y/o M with history of schizophrenia who was admitted voluntarily after he presented to the ED with worsening symptoms of auditory hasllucinations in the context of recent fall and hitting his head. Pt was medically cleared at ED and transferred to [facility]. Pt reports that he had been experiencing auditory hallucinations in the recent weeks, but after he had a fall with a brief episode of syncope, he notes that symptoms were much worse. He began to have command auditory hallucinations which told him to kill himself & patient had even started to look for a wire with which to hang himself. At that point, decided to go to ED for more help instead.  After the above admission assessment, Damon [name]'s presenting symptoms were noted. The medication regimen for the presenting symptoms were discussed & initiated. He was restarted on his home medication of risperdal at a higher dose (and changed to BID dosing). He was also medicated & discharged on; Fluoxetine 20 mg for depression, Nicotine patch 7 mg for Nicotine withdrawal symptoms & Trazodone 50 mg  for insomnia. He was enrolled & participated in the group counseling sessions being offered & held on this unit. He learned coping skills. He presented no other significant health issues that required treatment.  Today upon his discharge evaluation with the attending psychiatrist, Damon [name] reports that he continues to have significant improvement of the auditory hallucinations with higher dose of risperdal and split dosing. He states he feels "a whole lot better." He rates auditory hallucinations's intensity as 2/10. He denies VH/SI/HI. He is sleeping  well and appetite is good. He is tolerating his medications without difficulty or side effects. He feels safe to discharge and he is able to engage in safety planning including plan to return to [facility] if he feels unable to maintain his own safety. Pt had no further questions, comments, or concerns at this time.  Damon [name] left [facility] with all his personal belongings in no apparent distress. Transportation per the city bus. [facility] assisted with bus pass.  Physical Findings: AIMS: Facial and Oral Movements Muscles of Facial Expression: None, normal Lips and Perioral Area: None, normal Jaw: None, normal Tongue: None, normal,Extremity Movements Upper (arms, wrists, hands, fingers): None, normal Lower (legs, knees, ankles, toes): None, normal, Trunk Movements Neck, shoulders, hips: None, normal, Overall Severity Severity of abnormal movements (highest score from questions above): None, normal Incapacitation due to abnormal movements: None, normal Patient's awareness of abnormal movements (rate only patient's report): No Awareness, Dental Status Current problems with teeth and/or dentures?: Yes(teeth pain, left side due to recent fall ) Does patient usually wear dentures?: No  CIWA:    COWS:     Musculoskeletal: Strength & Muscle Tone: within normal limits Gait & Station: normal Patient leans: N/A  Psychiatric Specialty Exam: Physical Exam  Constitutional: He appears well-developed.  HENT:  Head: Normocephalic.  Eyes: Pupils are equal, round, and reactive to light.  Neck: Normal range of motion.  Cardiovascular:  HX THN  Respiratory: Effort normal.  GI: Soft.  Genitourinary:  Genitourinary Comments: Deferred  Musculoskeletal: Normal range of motion.  Neurological: He is alert.  Skin: Skin is warm.    Review of Systems  Constitutional: Negative.   HENT: Negative.   Eyes: Negative.   Respiratory: Negative.   Cardiovascular: Negative.   Gastrointestinal: Negative.   Genitourinary:  Negative.   Musculoskeletal: Negative.   Skin: Negative.   Neurological: Negative.   Endo/Heme/Allergies: Negative.   Psychiatric/Behavioral: Positive for depression (Stabilized with medication prior to discharge) and hallucinations. Negative for memory loss, substance abuse and suicidal ideas. The patient has insomnia (Stabilized with medication prior to discharge). The patient is not nervous/anxious.     Blood pressure (!) 151/76, pulse 91, temperature 98 F (36.7 C), temperature source Oral, resp. rate 16, height 5\' 10"  (1.778 m), weight 81 kg (178 lb 8 oz).Body mass index is 25.61 kg/m.  See Md's SRA   Have you used any form of tobacco in the last 30 days? (Cigarettes, Smokeless Tobacco, Cigars, and/or Pipes): Yes  Has this patient used any form of tobacco in the last 30 days? (Cigarettes, Smokeless Tobacco, Cigars, and/or Pipes): Yes, A prescription for an FDA-approved tobacco cessation medication was offered upon discharge & recommended to purchase from over the counter at the pharmacy.  Blood Alcohol level:  Lab Results  Component Value Date   [facility] <10 10/11/2017   ETH <11 01/06/2013   Metabolic Disorder Labs:  Lab Results  Component Value Date   HGBA1C 6.0 (H) 10/13/2017   MPG 125.5 10/13/2017   MPG 123 (  H) 10/09/2012   Lab Results  Component Value Date   PROLACTIN 55.0 (H) 10/13/2017   Lab Results  Component Value Date   CHOL 187 10/13/2017   TRIG 70 10/13/2017   HDL 69 10/13/2017   CHOLHDL 2.7 10/13/2017   VLDL 14 10/13/2017   LDLCALC 104 (H) 10/13/2017   LDLCALC 89 10/09/2012   See Psychiatric Specialty Exam and Suicide Risk Assessment completed by Attending Physician prior to discharge.  Discharge destination:  Home  Is patient on multiple antipsychotic therapies at discharge:  No   Has Patient had three or more failed trials of antipsychotic monotherapy by history:  No  Recommended Plan for Multiple Antipsychotic Therapies: NA  Allergies as of  10/14/2017      Reactions   Spinach Hives      Medication List    STOP taking these medications   benztropine 1 MG tablet Commonly known as:  COGENTIN   diclofenac 50 MG tablet Commonly known as:  CATAFLAM   naproxen 500 MG tablet Commonly known as:  NAPROSYN   traMADol 50 MG tablet Commonly known as:  ULTRAM     TAKE these medications     Indication  acetaminophen 500 MG tablet Commonly known as:  TYLENOL Take 1 tablet (500 mg total) by mouth every 6 (six) hours as needed for mild pain.  Indication:  Fever, Pain   FLUoxetine 20 MG capsule Commonly known as:  PROZAC Take 1 capsule (20 mg total) by mouth daily. For depression Start taking on:  10/15/2017 What changed:  additional instructions  Indication:  Major Depressive Disorder   nicotine 7 mg/24hr patch Commonly known as:  NICODERM CQ - dosed in mg/24 hr Place 1 patch (7 mg total) onto the skin daily. (May purchase from over the counter): For smoking cessation Start taking on:  10/15/2017  Indication:  Nicotine Addiction   risperiDONE 2 MG tablet Commonly known as:  RISPERDAL Take 1 tablet (2 mg total) by mouth 2 (two) times daily. For mood control What changed:    medication strength  how much to take  when to take this  additional instructions  Indication:  Mood control   traZODone 50 MG tablet Commonly known as:  DESYREL Take 1 tablet (50 mg total) by mouth at bedtime as needed for sleep.  Indication:  Trouble Sleeping      Follow-up Information    [facility] Follow up on 10/19/2017.   Specialty:  Behavioral Health Why:  Follow up appointment is scheduled for 10/19/17 at 2:20pm.  Please bring your ID and any insurance information with you.  Contact informationElpidio Eric: [address] [place] [address] [phone]          Follow-up recommendations: Activity:  As tolerated Diet: As recommended by your primary care doctor. Keep all scheduled follow-up appointments as recommended.  Comments:  Patient is instructed prior to discharge to: Take all medications as prescribed by his/her mental healthcare provider. Report any adverse effects and or reactions from the medicines to his/her outpatient provider promptly. Patient has been instructed & cautioned: To not engage in alcohol and or illegal drug use while on prescription medicines. In the event of worsening symptoms, patient is instructed to call the crisis hotline, 911 and or go to the nearest ED for appropriate evaluation and treatment of symptoms. To follow-up with his/her primary care provider for your other medical issues, concerns and or health care needs.   Signed: Sanjuana KavaNwoko, Agnes I, NP, PMHNP, FNP-BC 10/14/2017, 10:53 AM  Patient seen, Suicide Assessment Completed.  Disposition Plan Reviewed   Frankey Pootlijah Donley is a 51 y/o M with history of schizophrenia who was admitted with worsening CAH to kill himself which worsened after he had an episode of syncope and a fall at home. Pt was restarted on home medication of risperdal at higher dose (and changed to BID dosing). Today upon evaluation, pt reports that he continues to have significant improvement of AH with higher dose of risperdal and split dosing. He states he feels "a whole lot better." He rates AH intensity as 2/10. He denies VH/SI/HI. He is sleeping well and appetite is good. He is tolerating his medications without difficulty or side effects. He feels safe to discharge and he is able to engage in safety planning including [facility] if [facility] feels unable to maintain his own safety. Pt had no further questions, comments, or concerns.   Plan Of Care/Follow-up recommendations:  -Discharge to outpatient level of care  - Schizophrenia  - Continuerisperdal 2mg  BID  - Continue prozac 20mg  qDay  - EPS  - Continue cogentin 1mg  BID  - anxiety  - Continue atarax 25mg  TID prn anxiety  - insomnia  - continue  trazodone 50mg  qhs prn insomnia   Activity:  as tolerated Diet:  normal Tests:  NA Other:  see above for DC plan  Micheal Likenshristopher T Marlyss Cissell, MD

## 2017-10-14 NOTE — [facility] Suicide Risk Assessment (Signed)
[facility]   Principal Problem: Schizophrenia, unspecified [facility](HCC) Discharge Diagnoses:  Patient Active Problem List   Diagnosis Date Noted  . Schizophrenia, unspecified (HCC) [F20.9] 10/12/2017  . Schizophrenia (HCC) [F20.9] 10/10/2012  . Paranoid schizophrenia (HCC) [F20.0]   . Depression [F32.9]   . Schizophrenia, paranoid type (HCC) [F20.0] 11/12/2011    Class: Acute    Total Time spent with patient: 30 minutes  Musculoskeletal: Strength & Muscle Tone: within normal limits Gait & Station: normal Patient leans: N/A  Psychiatric Specialty Exam: Review of Systems  Constitutional: Negative for chills and fever.  Respiratory: Negative for cough and shortness of breath.   Cardiovascular: Negative for chest pain.  Gastrointestinal: Negative for abdominal pain, heartburn, nausea and vomiting.  Psychiatric/Behavioral: Positive for hallucinations. Negative for depression and suicidal ideas.    Blood pressure (!) 151/76, pulse 91, temperature 98 F (36.7 C), temperature source Oral, resp. rate 16, height 5\' 10"  (1.778 m), weight 81 kg (178 lb 8 oz).Body mass index is 25.61 kg/m.  General Appearance: Casual and Fairly Groomed  Patent attorneyye Contact::  Good  Speech:  Clear and Coherent and Normal Rate  Volume:  Normal  Mood:  Euthymic  Affect:  Appropriate, Congruent and Constricted  Thought Process:  Coherent and Goal Directed  Orientation:  Full (Time, Place, and Person)  Thought Content:  Logical and Hallucinations: Auditory  Suicidal Thoughts:  No  Homicidal Thoughts:  No  Memory:  Immediate;   Good Recent;   Good Remote;   Good  Judgement:  Fair  Insight:  Fair  Psychomotor Activity:  Normal  Concentration:  Good  Recall:  Good  Fund of Knowledge:Good  Language: Good  Akathisia:  No  Handed:    AIMS (if indicated):     Assets:  Manufacturing systems engineerCommunication Skills Physical Health Resilience Social Support  Sleep:  Number of Hours: 6.75  Cognition: WNL  ADL's:   Intact   Mental Status Per Nursing Assessment::   On Admission:     Demographic Factors:  Male, Low socioeconomic status, Living alone and Unemployed  Loss Factors: Financial problems/change in socioeconomic status  Historical Factors: NA  Risk Reduction Factors:   Positive social support, Positive therapeutic relationship and Positive coping skills or problem solving skills  Continued Clinical Symptoms:  Schizophrenia:   Paranoid or undifferentiated type  Cognitive Features That Contribute To Risk:  None    Suicide Risk:  Minimal: No identifiable suicidal ideation.  Patients presenting with no risk factors but with morbid ruminations; may be classified as minimal risk based on the severity of the depressive symptoms  Follow-up Information    [facility] Follow up on 10/19/2017.   Specialty:  Behavioral Health Why:  Follow up appointment is scheduled for 10/19/17 at 2:20pm.  Please bring your ID and any insurance information with you.  Contact information: [address] [place] [address] [phone]         Subjective Data: Damon [name] is a 51 y/o M with history of schizophrenia who was admitted with worsening CAH to kill himself which worsened after he had an episode of syncope and a fall at home. Pt was restarted on home medication of risperdal at higher dose (and changed to BID dosing). Today upon evaluation, pt reports that he continues to have significant improvement of AH with higher dose of risperdal and split dosing. He states he feels "a whole lot better." He rates AH intensity as 2/10. He denies VH/SI/HI. He is sleeping well and appetite is  good. He is tolerating his medications without difficulty or side effects. He feels safe to discharge and he is able to engage in safety planning including [facility] if he feels unable to maintain his own safety. Pt had no further questions, comments, or concerns.   Plan Of Care/Follow-up recommendations:  -  Discharge to outpatient level of care  - Schizophrenia              - Continue risperdal 2mg  BID              - Continue prozac 20mg  qDay  - EPS              - Continue cogentin 1mg  BID  - anxiety              - Continue atarax 25mg  TID prn anxiety  - insomnia              - continue trazodone 50mg  qhs prn insomnia   Activity:  as tolerated Diet:  normal Tests:  NA Other:  see above for DC plan  Micheal Likenshristopher T Marline Morace, MD 10/14/2017, 10:40 AM

## 2017-10-14 NOTE — Progress Notes (Signed)
D: Pt A & O X4. Presents guarded / cautious. Forwards on conversations when engaged. Denies SI, HI, AVH and pain. Cooperative with care. D/C home as per MD's order. Bus pass given for transportation.  A: D/C instructions reviewed with pt including follow up appointment, prescriptions and medication samples. Scheduled medications administered as per MD's orders and effects monitored. Compliance with d/c instructions encouraged. All belongings from locker 9 returned to pt prior to d/c. Q 15 minutes safety checks maintained without self harm gestures till time of d/c.  R: Pt verbalized understanding related to d/c instructions. Signed belonging sheet in agreement with items received. Compliant with medications, denies adverse drug reactions when assessed. Ambulatory with a steady gait. Appears to be in no physical distress at time of departure from facility.

## 2017-10-14 NOTE — Progress Notes (Signed)
Recreation Therapy Notes  INPATIENT RECREATION TR PLAN  Patient Details Name: BLAYTON HUTTNER MRN: [id number] DOB: 16-Mar-1966 Today's Date: 10/14/2017  Rec Therapy Plan Is patient appropriate for Therapeutic Recreation?: Yes Treatment times per week: about 3 days  Estimated Length of Stay: 5-7 days TR Treatment/Interventions: Group participation (Comment)  Discharge Criteria Pt will be discharged from therapy if:: Discharged Treatment plan/goals/alternatives discussed and agreed upon by:: Patient/family  Discharge Summary Short term goals met: Complete Progress toward goals comments: Groups attended Which groups?: Wellness, Leisure education Reason goals not met: None Therapeutic equipment acquired: N/A Reason patient discharged from therapy: Discharge from hospital Pt/family agrees with progress & goals achieved: Yes Date patient discharged from therapy: 10/14/17   Victorino Sparrow, LRT/CTRS  Ria Comment, [place] 10/14/2017, 1:16 PM

## 2017-10-14 NOTE — Progress Notes (Signed)
Recreation Therapy Notes  10/14/17 1300:  LRT met with pt in his room and spoke with him about some stress management techniques.  LRT gave pt a packet that went over each stress management technique in detail.  LRT went over how each technique works with the pt.  Pt did not have any questions about the packet.   Victorino Sparrow, LRT/CTRS      Victorino Sparrow A 10/14/2017 1:11 PM

## 2017-12-20 DIAGNOSIS — F209 Schizophrenia, unspecified: Secondary | ICD-10-CM | POA: Diagnosis not present

## 2018-03-02 DIAGNOSIS — F209 Schizophrenia, unspecified: Secondary | ICD-10-CM | POA: Diagnosis not present

## 2018-06-28 DIAGNOSIS — F209 Schizophrenia, unspecified: Secondary | ICD-10-CM | POA: Diagnosis not present

## 2018-06-28 DIAGNOSIS — F332 Major depressive disorder, recurrent severe without psychotic features: Secondary | ICD-10-CM | POA: Diagnosis not present

## 2018-06-28 DIAGNOSIS — F251 Schizoaffective disorder, depressive type: Secondary | ICD-10-CM | POA: Diagnosis not present

## 2018-09-06 DIAGNOSIS — F251 Schizoaffective disorder, depressive type: Secondary | ICD-10-CM | POA: Diagnosis not present

## 2018-09-06 DIAGNOSIS — F209 Schizophrenia, unspecified: Secondary | ICD-10-CM | POA: Diagnosis not present

## 2018-09-06 DIAGNOSIS — F332 Major depressive disorder, recurrent severe without psychotic features: Secondary | ICD-10-CM | POA: Diagnosis not present

## 2018-09-22 ENCOUNTER — Emergency Department (HOSPITAL_COMMUNITY): Payer: Medicare Other

## 2018-09-22 ENCOUNTER — Observation Stay (HOSPITAL_COMMUNITY)
Admission: EM | Admit: 2018-09-22 | Discharge: 2018-09-23 | Disposition: A | Discharge status: Home / Self Care | Payer: Medicare Other | Attending: Internal Medicine | Admitting: Internal Medicine

## 2018-09-22 ENCOUNTER — Encounter (HOSPITAL_COMMUNITY): Payer: Self-pay

## 2018-09-22 ENCOUNTER — Other Ambulatory Visit: Payer: Self-pay

## 2018-09-22 DIAGNOSIS — F17210 Nicotine dependence, cigarettes, uncomplicated: Secondary | ICD-10-CM | POA: Insufficient documentation

## 2018-09-22 DIAGNOSIS — F200 Paranoid schizophrenia: Secondary | ICD-10-CM | POA: Diagnosis not present

## 2018-09-22 DIAGNOSIS — R61 Generalized hyperhidrosis: Secondary | ICD-10-CM | POA: Diagnosis not present

## 2018-09-22 DIAGNOSIS — M5432 Sciatica, left side: Secondary | ICD-10-CM | POA: Diagnosis not present

## 2018-09-22 DIAGNOSIS — R55 Syncope and collapse: Secondary | ICD-10-CM | POA: Diagnosis not present

## 2018-09-22 DIAGNOSIS — R9431 Abnormal electrocardiogram [ECG] [EKG]: Secondary | ICD-10-CM | POA: Diagnosis not present

## 2018-09-22 DIAGNOSIS — R030 Elevated blood-pressure reading, without diagnosis of hypertension: Secondary | ICD-10-CM | POA: Diagnosis present

## 2018-09-22 DIAGNOSIS — R072 Precordial pain: Principal | ICD-10-CM | POA: Insufficient documentation

## 2018-09-22 DIAGNOSIS — R0789 Other chest pain: Secondary | ICD-10-CM | POA: Diagnosis not present

## 2018-09-22 DIAGNOSIS — M5442 Lumbago with sciatica, left side: Secondary | ICD-10-CM | POA: Diagnosis not present

## 2018-09-22 DIAGNOSIS — Z79899 Other long term (current) drug therapy: Secondary | ICD-10-CM | POA: Insufficient documentation

## 2018-09-22 DIAGNOSIS — F329 Major depressive disorder, single episode, unspecified: Secondary | ICD-10-CM | POA: Diagnosis not present

## 2018-09-22 DIAGNOSIS — Z6826 Body mass index (BMI) 26.0-26.9, adult: Secondary | ICD-10-CM | POA: Diagnosis not present

## 2018-09-22 DIAGNOSIS — I213 ST elevation (STEMI) myocardial infarction of unspecified site: Secondary | ICD-10-CM | POA: Diagnosis not present

## 2018-09-22 DIAGNOSIS — Z8249 Family history of ischemic heart disease and other diseases of the circulatory system: Secondary | ICD-10-CM | POA: Insufficient documentation

## 2018-09-22 DIAGNOSIS — R079 Chest pain, unspecified: Secondary | ICD-10-CM | POA: Diagnosis present

## 2018-09-22 DIAGNOSIS — R0602 Shortness of breath: Secondary | ICD-10-CM | POA: Diagnosis not present

## 2018-09-22 LAB — CBC
HCT: 40.7 % (ref 39.0–52.0)
Hemoglobin: 13.4 g/dL (ref 13.0–17.0)
MCH: 29.6 pg (ref 26.0–34.0)
MCHC: 32.9 g/dL (ref 30.0–36.0)
MCV: 89.8 fL (ref 80.0–100.0)
Platelets: 347 10*3/uL (ref 150–400)
RBC: 4.53 MIL/uL (ref 4.22–5.81)
RDW: 13.2 % (ref 11.5–15.5)
WBC: 8.5 10*3/uL (ref 4.0–10.5)
nRBC: 0 % (ref 0.0–0.2)

## 2018-09-22 LAB — BASIC METABOLIC PANEL
Anion gap: 11 (ref 5–15)
BUN: 13 mg/dL (ref 6–20)
CALCIUM: 9.8 mg/dL (ref 8.9–10.3)
CO2: 24 mmol/L (ref 22–32)
CREATININE: 1.07 mg/dL (ref 0.61–1.24)
Chloride: 101 mmol/L (ref 98–111)
GFR calc Af Amer: >60 mL/min (ref >60)
GFR calc non Af Amer: >60 mL/min (ref >60)
GLUCOSE: 92 mg/dL (ref 70–99)
Potassium: 3.8 mmol/L (ref 3.5–5.1)
Sodium: 136 mmol/L (ref 135–145)

## 2018-09-22 LAB — I-STAT TROPONIN, ED: TROPONIN I, POC: 0 ng/mL (ref 0.00–0.08)

## 2018-09-22 LAB — TROPONIN I

## 2018-09-22 MED ORDER — TRAZODONE HCL 50 MG PO TABS
50.0000 mg | ORAL_TABLET | Freq: Every evening | ORAL | Status: DC | PRN
  Administered 2018-09-22: 50 mg via ORAL
  Filled 2018-09-22: qty 1

## 2018-09-22 MED ORDER — IOPAMIDOL (ISOVUE-370) INJECTION 76%
100.0000 mL | Freq: Once | INTRAVENOUS | Status: AC | PRN
  Administered 2018-09-22: 100 mL via INTRAVENOUS

## 2018-09-22 MED ORDER — FLUOXETINE HCL 20 MG PO CAPS
20.0000 mg | ORAL_CAPSULE | Freq: Every day | ORAL | Status: DC
  Administered 2018-09-23: 20 mg via ORAL
  Filled 2018-09-22: qty 1

## 2018-09-22 MED ORDER — RISPERIDONE 2 MG PO TABS
2.0000 mg | ORAL_TABLET | Freq: Two times a day (BID) | ORAL | Status: DC
  Administered 2018-09-22 – 2018-09-23 (×2): 2 mg via ORAL
  Filled 2018-09-22 (×2): qty 1

## 2018-09-22 MED ORDER — ONDANSETRON HCL 4 MG/2ML IJ SOLN: 4.0000 mg | Freq: Four times a day (QID) | INTRAMUSCULAR | Status: DC | PRN

## 2018-09-22 MED ORDER — MORPHINE SULFATE (PF) 2 MG/ML IV SOLN: 2.0000 mg | INTRAVENOUS | Status: DC | PRN

## 2018-09-22 MED ORDER — ENOXAPARIN SODIUM 40 MG/0.4ML ~~LOC~~ SOLN
40.0000 mg | SUBCUTANEOUS | Status: DC
  Administered 2018-09-23: 40 mg via SUBCUTANEOUS
  Filled 2018-09-22 (×2): qty 0.4

## 2018-09-22 MED ORDER — ACETAMINOPHEN 325 MG PO TABS: 650.0000 mg | ORAL_TABLET | ORAL | Status: DC | PRN

## 2018-09-22 MED ORDER — NITROGLYCERIN IN D5W 200-5 MCG/ML-% IV SOLN
0.0000 ug/min | Freq: Once | INTRAVENOUS | Status: AC
  Administered 2018-09-22: 5 ug/min via INTRAVENOUS
  Filled 2018-09-22: qty 250

## 2018-09-22 MED ORDER — IOPAMIDOL (ISOVUE-370) INJECTION 76%
INTRAVENOUS | Status: AC
  Filled 2018-09-22: qty 100

## 2018-09-22 NOTE — H&P (Signed)
Damon [name]:[id number] DOB: [birth year] DOA: 09/22/2018     PCP: System, Pcp Not In   Outpatient Specialists:   Psychiatry: Kandis Mannan    Patient arrived to ER on 09/22/18 at 1708  Patient coming from: home Lives alone,        Chief Complaint:  Chief Complaint  Patient presents with  . Chest Pain    HPI: Damon [name] is a 52 y.o. male with medical history significant of schizophrenia and depression tobacco abuse in remission    Presented with sudden onset of left arm numbness/chest pain started after he was running. HE runs on regukar basis no prior chest pain. This time the air was cold. CP Did not improve with rest. The pain was continues all day. Felt like a rib pushing in on the left side below left pectoral muscle. Was worse with deep breaths and palpation  Associated with SOB and nausea.   yesterday had an ankle episode of syncope presented to his primary care office today and had an hour chest pain today, apparently EKG was done in the office and was worrisome for ST segment elevation V2 V4 EMS was called he received 324 of aspirin at a time 4 mg of morphine 1 nitro with minimal relief patient was endorsing shortness of breath and diaphoresis  Regarding pertinent Chronic problems: Schizophrenia followed by Psychiatry   While in ER: Initial blood pressure 182/106 CTA chest no dissection, no PE Started on On nitro drip Trop negative  Now off nitro drip and chest pain free.  The following Work up has been ordered so far:  Orders Placed This Encounter  Procedures  . DG Chest 2 View  . CT Angio Chest/Abd/Pel for Dissection W and/or W/WO  . Basic metabolic panel  . CBC  . Cardiac monitoring  . Saline Lock IV, Maintain IV access  . Consult for [facility] Admission  . Pulse oximetry, continuous  . I-stat troponin, ED  . EKG 12-Lead  . ED EKG within 10 minutes    Following Medications were ordered in ER: Medications  iopamidol (ISOVUE-370) 76 %  injection (has no administration in time range)  nitroGLYCERIN 50 mg in dextrose 5 % 250 mL (0.2 mg/mL) infusion (5 mcg/min Intravenous New Bag/Given 09/22/18 1939)  iopamidol (ISOVUE-370) 76 % injection 100 mL (100 mLs Intravenous Contrast Given 09/22/18 1848)    Significant initial  Findings: Abnormal Labs Reviewed - No abnormal labs to display   Na 136 K 3.8  Cr    stable,    Lab Results  Component Value Date   CREATININE 1.07 09/22/2018   CREATININE 1.07 10/11/2017   CREATININE 0.92 11/13/2014      WBC 8.5 HG/HCT   stable,       Component Value Date/Time   HGB 13.4 09/22/2018 1720   HCT 40.7 09/22/2018 1720       Troponin (Point of Care Test) Recent Labs    09/22/18 1725  TROPIPOC 0.00        UA  not ordered    CXR -  NON acute  CTA Chest/abd/pelvis no acute ? There is marked irregularity of the right renal artery suggesting fibromuscular dysplasia  ECG:  Personally reviewed by me showing: HR : 64 Rhythm: NSR  EKG left ventricular hypertrophy with ST elevation but no change from prior QTC 440    ED Triage Vitals  Enc Vitals Group     BP 09/22/18 1711 (!) 182/106     Pulse  Rate 09/22/18 1711 72     Resp 09/22/18 1713 15     Temp --      Temp src --      SpO2 09/22/18 1711 100 %     Weight 09/22/18 1715 185 lb (83.9 kg)     Height 09/22/18 1715 5\' 11"  (1.803 m)     Head Circumference --      Peak Flow --      Pain Score 09/22/18 1715 5     Pain Loc --      Pain Edu? --      Excl. in GC? --   TMAX(24)@       Latest  Blood pressure (!) 153/100, pulse 70, resp. rate 13, height 5\' 11"  (1.803 m), weight 83.9 kg, SpO2 100 %.     Hospitalist was called for admission for Chest pain,    Review of Systems:    Pertinent positives include:  chest pain,  Constitutional:  No weight loss, night sweats, Fevers, chills, fatigue, weight loss  HEENT:  No headaches, Difficulty swallowing,Tooth/dental problems,Sore throat,  No sneezing, itching, ear  ache, nasal congestion, post nasal drip,  Cardio-vascular:  No Orthopnea, PND, anasarca, dizziness, palpitations.no Bilateral lower extremity swelling  GI:  No heartburn, indigestion, abdominal pain, nausea, vomiting, diarrhea, change in bowel habits, loss of appetite, melena, blood in stool, hematemesis Resp:  no shortness of breath at rest. No dyspnea on exertion, No excess mucus, no productive cough, No non-productive cough, No coughing up of blood.No change in color of mucus.No wheezing. Skin:  no rash or lesions. No jaundice GU:  no dysuria, change in color of urine, no urgency or frequency. No straining to urinate.  No flank pain.  Musculoskeletal:  No joint pain or no joint swelling. No decreased range of motion. No back pain.  Psych:  No change in mood or affect. No depression or anxiety. No memory loss.  Neuro: no localizing neurological complaints, no tingling, no weakness, no double vision, no gait abnormality, no slurred speech, no confusion  All systems reviewed and apart from HOPI all are negative  Past Medical History:   Past Medical History:  Diagnosis Date  . Depression   . Paranoid schizophrenia (HCC)       History reviewed. No pertinent surgical history.  Social History:  Ambulatory  Independently     reports that he has been smoking cigarettes. He has been smoking about 0.25 packs per day. His smokeless tobacco use includes chew. He reports that he drinks about 1.0 standard drinks of alcohol per week. He reports that he does not use drugs.     Family History:   Family History  Problem Relation Age of Onset  . Heart failure Mother   . Hyperlipidemia Father   . Diabetes Other     Allergies: Allergies  Allergen Reactions  . Spinach Hives     Prior to Admission medications   Medication Sig Start Date End Date Taking? Authorizing Provider  acetaminophen (TYLENOL) 500 MG tablet Take 1 tablet (500 mg total) by mouth every 6 (six) hours as needed  for mild pain. 10/14/17  Yes Armandina Stammer I, NP  FLUoxetine (PROZAC) 20 MG capsule Take 1 capsule (20 mg total) by mouth daily. For depression 10/15/17  Yes Armandina Stammer I, NP  risperiDONE (RISPERDAL) 2 MG tablet Take 1 tablet (2 mg total) by mouth 2 (two) times daily. For mood control 10/14/17  Yes Armandina Stammer I, NP  traZODone (DESYREL) 50 MG  tablet Take 1 tablet (50 mg total) by mouth at bedtime as needed for sleep. 10/14/17  Yes Nwoko, Nicole Kindred I, NP  nicotine (NICODERM CQ - DOSED IN MG/24 HR) 7 mg/24hr patch Place 1 patch (7 mg total) onto the skin daily. (May purchase from over the counter): For smoking cessation Patient not taking: Reported on 09/22/2018 10/15/17   Armandina Stammer I, NP   Physical Exam: Blood pressure (!) 153/100, pulse 70, resp. rate 13, height 5\' 11"  (1.803 m), weight 83.9 kg, SpO2 100 %. 1. General:  in No Acute distress  well  -appearing 2. Psychological: Alert and   Oriented 3. Head/ENT:   Moist   Mucous Membranes                          Head Non traumatic, neck supple                           Poor Dentition 4. SKIN:     decreased Skin turgor,  Skin clean Dry and intact no rash 5. Heart: Regular rate and rhythm no  Murmur, no Rub or gallop 6. Lungs:  Clear to auscultation bilaterally, no wheezes or crackles   7. Abdomen: Soft,  non-tender, Non distended  bowel sounds present 8. Lower extremities: no clubbing, cyanosis, or  edema 9. Neurologically Grossly intact, moving all 4 extremities equally   10. MSK: Normal range of motion   LABS:     Recent Labs  Lab 09/22/18 1720  WBC 8.5  HGB 13.4  HCT 40.7  MCV 89.8  PLT 347   Basic Metabolic Panel: Recent Labs  Lab 09/22/18 1720  NA 136  K 3.8  CL 101  CO2 24  GLUCOSE 92  BUN 13  CREATININE 1.07  CALCIUM 9.8      No results for input(s): AST, ALT, ALKPHOS, BILITOT, PROT, ALBUMIN in the last 168 hours. No results for input(s): LIPASE, AMYLASE in the last 168 hours. No results for input(s): AMMONIA  in the last 168 hours.    HbA1C: No results for input(s): HGBA1C in the last 72 hours. CBG: No results for input(s): GLUCAP in the last 168 hours.    Urine analysis:    Component Value Date/Time   COLORURINE YELLOW 12/09/2009 2331   APPEARANCEUR CLEAR 12/09/2009 2331   LABSPEC >=1.030 05/21/2014 1400   PHURINE 5.5 05/21/2014 1400   GLUCOSEU NEGATIVE 05/21/2014 1400   HGBUR TRACE (A) 05/21/2014 1400   BILIRUBINUR NEGATIVE 05/21/2014 1400   KETONESUR NEGATIVE 05/21/2014 1400   PROTEINUR NEGATIVE 05/21/2014 1400   UROBILINOGEN 0.2 05/21/2014 1400   NITRITE NEGATIVE 05/21/2014 1400   LEUKOCYTESUR NEGATIVE 05/21/2014 1400       Cultures: No results found for: SDES, SPECREQUEST, CULT, REPTSTATUS   Radiological Exams on Admission: Dg Chest 2 View  Result Date: 09/22/2018 CLINICAL DATA:  Chest pain EXAM: CHEST - 2 VIEW COMPARISON:  11/13/2014 FINDINGS: The heart size and mediastinal contours are within normal limits. Both lungs are clear. The visualized skeletal structures are unremarkable. IMPRESSION: No active cardiopulmonary disease. Electronically Signed   By: Jasmine Pang M.D.   On: 09/22/2018 18:29   Ct Angio Chest/abd/pel For Dissection W And/or W/wo  Result Date: 09/22/2018 CLINICAL DATA:  Chest pain, acute, high probability, ACS suspected. Sudden onset of left arm numbness and chest pain while running. EXAM: CT ANGIOGRAPHY CHEST, ABDOMEN AND PELVIS TECHNIQUE: Multidetector CT imaging through the chest,  abdomen and pelvis was performed using the standard protocol during bolus administration of intravenous contrast. Multiplanar reconstructed images and MIPs were obtained and reviewed to evaluate the vascular anatomy. CONTRAST:  ISOVUE-370 IOPAMIDOL (ISOVUE-370) INJECTION 76% COMPARISON:  None. FINDINGS: CTA CHEST FINDINGS Cardiovascular: Noncontrast imaging of the chest demonstrates no coronary artery calcifications or atherosclerotic calcifications of the aorta. Arterial  phase imaging demonstrates a normal appearance of the aortic arch and thoracic aorta. The pulmonary arteries are within normal limits. Mediastinum/Nodes: No significant mediastinal, axillary, or hilar adenopathy is present. Lungs/Pleura: The lungs are clear without focal nodule, mass, or airspace disease. No significant pleural or pericardial effusion is present. Musculoskeletal: Vertebral body heights and alignment are maintained. No focal lytic or blastic lesions are present. Ribs are unremarkable. Sternum is within normal limits. Clavicles are normal. Scapulae are within normal limits. Review of the MIP images confirms the above findings. CTA ABDOMEN AND PELVIS FINDINGS VASCULAR Aorta: Normal caliber aorta without aneurysm, dissection, vasculitis or significant stenosis. Celiac: Patent without evidence of aneurysm, dissection, vasculitis or significant stenosis. SMA: Patent without evidence of aneurysm, dissection, vasculitis or significant stenosis. Renals: Both renal arteries are patent. There is no focal stenosis or aneurysm. There is marked irregularity of the right renal artery suggesting fibromuscular dysplasia. More subtle irregularity is present of the superior left renal artery. There is a duplicated left renal artery feeding the lower pole. IMA: Patent without evidence of aneurysm, dissection, vasculitis or significant stenosis. Inflow: Patent without evidence of aneurysm, dissection, vasculitis or significant stenosis. Veins: No obvious venous abnormality within the limitations of this arterial phase study. Review of the MIP images confirms the above findings. NON-VASCULAR Hepatobiliary: No focal liver abnormality is seen. No gallstones, gallbladder wall thickening, or biliary dilatation. Pancreas: Unremarkable. No pancreatic ductal dilatation or surrounding inflammatory changes. Spleen: Normal in size without focal abnormality. Adrenals/Urinary Tract: The adrenal glands are normal. Kidneys and ureters  are within normal limits otherwise. The urinary bladder is unremarkable. Stomach/Bowel: Stomach and duodenum are within normal limits. The small bowel is normal. The terminal ileum is within normal limits. Appendix is visualized and normal. The ascending and transverse colon are normal. Descending and sigmoid colon are within normal limits. Lymphatic: No significant retroperitoneal adenopathy is present. Reproductive: Prostate is unremarkable. Other: No abdominal wall hernia or abnormality. No abdominopelvic ascites. Musculoskeletal: Vertebral body heights and alignment are normal. No focal lytic or blastic lesions are present. Pelvis is within normal limits. The hips are located and normal. Review of the MIP images confirms the above findings. IMPRESSION: 1. Normal CTA of the thoracic and abdominal aorta. No significant aneurysm or dissection. 2. Focal vessel irregularity involving the right greater than left renal arteries concerning for fibromuscular dysplasia. No vascular irregularity seen elsewhere. 3. CT of the chest is normal. 4. Abdomen and pelvis are otherwise unremarkable. Electronically Signed   By: Marin Roberts M.D.   On: 09/22/2018 19:15    Chart has been reviewed    Assessment/Plan  52 y.o. male with medical history significant of schizophrenia and depression tobacco abuse in remission Admitted for Chest pain evaluaiton   Present on Admission:  . Chest pain -in the setting of elevated blood pressure but also seem to be reproducible by palpation somewhat atypical presentation but will admit to telemetry cycle cardiac enzymes patient will benefit from cardiology work-up.  Please consult cardiology in a.m. or sooner if decompensates or cardiac markers turn positive. Order echogram to evaluate wall motion abnormality . Elevated BP without diagnosis of  hypertension - initiate norvasc, patient heart rate already in the 60s hold off on ACE inhibitor given abnormal renal artery on CTA  will likely need outpatient follow-up . Schizophrenia, paranoid type (HCC) -  stable continue home medications  Other plan as per orders.  DVT prophylaxis:    Lovenox     Code Status:  FULL CODE as per patient   I had personally discussed CODE STATUS with patient   Family Communication:   Family not  at  Bedside    Disposition Plan:     To home once workup is complete and patient is stable     Consults called: none  Admission status:   Obs    Level of care   tele  For  24H          Rhiann Boucher 09/23/2018, 12:25 AM    [facility]  Pager [phone]   after 2 AM please page floor coverage PA If 7AM-7PM, please contact the day team taking care of the patient  [email or web address]  Password [email or web address]

## 2018-09-22 NOTE — ED Triage Notes (Signed)
Pt BIB [facility] from MD office, sudden onset of L arm numbness w/ chest while running. Did not resolve w/ sitting down, episode of syncope yesterday.While at MD office today CP happened again, reshot 12 lead at office, noticed elevation in V2-V4- activated EMS at that time. Received 324 ASA, 4mg  morphine, 0.4 SL NTG w/ minimal relief of pain. Endorses SOB, diaphoresis. [id number]

## 2018-09-22 NOTE — ED Provider Notes (Signed)
[facility] EMERGENCY DEPARTMENT Provider Note   CSN: [id number] Arrival date & time: 09/22/18  1708     History   Chief Complaint Chief Complaint  Patient presents with  . Chest Pain    HPI Damon [name] is a 52 y.o. male.  52 year old male presents with sudden onset of substernal chest pain that began today.  Had similar episode yesterday where the pain is described as a pressure which started in his chest and radiated to his left arm.Burgess Estelle he had some associated dyspnea, diaphoresis as well as syncope.  Denies any injury from that.  Today episode happened again with light activity and he became dizzy.  Denies any prior history of heart disease but does have a history of untreated hypertension.  Was being seen at his doctor's office and EKG at that time was concerning he was sent here for further management.  Patient given aspirin as well as morphine prior to arrival.     Past Medical History:  Diagnosis Date  . Depression   . Paranoid schizophrenia [facility])     Patient Active Problem List   Diagnosis Date Noted  . Schizophrenia, unspecified (HCC) 10/12/2017  . Schizophrenia (HCC) 10/10/2012  . Paranoid schizophrenia (HCC)   . Depression   . Schizophrenia, paranoid type (HCC) 11/12/2011    Class: Acute    History reviewed. No pertinent surgical history.      Home Medications    Prior to Admission medications   Medication Sig Start Date End Date Taking? Authorizing Provider  acetaminophen (TYLENOL) 500 MG tablet Take 1 tablet (500 mg total) by mouth every 6 (six) hours as needed for mild pain. 10/14/17   Armandina Stammer I, NP  FLUoxetine (PROZAC) 20 MG capsule Take 1 capsule (20 mg total) by mouth daily. For depression 10/15/17   Armandina Stammer I, NP  nicotine (NICODERM CQ - DOSED IN MG/24 HR) 7 mg/24hr patch Place 1 patch (7 mg total) onto the skin daily. (May purchase from over the counter): For smoking cessation 10/15/17   Armandina Stammer I, NP    risperiDONE (RISPERDAL) 2 MG tablet Take 1 tablet (2 mg total) by mouth 2 (two) times daily. For mood control 10/14/17   Armandina Stammer I, NP  traZODone (DESYREL) 50 MG tablet Take 1 tablet (50 mg total) by mouth at bedtime as needed for sleep. 10/14/17   Sanjuana Kava, NP    Family History History reviewed. No pertinent family history.  Social History Social History   Tobacco Use  . Smoking status: Current Every Day Smoker    Packs/day: 0.25    Types: Cigarettes  . Smokeless tobacco: Current User    Types: Chew  Substance Use Topics  . Alcohol use: Yes    Alcohol/week: 1.0 standard drinks    Types: 1 Cans of beer per week    Comment: one can of beer weekly  . Drug use: No     Allergies   Spinach   Review of Systems Review of Systems  All other systems reviewed and are negative.    Physical Exam Updated Vital Signs BP (!) 182/106   Pulse 61   Resp 15   Ht 1.803 m (5\' 11" )   Wt 83.9 kg   SpO2 99%   BMI 25.80 kg/m   Physical Exam  Constitutional: He is oriented to person, place, and time. He appears well-developed and well-nourished.  Non-toxic appearance. No distress.  HENT:  Head: Normocephalic and atraumatic.  Eyes: Pupils are equal, round, and reactive to light. Conjunctivae, EOM and lids are normal.  Neck: Normal range of motion. Neck supple. No tracheal deviation present. No thyroid mass present.  Cardiovascular: Normal rate, regular rhythm and normal heart sounds. Exam reveals no gallop.  No murmur heard. Pulmonary/Chest: Effort normal and breath sounds normal. No stridor. No respiratory distress. He has no decreased breath sounds. He has no wheezes. He has no rhonchi. He has no rales.  Abdominal: Soft. Normal appearance and bowel sounds are normal. He exhibits no distension. There is no tenderness. There is no rebound and no CVA tenderness.  Musculoskeletal: Normal range of motion. He exhibits no edema or tenderness.  Neurological: He is alert and  oriented to person, place, and time. He has normal strength. No cranial nerve deficit or sensory deficit. GCS eye subscore is 4. GCS verbal subscore is 5. GCS motor subscore is 6.  Skin: Skin is warm and dry. No abrasion and no rash noted.  Psychiatric: He has a normal mood and affect. His speech is normal and behavior is normal.  Nursing note and vitals reviewed.    ED Treatments / Results  Labs (all labs ordered are listed, but only abnormal results are displayed) Labs Reviewed  BASIC METABOLIC PANEL  CBC  I-STAT TROPONIN, ED    EKG EKG Interpretation  Date/Time:  Friday September 22 2018 17:14:53 EST Ventricular Rate:  64 PR Interval:    QRS Duration: 83 QT Interval:  426 QTC Calculation: 440 R Axis:   63 Text Interpretation:  Sinus rhythm Consider left atrial enlargement Left ventricular hypertrophy ST elevation, consider anterior injury No significant change since last tracing Confirmed by Lorre Nick ([id number]) on 09/22/2018 5:24:17 PM   Radiology No results found.  Procedures Procedures (including critical care time)  Medications Ordered in ED Medications  nitroGLYCERIN 50 mg in dextrose 5 % 250 mL (0.2 mg/mL) infusion (has no administration in time range)     Initial Impression / Assessment and Plan / ED Course  I have reviewed the triage vital signs and the nursing notes.  Pertinent labs & imaging results that were available during my care of the patient were reviewed by me and considered in my medical decision making (see chart for details).     Patient is EKG without acute ischemic changes.  Continues with no chest pain here and patient started on nitroglycerin drip which did improve his symptoms.  Concern for possible aortic dissection and chest CT as well as abdominal CT negative.  Patient is pain-free at this time and will be admitted to the hospital service  CRITICAL CARE Performed by: Toy Baker Total critical care time: 50 minutes Critical care  time was exclusive of separately billable procedures and treating other patients. Critical care was necessary to treat or prevent imminent or life-threatening deterioration. Critical care was time spent personally by me on the following activities: development of treatment plan with patient and/or surrogate as well as nursing, discussions with consultants, evaluation of patient's response to treatment, examination of patient, obtaining history from patient or surrogate, ordering and performing treatments and interventions, ordering and review of laboratory studies, ordering and review of radiographic studies, pulse oximetry and re-evaluation of patient's condition.   Final Clinical Impressions(s) / ED Diagnoses   Final diagnoses:  None    ED Discharge Orders    None       Lorre Nick, MD 09/22/18 2005

## 2018-09-23 ENCOUNTER — Observation Stay (HOSPITAL_BASED_OUTPATIENT_CLINIC_OR_DEPARTMENT_OTHER): Payer: Medicare Other

## 2018-09-23 DIAGNOSIS — R079 Chest pain, unspecified: Secondary | ICD-10-CM

## 2018-09-23 DIAGNOSIS — F200 Paranoid schizophrenia: Secondary | ICD-10-CM | POA: Diagnosis not present

## 2018-09-23 DIAGNOSIS — R030 Elevated blood-pressure reading, without diagnosis of hypertension: Secondary | ICD-10-CM

## 2018-09-23 LAB — ECHOCARDIOGRAM COMPLETE
Height: 71 in
WEIGHTICAEL: 2960 [oz_av]

## 2018-09-23 LAB — TROPONIN I
Troponin I: <0.03 ng/mL (ref <0.03)
Troponin I: <0.03 ng/mL (ref <0.03)
Troponin I: <0.03 ng/mL (ref <0.03)

## 2018-09-23 LAB — HIV ANTIBODY (ROUTINE TESTING W REFLEX): HIV SCREEN 4TH GENERATION: NONREACTIVE

## 2018-09-23 MED ORDER — HYDRALAZINE HCL 20 MG/ML IJ SOLN: 10.0000 mg | INTRAMUSCULAR | Status: DC | PRN

## 2018-09-23 MED ORDER — AMLODIPINE BESYLATE 5 MG PO TABS: 5.0000 mg | ORAL_TABLET | Freq: Every day | ORAL | 0 refills | Status: DC

## 2018-09-23 MED ORDER — AMLODIPINE BESYLATE 5 MG PO TABS
5.0000 mg | ORAL_TABLET | Freq: Every day | ORAL | Status: DC
  Administered 2018-09-23: 5 mg via ORAL
  Filled 2018-09-23: qty 1

## 2018-09-23 NOTE — Progress Notes (Signed)
Pt alert and oriented in NAD. Pt verbalized understanding of discharge instructions. 

## 2018-09-23 NOTE — Progress Notes (Signed)
  Echocardiogram 2D Echocardiogram has been performed.  Damon [name] 09/23/2018, 8:59 AM

## 2018-09-23 NOTE — Discharge Summary (Signed)
Physician Discharge Summary  Damon [name] ZOX:[id number] DOB: [birth year] DOA: 09/22/2018  PCP: System, Pcp Not In-- patient not sure who his PCP is, off of [address]  Admit date: 09/22/2018 Discharge date: 09/23/2018  Admitted From: home Discharge disposition: home   Recommendations for Outpatient Follow-Up:   1. outpatient cardiology referral 2. Adjustment of BP medications 3. Smoking cessation 4. Outpatient follow up for renal artery defect seen on CT scan incidentally    Discharge Diagnosis:   Active Problems:   Schizophrenia, paranoid type (HCC)   Chest pain   Elevated BP without diagnosis of hypertension    Discharge Condition: Improved.  Diet recommendation: Low sodium, heart healthy.    Wound care: None.  Code status: Full.   History of Present Illness:   Damon [name] is a 52 y.o. male with medical history significant of schizophrenia and depression tobacco abuse in remission    Presented with sudden onset of left arm numbness/chest pain started after he was running. HE runs on regukar basis no prior chest pain. This time the air was cold. CP Did not improve with rest. The pain was continues all day. Felt like a rib pushing in on the left side below left pectoral muscle. Was worse with deep breaths and palpation  Associated with SOB and nausea.   yesterday had an ankle episode of syncope presented to his primary care office today and had an hour chest pain today, apparently EKG was done in the office and was worrisome for ST segment elevation V2 V4 EMS was called he received 324 of aspirin at a time 4 mg of morphine 1 nitro with minimal relief patient was endorsing shortness of breath and diaphoresis   Hospital Course by Problem:   Chest pain  -in the setting of elevated blood pressure as well as also reproducible by palpation -echo with normal wall motion and EF -patient also stated worse when breathing-- he was running outside in the cold  when it happened -CTA shows no PE/disection and no coronary artery calcifications . -CE negative -EKG stable  Elevated BP without diagnosis of hypertension -  -started norvasc, patient heart rate already in the 60s hold off on ACE inhibitor given abnormal renal artery on CTA will need outpatient follow-up  Schizophrenia, paranoid type (HCC) -  stable continue home medications  Patient stable for d/c and outpatient follow up for adjustments of medications   Medical Consultants:      Discharge Exam:   Vitals:   09/23/18 0500 09/23/18 0939  BP: (!) 146/91 (!) 135/97  Pulse: 72 70  Resp: 16 16  Temp: 98.8 F (37.1 C)   SpO2: 97% 99%   Vitals:   09/22/18 2210 09/23/18 0034 09/23/18 0500 09/23/18 0939  BP: (!) 162/96 (!) 142/90 (!) 146/91 (!) 135/97  Pulse: (!) 58 69 72 70  Resp: 16 18 16 16   Temp: 98.6 F (37 C) 97.9 F (36.6 C) 98.8 F (37.1 C)   TempSrc: Oral Oral Oral   SpO2: 97% 95% 97% 99%  Weight:      Height:        General exam: Appears calm and comfortable.   The results of significant diagnostics from this hospitalization (including imaging, microbiology, ancillary and laboratory) are listed below for reference.     Procedures and Diagnostic Studies:   Dg Chest 2 View  Result Date: 09/22/2018 CLINICAL DATA:  Chest pain EXAM: CHEST - 2 VIEW COMPARISON:  11/13/2014 FINDINGS: The heart  size and mediastinal contours are within normal limits. Both lungs are clear. The visualized skeletal structures are unremarkable. IMPRESSION: No active cardiopulmonary disease. Electronically Signed   By: Jasmine Pang M.D.   On: 09/22/2018 18:29   Ct Angio Chest/abd/pel For Dissection W And/or W/wo  Result Date: 09/22/2018 CLINICAL DATA:  Chest pain, acute, high probability, ACS suspected. Sudden onset of left arm numbness and chest pain while running. EXAM: CT ANGIOGRAPHY CHEST, ABDOMEN AND PELVIS TECHNIQUE: Multidetector CT imaging through the chest, abdomen and pelvis was  performed using the standard protocol during bolus administration of intravenous contrast. Multiplanar reconstructed images and MIPs were obtained and reviewed to evaluate the vascular anatomy. CONTRAST:  ISOVUE-370 IOPAMIDOL (ISOVUE-370) INJECTION 76% COMPARISON:  None. FINDINGS: CTA CHEST FINDINGS Cardiovascular: Noncontrast imaging of the chest demonstrates no coronary artery calcifications or atherosclerotic calcifications of the aorta. Arterial phase imaging demonstrates a normal appearance of the aortic arch and thoracic aorta. The pulmonary arteries are within normal limits. Mediastinum/Nodes: No significant mediastinal, axillary, or hilar adenopathy is present. Lungs/Pleura: The lungs are clear without focal nodule, mass, or airspace disease. No significant pleural or pericardial effusion is present. Musculoskeletal: Vertebral body heights and alignment are maintained. No focal lytic or blastic lesions are present. Ribs are unremarkable. Sternum is within normal limits. Clavicles are normal. Scapulae are within normal limits. Review of the MIP images confirms the above findings. CTA ABDOMEN AND PELVIS FINDINGS VASCULAR Aorta: Normal caliber aorta without aneurysm, dissection, vasculitis or significant stenosis. Celiac: Patent without evidence of aneurysm, dissection, vasculitis or significant stenosis. SMA: Patent without evidence of aneurysm, dissection, vasculitis or significant stenosis. Renals: Both renal arteries are patent. There is no focal stenosis or aneurysm. There is marked irregularity of the right renal artery suggesting fibromuscular dysplasia. More subtle irregularity is present of the superior left renal artery. There is a duplicated left renal artery feeding the lower pole. IMA: Patent without evidence of aneurysm, dissection, vasculitis or significant stenosis. Inflow: Patent without evidence of aneurysm, dissection, vasculitis or significant stenosis. Veins: No obvious venous  abnormality within the limitations of this arterial phase study. Review of the MIP images confirms the above findings. NON-VASCULAR Hepatobiliary: No focal liver abnormality is seen. No gallstones, gallbladder wall thickening, or biliary dilatation. Pancreas: Unremarkable. No pancreatic ductal dilatation or surrounding inflammatory changes. Spleen: Normal in size without focal abnormality. Adrenals/Urinary Tract: The adrenal glands are normal. Kidneys and ureters are within normal limits otherwise. The urinary bladder is unremarkable. Stomach/Bowel: Stomach and duodenum are within normal limits. The small bowel is normal. The terminal ileum is within normal limits. Appendix is visualized and normal. The ascending and transverse colon are normal. Descending and sigmoid colon are within normal limits. Lymphatic: No significant retroperitoneal adenopathy is present. Reproductive: Prostate is unremarkable. Other: No abdominal wall hernia or abnormality. No abdominopelvic ascites. Musculoskeletal: Vertebral body heights and alignment are normal. No focal lytic or blastic lesions are present. Pelvis is within normal limits. The hips are located and normal. Review of the MIP images confirms the above findings. IMPRESSION: 1. Normal CTA of the thoracic and abdominal aorta. No significant aneurysm or dissection. 2. Focal vessel irregularity involving the right greater than left renal arteries concerning for fibromuscular dysplasia. No vascular irregularity seen elsewhere. 3. CT of the chest is normal. 4. Abdomen and pelvis are otherwise unremarkable. Electronically Signed   By: Marin Roberts M.D.   On: 09/22/2018 19:15     Labs:   Basic Metabolic Panel: Recent Labs  Lab 09/22/18  1720  NA 136  K 3.8  CL 101  CO2 24  GLUCOSE 92  BUN 13  CREATININE 1.07  CALCIUM 9.8   GFR Estimated Creatinine Clearance: 86 mL/min (by C-G formula based on SCr of 1.07 mg/dL). Liver Function Tests: No results for  input(s): AST, ALT, ALKPHOS, BILITOT, PROT, ALBUMIN in the last 168 hours. No results for input(s): LIPASE, AMYLASE in the last 168 hours. No results for input(s): AMMONIA in the last 168 hours. Coagulation profile No results for input(s): INR, PROTIME in the last 168 hours.  CBC: Recent Labs  Lab 09/22/18 1720  WBC 8.5  HGB 13.4  HCT 40.7  MCV 89.8  PLT 347   Cardiac Enzymes: Recent Labs  Lab 09/22/18 2155 09/23/18 0015 09/23/18 0252 09/23/18 0615  TROPONINI <0.03 <0.03 <0.03 <0.03   BNP: Invalid input(s): POCBNP CBG: No results for input(s): GLUCAP in the last 168 hours. D-Dimer No results for input(s): DDIMER in the last 72 hours. Hgb A1c No results for input(s): HGBA1C in the last 72 hours. Lipid Profile No results for input(s): CHOL, HDL, LDLCALC, TRIG, CHOLHDL, LDLDIRECT in the last 72 hours. Thyroid function studies No results for input(s): TSH, T4TOTAL, T3FREE, THYROIDAB in the last 72 hours.  Invalid input(s): FREET3 Anemia work up No results for input(s): VITAMINB12, FOLATE, FERRITIN, TIBC, IRON, RETICCTPCT in the last 72 hours. Microbiology No results found for this or any previous visit (from the past 240 hour(s)).   Discharge Instructions:   Discharge Instructions    Diet - low sodium heart healthy   Complete by:  As directed    Increase activity slowly   Complete by:  As directed      Allergies as of 09/23/2018      Reactions   Spinach Hives      Medication List    STOP taking these medications   nicotine 7 mg/24hr patch Commonly known as:  NICODERM CQ - dosed in mg/24 hr     TAKE these medications   acetaminophen 500 MG tablet Commonly known as:  TYLENOL Take 1 tablet (500 mg total) by mouth every 6 (six) hours as needed for mild pain.   amLODipine 5 MG tablet Commonly known as:  NORVASC Take 1 tablet (5 mg total) by mouth daily. Start taking on:  09/24/2018   FLUoxetine 20 MG capsule Commonly known as:  PROZAC Take 1 capsule  (20 mg total) by mouth daily. For depression   risperiDONE 2 MG tablet Commonly known as:  RISPERDAL Take 1 tablet (2 mg total) by mouth 2 (two) times daily. For mood control   traZODone 50 MG tablet Commonly known as:  DESYREL Take 1 tablet (50 mg total) by mouth at bedtime as needed for sleep.         Time coordinating discharge: 25 min  Signed:  Joseph Art DO  [facility] 09/23/2018, 12:29 PM

## 2018-09-23 NOTE — Progress Notes (Signed)
2D Echo completed at bedside.

## 2018-09-29 DIAGNOSIS — Z6825 Body mass index (BMI) 25.0-25.9, adult: Secondary | ICD-10-CM | POA: Diagnosis not present

## 2018-09-29 DIAGNOSIS — M549 Dorsalgia, unspecified: Secondary | ICD-10-CM | POA: Diagnosis not present

## 2018-09-29 DIAGNOSIS — M79609 Pain in unspecified limb: Secondary | ICD-10-CM | POA: Diagnosis not present

## 2018-09-29 DIAGNOSIS — M545 Low back pain: Secondary | ICD-10-CM | POA: Diagnosis not present

## 2018-10-05 NOTE — Congregational Nurse Program (Signed)
States has been dx with paranoid Schizophrenia.  States is interested in counseling, group/indivdual therapy.  Also states is having difficulty managing at home remembering to take his medications and manage his affaires.  Referred to psychiatric therapeutics

## 2018-11-20 DIAGNOSIS — F251 Schizoaffective disorder, depressive type: Secondary | ICD-10-CM | POA: Diagnosis not present

## 2018-11-20 DIAGNOSIS — F332 Major depressive disorder, recurrent severe without psychotic features: Secondary | ICD-10-CM | POA: Diagnosis not present

## 2018-11-20 DIAGNOSIS — F209 Schizophrenia, unspecified: Secondary | ICD-10-CM | POA: Diagnosis not present

## 2018-11-23 ENCOUNTER — Other Ambulatory Visit: Payer: Self-pay | Admitting: Internal Medicine

## 2018-11-23 ENCOUNTER — Telehealth (HOSPITAL_COMMUNITY): Payer: Self-pay | Admitting: *Deleted

## 2018-11-23 ENCOUNTER — Other Ambulatory Visit (HOSPITAL_COMMUNITY): Payer: Self-pay | Admitting: Internal Medicine

## 2018-11-23 DIAGNOSIS — R0789 Other chest pain: Secondary | ICD-10-CM

## 2018-11-23 NOTE — Telephone Encounter (Signed)
Left message on voicemail in reference to upcoming appointment scheduled for 11/28/18 Phone number given for a call back so details instructions can be given.  Damon [name]

## 2018-11-28 ENCOUNTER — Encounter (HOSPITAL_COMMUNITY): Payer: Self-pay

## 2018-12-18 ENCOUNTER — Emergency Department (HOSPITAL_COMMUNITY)
Admission: EM | Admit: 2018-12-18 | Discharge: 2018-12-18 | Disposition: A | Discharge status: Home / Self Care | Payer: Medicare Other | Attending: Emergency Medicine | Admitting: Emergency Medicine

## 2018-12-18 ENCOUNTER — Other Ambulatory Visit: Payer: Self-pay

## 2018-12-18 ENCOUNTER — Encounter (HOSPITAL_COMMUNITY): Payer: Self-pay | Admitting: *Deleted

## 2018-12-18 DIAGNOSIS — F17220 Nicotine dependence, chewing tobacco, uncomplicated: Secondary | ICD-10-CM | POA: Insufficient documentation

## 2018-12-18 DIAGNOSIS — J111 Influenza due to unidentified influenza virus with other respiratory manifestations: Secondary | ICD-10-CM | POA: Insufficient documentation

## 2018-12-18 DIAGNOSIS — Z79899 Other long term (current) drug therapy: Secondary | ICD-10-CM | POA: Insufficient documentation

## 2018-12-18 DIAGNOSIS — R05 Cough: Secondary | ICD-10-CM | POA: Diagnosis present

## 2018-12-18 DIAGNOSIS — R69 Illness, unspecified: Secondary | ICD-10-CM

## 2018-12-18 MED ORDER — IBUPROFEN 800 MG PO TABS
800.0000 mg | ORAL_TABLET | Freq: Once | ORAL | Status: AC
  Administered 2018-12-18: 800 mg via ORAL
  Filled 2018-12-18: qty 1

## 2018-12-18 MED ORDER — ONDANSETRON 4 MG PO TBDP: 4.0000 mg | ORAL_TABLET | ORAL | 0 refills | Status: DC | PRN

## 2018-12-18 MED ORDER — ACETAMINOPHEN 500 MG PO TABS
1000.0000 mg | ORAL_TABLET | Freq: Once | ORAL | Status: AC
  Administered 2018-12-18: 1000 mg via ORAL
  Filled 2018-12-18: qty 2

## 2018-12-18 MED ORDER — HYDROCODONE-HOMATROPINE 5-1.5 MG/5ML PO SYRP: ORAL_SOLUTION | ORAL | 0 refills | Status: DC

## 2018-12-18 MED ORDER — ACETAMINOPHEN 500 MG PO TABS: 1000.0000 mg | ORAL_TABLET | Freq: Four times a day (QID) | ORAL | 0 refills | Status: DC | PRN

## 2018-12-18 MED ORDER — IBUPROFEN 800 MG PO TABS: 800.0000 mg | ORAL_TABLET | Freq: Three times a day (TID) | ORAL | 0 refills | Status: DC

## 2018-12-18 NOTE — ED Triage Notes (Signed)
Pt reports 3 days of coughing . Cough is productive and Pt reports gen. Body aches .

## 2018-12-18 NOTE — ED Provider Notes (Signed)
[facility] EMERGENCY DEPARTMENT Provider Note   CSN: [id number] Arrival date & time: 12/18/18  6144     History   Chief Complaint Chief Complaint  Patient presents with  . Cough    HPI Damon [name] is a 53 y.o. male.  HPI Symptoms for started Friday 3 days ago.  First symptom was coughing.  Patient then went on to develop nasal congestion, sore throat, frontal headache and generalized body pain.  He reports he has been getting fevers.  No nausea, vomiting or abdominal pain.  No diarrhea.  Patient reports he does feel fatigued and generally weak.  He denies he has any significant associated medical history.  Patient does not smoke or drink alcohol.  He reports he works in a [employer] operational [employer] or other people have been ill.  He reports that he has son also has cough and pinkeye. Past Medical History:  Diagnosis Date  . Depression   . Paranoid schizophrenia [facility])     Patient Active Problem List   Diagnosis Date Noted  . Chest pain 09/22/2018  . Elevated BP without diagnosis of hypertension 09/22/2018  . Schizophrenia, unspecified (HCC) 10/12/2017  . Schizophrenia (HCC) 10/10/2012  . Paranoid schizophrenia (HCC)   . Depression   . Schizophrenia, paranoid type (HCC) 11/12/2011    Class: Acute    History reviewed. No pertinent surgical history.      Home Medications    Prior to Admission medications   Medication Sig Start Date End Date Taking? Authorizing Provider  acetaminophen (TYLENOL) 500 MG tablet Take 1 tablet (500 mg total) by mouth every 6 (six) hours as needed for mild pain. 10/14/17   Armandina Stammer I, NP  acetaminophen (TYLENOL) 500 MG tablet Take 2 tablets (1,000 mg total) by mouth every 6 (six) hours as needed. 12/18/18   Arby Barrette, MD  amLODipine (NORVASC) 5 MG tablet Take 1 tablet (5 mg total) by mouth daily. 09/24/18   Joseph Art, DO  FLUoxetine (PROZAC) 20 MG capsule Take 1 capsule (20 mg total) by mouth daily. For  depression 10/15/17   Armandina Stammer I, NP  HYDROcodone-homatropine [facility]) 5-1.5 MG/5ML syrup 5-10 mL every 6 hours as needed for cough 12/18/18   Arby Barrette, MD  ibuprofen (ADVIL,MOTRIN) 800 MG tablet Take 1 tablet (800 mg total) by mouth 3 (three) times daily. 12/18/18   Arby Barrette, MD  ondansetron (ZOFRAN ODT) 4 MG disintegrating tablet Take 1 tablet (4 mg total) by mouth every 4 (four) hours as needed for nausea or vomiting. 12/18/18   Arby Barrette, MD  risperiDONE (RISPERDAL) 2 MG tablet Take 1 tablet (2 mg total) by mouth 2 (two) times daily. For mood control 10/14/17   Armandina Stammer I, NP  traZODone (DESYREL) 50 MG tablet Take 1 tablet (50 mg total) by mouth at bedtime as needed for sleep. 10/14/17   Sanjuana Kava, NP    Family History Family History  Problem Relation Age of Onset  . Colon cancer Mother   . Hyperlipidemia Father   . Heart failure Brother   . Diabetes Other   . Stroke Other     Social History Social History   Tobacco Use  . Smoking status: Former Smoker    Packs/day: 0.25    Types: Cigarettes  . Smokeless tobacco: Current User    Types: Chew  Substance Use Topics  . Alcohol use: Yes    Alcohol/week: 1.0 standard drinks    Types: 1 Cans of beer per  week    Comment: one can of beer weekly  . Drug use: No     Allergies   Spinach   Review of Systems Review of Systems 10 Systems reviewed and are negative for acute change except as noted in the HPI.   Physical Exam Updated Vital Signs BP (!) 158/113 (BP Location: Right Arm)   Pulse (!) 111   Temp (!) 101.1 F (38.4 C) (Oral)   Ht 5\' 11"  (1.803 m)   Wt 84.4 kg   SpO2 100%   BMI 25.94 kg/m   Physical Exam Constitutional:      Appearance: He is well-developed.     Comments: Alert and nontoxic.  No respiratory distress.  HENT:     Head: Normocephalic and atraumatic.     Right Ear: Tympanic membrane normal.     Left Ear: Tympanic membrane normal.     Nose: Nose normal.      Mouth/Throat:     Mouth: Mucous membranes are moist.  Eyes:     Conjunctiva/sclera: Conjunctivae normal.     Pupils: Pupils are equal, round, and reactive to light.  Neck:     Musculoskeletal: Neck supple.  Cardiovascular:     Rate and Rhythm: Regular rhythm.     Pulses: Normal pulses.     Heart sounds: Normal heart sounds.     Comments: Borderline tachycardia. Pulmonary:     Effort: Pulmonary effort is normal.     Breath sounds: Normal breath sounds.     Comments: No respiratory distress lung fields are clear. Abdominal:     General: Bowel sounds are normal. There is no distension.     Palpations: Abdomen is soft.     Tenderness: There is no abdominal tenderness.  Musculoskeletal: Normal range of motion.     Right lower leg: No edema.     Left lower leg: No edema.  Skin:    General: Skin is warm and dry.  Neurological:     General: No focal deficit present.     Mental Status: He is alert and oriented to person, place, and time.     GCS: GCS eye subscore is 4. GCS verbal subscore is 5. GCS motor subscore is 6.     Coordination: Coordination normal.  Psychiatric:        Mood and Affect: Mood normal.      ED Treatments / Results  Labs (all labs ordered are listed, but only abnormal results are displayed) Labs Reviewed - No data to display  EKG None  Radiology No results found.  Procedures Procedures (including critical care time)  Medications Ordered in ED Medications  ibuprofen (ADVIL,MOTRIN) tablet 800 mg (has no administration in time range)  acetaminophen (TYLENOL) tablet 1,000 mg (has no administration in time range)     Initial Impression / Assessment and Plan / ED Course  I have reviewed the triage vital signs and the nursing notes.  Pertinent labs & imaging results that were available during my care of the patient were reviewed by me and considered in my medical decision making (see chart for details).    Patient presents with symptoms very  suggestive of influenza.  Onset first with cough followed by fever and generalized body ache.  No vomiting or diarrhea.  Patient is not clinically dehydrated in appearance.  He is out of window for Tamiflu.  He does not have significant comorbid illness.  At this time I feel he is stable to initiate him somatic home treatment.  He is counseled on the use of ibuprofen and Tylenol as well as Hycodan for cough.  Patient is counseled for 2 days rest and return precautions reviewed.  Final Clinical Impressions(s) / ED Diagnoses   Final diagnoses:  Influenza-like illness    ED Discharge Orders         Ordered    ibuprofen (ADVIL,MOTRIN) 800 MG tablet  3 times daily     12/18/18 0731    acetaminophen (TYLENOL) 500 MG tablet  Every 6 hours PRN     12/18/18 0731    HYDROcodone-homatropine (HYCODAN) 5-1.5 MG/5ML syrup     12/18/18 0731    ondansetron (ZOFRAN ODT) 4 MG disintegrating tablet  Every 4 hours PRN     12/18/18 0731           Arby Barrette, MD 12/18/18 0740

## 2018-12-18 NOTE — Discharge Instructions (Signed)
1.  Take ibuprofen 800 mg 3 times a day with some food on your stomach for fever and body ache.  You may also take acetaminophen 1000 mg every 6 hours as needed for additional control of body ache and fever. 2.  Take 1 to 2 teaspoons of Hycodan syrup as prescribed for coughing.  This will help with symptoms.  You may be drowsy, do not try to drive or work while taking the medication. 3.  Stay well-hydrated and rest.  Try to avoid others and have good hand hygiene until you are free of fever for 24 hours.

## 2018-12-21 ENCOUNTER — Telehealth: Payer: Self-pay

## 2018-12-21 NOTE — Telephone Encounter (Signed)
New message    Just an FYI.  We have made several attempts to contact this patient including sending a letter to schedule or reschedule their MYOCARDIAL PERFUSION. We will be removing the patient from the WQ.   Thank you 

## 2019-02-07 DIAGNOSIS — F209 Schizophrenia, unspecified: Secondary | ICD-10-CM | POA: Diagnosis not present

## 2019-02-07 DIAGNOSIS — F332 Major depressive disorder, recurrent severe without psychotic features: Secondary | ICD-10-CM | POA: Diagnosis not present

## 2019-02-07 DIAGNOSIS — F251 Schizoaffective disorder, depressive type: Secondary | ICD-10-CM | POA: Diagnosis not present

## 2019-05-02 DIAGNOSIS — F251 Schizoaffective disorder, depressive type: Secondary | ICD-10-CM | POA: Diagnosis not present

## 2019-05-02 DIAGNOSIS — F209 Schizophrenia, unspecified: Secondary | ICD-10-CM | POA: Diagnosis not present

## 2019-05-02 DIAGNOSIS — F332 Major depressive disorder, recurrent severe without psychotic features: Secondary | ICD-10-CM | POA: Diagnosis not present

## 2019-08-09 DIAGNOSIS — F251 Schizoaffective disorder, depressive type: Secondary | ICD-10-CM | POA: Diagnosis not present

## 2019-08-09 DIAGNOSIS — F332 Major depressive disorder, recurrent severe without psychotic features: Secondary | ICD-10-CM | POA: Diagnosis not present

## 2019-08-09 DIAGNOSIS — F209 Schizophrenia, unspecified: Secondary | ICD-10-CM | POA: Diagnosis not present

## 2020-01-11 DIAGNOSIS — F332 Major depressive disorder, recurrent severe without psychotic features: Secondary | ICD-10-CM | POA: Diagnosis not present

## 2020-01-11 DIAGNOSIS — F209 Schizophrenia, unspecified: Secondary | ICD-10-CM | POA: Diagnosis not present

## 2020-01-11 DIAGNOSIS — F251 Schizoaffective disorder, depressive type: Secondary | ICD-10-CM | POA: Diagnosis not present

## 2020-04-11 DIAGNOSIS — F209 Schizophrenia, unspecified: Secondary | ICD-10-CM | POA: Diagnosis not present

## 2020-04-11 DIAGNOSIS — F251 Schizoaffective disorder, depressive type: Secondary | ICD-10-CM | POA: Diagnosis not present

## 2020-04-11 DIAGNOSIS — F332 Major depressive disorder, recurrent severe without psychotic features: Secondary | ICD-10-CM | POA: Diagnosis not present

## 2020-05-15 ENCOUNTER — Ambulatory Visit: Payer: Medicare Other | Attending: Internal Medicine

## 2020-05-15 DIAGNOSIS — Z23 Encounter for immunization: Secondary | ICD-10-CM

## 2020-05-15 NOTE — Progress Notes (Signed)
   Covid-19 Vaccination Clinic  Name:  Damon [name]    MRN: [id number] DOB: Dec 19, 1965  05/15/2020  [name] was observed post Covid-19 immunization for 15 minutes without incident. He was provided with Vaccine Information Sheet and instruction to access the V-Safe system.   [name] was instructed to call 911 with any severe reactions post vaccine: Marland Kitchen Difficulty breathing  . Swelling of face and throat  . A fast heartbeat  . A bad rash all over body  . Dizziness and weakness   Immunizations Administered    Name Date Dose VIS Date Route   JANSSEN COVID-19 VACCINE 05/15/2020  1:45 PM 0.5 mL 01/12/2020 Intramuscular   Manufacturer: [name]   Lot: [id number]   NDC: 64332-951-88      Covid-19 Vaccination Clinic  Name:  Damon [name]    MRN: [id number] DOB: 1966-10-29  05/15/2020  [name] was observed post Covid-19 immunization for 15 minutes without incident. He was provided with Vaccine Information Sheet and instruction to access the V-Safe system.   [name] was instructed to call 911 with any severe reactions post vaccine: Marland Kitchen Difficulty breathing  . Swelling of face and throat  . A fast heartbeat  . A bad rash all over body  . Dizziness and weakness   Immunizations Administered    Name Date Dose VIS Date Route   JANSSEN COVID-19 VACCINE 05/15/2020  1:45 PM 0.5 mL 01/12/2020 Intramuscular   Manufacturer: [name]   Lot: [id number]   NDC: 35573-220-25

## 2020-06-07 LAB — GLUCOSE, POCT (MANUAL RESULT ENTRY): POC Glucose: 146 mg/dl — AB (ref 70–99)

## 2020-08-01 DIAGNOSIS — F251 Schizoaffective disorder, depressive type: Secondary | ICD-10-CM | POA: Diagnosis not present

## 2020-08-01 DIAGNOSIS — F332 Major depressive disorder, recurrent severe without psychotic features: Secondary | ICD-10-CM | POA: Diagnosis not present

## 2020-08-01 DIAGNOSIS — F209 Schizophrenia, unspecified: Secondary | ICD-10-CM | POA: Diagnosis not present

## 2021-03-02 ENCOUNTER — Encounter: Payer: Self-pay | Admitting: *Deleted

## 2021-03-02 NOTE — Congregational Nurse Program (Signed)
  Dept: [phone]   Congregational Nurse Program Note  Date of Encounter: 03/02/2021  Past Medical History: Past Medical History:  Diagnosis Date  . Depression   . Paranoid schizophrenia (HCC)     Encounter Details:  CNP Questionnaire - 03/02/21 1037      Questionnaire   Do you give verbal consent to treat you today? Yes    Visit Setting Church or Organization    Location Patient Served At [facility]    Patient Status Not Applicable    Medical Provider Yes    Insurance Medicaid;Medicare    Intervention Educate;Support          Client came into office for a blood pressure check when offered. 152/98  Client reports he takes blood pressure medication and sees a doctor every three months. Medication taken last night. Verbal hypertension education provided. Offered to take blood pressure weekly and as needed to share with pt's doctor. Client is asymptomatic at this time. Velmer Broadfoot W RN CN [phone]

## 2021-04-03 ENCOUNTER — Other Ambulatory Visit: Payer: Self-pay | Admitting: Family

## 2021-04-03 ENCOUNTER — Ambulatory Visit
Admission: RE | Admit: 2021-04-03 | Discharge: 2021-04-03 | Disposition: A | Discharge status: Home / Self Care | Payer: Medicare Other | Source: Ambulatory Visit | Attending: Family | Admitting: Family

## 2021-04-03 DIAGNOSIS — M549 Dorsalgia, unspecified: Secondary | ICD-10-CM

## 2021-04-03 DIAGNOSIS — M546 Pain in thoracic spine: Secondary | ICD-10-CM

## 2021-04-18 ENCOUNTER — Encounter (HOSPITAL_COMMUNITY): Payer: Self-pay

## 2021-04-18 ENCOUNTER — Other Ambulatory Visit: Payer: Self-pay

## 2021-04-18 ENCOUNTER — Emergency Department (HOSPITAL_COMMUNITY): Payer: Medicare HMO

## 2021-04-18 ENCOUNTER — Emergency Department (HOSPITAL_COMMUNITY)
Admission: EM | Admit: 2021-04-18 | Discharge: 2021-04-18 | Disposition: A | Discharge status: Home / Self Care | Payer: Medicare HMO | Attending: Emergency Medicine | Admitting: Emergency Medicine

## 2021-04-18 DIAGNOSIS — R1084 Generalized abdominal pain: Secondary | ICD-10-CM

## 2021-04-18 DIAGNOSIS — Z87891 Personal history of nicotine dependence: Secondary | ICD-10-CM | POA: Insufficient documentation

## 2021-04-18 DIAGNOSIS — R112 Nausea with vomiting, unspecified: Secondary | ICD-10-CM

## 2021-04-18 LAB — COMPREHENSIVE METABOLIC PANEL
ALT: 24 U/L (ref 0–44)
AST: 28 U/L (ref 15–41)
Albumin: 4.3 g/dL (ref 3.5–5.0)
Alkaline Phosphatase: 48 U/L (ref 38–126)
Anion gap: 10 (ref 5–15)
BUN: 9 mg/dL (ref 6–20)
CO2: 25 mmol/L (ref 22–32)
Calcium: 9.5 mg/dL (ref 8.9–10.3)
Chloride: 103 mmol/L (ref 98–111)
Creatinine, Ser: 1.08 mg/dL (ref 0.61–1.24)
GFR, Estimated: >60 mL/min (ref >60)
Glucose, Bld: 91 mg/dL (ref 70–99)
Potassium: 3.9 mmol/L (ref 3.5–5.1)
Sodium: 138 mmol/L (ref 135–145)
Total Bilirubin: 0.8 mg/dL (ref 0.3–1.2)
Total Protein: 7.7 g/dL (ref 6.5–8.1)

## 2021-04-18 LAB — URINALYSIS, ROUTINE W REFLEX MICROSCOPIC
Bilirubin Urine: NEGATIVE
Glucose, UA: NEGATIVE mg/dL
Hgb urine dipstick: NEGATIVE
Ketones, ur: 5 mg/dL — AB
Leukocytes,Ua: NEGATIVE
Nitrite: NEGATIVE
Protein, ur: NEGATIVE mg/dL
Specific Gravity, Urine: 1.02 (ref 1.005–1.030)
pH: 7 (ref 5.0–8.0)

## 2021-04-18 LAB — CBC
HCT: 43 % (ref 39.0–52.0)
Hemoglobin: 14.4 g/dL (ref 13.0–17.0)
MCH: 30.6 pg (ref 26.0–34.0)
MCHC: 33.5 g/dL (ref 30.0–36.0)
MCV: 91.5 fL (ref 80.0–100.0)
Platelets: 378 10*3/uL (ref 150–400)
RBC: 4.7 MIL/uL (ref 4.22–5.81)
RDW: 12.7 % (ref 11.5–15.5)
WBC: 14.5 10*3/uL — ABNORMAL HIGH (ref 4.0–10.5)
nRBC: 0 % (ref 0.0–0.2)

## 2021-04-18 LAB — LIPASE, BLOOD: Lipase: 37 U/L (ref 11–51)

## 2021-04-18 MED ORDER — FENTANYL CITRATE (PF) 100 MCG/2ML IJ SOLN
50.0000 ug | Freq: Once | INTRAMUSCULAR | Status: AC
  Administered 2021-04-18: 50 ug via INTRAVENOUS
  Filled 2021-04-18: qty 2

## 2021-04-18 MED ORDER — SODIUM CHLORIDE 0.9 % IV BOLUS
500.0000 mL | Freq: Once | INTRAVENOUS | Status: AC
  Administered 2021-04-18: 500 mL via INTRAVENOUS

## 2021-04-18 MED ORDER — PROMETHAZINE HCL 25 MG PO TABS: 25.0000 mg | ORAL_TABLET | Freq: Four times a day (QID) | ORAL | 0 refills | Status: DC | PRN

## 2021-04-18 MED ORDER — IOHEXOL 300 MG/ML  SOLN
77.0000 mL | Freq: Once | INTRAMUSCULAR | Status: AC | PRN
  Administered 2021-04-18: 77 mL via INTRAVENOUS

## 2021-04-18 NOTE — ED Notes (Signed)
Discharge instructions reviewed and explained, pt verbalized understanding.  ?

## 2021-04-18 NOTE — ED Notes (Signed)
Patient transported to CT 

## 2021-04-18 NOTE — ED Provider Notes (Signed)
[facility] EMERGENCY DEPARTMENT Provider Note   CSN: [id number] Arrival date & time: 04/18/21  1226     History No chief complaint on file.   Damon [name] is a 55 y.o. male.  The history is provided by the patient and medical records.   Damon [name] is a 55 y.o. male who presents to the Emergency Department complaining of abdominal pain. He presents the emergency department by EMS from work for evaluation of abdominal pain that started last night. Pain is described as generalized in nature with associated nausea and dry heaves. No diarrhea but he is having bowel movements. He denies any fevers, difficulty breathing, dysuria. No known sick contacts. He does not smoke tobacco, use alcohol or use drugs. No prior similar symptoms. No prior abdominal surgeries.    Past Medical History:  Diagnosis Date  . Depression   . Paranoid schizophrenia [facility])     Patient Active Problem List   Diagnosis Date Noted  . Chest pain 09/22/2018  . Elevated BP without diagnosis of hypertension 09/22/2018  . Schizophrenia, unspecified (HCC) 10/12/2017  . Schizophrenia (HCC) 10/10/2012  . Paranoid schizophrenia (HCC)   . Depression   . Schizophrenia, paranoid type (HCC) 11/12/2011    Class: Acute    History reviewed. No pertinent surgical history.     Family History  Problem Relation Age of Onset  . Colon cancer Mother   . Hyperlipidemia Father   . Heart failure Brother   . Diabetes Other   . Stroke Other     Social History   Tobacco Use  . Smoking status: Former Smoker    Packs/day: 0.25    Types: Cigarettes  . Smokeless tobacco: Current User    Types: Chew  Substance Use Topics  . Alcohol use: Yes    Alcohol/week: 1.0 standard drink    Types: 1 Cans of beer per week    Comment: one can of beer weekly  . Drug use: No    Home Medications Prior to Admission medications   Medication Sig Start Date End Date Taking? Authorizing Provider  promethazine  (PHENERGAN) 25 MG tablet Take 1 tablet (25 mg total) by mouth every 6 (six) hours as needed for nausea or vomiting. 04/18/21  Yes Tilden Fossa, MD  acetaminophen (TYLENOL) 500 MG tablet Take 1 tablet (500 mg total) by mouth every 6 (six) hours as needed for mild pain. 10/14/17   Armandina Stammer I, NP  acetaminophen (TYLENOL) 500 MG tablet Take 2 tablets (1,000 mg total) by mouth every 6 (six) hours as needed. 12/18/18   Arby Barrette, MD  amLODipine (NORVASC) 5 MG tablet Take 1 tablet (5 mg total) by mouth daily. 09/24/18   Joseph Art, DO  FLUoxetine (PROZAC) 20 MG capsule Take 1 capsule (20 mg total) by mouth daily. For depression 10/15/17   Armandina Stammer I, NP  HYDROcodone-homatropine [facility]) 5-1.5 MG/5ML syrup 5-10 mL every 6 hours as needed for cough 12/18/18   Arby Barrette, MD  ibuprofen (ADVIL,MOTRIN) 800 MG tablet Take 1 tablet (800 mg total) by mouth 3 (three) times daily. 12/18/18   Arby Barrette, MD  ondansetron (ZOFRAN ODT) 4 MG disintegrating tablet Take 1 tablet (4 mg total) by mouth every 4 (four) hours as needed for nausea or vomiting. 12/18/18   Arby Barrette, MD  risperiDONE (RISPERDAL) 2 MG tablet Take 1 tablet (2 mg total) by mouth 2 (two) times daily. For mood control 10/14/17   Armandina Stammer I, NP  traZODone (  DESYREL) 50 MG tablet Take 1 tablet (50 mg total) by mouth at bedtime as needed for sleep. 10/14/17   Sanjuana Kava, NP    Allergies    Spinach and Zofran [ondansetron]  Review of Systems   Review of Systems  All other systems reviewed and are negative.   Physical Exam Updated Vital Signs BP (!) 173/99   Pulse 80   Temp 98.2 F (36.8 C)   Resp 18   Ht 5\' 11"  (1.803 m)   Wt 84.4 kg   SpO2 97%   BMI 25.94 kg/m   Physical Exam Vitals and nursing note reviewed.  Constitutional:      Appearance: He is well-developed.  HENT:     Head: Normocephalic and atraumatic.  Cardiovascular:     Rate and Rhythm: Normal rate and regular rhythm.  Pulmonary:      Effort: Pulmonary effort is normal. No respiratory distress.  Abdominal:     Palpations: Abdomen is soft.     Tenderness: There is abdominal tenderness. There is no guarding or rebound.     Comments: Moderate generalized abdominal tenderness, greatest over the left upper and left lower quadrant.  Musculoskeletal:        General: No swelling or tenderness.  Skin:    General: Skin is warm and dry.  Neurological:     Mental Status: He is alert and oriented to person, place, and time.  Psychiatric:        Behavior: Behavior normal.     ED Results / Procedures / Treatments   Labs (all labs ordered are listed, but only abnormal results are displayed) Labs Reviewed  CBC - Abnormal; Notable for the following components:      Result Value   WBC 14.5 (*)    All other components within normal limits  URINALYSIS, ROUTINE W REFLEX MICROSCOPIC - Abnormal; Notable for the following components:   Ketones, ur 5 (*)    All other components within normal limits  LIPASE, BLOOD  COMPREHENSIVE METABOLIC PANEL    EKG None  Radiology CT Abdomen Pelvis W Contrast  Result Date: 04/18/2021 CLINICAL DATA:  Abdominal pain, acute. Lower abdominal pain with nausea since last night. EXAM: CT ABDOMEN AND PELVIS WITH CONTRAST TECHNIQUE: Multidetector CT imaging of the abdomen and pelvis was performed using the standard protocol following bolus administration of intravenous contrast. CONTRAST:  5mL OMNIPAQUE IOHEXOL 300 MG/ML  SOLN COMPARISON:  CT abdomen dated 09/22/2018. FINDINGS: Lower chest: No acute abnormality. Hepatobiliary: No focal liver abnormality is seen. Gallbladder appears normal. No bile duct dilatation is seen. Pancreas: Unremarkable. No pancreatic ductal dilatation or surrounding inflammatory changes. Spleen: Normal in size without focal abnormality. Adrenals/Urinary Tract: Adrenal glands are unremarkable. Kidneys are unremarkable without mass, stone or hydronephrosis. No ureteral or bladder  calculi are identified. Bladder is unremarkable. Stomach/Bowel: No dilated large or small bowel loops. Scattered mild diverticulosis of the descending and sigmoid colon but no focal inflammatory change to suggest acute diverticulitis. Appendix is normal. No evidence of bowel wall inflammation. Stomach is unremarkable. Fluid is seen within the small bowel. Vascular/Lymphatic: No significant vascular findings are present. No enlarged abdominal or pelvic lymph nodes. Reproductive: Prostate is unremarkable. Other: No free fluid or abscess collection. No free intraperitoneal air. Musculoskeletal: Small supraumbilical abdominal wall hernia which contains fat only. Osseous structures are unremarkable. IMPRESSION: 1. Fluid is seen within the small bowel. This can be a secondary sign of a mild enteritis of infectious or inflammatory nature. No bowel obstruction or  evidence of bowel wall inflammation. 2. Mild colonic diverticulosis without evidence of acute diverticulitis. 3. Small supraumbilical abdominal wall hernia which contains fat only. 4. Remainder of the abdomen and pelvis CT is unremarkable. No evidence of acute solid organ abnormality. No renal or ureteral calculi. Appendix is normal. Electronically Signed   By: Bary Richard M.D.   On: 04/18/2021 17:49    Procedures Procedures   Medications Ordered in ED Medications  fentaNYL (SUBLIMAZE) injection 50 mcg (50 mcg Intravenous Given 04/18/21 1637)  sodium chloride 0.9 % bolus 500 mL (0 mLs Intravenous Stopped 04/18/21 1713)  iohexol (OMNIPAQUE) 300 MG/ML solution 77 mL (77 mLs Intravenous Contrast Given 04/18/21 1736)    ED Course  I have reviewed the triage vital signs and the nursing notes.  Pertinent labs & imaging results that were available during my care of the patient were reviewed by me and considered in my medical decision making (see chart for details).    MDM Rules/Calculators/A&P                         patient here for evaluation of  abdominal pain, vomiting. He has tenderness on examination without peritoneal findings. CBC was leukocytosis. CT scan is negative for diverticulitis, appendicitis. CT scan does demonstrate hernia as well as enteritis. Hernia does not appear incarcerated on CT scan or clinically. Patient symptom significantly improved on recheck in the department. Discussed with patient home care for abdominal pain, vomiting. Discussed outpatient follow-up and return precautions.   Final Clinical Impression(s) / ED Diagnoses Final diagnoses:  Generalized abdominal pain  Non-intractable vomiting with nausea, unspecified vomiting type    Rx / DC Orders ED Discharge Orders         Ordered    promethazine (PHENERGAN) 25 MG tablet  Every 6 hours PRN        04/18/21 1805           Tilden Fossa, MD 04/18/21 1810

## 2021-04-18 NOTE — ED Triage Notes (Signed)
Patient arrived by [employer] from his place of work. Developed abdominal pain with vomiting and diarrhea that started last night. Patient was administered zofran and developed rash and hives-had to receive benadryl prior to arrival and hives have resolved.

## 2021-04-18 NOTE — ED Notes (Signed)
Pt returned from CT °

## 2021-05-13 ENCOUNTER — Encounter: Payer: Self-pay | Admitting: *Deleted

## 2021-05-13 NOTE — Congregational Nurse Program (Signed)
  Dept: [phone]   Congregational Nurse Program Note  Date of Encounter: 05/13/2021  Past Medical History: Past Medical History:  Diagnosis Date   Depression    Paranoid schizophrenia (HCC)     Encounter Details:  CNP Questionnaire - 05/13/21 1226       Questionnaire   Do you give verbal consent to treat you today? Yes    Visit Setting Church or Organization    Location Patient Served At [facility]    Patient Status Not Applicable    Medical Provider Yes    Insurance Medicaid;Medicare    Intervention Assess (including screenings);Support            Client registered to participate in fitness class at [facility].

## 2021-09-15 ENCOUNTER — Emergency Department (HOSPITAL_COMMUNITY)
Admission: EM | Admit: 2021-09-15 | Discharge: 2021-09-15 | Disposition: A | Discharge status: Home / Self Care | Payer: 59 | Attending: Emergency Medicine | Admitting: Emergency Medicine

## 2021-09-15 ENCOUNTER — Other Ambulatory Visit: Payer: Self-pay

## 2021-09-15 ENCOUNTER — Emergency Department (HOSPITAL_COMMUNITY): Payer: 59

## 2021-09-15 DIAGNOSIS — R079 Chest pain, unspecified: Secondary | ICD-10-CM | POA: Diagnosis present

## 2021-09-15 DIAGNOSIS — Z20822 Contact with and (suspected) exposure to covid-19: Secondary | ICD-10-CM | POA: Diagnosis not present

## 2021-09-15 DIAGNOSIS — I10 Essential (primary) hypertension: Secondary | ICD-10-CM | POA: Diagnosis not present

## 2021-09-15 DIAGNOSIS — Z79899 Other long term (current) drug therapy: Secondary | ICD-10-CM | POA: Diagnosis not present

## 2021-09-15 DIAGNOSIS — Z87891 Personal history of nicotine dependence: Secondary | ICD-10-CM | POA: Insufficient documentation

## 2021-09-15 DIAGNOSIS — R55 Syncope and collapse: Secondary | ICD-10-CM | POA: Insufficient documentation

## 2021-09-15 DIAGNOSIS — Y904 Blood alcohol level of 80-99 mg/100 ml: Secondary | ICD-10-CM | POA: Diagnosis not present

## 2021-09-15 LAB — RAPID URINE DRUG SCREEN, HOSP PERFORMED
Amphetamines: NOT DETECTED
Barbiturates: NOT DETECTED
Benzodiazepines: NOT DETECTED
Cocaine: NOT DETECTED
Opiates: NOT DETECTED
Tetrahydrocannabinol: NOT DETECTED

## 2021-09-15 LAB — CBC WITH DIFFERENTIAL/PLATELET
Abs Immature Granulocytes: 0.02 10*3/uL (ref 0.00–0.07)
Basophils Absolute: 0 10*3/uL (ref 0.0–0.1)
Basophils Relative: 0 %
Eosinophils Absolute: 0 10*3/uL (ref 0.0–0.5)
Eosinophils Relative: 1 %
HCT: 40.1 % (ref 39.0–52.0)
Hemoglobin: 13.1 g/dL (ref 13.0–17.0)
Immature Granulocytes: 0 %
Lymphocytes Relative: 37 %
Lymphs Abs: 2.1 10*3/uL (ref 0.7–4.0)
MCH: 30.1 pg (ref 26.0–34.0)
MCHC: 32.7 g/dL (ref 30.0–36.0)
MCV: 92.2 fL (ref 80.0–100.0)
Monocytes Absolute: 0.4 10*3/uL (ref 0.1–1.0)
Monocytes Relative: 7 %
Neutro Abs: 3.1 10*3/uL (ref 1.7–7.7)
Neutrophils Relative %: 55 %
Platelets: 327 10*3/uL (ref 150–400)
RBC: 4.35 MIL/uL (ref 4.22–5.81)
RDW: 12.4 % (ref 11.5–15.5)
WBC: 5.7 10*3/uL (ref 4.0–10.5)
nRBC: 0 % (ref 0.0–0.2)

## 2021-09-15 LAB — URINALYSIS, ROUTINE W REFLEX MICROSCOPIC
Bilirubin Urine: NEGATIVE
Glucose, UA: NEGATIVE mg/dL
Hgb urine dipstick: NEGATIVE
Ketones, ur: 5 mg/dL — AB
Leukocytes,Ua: NEGATIVE
Nitrite: NEGATIVE
Protein, ur: NEGATIVE mg/dL
Specific Gravity, Urine: 1.016 (ref 1.005–1.030)
pH: 6 (ref 5.0–8.0)

## 2021-09-15 LAB — COMPREHENSIVE METABOLIC PANEL
ALT: 23 U/L (ref 0–44)
AST: 25 U/L (ref 15–41)
Albumin: 3.8 g/dL (ref 3.5–5.0)
Alkaline Phosphatase: 43 U/L (ref 38–126)
Anion gap: 11 (ref 5–15)
BUN: 9 mg/dL (ref 6–20)
CO2: 22 mmol/L (ref 22–32)
Calcium: 8.8 mg/dL — ABNORMAL LOW (ref 8.9–10.3)
Chloride: 105 mmol/L (ref 98–111)
Creatinine, Ser: 0.96 mg/dL (ref 0.61–1.24)
GFR, Estimated: >60 mL/min (ref >60)
Glucose, Bld: 119 mg/dL — ABNORMAL HIGH (ref 70–99)
Potassium: 3.7 mmol/L (ref 3.5–5.1)
Sodium: 138 mmol/L (ref 135–145)
Total Bilirubin: 0.7 mg/dL (ref 0.3–1.2)
Total Protein: 6.8 g/dL (ref 6.5–8.1)

## 2021-09-15 LAB — TROPONIN I (HIGH SENSITIVITY)
Troponin I (High Sensitivity): 8 ng/L (ref <18)
Troponin I (High Sensitivity): 10 ng/L (ref <18)

## 2021-09-15 LAB — ETHANOL: Alcohol, Ethyl (B): 91 mg/dL — ABNORMAL HIGH (ref <10)

## 2021-09-15 LAB — D-DIMER, QUANTITATIVE: D-Dimer, Quant: <0.27 ug/mL-FEU (ref 0.00–0.50)

## 2021-09-15 LAB — RESP PANEL BY RT-PCR (FLU A&B, COVID) ARPGX2
Influenza A by PCR: NEGATIVE
Influenza B by PCR: NEGATIVE
SARS Coronavirus 2 by RT PCR: NEGATIVE

## 2021-09-15 LAB — CBG MONITORING, ED: Glucose-Capillary: 109 mg/dL — ABNORMAL HIGH (ref 70–99)

## 2021-09-15 MED ORDER — AMLODIPINE BESYLATE 5 MG PO TABS
5.0000 mg | ORAL_TABLET | Freq: Once | ORAL | Status: AC
  Administered 2021-09-15: 5 mg via ORAL
  Filled 2021-09-15: qty 1

## 2021-09-15 MED ORDER — ACETAMINOPHEN 500 MG PO TABS
1000.0000 mg | ORAL_TABLET | Freq: Once | ORAL | Status: AC
  Administered 2021-09-15: 1000 mg via ORAL
  Filled 2021-09-15: qty 2

## 2021-09-15 MED ORDER — AMLODIPINE BESYLATE 10 MG PO TABS: 10.0000 mg | ORAL_TABLET | Freq: Every day | ORAL | 1 refills | Status: DC

## 2021-09-15 NOTE — ED Notes (Signed)
Pt sitting up in stretcher on cell phone, denies further syncopal episodes, no distress noted. Call light in reach. Second troponin sent.

## 2021-09-15 NOTE — ED Notes (Signed)
Pt able to void in urinal to provide specimen, RN sent to lab. Xray at bedside.

## 2021-09-15 NOTE — ED Notes (Signed)
Pt verbalized understanding of d/c instructions, meds and followup care. Denies questions. VSS, no distress noted. Steady gait to exit with all belongings.  ?

## 2021-09-15 NOTE — ED Notes (Signed)
Pt sitting up talking to RN, no distress noted, GCS 15, resp regular and airway patent. Pt reports head and chest pain.

## 2021-09-15 NOTE — ED Triage Notes (Signed)
Pt BIB [facility], was at [facility] this AM and pt called 911 for dizziness/"blacking out" and "head pounding." EMS reports pt initially talking and responsive, but several syncopal episodes noted. On arrival pt was unresponsive and EMS was bagging via BVM. Pt returned to spontaneous breathing and communicating, then had one more witnessed syncopal episode in front of ED staff and MD. Pt GCS 15 at this time, managing airway without difficulty.

## 2021-09-15 NOTE — ED Provider Notes (Signed)
[facility] EMERGENCY DEPARTMENT Provider Note   CSN: [id number] Arrival date & time:        History Chief Complaint  Patient presents with   Loss of Consciousness    Damon [name] is a 55 y.o. male.  Patient is a 55 year old male with a history of schizophrenia, depression, hypertension who is presenting today with EMS for chest pain, shortness of breath and unresponsive episodes.  Patient reports for the last few days he has had episodes of feeling nauseated and had a headache however today when he got to [employer] right before he got something to eat he started having chest pain that made his left arm feel tingly.  He felt like his vision will go blurry and he did feel lightheaded.  He also felt short of breath.  He denies any recent fever, cough, vomiting or diarrhea.  He has been eating and drinking normally.  He takes no medications and denies any over-the-counter medications except for Advil which he took at 4 AM this morning for the headache.  No one else in his home has been ill recently.  He denies any recent immobilizations, surgeries or unilateral leg pain or swelling.  EMS reports that initially when they got there patient had his head down on the table was complaining of chest pain or shortness of breath and then became unresponsive where he appeared to go apneic.  They reported during these episodes his heart rate did not change and his oxygen saturations did not change but they were bagging him and the episodes would last a minute or 2 and then he would become fully awake again.  They did not note any shaking or tremors.  The history is provided by the patient.  Loss of Consciousness     Past Medical History:  Diagnosis Date   Depression    Paranoid schizophrenia [facility])     Patient Active Problem List   Diagnosis Date Noted   Chest pain 09/22/2018   Elevated BP without diagnosis of hypertension 09/22/2018   Schizophrenia, unspecified (HCC)  10/12/2017   Schizophrenia (HCC) 10/10/2012   Paranoid schizophrenia (HCC)    Depression    Schizophrenia, paranoid type (HCC) 11/12/2011    Class: Acute    No past surgical history on file.     Family History  Problem Relation Age of Onset   Colon cancer Mother    Hyperlipidemia Father    Heart failure Brother    Diabetes Other    Stroke Other     Social History   Tobacco Use   Smoking status: Former    Packs/day: 0.25    Types: Cigarettes   Smokeless tobacco: Current    Types: Chew  Substance Use Topics   Alcohol use: Yes    Alcohol/week: 1.0 standard drink    Types: 1 Cans of beer per week    Comment: one can of beer weekly   Drug use: No    Home Medications Prior to Admission medications   Medication Sig Start Date End Date Taking? Authorizing Provider  acetaminophen (TYLENOL) 500 MG tablet Take 1 tablet (500 mg total) by mouth every 6 (six) hours as needed for mild pain. 10/14/17   Armandina Stammer I, NP  acetaminophen (TYLENOL) 500 MG tablet Take 2 tablets (1,000 mg total) by mouth every 6 (six) hours as needed. 12/18/18   Arby Barrette, MD  amLODipine (NORVASC) 5 MG tablet Take 1 tablet (5 mg total) by mouth daily. 09/24/18  Joseph Art, DO  FLUoxetine (PROZAC) 20 MG capsule Take 1 capsule (20 mg total) by mouth daily. For depression 10/15/17   Armandina Stammer I, NP  HYDROcodone-homatropine [facility]) 5-1.5 MG/5ML syrup 5-10 mL every 6 hours as needed for cough 12/18/18   Arby Barrette, MD  ibuprofen (ADVIL,MOTRIN) 800 MG tablet Take 1 tablet (800 mg total) by mouth 3 (three) times daily. 12/18/18   Arby Barrette, MD  ondansetron (ZOFRAN ODT) 4 MG disintegrating tablet Take 1 tablet (4 mg total) by mouth every 4 (four) hours as needed for nausea or vomiting. 12/18/18   Arby Barrette, MD  promethazine (PHENERGAN) 25 MG tablet Take 1 tablet (25 mg total) by mouth every 6 (six) hours as needed for nausea or vomiting. 04/18/21   Tilden Fossa, MD  risperiDONE  (RISPERDAL) 2 MG tablet Take 1 tablet (2 mg total) by mouth 2 (two) times daily. For mood control 10/14/17   Armandina Stammer I, NP  traZODone (DESYREL) 50 MG tablet Take 1 tablet (50 mg total) by mouth at bedtime as needed for sleep. 10/14/17   Sanjuana Kava, NP    Allergies    Spinach and Zofran [ondansetron]  Review of Systems   Review of Systems  Cardiovascular:  Positive for syncope.  All other systems reviewed and are negative.  Physical Exam Updated Vital Signs BP (!) 173/106 (BP Location: Right Arm)   Pulse 91   Temp 97.7 F (36.5 C) (Oral)   Resp (!) 27   Ht 5\' 11"  (1.803 m)   Wt 86.2 kg   SpO2 100%   BMI 26.50 kg/m   Physical Exam Vitals and nursing note reviewed.  Constitutional:      General: He is not in acute distress.    Appearance: He is well-developed.  HENT:     Head: Normocephalic and atraumatic.  Eyes:     Conjunctiva/sclera: Conjunctivae normal.     Pupils: Pupils are equal, round, and reactive to light.  Cardiovascular:     Rate and Rhythm: Normal rate and regular rhythm.     Pulses: Normal pulses.     Heart sounds: No murmur heard. Pulmonary:     Effort: Pulmonary effort is normal. No respiratory distress.     Breath sounds: Normal breath sounds. No wheezing or rales.  Chest:     Chest wall: No tenderness.  Abdominal:     General: There is no distension.     Palpations: Abdomen is soft.     Tenderness: There is no abdominal tenderness. There is no guarding or rebound.  Musculoskeletal:        General: No tenderness. Normal range of motion.     Cervical back: Normal range of motion and neck supple.     Right lower leg: No edema.     Left lower leg: No edema.  Skin:    General: Skin is warm and dry.     Findings: No erythema or rash.  Neurological:     Mental Status: He is alert and oriented to person, place, and time. Mental status is at baseline.     Sensory: No sensory deficit.     Motor: No weakness.  Psychiatric:        Behavior:  Behavior normal.     Comments: Calm and cooperative.  Able to answer all questions.  Does not appear to be responding to internal stimuli.    ED Results / Procedures / Treatments   Labs (all labs ordered are listed, but only  abnormal results are displayed) Labs Reviewed  URINALYSIS, ROUTINE W REFLEX MICROSCOPIC - Abnormal; Notable for the following components:      Result Value   Ketones, ur 5 (*)    All other components within normal limits  COMPREHENSIVE METABOLIC PANEL - Abnormal; Notable for the following components:   Glucose, Bld 119 (*)    Calcium 8.8 (*)    All other components within normal limits  ETHANOL - Abnormal; Notable for the following components:   Alcohol, Ethyl (B) 91 (*)    All other components within normal limits  CBG MONITORING, ED - Abnormal; Notable for the following components:   Glucose-Capillary 109 (*)    All other components within normal limits  RESP PANEL BY RT-PCR (FLU A&B, COVID) ARPGX2  CBC WITH DIFFERENTIAL/PLATELET  RAPID URINE DRUG SCREEN, HOSP PERFORMED  D-DIMER, QUANTITATIVE  TROPONIN I (HIGH SENSITIVITY)  TROPONIN I (HIGH SENSITIVITY)    EKG EKG Interpretation  Date/Time:  Tuesday September 15 2021 07:57:24 EDT Ventricular Rate:  88 PR Interval:  177 QRS Duration: 79 QT Interval:  296 QTC Calculation: 358 R Axis:   63 Text Interpretation: Sinus rhythm Left ventricular hypertrophy Minimal ST elevation, inferior leads ST elevation in Anterior leads more pronounced than prior tracings Confirmed by Gwyneth Sprout ([id number]) on 09/15/2021 8:20:35 AM  Radiology DG Chest Port 1 View  Result Date: 09/15/2021 CLINICAL DATA:  Chest pain and loss of consciousness EXAM: PORTABLE CHEST 1 VIEW COMPARISON:  09/22/2018 FINDINGS: Numerous leads and wires project over the chest. Midline trachea. Normal heart size. No pleural effusion or pneumothorax. Clear lungs. IMPRESSION: Normal chest. Electronically Signed   By: Jeronimo Greaves M.D.   On: 09/15/2021  08:19    Procedures Procedures   Medications Ordered in ED Medications  amLODipine (NORVASC) tablet 5 mg (has no administration in time range)  acetaminophen (TYLENOL) tablet 1,000 mg (1,000 mg Oral Given 09/15/21 6213)    ED Course  I have reviewed the triage vital signs and the nursing notes.  Pertinent labs & imaging results that were available during my care of the patient were reviewed by me and considered in my medical decision making (see chart for details).    MDM Rules/Calculators/A&P                           Patient is a 55 year old male presenting today with complaints of chest pain, shortness of breath and unresponsive episodes.  Patient did have 1 of these episodes while he was being moved from the stretcher to the bed in the emergency room.  He appeared to stop breathing completely but it resolved immediately when he was swabbed for COVID.  He was not on the monitor at the time to see if there is any change in oxygen saturation or change in heart rhythm.  Patient is currently in sinus rhythm and does have EKG changes consistent with LVH and more pronounced ST elevation anteriorly compared to his EKG in 2019.  He has not had cough or fever but has been complaining of headache and nausea over the last few days.  Concern for possible PE, NSTEMI, hypertensive urgency.  Patient denies any alcohol or drug use.  Blood sugar is within normal limits.  Low suspicion for toxic metabolic causes.  Patient was seen in 2019 for chest pain and had a rule out and was treated with amlodipine for hypertension but he did not continue that medication.  He has no history  of COPD or asthma.  He is not wheezing on exam.  9:40 AM Patient's initial labs are within normal limits except for an alcohol level of 91.  Chest x-ray without acute findings.  Patient was given amlodipine for his blood pressure and he has had no further episodes of being unresponsive.  Initial troponin was normal but we will do a  delta.  7:29 AM Delta trop was neg.  Pt reports no further chest pain just mild headache.  Will get him back on bp meds.  Given return precautions.  MDM   Amount and/or Complexity of Data Reviewed Clinical lab tests: ordered and reviewed Tests in the radiology section of CPT: ordered and reviewed Tests in the medicine section of CPT: ordered and reviewed Independent visualization of images, tracings, or specimens: yes     Final Clinical Impression(s) / ED Diagnoses Final diagnoses:  Hypertension, unspecified type  Nonspecific chest pain    Rx / DC Orders ED Discharge Orders          Ordered    amLODipine (NORVASC) 10 MG tablet  Daily        09/15/21 1211             Gwyneth Sprout, MD 09/16/21 0730

## 2021-09-15 NOTE — Discharge Instructions (Signed)
Your blood pressure was high today which is probably why you are having the headaches.  Everything with your heart looks normal.  Your kidneys also look good today and no signs of infection.  You were given a new prescription for blood pressure medication and it was sent to [place] at [facility].  You will take that medicine every day.  It will be important for you to follow-up with your doctor within the next month just to make sure the medication is working.  If you start feeling worse, passing out, severe shortness of breath or chest pain you should return to the emergency room.

## 2022-03-10 ENCOUNTER — Ambulatory Visit (HOSPITAL_COMMUNITY)
Admission: EM | Admit: 2022-03-10 | Discharge: 2022-03-10 | Disposition: A | Discharge status: Home / Self Care | Payer: Medicare Other | Attending: Nurse Practitioner | Admitting: Nurse Practitioner

## 2022-03-10 DIAGNOSIS — H0014 Chalazion left upper eyelid: Secondary | ICD-10-CM | POA: Diagnosis not present

## 2022-03-10 DIAGNOSIS — R030 Elevated blood-pressure reading, without diagnosis of hypertension: Secondary | ICD-10-CM | POA: Diagnosis not present

## 2022-03-10 DIAGNOSIS — H01004 Unspecified blepharitis left upper eyelid: Secondary | ICD-10-CM | POA: Diagnosis not present

## 2022-03-10 MED ORDER — ERYTHROMYCIN 5 MG/GM OP OINT: TOPICAL_OINTMENT | Freq: Every day | OPHTHALMIC | 0 refills | Status: AC

## 2022-03-10 NOTE — Discharge Instructions (Addendum)
-   Please use the ointment in your eye at nighttime for 7 days to help with the redness and irritation ?-Please follow-up with Dr. Darcel Bayley to discuss management of the bump on your left eye ?-Please continue warm compresses 4 times daily until you see an eye specialist ?-Please follow-up with your primary care provider to discuss elevated blood pressure ? ?

## 2022-03-10 NOTE — ED Provider Notes (Addendum)
?[facility] ? ? ? ?CSN: [id number] ?Arrival date & time: 03/10/22  [id number] ? ? ?  ? ?History   ?Chief Complaint ?Chief Complaint  ?Patient presents with  ? Eye Injury  ? ? ?HPI ?Damon [name] is a 56 y.o. male.  ? ?Patient reports mild left eye pain, swelling, and some redness for the past 30 days.  Denies any foreign body sensation, use of contacts, photophobia or visual changes.  Denies any discharge from his eyes, crusting or matting of eyelids.  Does have some itching.  Denies tearing, headache, floaters in vision. Has tried warm compresses without relief. ? ? ?Past Medical History:  ?Diagnosis Date  ? Depression   ? Paranoid schizophrenia (HCC)   ? ? ?Patient Active Problem List  ? Diagnosis Date Noted  ? Chest pain 09/22/2018  ? Elevated BP without diagnosis of hypertension 09/22/2018  ? Schizophrenia, unspecified (HCC) 10/12/2017  ? Schizophrenia (HCC) 10/10/2012  ? Paranoid schizophrenia (HCC)   ? Depression   ? Schizophrenia, paranoid type (HCC) 11/12/2011  ?  Class: Acute  ? ? ?No past surgical history on file. ? ? ? ? ?Home Medications   ? ?Prior to Admission medications   ?Medication Sig Start Date End Date Taking? Authorizing Provider  ?erythromycin ophthalmic ointment Place into the left eye at bedtime for 7 days. Place a 1/2 inch ribbon of ointment into the lower eyelid. 03/10/22 03/17/22 Yes Valentino NoseMartinez, Robinette Esters A, NP  ?acetaminophen (TYLENOL) 500 MG tablet Take 1 tablet (500 mg total) by mouth every 6 (six) hours as needed for mild pain. 10/14/17   Armandina StammerNwoko, Agnes I, NP  ?acetaminophen (TYLENOL) 500 MG tablet Take 2 tablets (1,000 mg total) by mouth every 6 (six) hours as needed. 12/18/18   Arby BarrettePfeiffer, Marcy, MD  ?amLODipine (NORVASC) 10 MG tablet Take 1 tablet (10 mg total) by mouth daily. 09/15/21   Gwyneth SproutPlunkett, Whitney, MD  ?FLUoxetine (PROZAC) 20 MG capsule Take 1 capsule (20 mg total) by mouth daily. For depression 10/15/17   Armandina StammerNwoko, Agnes I, NP  ?HYDROcodone-homatropine [facility](HYCODAN) 5-1.5 MG/5ML syrup  5-10 mL every 6 hours as needed for cough 12/18/18   Arby BarrettePfeiffer, Marcy, MD  ?ibuprofen (ADVIL,MOTRIN) 800 MG tablet Take 1 tablet (800 mg total) by mouth 3 (three) times daily. 12/18/18   Arby BarrettePfeiffer, Marcy, MD  ?ondansetron (ZOFRAN ODT) 4 MG disintegrating tablet Take 1 tablet (4 mg total) by mouth every 4 (four) hours as needed for nausea or vomiting. 12/18/18   Arby BarrettePfeiffer, Marcy, MD  ?promethazine (PHENERGAN) 25 MG tablet Take 1 tablet (25 mg total) by mouth every 6 (six) hours as needed for nausea or vomiting. 04/18/21   Tilden Fossaees, Elizabeth, MD  ?risperiDONE (RISPERDAL) 2 MG tablet Take 1 tablet (2 mg total) by mouth 2 (two) times daily. For mood control 10/14/17   Armandina StammerNwoko, Agnes I, NP  ?traZODone (DESYREL) 50 MG tablet Take 1 tablet (50 mg total) by mouth at bedtime as needed for sleep. 10/14/17   Sanjuana KavaNwoko, Agnes I, NP  ? ? ?Family History ?Family History  ?Problem Relation Age of Onset  ? Colon cancer Mother   ? Hyperlipidemia Father   ? Heart failure Brother   ? Diabetes Other   ? Stroke Other   ? ? ?Social History ?Social History  ? ?Tobacco Use  ? Smoking status: Former  ?  Packs/day: 0.25  ?  Types: Cigarettes  ? Smokeless tobacco: Current  ?  Types: Chew  ?Substance Use Topics  ? Alcohol use: Yes  ?  Alcohol/week: 1.0 standard drink  ?  Types: 1 Cans of beer per week  ?  Comment: one can of beer weekly  ? Drug use: No  ? ? ? ?Allergies   ?Spinach and Zofran [ondansetron] ? ? ?Review of Systems ?Review of Systems ?Per HPI ? ?Physical Exam ?Triage Vital Signs ?ED Triage Vitals  ?Enc Vitals Group  ?   BP 03/10/22 0923 (!) 164/93  ?   Pulse Rate 03/10/22 0922 81  ?   Resp 03/10/22 0922 17  ?   Temp 03/10/22 0922 97.8 ?F (36.6 ?C)  ?   Temp Source 03/10/22 0922 Oral  ?   SpO2 03/10/22 0922 99 %  ?   Weight --   ?   Height --   ?   Head Circumference --   ?   Peak Flow --   ?   Pain Score --   ?   Pain Loc --   ?   Pain Edu? --   ?   Excl. in GC? --   ? ?No data found. ? ?Updated Vital Signs ?BP (!) 156/93 (BP Location: Left Arm)  Comment: recheck requested by provider  Pulse 87   Temp 97.8 ?F (36.6 ?C) (Oral)   Resp 17   SpO2 99%  ? ?Visual Acuity ?Right Eye Distance:   ?Left Eye Distance:   ?Bilateral Distance:   ? ?Right Eye Near:   ?Left Eye Near:    ?Bilateral Near:    ? ?Physical Exam ?Constitutional:   ?   Appearance: Normal appearance.  ?Eyes:  ?   General: No scleral icterus.    ?   Right eye: No discharge.     ?   Left eye: No discharge.  ?   Extraocular Movements: Extraocular movements intact.  ?   Right eye: Normal extraocular motion and no nystagmus.  ?   Left eye: Normal extraocular motion and no nystagmus.  ?   Conjunctiva/sclera:  ?   Right eye: Right conjunctiva is not injected. No chemosis, exudate or hemorrhage. ?   Left eye: Left conjunctiva is injected. No exudate. ?   Pupils: Pupils are equal, round, and reactive to light.  ?   Comments: Firm, slightly mobile, flesh colored mass to upper left eyelid  ?Pulmonary:  ?   Effort: Pulmonary effort is normal. No respiratory distress.  ?Skin: ?   General: Skin is warm and dry.  ?   Capillary Refill: Capillary refill takes less than 2 seconds.  ?   Coloration: Skin is not jaundiced or pale.  ?   Findings: No erythema.  ?Neurological:  ?   Mental Status: He is alert and oriented to person, place, and time.  ?Psychiatric:     ?   Behavior: Behavior is cooperative.  ? ? ? ?UC Treatments / Results  ?Labs ?(all labs ordered are listed, but only abnormal results are displayed) ?Labs Reviewed - No data to display ? ?EKG ? ? ?Radiology ?No results found. ? ?Procedures ?Procedures (including critical care time) ? ?Medications Ordered in UC ?Medications - No data to display ? ?Initial Impression / Assessment and Plan / UC Course  ?I have reviewed the triage vital signs and the nursing notes. ? ?Pertinent labs & imaging results that were available during my care of the patient were reviewed by me and considered in my medical decision making (see chart for details). ? ?  ?Discussed  chalazion with patient-discussed that this is typically self-limiting, however since it  has been going on for swelling, encouraged follow-up with an eye doctor for further evaluation and management.  He can use erythromycin ophthalmic ointment to help with possible blepharitis.  Continue warm compresses 4 times daily to help with swelling.  The patient was given the opportunity to ask questions.   ? ? ?Regarding elevated blood pressure, he is not having any symptoms today.  BP recheck is slightly improved and he did ride his bike to clinic today.  Encouraged close follow up with PCP regarding elevated blood pressure.  With any new symptoms like chest pain, blurred vision, shortness of breath, go to ER.  All questions answered to their satisfaction.  The patient is in agreement to this plan.  ? ? ?Final Clinical Impressions(s) / UC Diagnoses  ? ?Final diagnoses:  ?Chalazion of left upper eyelid  ?Blepharitis of left upper eyelid, unspecified type  ?Elevated blood pressure reading without diagnosis of hypertension  ? ? ? ?Discharge Instructions   ? ?  ?- Please use the ointment in your eye at nighttime for 7 days to help with the redness and irritation ?-Please follow-up with Dr. Darcel Bayley to discuss management of the bump on your left eye ?-Please continue warm compresses 4 times daily until you see an eye specialist ? ? ? ? ?ED Prescriptions   ? ? Medication Sig Dispense Auth. Provider  ? erythromycin ophthalmic ointment Place into the left eye at bedtime for 7 days. Place a 1/2 inch ribbon of ointment into the lower eyelid. 3.5 g Valentino Nose, NP  ? ?  ? ?PDMP not reviewed this encounter. ?  ?Valentino Nose, NP ?03/10/22 3267 ? ?  ?Valentino Nose, NP ?03/10/22 0957 ? ?  ?Valentino Nose, NP ?03/10/22 0957 ? ?

## 2022-03-10 NOTE — ED Triage Notes (Signed)
C/o left eye pain and swelling x 2 weeks.  ?

## 2022-07-26 ENCOUNTER — Emergency Department (HOSPITAL_COMMUNITY): Payer: Medicare Other

## 2022-07-26 ENCOUNTER — Inpatient Hospital Stay (HOSPITAL_COMMUNITY)
Admission: EM | Admit: 2022-07-26 | Discharge: 2022-07-28 | DRG: 089 | Disposition: A | Discharge status: Home / Self Care | Payer: Medicare Other | Attending: General Surgery | Admitting: General Surgery

## 2022-07-26 ENCOUNTER — Other Ambulatory Visit: Payer: Self-pay

## 2022-07-26 ENCOUNTER — Encounter (HOSPITAL_COMMUNITY): Payer: Self-pay | Admitting: Emergency Medicine

## 2022-07-26 ENCOUNTER — Encounter: Payer: Self-pay | Admitting: *Deleted

## 2022-07-26 DIAGNOSIS — S0181XA Laceration without foreign body of other part of head, initial encounter: Secondary | ICD-10-CM

## 2022-07-26 DIAGNOSIS — S060X9A Concussion with loss of consciousness of unspecified duration, initial encounter: Secondary | ICD-10-CM | POA: Diagnosis present

## 2022-07-26 DIAGNOSIS — S01511A Laceration without foreign body of lip, initial encounter: Secondary | ICD-10-CM

## 2022-07-26 DIAGNOSIS — R569 Unspecified convulsions: Secondary | ICD-10-CM | POA: Diagnosis present

## 2022-07-26 DIAGNOSIS — R402412 Glasgow coma scale score 13-15, at arrival to emergency department: Secondary | ICD-10-CM | POA: Diagnosis present

## 2022-07-26 DIAGNOSIS — S01111A Laceration without foreign body of right eyelid and periocular area, initial encounter: Secondary | ICD-10-CM | POA: Diagnosis present

## 2022-07-26 DIAGNOSIS — F200 Paranoid schizophrenia: Secondary | ICD-10-CM | POA: Diagnosis present

## 2022-07-26 DIAGNOSIS — I10 Essential (primary) hypertension: Secondary | ICD-10-CM | POA: Diagnosis present

## 2022-07-26 DIAGNOSIS — Z20822 Contact with and (suspected) exposure to covid-19: Secondary | ICD-10-CM | POA: Diagnosis present

## 2022-07-26 DIAGNOSIS — Z5900 Homelessness unspecified: Secondary | ICD-10-CM | POA: Diagnosis not present

## 2022-07-26 DIAGNOSIS — T465X6A Underdosing of other antihypertensive drugs, initial encounter: Secondary | ICD-10-CM | POA: Diagnosis present

## 2022-07-26 HISTORY — DX: Schizophrenia, unspecified: F20.9

## 2022-07-26 LAB — COMPREHENSIVE METABOLIC PANEL
ALT: 22 U/L (ref 0–44)
AST: 33 U/L (ref 15–41)
Albumin: 4.2 g/dL (ref 3.5–5.0)
Alkaline Phosphatase: 41 U/L (ref 38–126)
Anion gap: 13 (ref 5–15)
BUN: 10 mg/dL (ref 6–20)
CO2: 20 mmol/L — ABNORMAL LOW (ref 22–32)
Calcium: 9.5 mg/dL (ref 8.9–10.3)
Chloride: 105 mmol/L (ref 98–111)
Creatinine, Ser: 1.16 mg/dL (ref 0.61–1.24)
GFR, Estimated: >60 mL/min (ref >60)
Glucose, Bld: 150 mg/dL — ABNORMAL HIGH (ref 70–99)
Potassium: 4 mmol/L (ref 3.5–5.1)
Sodium: 138 mmol/L (ref 135–145)
Total Bilirubin: 0.7 mg/dL (ref 0.3–1.2)
Total Protein: 7.3 g/dL (ref 6.5–8.1)

## 2022-07-26 LAB — HIV ANTIBODY (ROUTINE TESTING W REFLEX): HIV Screen 4th Generation wRfx: NONREACTIVE

## 2022-07-26 LAB — I-STAT CHEM 8, ED
BUN: 12 mg/dL (ref 6–20)
Calcium, Ion: 1.03 mmol/L — ABNORMAL LOW (ref 1.15–1.40)
Chloride: 103 mmol/L (ref 98–111)
Creatinine, Ser: 1.1 mg/dL (ref 0.61–1.24)
Glucose, Bld: 152 mg/dL — ABNORMAL HIGH (ref 70–99)
HCT: 44 % (ref 39.0–52.0)
Hemoglobin: 15 g/dL (ref 13.0–17.0)
Potassium: 4 mmol/L (ref 3.5–5.1)
Sodium: 137 mmol/L (ref 135–145)
TCO2: 23 mmol/L (ref 22–32)

## 2022-07-26 LAB — URINALYSIS, ROUTINE W REFLEX MICROSCOPIC
Bilirubin Urine: NEGATIVE
Glucose, UA: NEGATIVE mg/dL
Ketones, ur: NEGATIVE mg/dL
Leukocytes,Ua: NEGATIVE
Nitrite: NEGATIVE
Protein, ur: 100 mg/dL — AB
Specific Gravity, Urine: 1.015 (ref 1.005–1.030)
pH: 7 (ref 5.0–8.0)

## 2022-07-26 LAB — SAMPLE TO BLOOD BANK

## 2022-07-26 LAB — PROTIME-INR
INR: 1.1 (ref 0.8–1.2)
Prothrombin Time: 14 seconds (ref 11.4–15.2)

## 2022-07-26 LAB — CBC
HCT: 42.5 % (ref 39.0–52.0)
Hemoglobin: 14.1 g/dL (ref 13.0–17.0)
MCH: 30.9 pg (ref 26.0–34.0)
MCHC: 33.2 g/dL (ref 30.0–36.0)
MCV: 93 fL (ref 80.0–100.0)
Platelets: 295 10*3/uL (ref 150–400)
RBC: 4.57 MIL/uL (ref 4.22–5.81)
RDW: 12.7 % (ref 11.5–15.5)
WBC: 5.3 10*3/uL (ref 4.0–10.5)
nRBC: 0 % (ref 0.0–0.2)

## 2022-07-26 LAB — RESP PANEL BY RT-PCR (FLU A&B, COVID) ARPGX2
Influenza A by PCR: NEGATIVE
Influenza B by PCR: NEGATIVE
SARS Coronavirus 2 by RT PCR: NEGATIVE

## 2022-07-26 LAB — ETHANOL: Alcohol, Ethyl (B): <10 mg/dL (ref <10)

## 2022-07-26 LAB — LACTIC ACID, PLASMA: Lactic Acid, Venous: 3.3 mmol/L (ref 0.5–1.9)

## 2022-07-26 MED ORDER — MORPHINE SULFATE (PF) 4 MG/ML IV SOLN
4.0000 mg | INTRAVENOUS | Status: DC | PRN
  Administered 2022-07-26 – 2022-07-27 (×2): 4 mg via INTRAVENOUS
  Filled 2022-07-26 (×2): qty 1

## 2022-07-26 MED ORDER — ONDANSETRON HCL 4 MG/2ML IJ SOLN
INTRAMUSCULAR | Status: AC
  Administered 2022-07-26: 4 mg via INTRAVENOUS
  Filled 2022-07-26: qty 2

## 2022-07-26 MED ORDER — OXYCODONE HCL 5 MG PO TABS
5.0000 mg | ORAL_TABLET | ORAL | Status: DC | PRN
  Administered 2022-07-28: 5 mg via ORAL
  Filled 2022-07-26: qty 1

## 2022-07-26 MED ORDER — ONDANSETRON 4 MG PO TBDP: 4.0000 mg | ORAL_TABLET | Freq: Four times a day (QID) | ORAL | Status: DC | PRN

## 2022-07-26 MED ORDER — LEVETIRACETAM IN NACL 500 MG/100ML IV SOLN
500.0000 mg | Freq: Two times a day (BID) | INTRAVENOUS | Status: DC
  Administered 2022-07-26 – 2022-07-27 (×3): 500 mg via INTRAVENOUS
  Filled 2022-07-26 (×3): qty 100

## 2022-07-26 MED ORDER — CEPHALEXIN 500 MG PO CAPS
500.0000 mg | ORAL_CAPSULE | Freq: Four times a day (QID) | ORAL | Status: DC
  Administered 2022-07-26 – 2022-07-28 (×9): 500 mg via ORAL
  Filled 2022-07-26 (×7): qty 1
  Filled 2022-07-26: qty 2
  Filled 2022-07-26: qty 1

## 2022-07-26 MED ORDER — FENTANYL CITRATE (PF) 100 MCG/2ML IJ SOLN
INTRAMUSCULAR | Status: AC
  Administered 2022-07-26: 100 ug
  Filled 2022-07-26: qty 2

## 2022-07-26 MED ORDER — POTASSIUM CHLORIDE IN NACL 20-0.9 MEQ/L-% IV SOLN
INTRAVENOUS | Status: DC
  Filled 2022-07-26 (×2): qty 1000

## 2022-07-26 MED ORDER — ONDANSETRON HCL 4 MG/2ML IJ SOLN
4.0000 mg | Freq: Four times a day (QID) | INTRAMUSCULAR | Status: DC | PRN
  Administered 2022-07-26: 4 mg via INTRAVENOUS
  Filled 2022-07-26: qty 2

## 2022-07-26 MED ORDER — OXYCODONE HCL 5 MG PO TABS
10.0000 mg | ORAL_TABLET | ORAL | Status: DC | PRN
  Administered 2022-07-26 – 2022-07-27 (×4): 10 mg via ORAL
  Filled 2022-07-26 (×5): qty 2

## 2022-07-26 MED ORDER — METHOCARBAMOL 1000 MG/10ML IJ SOLN
1000.0000 mg | Freq: Three times a day (TID) | INTRAVENOUS | Status: DC
  Administered 2022-07-26 – 2022-07-27 (×3): 1000 mg via INTRAVENOUS
  Filled 2022-07-26 (×2): qty 10
  Filled 2022-07-26: qty 1000
  Filled 2022-07-26 (×5): qty 10

## 2022-07-26 MED ORDER — ENOXAPARIN SODIUM 30 MG/0.3ML IJ SOSY
30.0000 mg | PREFILLED_SYRINGE | Freq: Two times a day (BID) | INTRAMUSCULAR | Status: DC
  Administered 2022-07-27 – 2022-07-28 (×3): 30 mg via SUBCUTANEOUS
  Filled 2022-07-26 (×3): qty 0.3

## 2022-07-26 MED ORDER — LIDOCAINE-EPINEPHRINE (PF) 2 %-1:200000 IJ SOLN
INTRAMUSCULAR | Status: AC
  Administered 2022-07-26: 20 mL
  Filled 2022-07-26: qty 20

## 2022-07-26 MED ORDER — ONDANSETRON HCL 4 MG/2ML IJ SOLN: 4.0000 mg | Freq: Once | INTRAMUSCULAR | Status: AC

## 2022-07-26 MED ORDER — ACETAMINOPHEN 325 MG PO TABS
650.0000 mg | ORAL_TABLET | ORAL | Status: DC | PRN
  Administered 2022-07-27: 650 mg via ORAL
  Filled 2022-07-26: qty 2
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
## 2022-07-27 ENCOUNTER — Inpatient Hospital Stay (HOSPITAL_COMMUNITY): Payer: Medicare Other

## 2022-07-27 ENCOUNTER — Other Ambulatory Visit (HOSPITAL_COMMUNITY): Payer: Self-pay

## 2022-07-27 LAB — BASIC METABOLIC PANEL
Anion gap: 10 (ref 5–15)
BUN: 9 mg/dL (ref 6–20)
CO2: 21 mmol/L — ABNORMAL LOW (ref 22–32)
Calcium: 9.1 mg/dL (ref 8.9–10.3)
Chloride: 106 mmol/L (ref 98–111)
Creatinine, Ser: 0.99 mg/dL (ref 0.61–1.24)
GFR, Estimated: >60 mL/min (ref >60)
Glucose, Bld: 108 mg/dL — ABNORMAL HIGH (ref 70–99)
Potassium: 4.1 mmol/L (ref 3.5–5.1)
Sodium: 137 mmol/L (ref 135–145)

## 2022-07-27 LAB — CBC
HCT: 38.4 % — ABNORMAL LOW (ref 39.0–52.0)
Hemoglobin: 12.8 g/dL — ABNORMAL LOW (ref 13.0–17.0)
MCH: 30.8 pg (ref 26.0–34.0)
MCHC: 33.3 g/dL (ref 30.0–36.0)
MCV: 92.5 fL (ref 80.0–100.0)
Platelets: 262 10*3/uL (ref 150–400)
RBC: 4.15 MIL/uL — ABNORMAL LOW (ref 4.22–5.81)
RDW: 12.9 % (ref 11.5–15.5)
WBC: 8.9 10*3/uL (ref 4.0–10.5)
nRBC: 0 % (ref 0.0–0.2)

## 2022-07-27 MED ORDER — BACITRACIN ZINC 500 UNIT/GM EX OINT
TOPICAL_OINTMENT | Freq: Two times a day (BID) | CUTANEOUS | Status: DC
  Administered 2022-07-27: 1 via TOPICAL
  Filled 2022-07-27: qty 28.4

## 2022-07-27 MED ORDER — LEVETIRACETAM 500 MG PO TABS
500.0000 mg | ORAL_TABLET | Freq: Two times a day (BID) | ORAL | 0 refills | Status: DC
  Filled 2022-07-27: qty 11, 6d supply, fill #0

## 2022-07-27 MED ORDER — OXYCODONE HCL 5 MG PO TABS
5.0000 mg | ORAL_TABLET | Freq: Four times a day (QID) | ORAL | 0 refills | Status: DC | PRN
  Filled 2022-07-27: qty 15, 4d supply, fill #0

## 2022-07-27 MED ORDER — METHOCARBAMOL 500 MG PO TABS
1000.0000 mg | ORAL_TABLET | Freq: Three times a day (TID) | ORAL | Status: DC
  Administered 2022-07-27 – 2022-07-28 (×2): 1000 mg via ORAL
  Filled 2022-07-27 (×2): qty 2

## 2022-07-27 MED ORDER — METHOCARBAMOL 500 MG PO TABS
500.0000 mg | ORAL_TABLET | Freq: Four times a day (QID) | ORAL | 0 refills | Status: DC | PRN
  Filled 2022-07-27: qty 30, 8d supply, fill #0

## 2022-07-27 MED ORDER — CEPHALEXIN 500 MG PO CAPS
500.0000 mg | ORAL_CAPSULE | Freq: Four times a day (QID) | ORAL | 0 refills | Status: AC
  Filled 2022-07-27: qty 12, 3d supply, fill #0

## 2022-07-27 MED ORDER — LEVETIRACETAM 500 MG PO TABS
500.0000 mg | ORAL_TABLET | Freq: Two times a day (BID) | ORAL | Status: DC
  Administered 2022-07-27 – 2022-07-28 (×2): 500 mg via ORAL
  Filled 2022-07-27 (×2): qty 1

## 2022-07-27 MED ORDER — BACITRACIN ZINC 500 UNIT/GM EX OINT: TOPICAL_OINTMENT | Freq: Two times a day (BID) | CUTANEOUS | 0 refills | Status: DC
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
## 2022-07-28 ENCOUNTER — Encounter (HOSPITAL_COMMUNITY): Payer: Self-pay
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
## 2022-07-28 NOTE — TOC Transition Note (Signed)
Transition of Care [facility]) - CM/SW Discharge Note   Patient Details  Name: Damon [name] MRN: [id number] Date of Birth: 04/02/1966  [facility] CM/SW Contact:  Glennon Mac, RN Phone Number: 07/28/2022, 12:26 PM   Clinical Narrative:    Patient medically stable for discharge home today with fiance/roommate to provide assistance.  RW and 3 in 1 have been delivered to room; patient declined tub bench.  Home health recommended, but patient declines; he is agreeable to OP follow up.  Referral sent to [facility] for OP PT/OT and ST.  Patient states he has transportation home.     Final next level of care: OP Rehab Barriers to Discharge: Barriers Resolved   Patient Goals and CMS Choice Patient states their goals for this hospitalization and ongoing recovery are:: to go home                           Discharge Plan and Services   Discharge Planning Services: CM Consult            DME Arranged: Walker rolling, Bedside commode, Tub bench DME Agency: [employer] Date DME Agency Contacted: 07/27/22 Time DME Agency Contacted: 1600 Representative spoke with at DME Agency: [place]            Social Determinants of Health (SDOH) Interventions     Readmission Risk Interventions     No data to display         Quintella Baton, RN, BSN  Trauma/Neuro ICU Case Manager [phone]
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
## 2022-07-29 ENCOUNTER — Other Ambulatory Visit (HOSPITAL_COMMUNITY): Payer: Self-pay
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
## 2022-08-12 ENCOUNTER — Ambulatory Visit: Payer: Medicare Other | Attending: Physician Assistant | Admitting: Occupational Therapy

## 2022-08-12 ENCOUNTER — Ambulatory Visit: Payer: Medicare Other | Admitting: Speech Pathology

## 2022-08-12 ENCOUNTER — Ambulatory Visit: Payer: Medicare Other | Admitting: Physical Therapy

## 2022-09-01 LAB — GLUCOSE, POCT (MANUAL RESULT ENTRY): POC Glucose: 165 mg/dl — AB (ref 70–99)

## 2022-09-03 ENCOUNTER — Encounter (HOSPITAL_COMMUNITY): Payer: Self-pay | Admitting: Emergency Medicine

## 2022-09-03 ENCOUNTER — Emergency Department (HOSPITAL_COMMUNITY): Payer: Medicare Other

## 2022-09-03 ENCOUNTER — Other Ambulatory Visit: Payer: Self-pay

## 2022-09-03 ENCOUNTER — Emergency Department (HOSPITAL_COMMUNITY)
Admission: EM | Admit: 2022-09-03 | Discharge: 2022-09-03 | Discharge status: Against Medical Advice | Payer: Medicare Other | Attending: Emergency Medicine | Admitting: Emergency Medicine

## 2022-09-03 ENCOUNTER — Ambulatory Visit (HOSPITAL_COMMUNITY)
Admission: EM | Admit: 2022-09-03 | Discharge: 2022-09-03 | Disposition: A | Discharge status: Home / Self Care | Payer: Medicare Other | Attending: Physician Assistant | Admitting: Physician Assistant

## 2022-09-03 DIAGNOSIS — R55 Syncope and collapse: Secondary | ICD-10-CM

## 2022-09-03 DIAGNOSIS — R350 Frequency of micturition: Secondary | ICD-10-CM | POA: Diagnosis not present

## 2022-09-03 DIAGNOSIS — Z5321 Procedure and treatment not carried out due to patient leaving prior to being seen by health care provider: Secondary | ICD-10-CM | POA: Diagnosis not present

## 2022-09-03 DIAGNOSIS — I10 Essential (primary) hypertension: Secondary | ICD-10-CM | POA: Diagnosis not present

## 2022-09-03 DIAGNOSIS — R5383 Other fatigue: Secondary | ICD-10-CM | POA: Insufficient documentation

## 2022-09-03 DIAGNOSIS — R519 Headache, unspecified: Secondary | ICD-10-CM | POA: Diagnosis present

## 2022-09-03 HISTORY — DX: Essential (primary) hypertension: I10

## 2022-09-03 LAB — BASIC METABOLIC PANEL
Anion gap: 10 (ref 5–15)
BUN: 15 mg/dL (ref 6–20)
CO2: 22 mmol/L (ref 22–32)
Calcium: 9.1 mg/dL (ref 8.9–10.3)
Chloride: 106 mmol/L (ref 98–111)
Creatinine, Ser: 1.08 mg/dL (ref 0.61–1.24)
GFR, Estimated: >60 mL/min (ref >60)
Glucose, Bld: 103 mg/dL — ABNORMAL HIGH (ref 70–99)
Potassium: 4 mmol/L (ref 3.5–5.1)
Sodium: 138 mmol/L (ref 135–145)

## 2022-09-03 LAB — CBC WITH DIFFERENTIAL/PLATELET
Abs Immature Granulocytes: 0.01 10*3/uL (ref 0.00–0.07)
Basophils Absolute: 0 10*3/uL (ref 0.0–0.1)
Basophils Relative: 0 %
Eosinophils Absolute: 0 10*3/uL (ref 0.0–0.5)
Eosinophils Relative: 1 %
HCT: 40.7 % (ref 39.0–52.0)
Hemoglobin: 14 g/dL (ref 13.0–17.0)
Immature Granulocytes: 0 %
Lymphocytes Relative: 33 %
Lymphs Abs: 1.6 10*3/uL (ref 0.7–4.0)
MCH: 31.2 pg (ref 26.0–34.0)
MCHC: 34.4 g/dL (ref 30.0–36.0)
MCV: 90.6 fL (ref 80.0–100.0)
Monocytes Absolute: 0.5 10*3/uL (ref 0.1–1.0)
Monocytes Relative: 9 %
Neutro Abs: 2.8 10*3/uL (ref 1.7–7.7)
Neutrophils Relative %: 57 %
Platelets: 294 10*3/uL (ref 150–400)
RBC: 4.49 MIL/uL (ref 4.22–5.81)
RDW: 13 % (ref 11.5–15.5)
WBC: 5 10*3/uL (ref 4.0–10.5)
nRBC: 0 % (ref 0.0–0.2)

## 2022-09-03 LAB — TROPONIN I (HIGH SENSITIVITY)
Troponin I (High Sensitivity): 8 ng/L (ref <18)
Troponin I (High Sensitivity): 7 ng/L (ref <18)

## 2022-09-03 MED ORDER — BENAZEPRIL HCL 10 MG PO TABS: 10.0000 mg | ORAL_TABLET | Freq: Every day | ORAL | 2 refills | Status: DC

## 2022-09-03 NOTE — ED Notes (Signed)
This NT got pt's vitals at 1220 and pt asked this NT if they can leave. This NT notified pt that it is not up to this NT, but up to the doctor if you can leave. Pt stopped this NT @ 6203 and notified this NT that pt is going to leave. Pt seen leaving ED.

## 2022-09-03 NOTE — ED Triage Notes (Signed)
Pt reports that his BP been all over the place and work stated that he had to be seen and get taken care of before can return to work. Reports PCP cant get him in for a good long while. Reports hasnt had medications for HTN before.  Works a Forensic scientist in a [employer]

## 2022-09-03 NOTE — ED Triage Notes (Signed)
Patient states his nurse at his job checked his BP early this morning due to feeling dizzy and fatigued. Patient states similar episode happened about 6 months ago and has seen his PCP but has been told he has borderline hypertension.

## 2022-09-03 NOTE — ED Provider Notes (Signed)
[facility]    CSN: [id number] Arrival date & time: 09/03/22  1256      History   Chief Complaint Chief Complaint  Patient presents with   Hypertension    HPI Damon [name] is a 56 y.o. male.   56 year old male presents with syncope and hypertension.  Patient indicates that he was at work this morning he works third shift from 11-7.  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  Patient indicates that he was only out for several seconds when he came back to.  He indicates that he was taken into the supervisors office to where she advised that he needed to see his primary care physician to be evaluated for his passing out episode.  Patient indicates that he went to the emergency room early this morning he had a head CT scan, EKG, chest x-ray, and blood work performed.  (These tests and labs were within normal range) He indicates that he left the ER and decided he wanted to be seen here at the urgent care.  Patient indicates that he is currently doing well today that he still has a dull headache but he feels that he is feeling better.  He denies having any dizziness, vision changes, chest pain, shortness of breath, nausea or vomiting.  Patient indicates he does have a history of having hypertension but he has not taken his medicine since November 2022.  He indicates that he stopped the Norvasc because it made him feel dizzy, fatigued, and nauseous.  He desires to restart blood pressure medicine to get control of his hypertension and be able to return to work.   Hypertension    Past Medical History:  Diagnosis Date   Depression    Hypertension    Paranoid schizophrenia ([place])    Schizophrenia [facility])     Patient Active Problem List   Diagnosis Date Noted   Concussion with < 1 hr loss of consciousness 07/26/2022   Chest pain 09/22/2018   Elevated BP without diagnosis of  hypertension 09/22/2018   Schizophrenia, unspecified ([place]) 10/12/2017   Schizophrenia ([place]) 10/10/2012   Paranoid schizophrenia ([place])    Depression    Schizophrenia, paranoid type ([place]) 11/12/2011    Class: Acute    History reviewed. No pertinent surgical history.     Home Medications    Prior to Admission medications   Medication Sig Start Date End Date Taking? Authorizing Provider  benazepril (LOTENSIN) 10 MG tablet Take 1 tablet (10 mg total) by mouth daily. For blood pressure. 09/03/22  Yes Nyoka Lint, PA-C  acetaminophen (TYLENOL) 325 MG tablet Take 650 mg by mouth every 6 (six) hours as needed for mild pain or headache.    [provider]  acetaminophen (TYLENOL) 500 MG tablet Take 1 tablet (500 mg total) by mouth every 6 (six) hours as needed for mild pain. 10/14/17   Lindell Spar I, NP  acetaminophen (TYLENOL) 500 MG tablet Take 2 tablets (1,000 mg total) by mouth every 6 (six) hours as needed. 12/18/18   Charlesetta Shanks, MD  amLODipine (NORVASC) 10 MG tablet Take 1 tablet (10 mg total) by mouth daily. 09/15/21   Blanchie Dessert, MD  bacitracin ointment Apply topically 2 (two) times daily. 07/27/22   Meuth, Brooke A, PA-C  FLUoxetine (PROZAC) 20 MG capsule Take 1 capsule (20 mg total) by mouth daily. For depression 10/15/17   Lindell Spar  I, NP  HYDROcodone-homatropine (HYCODAN) 5-1.5 MG/5ML syrup 5-10 mL every 6 hours as needed for cough 12/18/18   Charlesetta Shanks, MD  ibuprofen (ADVIL) 200 MG tablet Take 200 mg by mouth every 6 (six) hours as needed for headache or mild pain.    [provider]  ibuprofen (ADVIL,MOTRIN) 800 MG tablet Take 1 tablet (800 mg total) by mouth 3 (three) times daily. 12/18/18   Charlesetta Shanks, MD  levETIRAcetam (KEPPRA) 500 MG tablet Take 1 tablet (500 mg total) by mouth 2 (two) times daily. 07/27/22   Meuth, Blaine Hamper, PA-C  methocarbamol (ROBAXIN) 500 MG tablet Take 1 tablet (500 mg total) by mouth every 6 (six) hours as needed for  muscle spasms. 07/27/22   Meuth, Brooke A, PA-C  ondansetron (ZOFRAN ODT) 4 MG disintegrating tablet Take 1 tablet (4 mg total) by mouth every 4 (four) hours as needed for nausea or vomiting. 12/18/18   Charlesetta Shanks, MD  oxyCODONE (OXY IR/ROXICODONE) 5 MG immediate release tablet Take 1 tablet (5 mg total) by mouth every 6 (six) hours as needed for moderate pain or severe pain. 07/27/22   Meuth, Blaine Hamper, PA-C  promethazine (PHENERGAN) 25 MG tablet Take 1 tablet (25 mg total) by mouth every 6 (six) hours as needed for nausea or vomiting. 04/18/21   Quintella Reichert, MD  risperiDONE (RISPERDAL) 2 MG tablet Take 1 tablet (2 mg total) by mouth 2 (two) times daily. For mood control 10/14/17   Lindell Spar I, NP  traZODone (DESYREL) 50 MG tablet Take 1 tablet (50 mg total) by mouth at bedtime as needed for sleep. 10/14/17   Encarnacion Slates, NP    Family History Family History  Problem Relation Age of Onset   Colon cancer Mother    Hyperlipidemia Father    Heart failure Brother    Diabetes Other    Stroke Other     Social History Social History   Tobacco Use   Smoking status: Never   Smokeless tobacco: Never  Substance Use Topics   Alcohol use: Yes    Alcohol/week: 1.0 standard drink of alcohol    Types: 1 Cans of beer per week    Comment: one can of beer weekly   Drug use: No     Allergies   Spinach, Spinach, and Zofran [ondansetron]   Review of Systems Review of Systems  Neurological:  Positive for dizziness and syncope.     Physical Exam Triage Vital Signs ED Triage Vitals  Enc Vitals Group     BP 09/03/22 1400 (!) 165/98     Pulse Rate 09/03/22 1400 76     Resp 09/03/22 1400 14     Temp 09/03/22 1400 98.3 F (36.8 C)     Temp Source 09/03/22 1400 Oral     SpO2 09/03/22 1400 97 %     Weight --      Height --      Head Circumference --      Peak Flow --      Pain Score 09/03/22 1358 0     Pain Loc --      Pain Edu? --      Excl. in [place]? --    No data  found.  Updated Vital Signs BP (!) 165/98 (BP Location: Left Arm)   Pulse 76   Temp 98.3 F (36.8 C) (Oral)   Resp 14   SpO2 97%   Visual Acuity Right Eye Distance:   Left Eye Distance:  Bilateral Distance:    Right Eye Near:   Left Eye Near:    Bilateral Near:     Physical Exam Constitutional:      Appearance: Normal appearance.  HENT:     Right Ear: Tympanic membrane and ear canal normal.     Left Ear: Tympanic membrane and ear canal normal.     Mouth/Throat:     Mouth: Mucous membranes are moist.     Pharynx: Oropharynx is clear.  Cardiovascular:     Rate and Rhythm: Normal rate and regular rhythm.     Heart sounds: Normal heart sounds.  Pulmonary:     Effort: Pulmonary effort is normal.     Breath sounds: Normal breath sounds and air entry. No wheezing, rhonchi or rales.  Lymphadenopathy:     Cervical: No cervical adenopathy.  Neurological:     General: No focal deficit present.     Mental Status: He is alert.     Cranial Nerves: Cranial nerves 2-12 are intact.     Motor: Motor function is intact.     Coordination: Coordination is intact. Romberg sign negative.      UC Treatments / Results  Labs (all labs ordered are listed, but only abnormal results are displayed) Labs Reviewed - No data to display  EKG   Radiology CT Head Wo Contrast  Result Date: 09/03/2022 CLINICAL DATA:  Headache, new or worsening. EXAM: CT HEAD WITHOUT CONTRAST TECHNIQUE: Contiguous axial images were obtained from the base of the skull through the vertex without intravenous contrast. RADIATION DOSE REDUCTION: This exam was performed according to the departmental dose-optimization program which includes automated exposure control, adjustment of the mA and/or kV according to patient size and/or use of iterative reconstruction technique. COMPARISON:  CT head 10/11/2017 and 07/26/2022. FINDINGS: Brain: There is no evidence of acute intracranial hemorrhage, mass lesion, brain edema or  extra-axial fluid collection. The ventricles and subarachnoid spaces are appropriately sized for age. There is no CT evidence of acute cortical infarction. Vascular: Mild atherosclerosis at the skull base. No hyperdense vessel identified. Skull: Negative for fracture or focal lesion. Sinuses/Orbits: The visualized paranasal sinuses and mastoid air cells are clear. No orbital abnormalities are seen. Other: None. IMPRESSION: No acute intracranial findings or explanation for the patient's symptoms. Electronically Signed   By: Richardean Sale M.D.   On: 09/03/2022 08:14   DG Chest 2 View  Result Date: 09/03/2022 CLINICAL DATA:  Sudden onset of hypertension.  Chest pain EXAM: CHEST - 2 VIEW COMPARISON:  07/26/2022 FINDINGS: Normal heart size and mediastinal contours. No acute infiltrate or edema. No effusion or pneumothorax. No acute osseous findings. Artifact from EKG pads. IMPRESSION: Negative chest. Electronically Signed   By: Jorje Guild M.D.   On: 09/03/2022 07:45    Procedures Procedures (including critical care time)  Medications Ordered in UC Medications - No data to display  Initial Impression / Assessment and Plan / UC Course  I have reviewed the triage vital signs and the nursing notes.  Pertinent labs & imaging results that were available during my care of the patient were reviewed by me and considered in my medical decision making (see chart for details).    Plan: 1.  The syncope will be treated with the following: A.  Studies of head CT scan, chest x-ray, EKG results have been reviewed due to the syncopal episode. B.  The lab results of CBC, and CMP have been reviewed. 2.  The essential hypertension will be treated with  the following: A.  Patient is advised start Lotensin 10 mg 1 tablet daily to gain control of the high blood pressure. B.  Patient advised to stop all salt, reduce caffeine intake, and watch nutritional status to decrease the high blood pressure. 3.  Patient  advised to follow-up with his PCP in the next 6 weeks to have his blood pressure reevaluated and to determine if the blood pressure medicine needs to be increased. 4.  Patient advised to follow-up with PCP or return to urgent care if symptoms fail to improve. Final Clinical Impressions(s) / UC Diagnoses   Final diagnoses:  Syncope, unspecified syncope type  Essential hypertension     Discharge Instructions      Advised to start on the Lotensin 10 mg, 1 tablet daily usually taking in the morning. Advised to decrease caffeine intake and salt intake. Advised to follow-up with PCP within the next 4 to 6 weeks to reevaluate blood pressure to determine if medication needs to be adjusted and increased. Return to urgent care as needed.    ED Prescriptions     Medication Sig Dispense Auth. Provider   benazepril (LOTENSIN) 10 MG tablet Take 1 tablet (10 mg total) by mouth daily. For blood pressure. 30 tablet Nyoka Lint, PA-C      PDMP not reviewed this encounter.   Nyoka Lint, PA-C 09/03/22 1430

## 2022-09-03 NOTE — Discharge Instructions (Addendum)
Advised to start on the Lotensin 10 mg, 1 tablet daily usually taking in the morning. Advised to decrease caffeine intake and salt intake. Advised to follow-up with PCP within the next 4 to 6 weeks to reevaluate blood pressure to determine if medication needs to be adjusted and increased. Return to urgent care as needed.

## 2022-09-03 NOTE — ED Provider Triage Note (Signed)
Emergency Medicine Provider Triage Evaluation Note  Damon [name] , a 56 y.o. male  was evaluated in triage.  Pt complains of high blood pressure.  Was at work, blood pressure checked and sent to ED for evaluation.  States has been feeling dizzy for the last 4 days having severe headaches.  The room is spinning, denies any lateralized weakness or numbness, vision changes, shortness of breath.  Is also endorses increased urinary frequency without dysuria or hematuria.  Some intermittent chest pains as well.  Not short of breath, no fevers.  History of elevated blood pressure readings but not on blood pressure medicine.  Does not take amlodipine.  Denies any history of diabetes..  Review of Systems  Per HPI  Physical Exam  BP (!) 160/111 (BP Location: Right Arm)   Pulse 97   Temp 98.1 F (36.7 C) (Oral)   Resp 18   Ht 5\' 11"  (1.803 m)   Wt 87.5 kg   SpO2 100%   BMI 26.92 kg/m  Gen:   Awake, no distress   Resp:  Normal effort  MSK:   Moves extremities without difficulty  Other:  Regular rhythm, benign neuro exam.  Medical Decision Making  Medically screening exam initiated at 7:20 AM.  Appropriate orders placed.  Damon [name] was informed that the remainder of the evaluation will be completed by another provider, this initial triage assessment does not replace that evaluation, and the importance of remaining in the ED until their evaluation is complete.  Hypertensive emergency work-up.  We will also check troponin and chest x-ray given intermittent chest pain   Sherrill Raring, [place] 09/03/22 6578

## 2022-09-26 IMAGING — CR DG THORACIC SPINE 3V
1 series · 1 of 1 positions shown · non-contrast
Comparison: CT chest abdomen pelvis 09/22/2018

CLINICAL DATA: Chronic thoracic and lumbar pain

EXAM:
THORACIC SPINE - 3 VIEWS

[t t-spine a.p.]
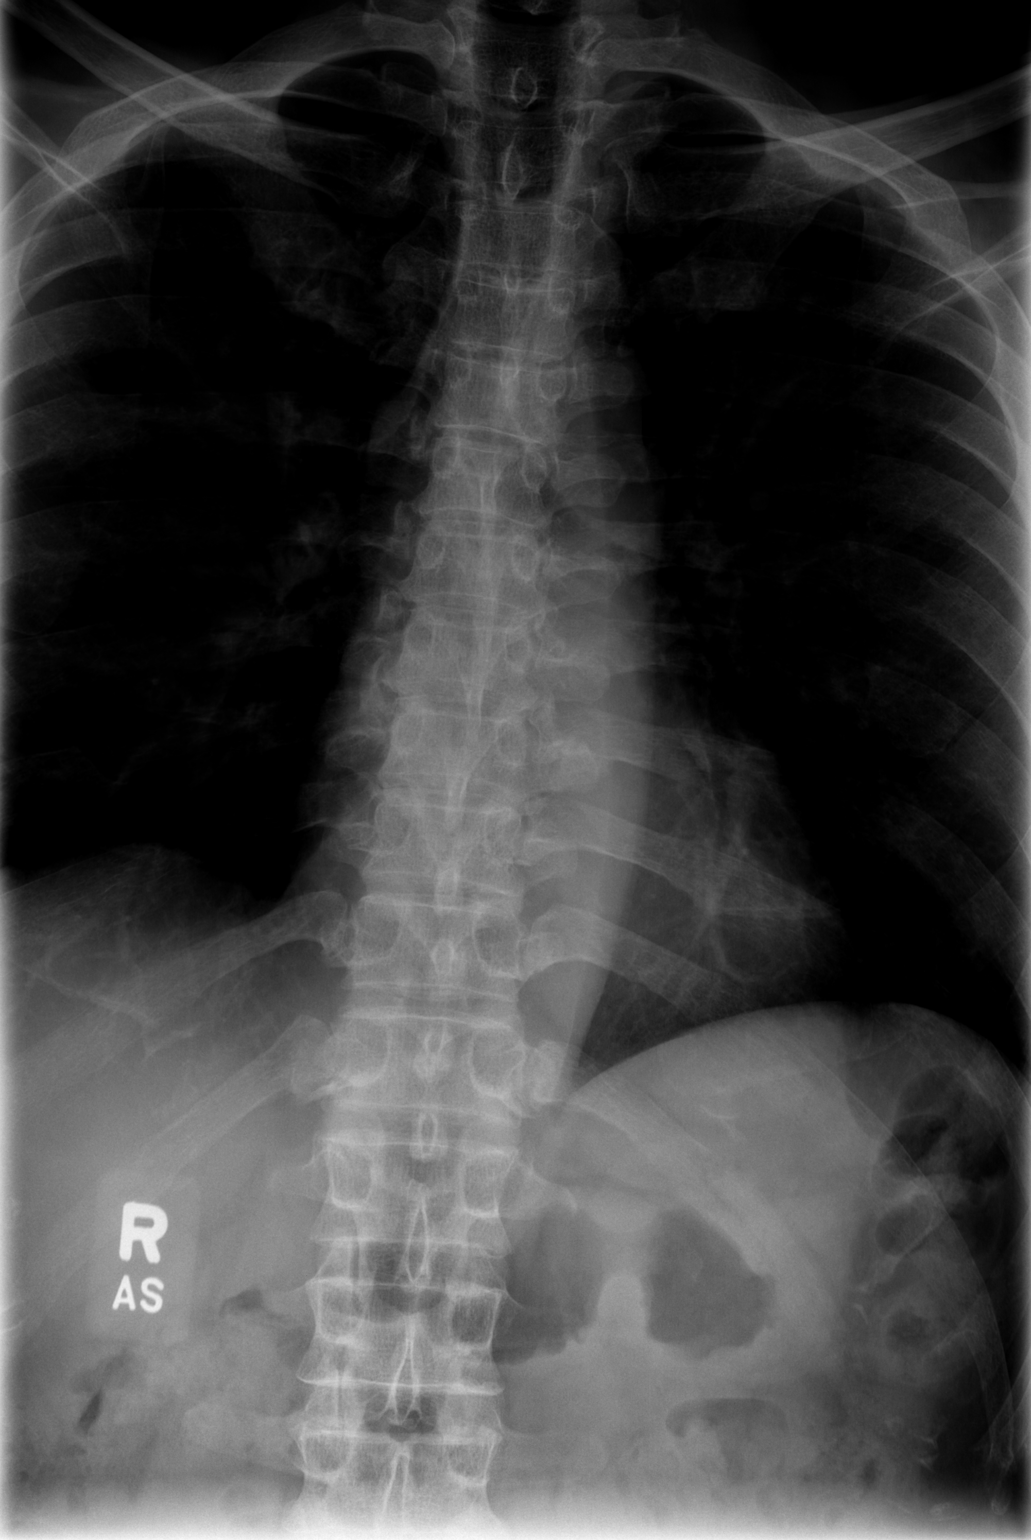

[1 of 1 positions shown; findings below may reference images not displayed]

FINDINGS: Twelve pairs of ribs.

Osseous mineralization normal for technique.

Vertebral body and disc space heights maintained.

No fracture, subluxation, or bone destruction.
IMPRESSION: No acute abnormalities.

## 2022-10-10 ENCOUNTER — Encounter (HOSPITAL_COMMUNITY): Payer: Self-pay

## 2022-10-10 ENCOUNTER — Emergency Department (HOSPITAL_COMMUNITY)
Admission: EM | Admit: 2022-10-10 | Discharge: 2022-10-11 | Disposition: A | Discharge status: Home / Self Care | Payer: Medicare Other | Attending: Emergency Medicine | Admitting: Emergency Medicine

## 2022-10-10 ENCOUNTER — Emergency Department (HOSPITAL_COMMUNITY): Payer: Medicare Other

## 2022-10-10 ENCOUNTER — Other Ambulatory Visit: Payer: Self-pay

## 2022-10-10 DIAGNOSIS — Z1152 Encounter for screening for COVID-19: Secondary | ICD-10-CM | POA: Insufficient documentation

## 2022-10-10 DIAGNOSIS — R440 Auditory hallucinations: Secondary | ICD-10-CM | POA: Insufficient documentation

## 2022-10-10 DIAGNOSIS — F209 Schizophrenia, unspecified: Secondary | ICD-10-CM | POA: Insufficient documentation

## 2022-10-10 DIAGNOSIS — S0990XA Unspecified injury of head, initial encounter: Secondary | ICD-10-CM | POA: Insufficient documentation

## 2022-10-10 DIAGNOSIS — R45851 Suicidal ideations: Secondary | ICD-10-CM | POA: Insufficient documentation

## 2022-10-10 DIAGNOSIS — Z79899 Other long term (current) drug therapy: Secondary | ICD-10-CM | POA: Insufficient documentation

## 2022-10-10 DIAGNOSIS — I10 Essential (primary) hypertension: Secondary | ICD-10-CM | POA: Insufficient documentation

## 2022-10-10 LAB — ACETAMINOPHEN LEVEL: Acetaminophen (Tylenol), Serum: <10 ug/mL — ABNORMAL LOW (ref 10–30)

## 2022-10-10 LAB — COMPREHENSIVE METABOLIC PANEL
ALT: 17 U/L (ref 0–44)
AST: 24 U/L (ref 15–41)
Albumin: 3.8 g/dL (ref 3.5–5.0)
Alkaline Phosphatase: 37 U/L — ABNORMAL LOW (ref 38–126)
Anion gap: 12 (ref 5–15)
BUN: 10 mg/dL (ref 6–20)
CO2: 21 mmol/L — ABNORMAL LOW (ref 22–32)
Calcium: 8.9 mg/dL (ref 8.9–10.3)
Chloride: 103 mmol/L (ref 98–111)
Creatinine, Ser: 1.02 mg/dL (ref 0.61–1.24)
GFR, Estimated: >60 mL/min (ref >60)
Glucose, Bld: 137 mg/dL — ABNORMAL HIGH (ref 70–99)
Potassium: 3.9 mmol/L (ref 3.5–5.1)
Sodium: 136 mmol/L (ref 135–145)
Total Bilirubin: 0.7 mg/dL (ref 0.3–1.2)
Total Protein: 6.6 g/dL (ref 6.5–8.1)

## 2022-10-10 LAB — RESP PANEL BY RT-PCR (FLU A&B, COVID) ARPGX2
Influenza A by PCR: NEGATIVE
Influenza B by PCR: NEGATIVE
SARS Coronavirus 2 by RT PCR: NEGATIVE

## 2022-10-10 LAB — SALICYLATE LEVEL: Salicylate Lvl: <7 mg/dL — ABNORMAL LOW (ref 7.0–30.0)

## 2022-10-10 LAB — CBC
HCT: 40.4 % (ref 39.0–52.0)
Hemoglobin: 13.2 g/dL (ref 13.0–17.0)
MCH: 30.6 pg (ref 26.0–34.0)
MCHC: 32.7 g/dL (ref 30.0–36.0)
MCV: 93.5 fL (ref 80.0–100.0)
Platelets: 301 10*3/uL (ref 150–400)
RBC: 4.32 MIL/uL (ref 4.22–5.81)
RDW: 12.7 % (ref 11.5–15.5)
WBC: 4.5 10*3/uL (ref 4.0–10.5)
nRBC: 0 % (ref 0.0–0.2)

## 2022-10-10 LAB — RAPID URINE DRUG SCREEN, HOSP PERFORMED
Amphetamines: NOT DETECTED
Barbiturates: NOT DETECTED
Benzodiazepines: NOT DETECTED
Cocaine: NOT DETECTED
Opiates: NOT DETECTED
Tetrahydrocannabinol: NOT DETECTED

## 2022-10-10 LAB — ETHANOL: Alcohol, Ethyl (B): <10 mg/dL (ref <10)

## 2022-10-10 MED ORDER — ACETAMINOPHEN 325 MG PO TABS: 650.0000 mg | ORAL_TABLET | Freq: Four times a day (QID) | ORAL | Status: DC | PRN

## 2022-10-10 MED ORDER — NICOTINE 21 MG/24HR TD PT24
21.0000 mg | MEDICATED_PATCH | Freq: Every day | TRANSDERMAL | Status: DC
  Administered 2022-10-10 – 2022-10-11 (×2): 21 mg via TRANSDERMAL
  Filled 2022-10-10 (×2): qty 1
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
## 2022-10-10 NOTE — ED Notes (Signed)
Pt changed into purple scrubs. Calm, cooperative, appropriate conversation, movement, speech pattern. Pt provided food and snacks at this time as well.
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
## 2022-10-10 NOTE — ED Notes (Signed)
Sitter at bedside.
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
## 2022-10-10 NOTE — ED Notes (Signed)
Evening meal eaten. 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 Pt spoke with med rec tech at [facility], and with wife by phone.

## 2022-10-10 NOTE — ED Notes (Signed)
Pt belongings found in blur[place] and placed in locker 6 in purple zone.

## 2022-10-10 NOTE — ED Notes (Signed)
Pt is not ivc for now voluntary form attached to the clipboard in orange zone 

## 2022-10-10 NOTE — ED Notes (Signed)
Patient transported to CT 

## 2022-10-10 NOTE — ED Notes (Signed)
Mentions HA, denies other sx, questions or complaints. Speaking on phone with family. 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

## 2022-10-11 DIAGNOSIS — R440 Auditory hallucinations: Secondary | ICD-10-CM

## 2022-10-11 IMAGING — CT CT ABD-PELV W/ CM
2 of 5 series · 16 of 46 positions shown, 18 images · IV contrast (Omni 300)
Comparison: CT abdomen dated 09/22/2018.

CLINICAL DATA: Abdominal pain, acute. Lower abdominal pain with
nausea since last night.

EXAM:
CT ABDOMEN AND PELVIS WITH CONTRAST
TECHNIQUE: Multidetector CT imaging of the abdomen and pelvis was performed
using the standard protocol following bolus administration of
intravenous contrast.
CONTRAST:  77mL OMNIPAQUE IOHEXOL 300 MG/ML  SOLN

[Series 3: a/p w/ 5mm · axial · 0.86mm/px · z∈[-487,-92]mm · 13 of 89 slices shown, 15 images]
[im 5/89  soft-tissue]
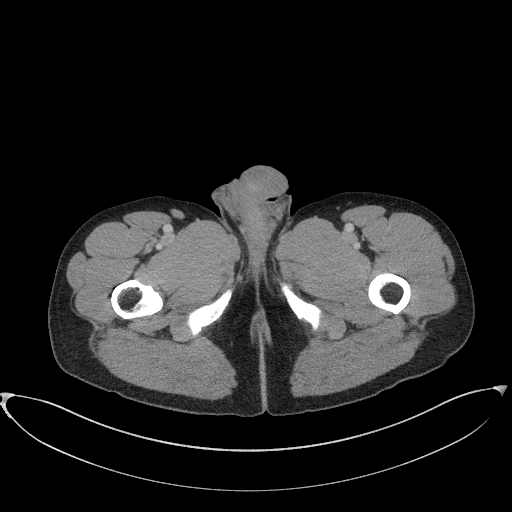
[im 5/89  bone]
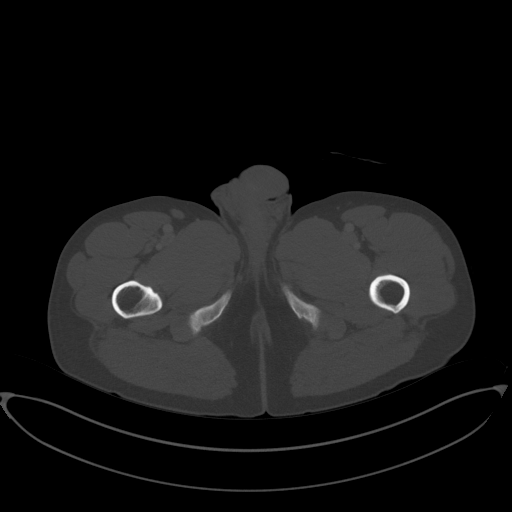
[im 14/89  soft-tissue]
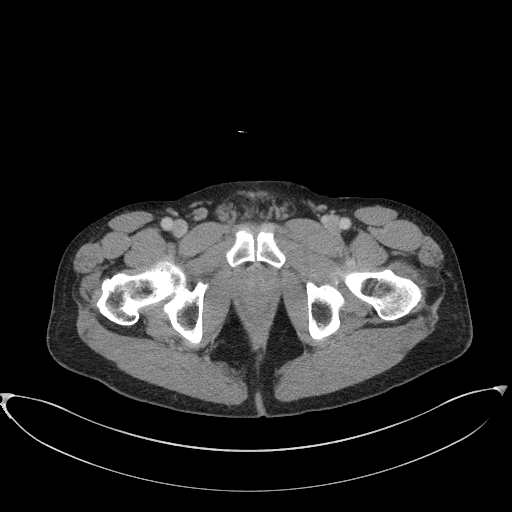
[im 19/89  soft-tissue]
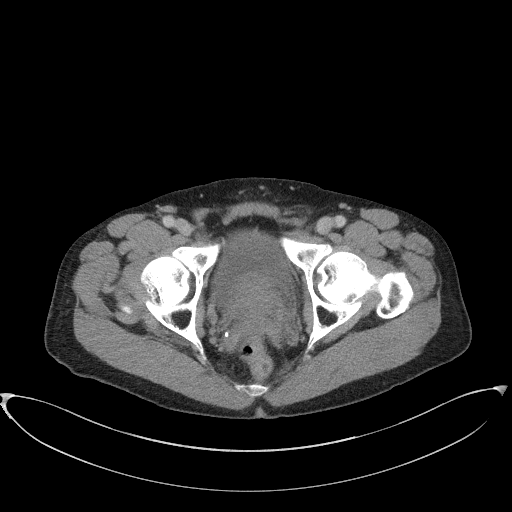
[im 24/89  soft-tissue]
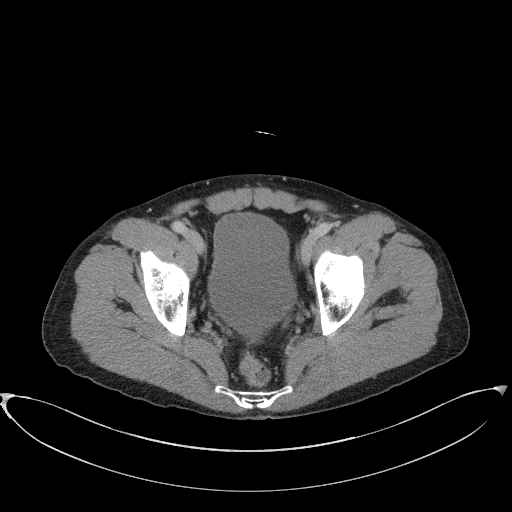
[im 33/89  soft-tissue]
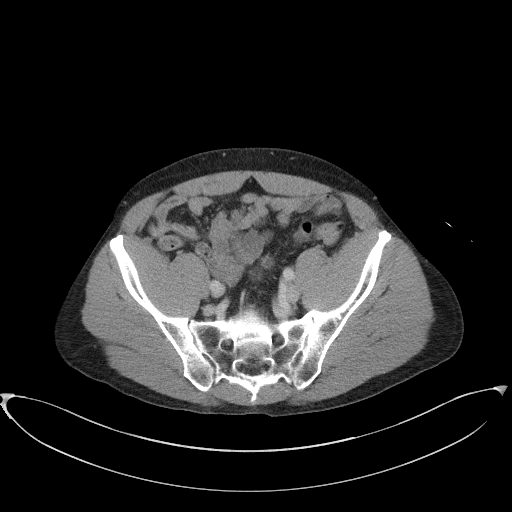
[im 38/89  soft-tissue]
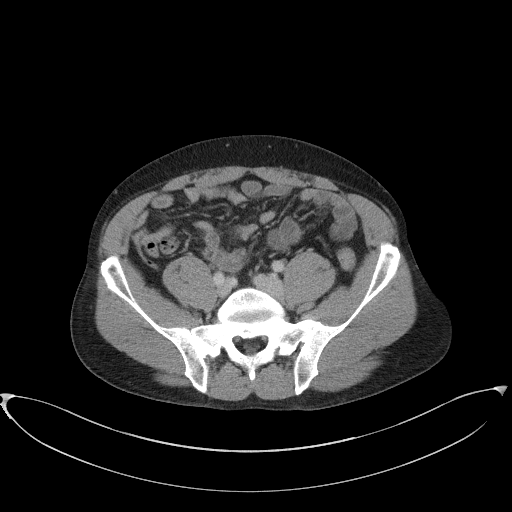
[im 47/89  soft-tissue]
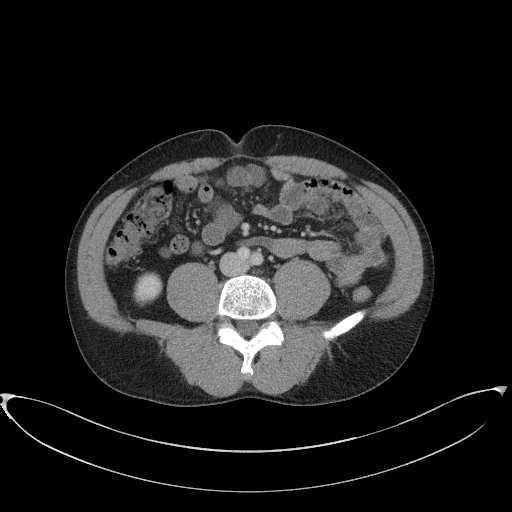
[im 51/89  soft-tissue]
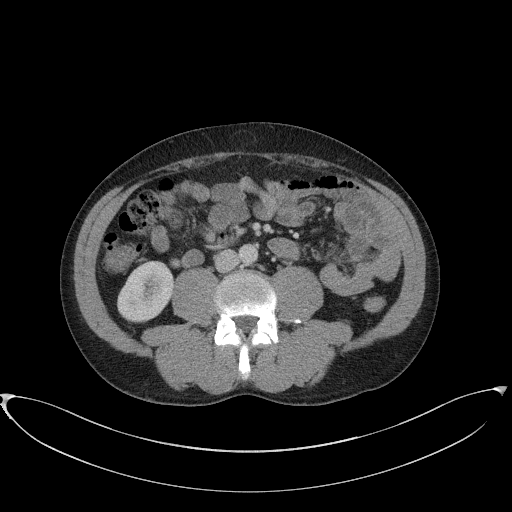
[im 56/89  soft-tissue]
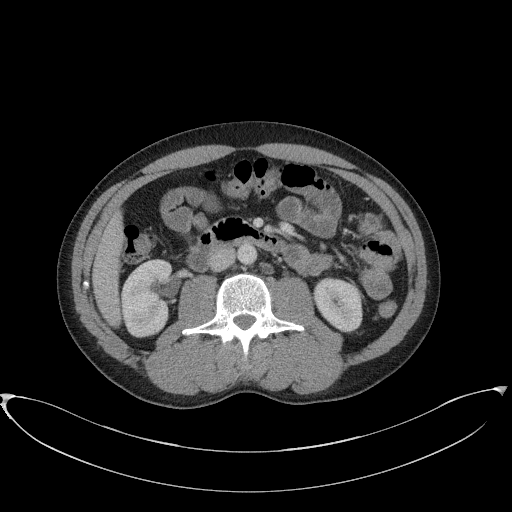
[im 56/89  bone]
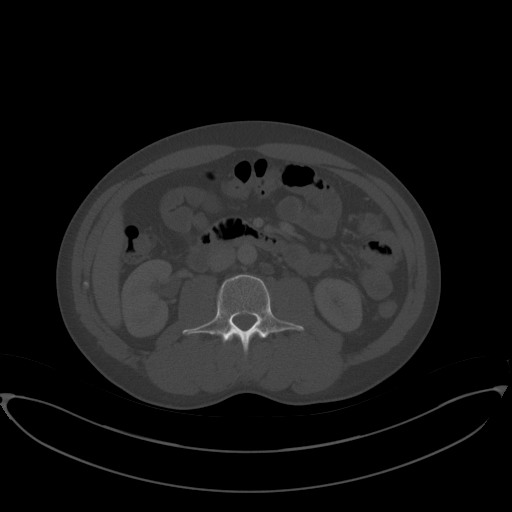
[im 65/89  soft-tissue]
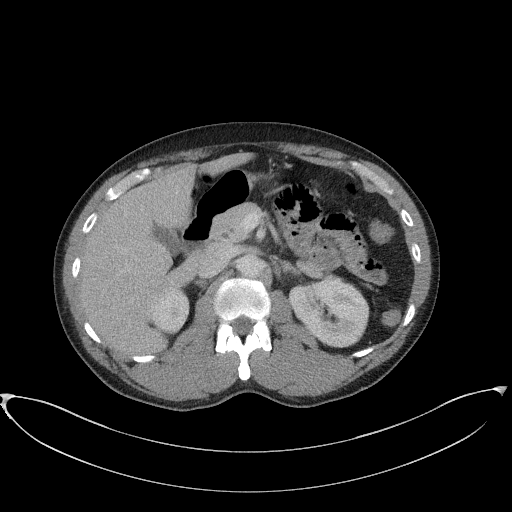
[im 70/89  soft-tissue]
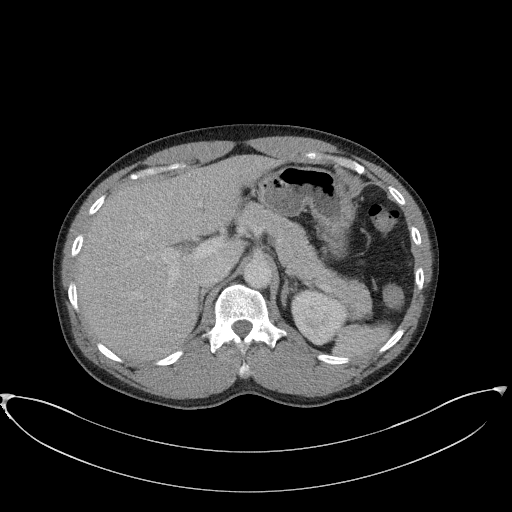
[im 75/89  soft-tissue]
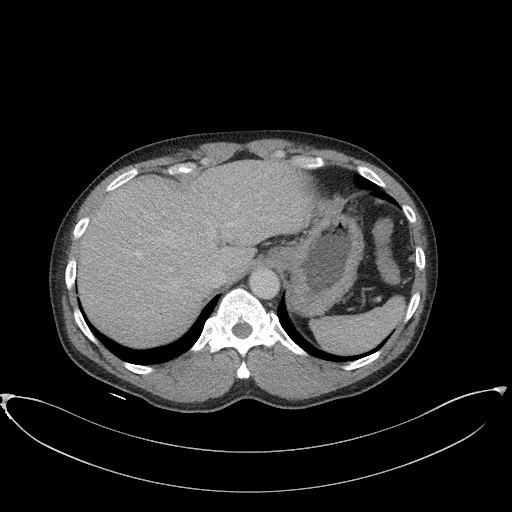
[im 84/89  soft-tissue]
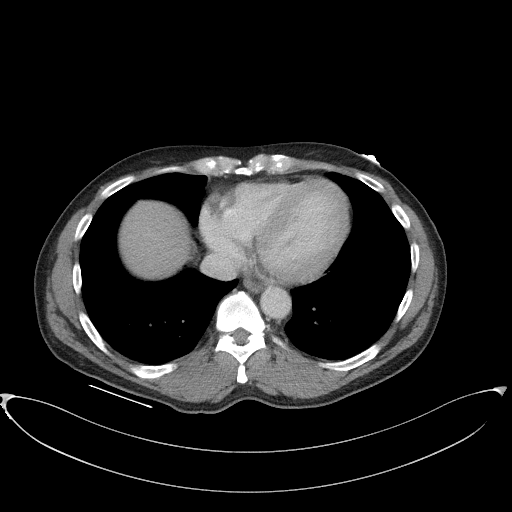

[Series 6: a/p w/ cor · coronal · 0.75mm/px · 3 of 128 slices shown]
[im 43/128  soft-tissue]
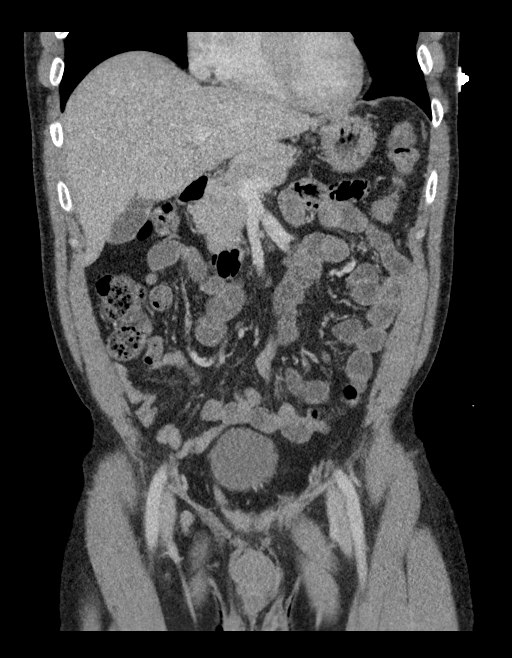
[im 57/128  soft-tissue]
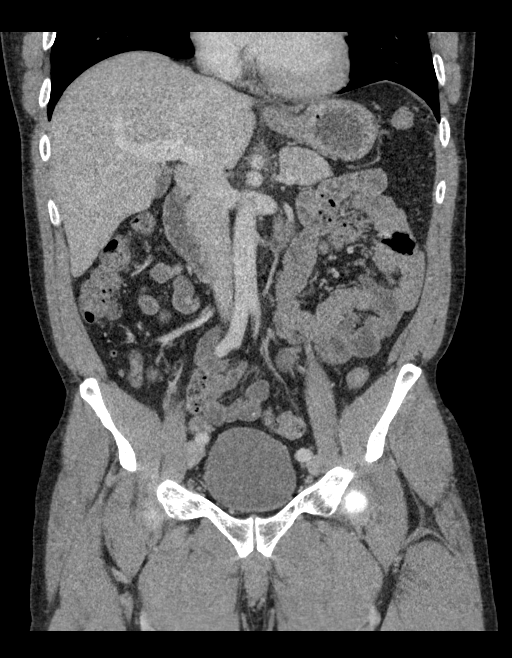
[im 71/128  soft-tissue]
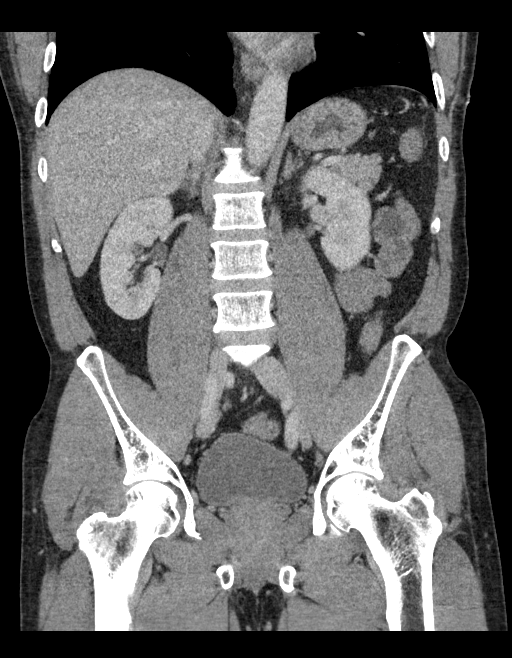

[16 of 46 positions shown; findings below may reference images not displayed]

FINDINGS: Lower chest: No acute abnormality.

Hepatobiliary: No focal liver abnormality is seen. Gallbladder
appears normal. No bile duct dilatation is seen.

Pancreas: Unremarkable. No pancreatic ductal dilatation or
surrounding inflammatory changes.

Spleen: Normal in size without focal abnormality.

Adrenals/Urinary Tract: Adrenal glands are unremarkable. Kidneys are
unremarkable without mass, stone or hydronephrosis. No ureteral or
bladder calculi are identified. Bladder is unremarkable.

Stomach/Bowel: No dilated large or small bowel loops. Scattered mild
diverticulosis of the descending and sigmoid colon but no focal
inflammatory change to suggest acute diverticulitis. Appendix is
normal. No evidence of bowel wall inflammation. Stomach is
unremarkable. Fluid is seen within the small bowel.

Vascular/Lymphatic: No significant vascular findings are present. No
enlarged abdominal or pelvic lymph nodes.

Reproductive: Prostate is unremarkable.

Other: No free fluid or abscess collection. No free intraperitoneal
air.

Musculoskeletal: Small supraumbilical abdominal wall hernia which
contains fat only. Osseous structures are unremarkable.
IMPRESSION: 1. Fluid is seen within the small bowel. This can be a secondary
sign of a mild enteritis of infectious or inflammatory nature. No
bowel obstruction or evidence of bowel wall inflammation.
2. Mild colonic diverticulosis without evidence of acute
diverticulitis.
3. Small supraumbilical abdominal wall hernia which contains fat
only.
4. Remainder of the abdomen and pelvis CT is unremarkable. No
evidence of acute solid organ abnormality. No renal or ureteral
calculi. Appendix is normal.

## 2022-10-11 MED ORDER — RISPERIDONE 2 MG PO TABS
2.0000 mg | ORAL_TABLET | Freq: Two times a day (BID) | ORAL | Status: DC
  Administered 2022-10-11: 2 mg via ORAL
  Filled 2022-10-11 (×2): qty 1

## 2022-10-11 MED ORDER — BENAZEPRIL HCL 5 MG PO TABS
10.0000 mg | ORAL_TABLET | Freq: Every day | ORAL | Status: DC
  Administered 2022-10-11: 10 mg via ORAL
  Filled 2022-10-11: qty 2

## 2022-10-11 MED ORDER — TRAZODONE HCL 50 MG PO TABS: 50.0000 mg | ORAL_TABLET | Freq: Every day | ORAL | Status: DC

## 2022-10-11 MED ORDER — AMLODIPINE BESYLATE 5 MG PO TABS
10.0000 mg | ORAL_TABLET | Freq: Every day | ORAL | Status: DC
  Administered 2022-10-11: 10 mg via ORAL
  Filled 2022-10-11: qty 2

## 2022-10-11 MED ORDER — FLUOXETINE HCL 20 MG PO CAPS
20.0000 mg | ORAL_CAPSULE | Freq: Every day | ORAL | Status: DC
  Administered 2022-10-11: 20 mg via ORAL
  Filled 2022-10-11: qty 1

## 2022-10-11 NOTE — Discharge Instructions (Signed)

## 2022-10-11 NOTE — ED Notes (Signed)
Patient is not IVC'd per RN this morning.

## 2022-10-11 NOTE — ED Provider Notes (Addendum)
Emergency Medicine Observation Re-evaluation Note  DAX MURGUIA is a 56 y.o. male, seen on rounds today.  Pt initially presented to the ED for complaints of Medical Clearance Currently, the patient is resting.  Physical Exam  BP (!) 155/103 (BP Location: Right Arm)   Pulse 78   Temp 98.7 F (37.1 C) (Oral)   Resp 18   SpO2 100%  Physical Exam General: NAD Cardiac: well perfused Lungs: even and unlabored Psych: no agitation  ED Course / MDM  EKG:   I have reviewed the labs performed to date as well as medications administered while in observation.  Recent changes in the last 24 hours include Evaluated by psychiatry yesterday 11/26.  Per notes: Medical Decision Making: Pt case reviewed and discussed with Dr. Gasper Sells. Will recommend for inpatient psychiatric treatment. Pt is currently voluntary and agreeable to plan. CSW notified to fax out patient since there is no availability at [facility]. EDP, RN, and LCSW notified of disposition.   - continue current home medication regimen   Disposition: Recommend psychiatric Inpatient admission when medically cleared.  Plan  Current plan is for inpatient psychiatric admission. Home medications re-ordered. Pt here voluntarily.    1422 Pt psychiatrically cleared by Eligha Bridegroom, NP on repeat assessment. Stable for DC.    Ernie Avena, MD 10/11/22 1423

## 2022-10-11 NOTE — ED Notes (Signed)
Received call from patients wife requesting to speak with patient and if she could come visit patient. This RN explained visitation and phone rules for [facility] patients. Wife verbalized understanding.
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
## 2022-10-31 ENCOUNTER — Ambulatory Visit (HOSPITAL_COMMUNITY)
Admission: EM | Admit: 2022-10-31 | Discharge: 2022-11-01 | Disposition: A | Discharge status: Other Acute Inpatient Hospital | Payer: Medicare Other | Attending: Psychiatry | Admitting: Psychiatry

## 2022-10-31 DIAGNOSIS — Z1152 Encounter for screening for COVID-19: Secondary | ICD-10-CM | POA: Diagnosis not present

## 2022-10-31 DIAGNOSIS — F32A Depression, unspecified: Secondary | ICD-10-CM | POA: Diagnosis not present

## 2022-10-31 DIAGNOSIS — F200 Paranoid schizophrenia: Secondary | ICD-10-CM | POA: Diagnosis present

## 2022-10-31 DIAGNOSIS — R0602 Shortness of breath: Secondary | ICD-10-CM | POA: Diagnosis not present

## 2022-10-31 DIAGNOSIS — B974 Respiratory syncytial virus as the cause of diseases classified elsewhere: Secondary | ICD-10-CM | POA: Diagnosis not present

## 2022-10-31 DIAGNOSIS — R0789 Other chest pain: Secondary | ICD-10-CM | POA: Diagnosis not present

## 2022-10-31 DIAGNOSIS — R062 Wheezing: Secondary | ICD-10-CM | POA: Insufficient documentation

## 2022-10-31 LAB — CBC WITH DIFFERENTIAL/PLATELET
Abs Immature Granulocytes: 0.02 10*3/uL (ref 0.00–0.07)
Basophils Absolute: 0 10*3/uL (ref 0.0–0.1)
Basophils Relative: 0 %
Eosinophils Absolute: 0.2 10*3/uL (ref 0.0–0.5)
Eosinophils Relative: 5 %
HCT: 40.8 % (ref 39.0–52.0)
Hemoglobin: 13.8 g/dL (ref 13.0–17.0)
Immature Granulocytes: 0 %
Lymphocytes Relative: 25 %
Lymphs Abs: 1.1 10*3/uL (ref 0.7–4.0)
MCH: 30.9 pg (ref 26.0–34.0)
MCHC: 33.8 g/dL (ref 30.0–36.0)
MCV: 91.3 fL (ref 80.0–100.0)
Monocytes Absolute: 0.7 10*3/uL (ref 0.1–1.0)
Monocytes Relative: 16 %
Neutro Abs: 2.5 10*3/uL (ref 1.7–7.7)
Neutrophils Relative %: 54 %
Platelets: 231 10*3/uL (ref 150–400)
RBC: 4.47 MIL/uL (ref 4.22–5.81)
RDW: 13 % (ref 11.5–15.5)
WBC: 4.6 10*3/uL (ref 4.0–10.5)
nRBC: 0 % (ref 0.0–0.2)

## 2022-10-31 LAB — LIPID PANEL
Cholesterol: 218 mg/dL — ABNORMAL HIGH (ref 0–200)
HDL: 118 mg/dL (ref >40)
LDL Cholesterol: 90 mg/dL (ref 0–99)
Total CHOL/HDL Ratio: 1.8 RATIO
Triglycerides: 51 mg/dL (ref <150)
VLDL: 10 mg/dL (ref 0–40)

## 2022-10-31 LAB — POCT URINE DRUG SCREEN - MANUAL ENTRY (I-SCREEN)
POC Amphetamine UR: NOT DETECTED
POC Buprenorphine (BUP): NOT DETECTED
POC Cocaine UR: NOT DETECTED
POC Marijuana UR: POSITIVE — AB
POC Methadone UR: NOT DETECTED
POC Methamphetamine UR: NOT DETECTED
POC Morphine: NOT DETECTED
POC Oxazepam (BZO): NOT DETECTED
POC Oxycodone UR: NOT DETECTED
POC Secobarbital (BAR): NOT DETECTED

## 2022-10-31 LAB — COMPREHENSIVE METABOLIC PANEL
ALT: 23 U/L (ref 0–44)
AST: 31 U/L (ref 15–41)
Albumin: 4.1 g/dL (ref 3.5–5.0)
Alkaline Phosphatase: 45 U/L (ref 38–126)
Anion gap: 10 (ref 5–15)
BUN: 11 mg/dL (ref 6–20)
CO2: 24 mmol/L (ref 22–32)
Calcium: 9.5 mg/dL (ref 8.9–10.3)
Chloride: 106 mmol/L (ref 98–111)
Creatinine, Ser: 0.93 mg/dL (ref 0.61–1.24)
GFR, Estimated: >60 mL/min (ref >60)
Glucose, Bld: 155 mg/dL — ABNORMAL HIGH (ref 70–99)
Potassium: 3.6 mmol/L (ref 3.5–5.1)
Sodium: 140 mmol/L (ref 135–145)
Total Bilirubin: 0.4 mg/dL (ref 0.3–1.2)
Total Protein: 7.2 g/dL (ref 6.5–8.1)

## 2022-10-31 LAB — POC SARS CORONAVIRUS 2 AG: SARSCOV2ONAVIRUS 2 AG: NEGATIVE

## 2022-10-31 LAB — TSH: TSH: 0.774 u[IU]/mL (ref 0.350–4.500)

## 2022-10-31 LAB — RESP PANEL BY RT-PCR (RSV, FLU A&B, COVID)  RVPGX2
Influenza A by PCR: NEGATIVE
Influenza B by PCR: NEGATIVE
Resp Syncytial Virus by PCR: POSITIVE — AB
SARS Coronavirus 2 by RT PCR: NEGATIVE

## 2022-10-31 MED ORDER — BENAZEPRIL HCL 5 MG PO TABS
5.0000 mg | ORAL_TABLET | Freq: Every day | ORAL | Status: DC
  Administered 2022-10-31: 5 mg via ORAL
  Filled 2022-10-31: qty 1

## 2022-10-31 MED ORDER — MAGNESIUM HYDROXIDE 400 MG/5ML PO SUSP: 30.0000 mL | Freq: Every day | ORAL | Status: DC | PRN

## 2022-10-31 MED ORDER — ACETAMINOPHEN 325 MG PO TABS
650.0000 mg | ORAL_TABLET | Freq: Four times a day (QID) | ORAL | Status: DC | PRN
  Administered 2022-10-31: 650 mg via ORAL
  Filled 2022-10-31: qty 2

## 2022-10-31 MED ORDER — NICOTINE 21 MG/24HR TD PT24
MEDICATED_PATCH | TRANSDERMAL | Status: AC
  Administered 2022-10-31: 21 mg
  Filled 2022-10-31: qty 1

## 2022-10-31 MED ORDER — HYDROXYZINE HCL 25 MG PO TABS
50.0000 mg | ORAL_TABLET | Freq: Three times a day (TID) | ORAL | Status: DC | PRN
  Administered 2022-10-31: 50 mg via ORAL
  Filled 2022-10-31: qty 2

## 2022-10-31 MED ORDER — RISPERIDONE 1 MG PO TABS
1.0000 mg | ORAL_TABLET | Freq: Two times a day (BID) | ORAL | Status: DC
  Administered 2022-10-31 – 2022-11-01 (×3): 1 mg via ORAL
  Filled 2022-10-31 (×3): qty 1

## 2022-10-31 MED ORDER — ALUM & MAG HYDROXIDE-SIMETH 200-200-20 MG/5ML PO SUSP: 30.0000 mL | ORAL | Status: DC | PRN

## 2022-10-31 MED ORDER — TRAZODONE HCL 50 MG PO TABS
50.0000 mg | ORAL_TABLET | Freq: Every evening | ORAL | Status: DC | PRN
  Administered 2022-10-31: 50 mg via ORAL
  Filled 2022-10-31: qty 1

## 2022-10-31 MED ORDER — NICOTINE 21 MG/24HR TD PT24
21.0000 mg | MEDICATED_PATCH | Freq: Every day | TRANSDERMAL | Status: DC
  Administered 2022-11-01: 21 mg via TRANSDERMAL
  Filled 2022-10-31: qty 1
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
## 2022-10-31 NOTE — ED Provider Notes (Signed)
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   Chloride 07/27/2022 106  98 - 111 mmol/L Final   CO2 07/27/2022 21 (L)  22 - 32 mmol/L Final   Glucose, Bld 07/27/2022 108 (H)  70 - 99 mg/dL Final   Glucose reference range applies only to samples taken after fasting for at least 8 hours.   BUN 07/27/2022 9  6 - 20 mg/dL Final   Creatinine, Ser 07/27/2022 0.99  0.61 - 1.24 mg/dL Final   Calcium 07/27/2022 9.1  8.9 - 10.3 mg/dL Final   GFR, Estimated 07/27/2022 >60  >60 mL/min  Final   Comment: (NOTE) Calculated using the CKD-EPI Creatinine Equation (2021)    Anion gap 07/27/2022 10  5 - 15 Final   Performed at [facility], [place] [address], [place], [place] [address]    Allergies: Spinach, Spinach, and Zofran [ondansetron]  PTA Medications: (Not in a hospital admission)   Medical Decision Making  Voluntary admission to Observation with recommendation for inpatient admission. Amaurie Schreckengost benefit from medication changes. Restart home medications including Risperidone, Trazodone, and Lotensin.     Recommendations  Based on my evaluation the patient does not appear to have an emergency medical condition.  Audrick Lamoureaux, NP 10/31/22  9:23 AM
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
## 2022-10-31 NOTE — ED Notes (Signed)
Pt awake A/O x 4 . Denies SI/HI but contracts for safety. States + AH but does not specify. States it has improved some. No noted resp distress. Monitoring for safety continues

## 2022-10-31 NOTE — ED Notes (Signed)
Pt given lunch meal with roast beef and veggies.

## 2022-11-01 ENCOUNTER — Encounter (HOSPITAL_COMMUNITY): Payer: Self-pay

## 2022-11-01 ENCOUNTER — Emergency Department (HOSPITAL_COMMUNITY)
Admission: EM | Admit: 2022-11-01 | Discharge: 2022-11-05 | Disposition: A | Discharge status: Other Acute Inpatient Hospital | Payer: Medicare Other | Attending: Emergency Medicine | Admitting: Emergency Medicine

## 2022-11-01 ENCOUNTER — Other Ambulatory Visit: Payer: Self-pay

## 2022-11-01 DIAGNOSIS — F29 Unspecified psychosis not due to a substance or known physiological condition: Secondary | ICD-10-CM | POA: Diagnosis not present

## 2022-11-01 DIAGNOSIS — F32A Depression, unspecified: Secondary | ICD-10-CM | POA: Diagnosis not present

## 2022-11-01 DIAGNOSIS — R45851 Suicidal ideations: Secondary | ICD-10-CM | POA: Diagnosis not present

## 2022-11-01 DIAGNOSIS — F200 Paranoid schizophrenia: Secondary | ICD-10-CM | POA: Diagnosis not present

## 2022-11-01 DIAGNOSIS — J210 Acute bronchiolitis due to respiratory syncytial virus: Secondary | ICD-10-CM | POA: Insufficient documentation

## 2022-11-01 DIAGNOSIS — F251 Schizoaffective disorder, depressive type: Secondary | ICD-10-CM | POA: Diagnosis present

## 2022-11-01 DIAGNOSIS — F209 Schizophrenia, unspecified: Secondary | ICD-10-CM | POA: Diagnosis present

## 2022-11-01 DIAGNOSIS — F333 Major depressive disorder, recurrent, severe with psychotic symptoms: Secondary | ICD-10-CM | POA: Diagnosis not present

## 2022-11-01 DIAGNOSIS — R4689 Other symptoms and signs involving appearance and behavior: Secondary | ICD-10-CM | POA: Diagnosis present

## 2022-11-01 MED ORDER — BENAZEPRIL HCL 5 MG PO TABS: 5.0000 mg | ORAL_TABLET | Freq: Every day | ORAL | Status: DC

## 2022-11-01 MED ORDER — RISPERIDONE 1 MG PO TABS: 1.0000 mg | ORAL_TABLET | Freq: Two times a day (BID) | ORAL | Status: DC

## 2022-11-01 NOTE — ED Provider Triage Note (Signed)
Emergency Medicine Provider Triage Evaluation Note  Damon [name] , a 56 y.o. male  was evaluated in triage.  Pt sent from [facility] with + RSV test.  He has been recommended for inpatient treatment for schizophrenia.  States that he had had a cough until yesterday evening when he developed worsening congestion and bodyaches.  No difficulty breathing.  He reports "on and off fevers".  Per [facility] note, patient sent to ED until he can be placed.   Review of Systems  Positive: Congestion, cough, body aches Negative: Chest pain  Physical Exam  There were no vitals taken for this visit. Gen:   Awake, no distress   Resp:  Normal effort  MSK:   Moves extremities without difficulty  Other:    Medical Decision Making  Medically screening exam initiated at 1:09 PM.  Appropriate orders placed.  [name] was informed that the remainder of the evaluation will be completed by another provider, this initial triage assessment does not replace that evaluation, and the importance of remaining in the ED until their evaluation is complete.     Renne Crigler, PA-C 11/01/22 1311

## 2022-11-01 NOTE — ED Provider Notes (Cosign Needed Addendum)
FBC/OBS ASAP Discharge Summary  Date and Time: 11/01/2022 10:22 AM  Name: Damon [name]  MRN:  [id number]   Discharge Diagnoses:  Final diagnoses:  Schizophrenia, paranoid type [facility])    Subjective: Damon [name] 56 y.o., male patient presented to [facility] on 10/31/2022 with SI/HI/AVH and paranoia. He was admitted to the continuous assessment unit and recommended for IP admission.  Damon [name], 56 y.o., male patient seen face to face by this provider, consulted with Dr. Viviano Simas; and chart reviewed on 11/01/22.  Per chart review he has a past psychiatric history of schizophrenia paranoid type and depression. UDS positive for THC on admission.  He has out patient services in place with [facility].   Per chart review, patient is positive for RSV.    On today's evaluation Damon [name] is observed laying in his bed awake in no acute distress. He is alert/oriented x 4, calm, cooperative and attentive. He is speaking in a clear tone and normal rate. He endorses depression with feelings of helplessness, low motivation, with decreased energy, appetite and sleep. He has a depressed affect. He is complaining of SOB, chest tightness, and cough with green thick sputum. He has decreased lung sounds with some wheezing in upper lobes. He has no fever present. BP is slightly elevated.He continues to endorse AH with a male voice that tells him to kill himself. He denies VH.  He endorsing SI with out a specific plan and he can not verbally contract for safety. He endorses HI towards some people ( he did not disclose whom), with out a plan. He is paranoid. He denies access to firearms/weapons. He does not appear to be responding to internal/external stimuli.    Stay Summary: Patient meets criteria for IP psychiatric admission.He is positive for RSV and is symptomatic. He will be transferred to the [facility] while awaiting IP psychiatric admission.   Total Time spent with patient: 30 minutes  Past Psychiatric  History: see H&P Past Medical History:  Past Medical History:  Diagnosis Date   Depression    Hypertension    Paranoid schizophrenia (HCC)    Schizophrenia (HCC)    No past surgical history on file. Family History:  Family History  Problem Relation Age of Onset   Colon cancer Mother    Hyperlipidemia Father    Heart failure Brother    Diabetes Other    Stroke Other    Family Psychiatric History: See H&P Social History:  Social History   Substance and Sexual Activity  Alcohol Use Yes   Alcohol/week: 1.0 standard drink of alcohol   Types: 1 Cans of beer per week   Comment: one can of beer weekly     Social History   Substance and Sexual Activity  Drug Use No    Social History   Socioeconomic History   Marital status: Single    Spouse name: Not on file   Number of children: Not on file   Years of education: Not on file   Highest education level: Not on file  Occupational History   Not on file  Tobacco Use   Smoking status: Never   Smokeless tobacco: Never  Substance and Sexual Activity   Alcohol use: Yes    Alcohol/week: 1.0 standard drink of alcohol    Types: 1 Cans of beer per week    Comment: one can of beer weekly   Drug use: No   Sexual activity: Yes    Birth control/protection: Condom  Other  Topics Concern   Not on file  Social History Narrative   ** Merged History Encounter **       Social Determinants of Health   Financial Resource Strain: Not on file  Food Insecurity: Not on file  Transportation Needs: Not on file  Physical Activity: Not on file  Stress: Not on file  Social Connections: Not on file   SDOH:  SDOH Screenings   Alcohol Screen: Low Risk  (10/12/2017)  Tobacco Use: Low Risk  (10/10/2022)    Tobacco Cessation:  Prescription not provided because: patient is being transferred to [facility]   Current Medications:  Current Facility-Administered Medications  Medication Dose Route Frequency Provider Last Rate Last Admin    acetaminophen (TYLENOL) tablet 650 mg  650 mg Oral Q6H PRN May, Tanya, NP   650 mg at 10/31/22 1649   alum & mag hydroxide-simeth (MAALOX/MYLANTA) 200-200-20 MG/5ML suspension 30 mL  30 mL Oral Q4H PRN May, Tanya, NP       benazepril (LOTENSIN) tablet 5 mg  5 mg Oral Daily May, Tanya, NP   5 mg at 10/31/22 1045   hydrOXYzine (ATARAX) tablet 50 mg  50 mg Oral TID PRN May, Tanya, NP   50 mg at 10/31/22 2130   magnesium hydroxide (MILK OF MAGNESIA) suspension 30 mL  30 mL Oral Daily PRN May, Tanya, NP       nicotine (NICODERM CQ - dosed in mg/24 hours) patch 21 mg  21 mg Transdermal Daily May, Tanya, NP   21 mg at 11/01/22 1012   risperiDONE (RISPERDAL) tablet 1 mg  1 mg Oral BID May, Tanya, NP   1 mg at 11/01/22 1012   traZODone (DESYREL) tablet 50 mg  50 mg Oral QHS PRN May, Tanya, NP   50 mg at 10/31/22 2130   Current Outpatient Medications  Medication Sig Dispense Refill   acetaminophen (TYLENOL) 325 MG tablet Take 650 mg by mouth every 6 (six) hours as needed for mild pain or headache.     amLODipine (NORVASC) 10 MG tablet Take 1 tablet (10 mg total) by mouth daily. 90 tablet 1   benazepril (LOTENSIN) 10 MG tablet Take 1 tablet (10 mg total) by mouth daily. For blood pressure. 30 tablet 2   ibuprofen (ADVIL) 200 MG tablet Take 200 mg by mouth every 6 (six) hours as needed for headache or mild pain.     methocarbamol (ROBAXIN) 500 MG tablet Take 1 tablet (500 mg total) by mouth every 6 (six) hours as needed for muscle spasms. 30 tablet 0   risperiDONE (RISPERDAL) 2 MG tablet Take 1 tablet (2 mg total) by mouth 2 (two) times daily. For mood control 60 tablet 0   traZODone (DESYREL) 50 MG tablet Take 1 tablet (50 mg total) by mouth at bedtime as needed for sleep. (Patient taking differently: Take 50 mg by mouth at bedtime.) 30 tablet 0    PTA Medications: (Not in a hospital admission)       No data to display          Flowsheet Row ED from 10/31/2022 in [facility] ED from 10/10/2022 in [facility] EMERGENCY DEPARTMENT ED from 09/03/2022 in [facility] at [facility] RISK CATEGORY Moderate Risk No Risk No Risk       Musculoskeletal  Strength & Muscle Tone: within normal limits Gait & Station: normal Patient leans: N/A  Psychiatric Specialty Exam  Presentation  General Appearance:  Appropriate for Environment; Casual  Eye Contact: Good  Speech: Clear and Coherent; Normal Rate  Speech Volume: Normal  Handedness: Right   Mood and Affect  Mood: Depressed  Affect: Congruent   Thought Process  Thought Processes: Coherent  Descriptions of Associations:Intact  Orientation:Full (Time, Place and Person)  Thought Content:Logical  Diagnosis of Schizophrenia or Schizoaffective disorder in past: No  Duration of Psychotic Symptoms: Greater than six months   Hallucinations:Hallucinations: Auditory Description of Auditory Hallucinations: voices telling him to hurt himself and others  Ideas of Reference:Paranoia  Suicidal Thoughts:Suicidal Thoughts: Yes, Passive SI Passive Intent and/or Plan: With Intent; Without Plan; Without Access to Means  Homicidal Thoughts:Homicidal Thoughts: Yes, Passive HI Passive Intent and/or Plan: Without Intent; Without Plan; Without Access to Means   Sensorium  Memory: Immediate Good; Recent Good; Remote Good  Judgment: Fair  Insight: Fair   Executive Functions  Concentration: Good  Attention Span: Good  Recall: Good  Fund of Knowledge: Good  Language: Good   Psychomotor Activity  Psychomotor Activity: Psychomotor Activity: Normal   Assets  Assets: Communication Skills; Desire for Improvement; Physical Health; Resilience   Sleep  Sleep: Sleep: Fair   Nutritional Assessment (For OBS and FBC admissions only) Has the patient had a weight loss or gain of 10 pounds or more in the last 3 months?: No Has the patient had a  decrease in food intake/or appetite?: No Does the patient have dental problems?: No Does the patient have eating habits or behaviors that may be indicators of an eating disorder including binging or inducing vomiting?: No Has the patient recently lost weight without trying?: 0 Has the patient been eating poorly because of a decreased appetite?: 0 Malnutrition Screening Tool Score: 0    Physical Exam  Physical Exam Vitals and nursing note reviewed.  Constitutional:      General: He is not in acute distress.    Appearance: He is well-developed.  HENT:     Head: Normocephalic and atraumatic.  Eyes:     General:        Right eye: No discharge.        Left eye: No discharge.  Cardiovascular:     Rate and Rhythm: Normal rate.  Pulmonary:     Effort: Pulmonary effort is normal.     Breath sounds: Wheezing present.       Comments: Complains of SOB, chest tightness and cough with thick green sputum .Marland Kitchen. Wheezing on exhalation in upper bilateral lobes  Musculoskeletal:        General: Normal range of motion.     Cervical back: Normal range of motion.  Skin:    Coloration: Skin is not jaundiced or pale.  Neurological:     Mental Status: He is alert and oriented to person, place, and time.  Psychiatric:        Attention and Perception: Attention normal. He perceives auditory hallucinations.        Mood and Affect: Mood is depressed.        Speech: Speech normal.        Behavior: Behavior normal. Behavior is cooperative.        Thought Content: Thought content includes homicidal and suicidal ideation. Thought content does not include homicidal or suicidal plan.        Cognition and Memory: Cognition normal.        Judgment: Judgment normal.    Review of Systems  Constitutional: Negative.   HENT: Negative.    Eyes: Negative.  Respiratory: Negative.    Cardiovascular: Negative.   Genitourinary: Negative.   Musculoskeletal: Negative.   Skin: Negative.   Neurological: Negative.    Psychiatric/Behavioral:  Positive for depression, hallucinations and suicidal ideas.    Blood pressure (!) 143/91, pulse 90, temperature 98.6 F (37 C), temperature source Oral, resp. rate 18, SpO2 100 %. There is no height or weight on file to calculate BMI.   Disposition:   Patient will be transferred to the [facility] Dr. Jacqulyn Bath accepting MD while awaiting IP bed availability.   Patient is also positive for RSV and is symptomatic, he can not be held at [facility] due to open Milieu.  Discussed Case with Dr. Gretta Cool and she is in agreement to transfer patient.   Ardis Hughs, NP 11/01/2022, 10:22 AM

## 2022-11-01 NOTE — ED Notes (Signed)
Resting in recliner bed. Resp even and unlabored. Monitoring for safety continues.

## 2022-11-01 NOTE — Progress Notes (Signed)
Received Damon [name] this AM awake on his chair bed. He ate breakfast and was compliant with his medications. He endorsed difficulty sleeping last night related to breathing difficulty. He was informed he has a diagnosis of RSV.  NP aware  and assessed patient. He endorsed feeling depressed and fleeting thoughts of suicidal without a plan.

## 2022-11-01 NOTE — Progress Notes (Addendum)
[facility] Emergent arrived to take him to [facility] per order related to RSV.

## 2022-11-01 NOTE — ED Notes (Signed)
Pt is sleeping in recliner bed.  Resp even and unlabored

## 2022-11-01 NOTE — Discharge Instructions (Addendum)
Transfer to [facility] accepting MD is Dr. Jacqulyn Bath

## 2022-11-01 NOTE — ED Notes (Signed)
No answer x2 

## 2022-11-01 NOTE — ED Triage Notes (Addendum)
Pt from [facility] with [facility], pt tested positive for RSV, at [facility] voluntarily for SI. Pt a.o. pt requesting to go back to [facility] when d/c

## 2022-11-01 NOTE — Progress Notes (Signed)
Report called to [facility], the charge nurse at [facility]. Report given. [facility] called.

## 2022-11-02 MED ORDER — TRAZODONE HCL 50 MG PO TABS
50.0000 mg | ORAL_TABLET | Freq: Every day | ORAL | Status: DC
  Administered 2022-11-02 – 2022-11-04 (×3): 50 mg via ORAL
  Filled 2022-11-02 (×4): qty 1

## 2022-11-02 MED ORDER — IBUPROFEN 200 MG PO TABS
200.0000 mg | ORAL_TABLET | Freq: Four times a day (QID) | ORAL | Status: DC | PRN
  Administered 2022-11-03 – 2022-11-05 (×2): 200 mg via ORAL
  Filled 2022-11-02 (×2): qty 1

## 2022-11-02 MED ORDER — ACETAMINOPHEN 500 MG PO TABS
1000.0000 mg | ORAL_TABLET | Freq: Once | ORAL | Status: AC
  Administered 2022-11-02: 1000 mg via ORAL
  Filled 2022-11-02: qty 2

## 2022-11-02 MED ORDER — NICOTINE 21 MG/24HR TD PT24
21.0000 mg | MEDICATED_PATCH | Freq: Every day | TRANSDERMAL | Status: DC
  Administered 2022-11-02 – 2022-11-05 (×4): 21 mg via TRANSDERMAL
  Filled 2022-11-02 (×4): qty 1

## 2022-11-02 MED ORDER — BENAZEPRIL HCL 5 MG PO TABS
5.0000 mg | ORAL_TABLET | Freq: Every day | ORAL | Status: DC
  Administered 2022-11-02 – 2022-11-05 (×4): 5 mg via ORAL
  Filled 2022-11-02 (×5): qty 1

## 2022-11-02 MED ORDER — RISPERIDONE 1 MG PO TABS
1.0000 mg | ORAL_TABLET | Freq: Two times a day (BID) | ORAL | Status: DC
  Administered 2022-11-02 – 2022-11-05 (×7): 1 mg via ORAL
  Filled 2022-11-02 (×8): qty 1

## 2022-11-02 NOTE — ED Notes (Signed)
RN provided pt a beverage. 

## 2022-11-02 NOTE — BH Assessment (Addendum)
12/19: @2200  As per , the patient can be admitted to [facility] on November 05, 2022, at any time after 8 a.m. Due to the + RSV results on October 31, 2022, the facility's acceptance date was extended. Dr. November 02, 2022 is the accepting provider. Nurse report [phone] (pager #).   Patient's nurse #[phone], RN) provided disposition updates.

## 2022-11-02 NOTE — ED Provider Notes (Signed)
[facility] EMERGENCY DEPARTMENT Provider Note   CSN: [id number] Arrival date & time: 11/01/22  1214     History  Chief Complaint  Patient presents with   RSV   Suicidal    Damon [name] is a 56 y.o. male.  HPI Patient presents from our affiliated behavioral health urgent care due to concern for suicidal ideation and positive RSV test.  Patient notes that over the past 3 to 4 days, he has had URI-like illness with myalgia.  He notes that his son was tested positive for RSV last week. He went to urgent care due to concern for suicidal ideation.  There he was seen, evaluated, recommendation for inpatient behavioral health, but he was sent here due to RSV positive status.  Patient is a non-smoker, denies dyspnea.    Home Medications Prior to Admission medications   Medication Sig Start Date End Date Taking? Authorizing Provider  acetaminophen (TYLENOL) 325 MG tablet Take 650 mg by mouth every 6 (six) hours as needed for mild pain or headache.    [provider]  amLODipine (NORVASC) 10 MG tablet Take 1 tablet (10 mg total) by mouth daily. 09/15/21   Blanchie Dessert, MD  benazepril (LOTENSIN) 5 MG tablet Take 1 tablet (5 mg total) by mouth daily. 11/01/22   Revonda Humphrey, NP  ibuprofen (ADVIL) 200 MG tablet Take 200 mg by mouth every 6 (six) hours as needed for headache or mild pain.    [provider]  risperiDONE (RISPERDAL) 1 MG tablet Take 1 tablet (1 mg total) by mouth 2 (two) times daily. 11/01/22   Revonda Humphrey, NP  traZODone (DESYREL) 50 MG tablet Take 1 tablet (50 mg total) by mouth at bedtime as needed for sleep. Patient taking differently: Take 50 mg by mouth at bedtime. 10/14/17   Encarnacion Slates, NP      Allergies    Spinach and Zofran [ondansetron]    Review of Systems   Review of Systems  All other systems reviewed and are negative.   Physical Exam Updated Vital Signs BP (!) 140/92   Pulse (!) 102   Temp 98.1  F (36.7 C) (Oral)   Resp 17   SpO2 100%  Physical Exam Vitals and nursing note reviewed.  Constitutional:      General: He is not in acute distress.    Appearance: He is well-developed.  HENT:     Head: Normocephalic and atraumatic.  Eyes:     Conjunctiva/sclera: Conjunctivae normal.  Cardiovascular:     Rate and Rhythm: Normal rate and regular rhythm.  Pulmonary:     Effort: Pulmonary effort is normal. No respiratory distress.     Breath sounds: No stridor.  Abdominal:     General: There is no distension.  Skin:    General: Skin is warm and dry.  Neurological:     Mental Status: He is alert and oriented to person, place, and time.  Psychiatric:        Mood and Affect: Mood normal.     Comments: Suicidal ideation     ED Results / Procedures / Treatments   Labs (all labs ordered are listed, but only abnormal results are displayed) Labs Reviewed - No data to display  EKG None  Radiology No results found.  Procedures Procedures    Medications Ordered in ED Medications - No data to display  ED Course/ Medical Decision Making/ A&P  Medical Decision Making Multiple medical issues including schizophrenia, depression, presents with suicidal ideation and viral syndrome.  Patient is awake, alert, not hypoxic, not febrile.  He is mildly tachycardic, but no evidence for bacteremia, sepsis.  RSV positive is not unusual at this time of year, and the patient has a positive sick contact his son.  Patient's labs reviewed, he received Tylenol, he is medically cleared for behavioral health inpatient placement.  Amount and/or Complexity of Data Reviewed External Data Reviewed: notes. Labs:  Decision-making details documented in ED Course.  Risk OTC drugs. Decision regarding hospitalization.  Final Clinical Impression(s) / ED Diagnoses Final diagnoses:  Suicidal ideation  RSV (acute bronchiolitis due to respiratory syncytial virus)      Gerhard Munch, MD 11/02/22 1050

## 2022-11-02 NOTE — ED Notes (Signed)
Wife Rilee Wendling [phone] would like a call when her husband is discharged

## 2022-11-03 NOTE — ED Notes (Signed)
Pt is voluntary at this time

## 2022-11-03 NOTE — ED Provider Notes (Signed)
Emergency Medicine Observation Re-evaluation Note  Damon [name] is a 56 y.o. male, seen on rounds today.  Pt initially presented to the ED for complaints of RSV and Suicidal Currently, the patient is awake alert eating breakfast.  Physical Exam  BP (!) 173/95 (BP Location: Right Arm)   Pulse 78   Temp 98.3 F (36.8 C)   Resp 18   SpO2 100%  Physical Exam General: No acute distress Cardiac: Regular rate Lungs: Normal respiratory effort Psych:   ED Course / MDM  EKG:   I have reviewed the labs performed to date as well as medications administered while in observation.  Recent changes in the last 24 hours include no acute events.  Plan  Current plan is for inpatient treatment.  Patient will be accepted on the 22nd because of his recent RSV diagnosis.  Noted to be hypertensive this morning.  He is on medication.  Will monitor    Damon Dibbles, MD 11/03/22 [phone]

## 2022-11-04 DIAGNOSIS — F200 Paranoid schizophrenia: Secondary | ICD-10-CM | POA: Diagnosis not present

## 2022-11-04 DIAGNOSIS — R45851 Suicidal ideations: Secondary | ICD-10-CM | POA: Diagnosis not present

## 2022-11-04 DIAGNOSIS — F333 Major depressive disorder, recurrent, severe with psychotic symptoms: Secondary | ICD-10-CM

## 2022-11-04 NOTE — ED Notes (Signed)
Voluntary at this time per chart review

## 2022-11-04 NOTE — ED Provider Notes (Signed)
Emergency Medicine Observation Re-evaluation Note  Damon [name] is a 56 y.o. male, seen on rounds today.  Pt initially presented to the ED for complaints of RSV and Suicidal Currently, the patient is sleeping comfortably.  Physical Exam  BP (!) 151/97   Pulse 76   Temp 98.4 F (36.9 C) (Oral)   Resp 18   SpO2 98%  Physical Exam General: No distress Cardiac: regular rate Lungs: clear Psych: asleep  ED Course / MDM  EKG:   I have reviewed the labs performed to date as well as medications administered while in observation.  Recent changes in the last 24 hours include none.  Plan  Current plan is for Admit to inpatient psychiatry tomorrow due to previous positive RSV test.    Lonell Grandchild, MD 11/04/22 [phone]

## 2022-11-04 NOTE — Consult Note (Signed)
[facility] ED ASSESSMENT   Reason for Consult:  Psychiatric Consult SI and Hallucinations  Referring Physician:  Dr. Gerhard Munch Patient Identification: Damon [name] MRN:  [id number] ED Chief Complaint: Suicidal ideation  Diagnosis:  Principal Problem:   Suicidal ideation Active Problems:   Depression   Schizophrenia [facility])   ED Assessment Time Calculation: Start Time: 0830 Stop Time: 0850 Total Time in Minutes (Assessment Completion): 20   Subjective:   Damon [name] is a 56 y.o. male with a history of schizophrenia, patient admitted to [facility] following a transfer from [facility] after presenting voluntarily with reports of suicidal ideations, auditory hallucinations, and homicidal ideations without a plan. Patient was initially admitted to [facility], however was transferred to the ED after testing positive for RSV.  Patient is recommended for inpatient psychiatric treatment and has been accepted at [facility] with arrival date of 11/06/2019 8:00 am.  HPI:   [name], 56 year old male, evaluated face-to-face per TTS consult for psychiatric evaluation.  On evaluation today patient continues to endorse SI and HI without a specific plan.  Patient endorses thoughts of HI however denies that his homicidal ideations are intended for anyone in particular.  Patient is currently prescribed Risperdal 1 mg twice daily and had been reportedly off of his home medications prior to this current mental health crisis.  Patient also endorses poor quality of sleep with frequent nighttime awakenings with an average of sleep of 4 hours.  He endorses ongoing auditory hallucinations although he reports they have improved since his initial presentation 2 days ago.  He endorses that the voices are still telling him to end his life.  Patient is accepting of the plan for inpatient psychiatric treatment and remains voluntary.  During evaluation Damon [name] is laying in bed with the head of bed elevated without acute  distress. He is alert, oriented x 4, calm, cooperative and attentive.  His mood is depressed with a congruent affect. She has normal speech, and behavior.  Objectively there is no evidence of psychosis/mania or delusional thinking.  Patient is able to converse coherently, goal directed thoughts, no distractibility, or pre-occupation. He continues to endorse suicidal ideations and homicidal ideations without a specific plan. He exhibit some paranoia and endorses auditory hallucinations, however does not appear to be responding to internal stimuli. Patient continues to meet criteria for inpatient psychiatric treatment as he is unable to contract for safety.        Risk to Self or Others: Is the patient at risk to self? Yes Has the patient been a risk to self in the past 6 months? Yes Has the patient been a risk to self within the distant past? Yes Is the patient a risk to others? Yes Has the patient been a risk to others in the past 6 months? Yes Has the patient been a risk to others within the distant past? No  [place] Scale:  Flowsheet Row ED from 11/01/2022 in [facility] EMERGENCY DEPARTMENT ED from 10/31/2022 in [facility] ED from 10/10/2022 in [facility] EMERGENCY DEPARTMENT  C-SSRS RISK CATEGORY No Risk Moderate Risk No Risk       AIMS:  , , ,  ,   ASAM:    Substance Abuse:     Past Medical History:  Past Medical History:  Diagnosis Date   Depression    Hypertension    Paranoid schizophrenia (HCC)    Schizophrenia (HCC)    History reviewed. No  pertinent surgical history. Family History:  Family History  Problem Relation Age of Onset   Colon cancer Mother    Hyperlipidemia Father    Heart failure Brother    Diabetes Other    Stroke Other    Social History:  Social History   Substance and Sexual Activity  Alcohol Use Yes   Alcohol/week: 1.0 standard drink of alcohol   Types: 1 Cans of beer per week    Comment: one can of beer weekly     Social History   Substance and Sexual Activity  Drug Use No    Social History   Socioeconomic History   Marital status: Single    Spouse name: Not on file   Number of children: Not on file   Years of education: Not on file   Highest education level: Not on file  Occupational History   Not on file  Tobacco Use   Smoking status: Never   Smokeless tobacco: Never  Substance and Sexual Activity   Alcohol use: Yes    Alcohol/week: 1.0 standard drink of alcohol    Types: 1 Cans of beer per week    Comment: one can of beer weekly   Drug use: No   Sexual activity: Yes    Birth control/protection: Condom  Other Topics Concern   Not on file  Social History Narrative   ** Merged History Encounter **       Social Determinants of Health   Financial Resource Strain: Not on file  Food Insecurity: Not on file  Transportation Needs: Not on file  Physical Activity: Not on file  Stress: Not on file  Social Connections: Not on file   Additional Social History:    Allergies:   Allergies  Allergen Reactions   Spinach Hives   Zofran [Ondansetron] Rash    Labs: No results found for this or any previous visit (from the past 48 hour(s)).  Current Facility-Administered Medications  Medication Dose Route Frequency Provider Last Rate Last Admin   benazepril (LOTENSIN) tablet 5 mg  5 mg Oral Daily Gerhard Munch, MD   5 mg at 11/04/22 0842   ibuprofen (ADVIL) tablet 200 mg  200 mg Oral Q6H PRN Gerhard Munch, MD   200 mg at 11/03/22 7616   nicotine (NICODERM CQ - dosed in mg/24 hours) patch 21 mg  21 mg Transdermal Daily Benjiman Core, MD   21 mg at 11/04/22 0844   risperiDONE (RISPERDAL) tablet 1 mg  1 mg Oral BID Gerhard Munch, MD   1 mg at 11/04/22 0737   traZODone (DESYREL) tablet 50 mg  50 mg Oral Jerolyn Shin, MD   50 mg at 11/03/22 2247   Current Outpatient Medications  Medication Sig Dispense Refill   acetaminophen  (TYLENOL) 325 MG tablet Take 650 mg by mouth every 6 (six) hours as needed for mild pain or headache.     amLODipine (NORVASC) 10 MG tablet Take 1 tablet (10 mg total) by mouth daily. 90 tablet 1   benazepril (LOTENSIN) 5 MG tablet Take 1 tablet (5 mg total) by mouth daily.     ibuprofen (ADVIL) 200 MG tablet Take 200 mg by mouth every 6 (six) hours as needed for headache or mild pain.     risperiDONE (RISPERDAL) 1 MG tablet Take 1 tablet (1 mg total) by mouth 2 (two) times daily.     traZODone (DESYREL) 50 MG tablet Take 1 tablet (50 mg total) by mouth at bedtime as needed for  sleep. (Patient taking differently: Take 50 mg by mouth at bedtime.) 30 tablet 0      Psychiatric Specialty Exam: Presentation  General Appearance:  Appropriate for Environment  Eye Contact: Fair  Speech: Clear and Coherent  Speech Volume: Normal  Handedness: Right   Mood and Affect  Mood: Depressed; Anxious  Affect: Depressed   Thought Process  Thought Processes: Coherent  Descriptions of Associations:Intact  Orientation:Full (Time, Place and Person)  Thought Content:Logical  History of Schizophrenia/Schizoaffective disorder:Yes  Duration of Psychotic Symptoms:Greater than six months  Hallucinations:Hallucinations: Auditory Description of Auditory Hallucinations: voices telling him to hurt himself  Ideas of Reference:Paranoia  Suicidal Thoughts:Suicidal Thoughts: Yes, Passive SI Passive Intent and/or Plan: Without Plan  Homicidal Thoughts:Homicidal Thoughts: Yes, Passive (would not indicate who he's had thoughts of harming) HI Passive Intent and/or Plan: Without Plan   Sensorium  Memory: Immediate Good; Recent Good; Remote Good  Judgment: Fair  Insight: Good (Understands and desires mental health treatment and stablization)   Executive Functions  Concentration: Good  Attention Span: Good  Recall: Good  Fund of  Knowledge: Good  Language: Good   Psychomotor Activity  Psychomotor Activity: Psychomotor Activity: Normal   Assets  Assets: Communication Skills; Desire for Improvement; Financial Resources/Insurance; Housing; Resilience    Sleep  Sleep: Sleep: Fair Number of Hours of Sleep: 0 (averages 4 hours  consistent "frequent wake and sleep cycle pattern of sleeping")   Physical Exam: Physical Exam Vitals reviewed.  Constitutional:      Appearance: Normal appearance.  HENT:     Head: Normocephalic and atraumatic.     Nose: Nose normal.  Eyes:     Extraocular Movements: Extraocular movements intact.     Pupils: Pupils are equal, round, and reactive to light.  Cardiovascular:     Rate and Rhythm: Normal rate and regular rhythm.  Pulmonary:     Effort: Pulmonary effort is normal.     Breath sounds: Normal breath sounds.  Skin:    General: Skin is warm.  Neurological:     General: No focal deficit present.     Mental Status: He is alert.    Review of Systems  Psychiatric/Behavioral:  Positive for depression, hallucinations, substance abuse and suicidal ideas. The patient has insomnia.     Blood pressure (!) 151/97, pulse 76, temperature 98.4 F (36.9 C), temperature source Oral, resp. rate 18, SpO2 98 %. There is no height or weight on file to calculate BMI.  Medical Decision Making: Patient case review and discussed with Dr. Viviano Simas, patient continues to meet inpatient psychiatric treatment criteria. Patient is unable to contract for safety at this time.  Current disposition patient will transfer to [facility] inpatient psychiatric facility on 11/05/2022.   Problem 1: Suicidal Ideations, continue with safety precautions and 1:1 sitter. Patient meets criteria for inpatient psychiatric treatment.  Problem 2: Schizophrenia, continue home meds Risperdal 1 mg twice daily, trazodone for insomnia 50 mg at bedtime.  Disposition: Patient will transfer tomorrow, 11/05/2022 to  [facility] and continues to meet inpatient psychiatric treatment criteria.  Joaquin Courts, FNP-C, PMHNP-BC  11/04/2022 9:04 AM

## 2022-11-05 NOTE — ED Provider Notes (Signed)
Emergency Medicine Observation Re-evaluation Note  Damon [name] is a 57 y.o. male, seen on rounds today.  Pt initially presented to the ED for complaints of RSV and Suicidal Currently, the patient is calm, cooperative. Plan for transfer to [facility] this AM. No cough, fever or sob.   Physical Exam  BP (!) 145/110 (BP Location: Right Arm)   Pulse 86   Temp 97.8 F (36.6 C) (Oral)   Resp 18   SpO2 99%  Physical Exam General: alert, content.  Cardiac: regular rate. Lungs: breathing comfortably. Psych: calm. Normal mood/affect.  ED Course / MDM    I have reviewed the labs performed to date as well as medications administered while in observation.  Recent changes in the last 24 hours include ED obs, med management, reassessment.   Plan  Current plan is  that pt has been accepted at [facility], Dr Loyola Mast.  Pt currently appears stable for transfer/transport.     Cathren Laine, MD 11/05/22 [phone]

## 2022-11-05 NOTE — ED Notes (Signed)
Pt belongings given to transport. Three bags and hat

## 2022-11-05 NOTE — Discharge Instructions (Addendum)
Transfer to [facility] 

## 2022-11-05 NOTE — ED Notes (Signed)
RN paged Nurse report [phone] to initiate pt transfer

## 2022-11-12 ENCOUNTER — Other Ambulatory Visit (HOSPITAL_COMMUNITY): Payer: Self-pay

## 2022-11-12 MED ORDER — RISPERIDONE 3 MG PO TABS: 3.0000 mg | ORAL_TABLET | Freq: Every day | ORAL | 1 refills | Status: DC

## 2022-11-12 MED ORDER — TRAZODONE HCL 50 MG PO TABS: 50.0000 mg | ORAL_TABLET | Freq: Every day | ORAL | 1 refills | Status: DC

## 2023-01-13 ENCOUNTER — Encounter (HOSPITAL_COMMUNITY): Payer: Self-pay

## 2023-01-13 ENCOUNTER — Ambulatory Visit (INDEPENDENT_AMBULATORY_CARE_PROVIDER_SITE_OTHER): Payer: 59 | Admitting: Mental Health

## 2023-01-13 DIAGNOSIS — F251 Schizoaffective disorder, depressive type: Secondary | ICD-10-CM
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
## 2023-01-18 ENCOUNTER — Ambulatory Visit (HOSPITAL_COMMUNITY): Payer: 59 | Admitting: Psychiatry

## 2023-02-14 ENCOUNTER — Telehealth (HOSPITAL_COMMUNITY): Payer: Self-pay | Admitting: Mental Health

## 2023-02-14 NOTE — Telephone Encounter (Signed)
Called patient on 4/1 at 925 AM letting them know that their appointment for tomorrow was cancled and needed to be rescheduled.

## 2023-02-15 ENCOUNTER — Ambulatory Visit (HOSPITAL_COMMUNITY): Payer: 59 | Admitting: Mental Health

## 2023-03-21 ENCOUNTER — Ambulatory Visit (HOSPITAL_COMMUNITY): Payer: 59 | Admitting: Mental Health

## 2023-03-21 ENCOUNTER — Telehealth (HOSPITAL_COMMUNITY): Payer: Self-pay | Admitting: Mental Health

## 2023-03-21 ENCOUNTER — Encounter (HOSPITAL_COMMUNITY): Payer: Self-pay

## 2023-03-21 NOTE — Telephone Encounter (Signed)
Sent link for tele-therapy session. No response after x 10 minutes. Contacted number on file; no response. Left HIPAA complaint message. NS

## 2023-05-23 ENCOUNTER — Ambulatory Visit (HOSPITAL_COMMUNITY): Payer: 59 | Admitting: Student

## 2023-06-12 ENCOUNTER — Encounter (HOSPITAL_COMMUNITY): Payer: Self-pay

## 2023-06-12 ENCOUNTER — Other Ambulatory Visit: Payer: Self-pay

## 2023-06-12 ENCOUNTER — Emergency Department (HOSPITAL_COMMUNITY): Payer: 59

## 2023-06-12 ENCOUNTER — Emergency Department (HOSPITAL_COMMUNITY)
Admission: EM | Admit: 2023-06-12 | Discharge: 2023-06-13 | Disposition: A | Discharge status: Other Acute Inpatient Hospital | Payer: 59 | Source: Home / Self Care | Attending: Emergency Medicine | Admitting: Emergency Medicine

## 2023-06-12 DIAGNOSIS — T1491XA Suicide attempt, initial encounter: Secondary | ICD-10-CM

## 2023-06-12 DIAGNOSIS — R440 Auditory hallucinations: Secondary | ICD-10-CM | POA: Diagnosis present

## 2023-06-12 DIAGNOSIS — F200 Paranoid schizophrenia: Secondary | ICD-10-CM | POA: Diagnosis not present

## 2023-06-12 DIAGNOSIS — R059 Cough, unspecified: Secondary | ICD-10-CM | POA: Diagnosis not present

## 2023-06-12 DIAGNOSIS — R441 Visual hallucinations: Secondary | ICD-10-CM

## 2023-06-12 DIAGNOSIS — I10 Essential (primary) hypertension: Secondary | ICD-10-CM | POA: Diagnosis not present

## 2023-06-12 DIAGNOSIS — R45851 Suicidal ideations: Secondary | ICD-10-CM | POA: Diagnosis not present

## 2023-06-12 LAB — URINALYSIS, ROUTINE W REFLEX MICROSCOPIC
Bilirubin Urine: NEGATIVE
Glucose, UA: NEGATIVE mg/dL
Hgb urine dipstick: NEGATIVE
Ketones, ur: NEGATIVE mg/dL
Leukocytes,Ua: NEGATIVE
Nitrite: NEGATIVE
Protein, ur: NEGATIVE mg/dL
Specific Gravity, Urine: 1.02 (ref 1.005–1.030)
pH: 6 (ref 5.0–8.0)

## 2023-06-12 LAB — COMPREHENSIVE METABOLIC PANEL
ALT: 20 U/L (ref 0–44)
AST: 22 U/L (ref 15–41)
Albumin: 4.1 g/dL (ref 3.5–5.0)
Alkaline Phosphatase: 42 U/L (ref 38–126)
Anion gap: 10 (ref 5–15)
BUN: 10 mg/dL (ref 6–20)
CO2: 23 mmol/L (ref 22–32)
Calcium: 9.1 mg/dL (ref 8.9–10.3)
Chloride: 106 mmol/L (ref 98–111)
Creatinine, Ser: 1.06 mg/dL (ref 0.61–1.24)
GFR, Estimated: >60 mL/min (ref >60)
Glucose, Bld: 93 mg/dL (ref 70–99)
Potassium: 3.6 mmol/L (ref 3.5–5.1)
Sodium: 139 mmol/L (ref 135–145)
Total Bilirubin: 0.9 mg/dL (ref 0.3–1.2)
Total Protein: 7.4 g/dL (ref 6.5–8.1)

## 2023-06-12 LAB — CBC
HCT: 40 % (ref 39.0–52.0)
Hemoglobin: 13.2 g/dL (ref 13.0–17.0)
MCH: 30.3 pg (ref 26.0–34.0)
MCHC: 33 g/dL (ref 30.0–36.0)
MCV: 92 fL (ref 80.0–100.0)
Platelets: 328 10*3/uL (ref 150–400)
RBC: 4.35 MIL/uL (ref 4.22–5.81)
RDW: 12.4 % (ref 11.5–15.5)
WBC: 5 10*3/uL (ref 4.0–10.5)
nRBC: 0 % (ref 0.0–0.2)

## 2023-06-12 LAB — RAPID URINE DRUG SCREEN, HOSP PERFORMED
Amphetamines: NOT DETECTED
Barbiturates: NOT DETECTED
Benzodiazepines: NOT DETECTED
Cocaine: NOT DETECTED
Opiates: NOT DETECTED
Tetrahydrocannabinol: NOT DETECTED

## 2023-06-12 LAB — ACETAMINOPHEN LEVEL: Acetaminophen (Tylenol), Serum: <10 ug/mL — ABNORMAL LOW (ref 10–30)

## 2023-06-12 LAB — SALICYLATE LEVEL: Salicylate Lvl: <7 mg/dL — ABNORMAL LOW (ref 7.0–30.0)

## 2023-06-12 LAB — ETHANOL: Alcohol, Ethyl (B): <10 mg/dL (ref <10)

## 2023-06-12 MED ORDER — ACETAMINOPHEN 325 MG PO TABS
650.0000 mg | ORAL_TABLET | Freq: Four times a day (QID) | ORAL | Status: DC | PRN
  Administered 2023-06-12: 650 mg via ORAL
  Filled 2023-06-12: qty 2

## 2023-06-12 MED ORDER — NICOTINE 14 MG/24HR TD PT24
14.0000 mg | MEDICATED_PATCH | Freq: Every day | TRANSDERMAL | Status: DC
  Administered 2023-06-12 – 2023-06-13 (×2): 14 mg via TRANSDERMAL
  Filled 2023-06-12 (×2): qty 1

## 2023-06-12 MED ORDER — HALOPERIDOL LACTATE 5 MG/ML IJ SOLN: 5.0000 mg | Freq: Four times a day (QID) | INTRAMUSCULAR | Status: DC | PRN

## 2023-06-12 MED ORDER — LIDOCAINE 5 % EX PTCH
1.0000 | MEDICATED_PATCH | CUTANEOUS | Status: DC
  Administered 2023-06-12: 1 via TRANSDERMAL
  Filled 2023-06-12: qty 1

## 2023-06-12 MED ORDER — RISPERIDONE 1 MG PO TABS
1.0000 mg | ORAL_TABLET | Freq: Once | ORAL | Status: AC
  Administered 2023-06-12: 1 mg via ORAL
  Filled 2023-06-12: qty 1

## 2023-06-12 MED ORDER — DIPHENHYDRAMINE HCL 25 MG PO CAPS: 50.0000 mg | ORAL_CAPSULE | Freq: Four times a day (QID) | ORAL | Status: DC | PRN

## 2023-06-12 MED ORDER — LORAZEPAM 1 MG PO TABS: 2.0000 mg | ORAL_TABLET | Freq: Four times a day (QID) | ORAL | Status: DC | PRN

## 2023-06-12 MED ORDER — BENAZEPRIL HCL 5 MG PO TABS: 5.0000 mg | ORAL_TABLET | Freq: Every day | ORAL | Status: DC

## 2023-06-12 MED ORDER — BENAZEPRIL HCL 5 MG PO TABS
5.0000 mg | ORAL_TABLET | Freq: Every day | ORAL | Status: DC
  Administered 2023-06-12 – 2023-06-13 (×2): 5 mg via ORAL
  Filled 2023-06-12 (×2): qty 1

## 2023-06-12 MED ORDER — HALOPERIDOL 5 MG PO TABS: 5.0000 mg | ORAL_TABLET | Freq: Four times a day (QID) | ORAL | Status: DC | PRN

## 2023-06-12 MED ORDER — RISPERIDONE 1 MG PO TABS
2.0000 mg | ORAL_TABLET | Freq: Every day | ORAL | Status: DC
  Administered 2023-06-12: 2 mg via ORAL
  Filled 2023-06-12: qty 1

## 2023-06-12 MED ORDER — LORAZEPAM 2 MG/ML IJ SOLN: 2.0000 mg | Freq: Four times a day (QID) | INTRAMUSCULAR | Status: DC | PRN

## 2023-06-12 MED ORDER — TRAZODONE HCL 50 MG PO TABS
50.0000 mg | ORAL_TABLET | Freq: Every day | ORAL | Status: DC
  Administered 2023-06-12: 50 mg via ORAL
  Filled 2023-06-12: qty 1

## 2023-06-12 MED ORDER — DIPHENHYDRAMINE HCL 50 MG/ML IJ SOLN: 50.0000 mg | Freq: Four times a day (QID) | INTRAMUSCULAR | Status: DC | PRN

## 2023-06-12 NOTE — ED Triage Notes (Signed)
Pt arrived POV from home c/o hearing voices that tell him to hurt himself, not being able to sleep, and trouble concentrating.

## 2023-06-12 NOTE — Progress Notes (Signed)
Pt meets inpatient behavioral health placement per Haywood Filler. Pt is under review by [facility] AC's Lona Kettle and Zachary George for placement within the [employer] system.   Maryjean Ka, MSW, [facility] 06/12/2023 6:52 PM
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
## 2023-06-12 NOTE — ED Notes (Signed)
Patient ambulated to [facility] with NAD; pt continues to endorse SI and AH. Patient asked if he would like for wife/girlfriend to be updated on status; pt states he wants to be left alone for the night; Pt offered to call family and states he will just call tomorrow; pt also updated on dispo for inpatient treatment and is agreeable at this time-Monique,RN

## 2023-06-12 NOTE — ED Notes (Signed)
Handoff report given to next shift RN.

## 2023-06-12 NOTE — ED Provider Triage Note (Signed)
Emergency Medicine Provider Triage Evaluation Note  Damon [name] , a 57 y.o. male  was evaluated in triage.  Pt complains of auditory hallucinations and homicidal ideation.  Has a history of paranoid schizophrenia.  Patient states that he lost track of his medication and has had worsening difficulty concentrating, loud voices.  He states it he wanted to get back on track with his medicine but did not want to overtake it and was not sure what to do.  He states he is feeling extremely frustrated and distracted by the voices that are yelling at him..  Review of Systems  Positive: Hallucinations and homicidal ideation  Negative: Fever  Physical Exam  BP (!) 165/107 (BP Location: Right Arm)   Pulse 91   Temp 97.8 F (36.6 C) (Oral)   Resp 16   Ht 5\' 11"  (1.803 m)   Wt 81.6 kg   SpO2 99%   BMI 25.10 kg/m  Gen:   Awake, no distress   Resp:  Normal effort  MSK:   Moves extremities without difficulty  Other:  Kind, cooperative, coherent  Medical Decision Making  Medically screening exam initiated at 11:22 AM.  Appropriate orders placed.  [name] was informed that the remainder of the evaluation will be completed by another provider, this initial triage assessment does not replace that evaluation, and the importance of remaining in the ED until their evaluation is complete.     Arthor Captain, PA-C 06/12/23 1123

## 2023-06-12 NOTE — Progress Notes (Signed)
LCSW Progress Note  [id number]   Damon [name]  06/12/2023  10:25 PM    Inpatient Behavioral Health Placement  Pt meets inpatient criteria per [name]. There are no available beds within [facility] system per [facility] Erica Wright,RN. Referral was sent to the following facilities;   Destination  Service Provider Address Phone [facility]  [address] [place], [place] [address] [phone]  [facility]  [address] [place] [address] [phone]  [facility]  [address] [place] [address] [phone]  [facility] [address]  [address] [place] [address] [phone]  [facility]  [address] [place], [place] [address] [phone]  [facility] [place] [address] [phone]  [facility]  [address], [place] [address] [phone]  [facility]  [address] [place], [place] [address] [phone]  [facility]  [address] [place] [address] [phone]  [facility]  [address], [place] [address] [phone]  [facility]  [address] [place] [address] [phone]  [facility]  [address], [place] [address] [phone]  [facility]  [address] [place] [address] [phone]  [facility]  [address] [place] [address] [phone]  [facility]  [address] [place], [place] [address] [phone]  [facility]  [address] [place] [address]  [phone]  [facility]  [address] [place] [address] [phone]     Situation ongoing,  CSW will follow up.    Maryjean Ka, MSW, [facility] 06/12/2023 10:25 PM

## 2023-06-12 NOTE — ED Notes (Addendum)
Pt's wife, Mich Amedee [phone], would like an update as soon as possible.  Ms. Landsberger has the pt's phone.
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
## 2023-06-12 NOTE — ED Notes (Signed)
Girlfriend Grover Canavan [phone] would like an update asap

## 2023-06-13 DIAGNOSIS — Z62811 Personal history of psychological abuse in childhood: Secondary | ICD-10-CM | POA: Diagnosis not present

## 2023-06-13 DIAGNOSIS — R45851 Suicidal ideations: Secondary | ICD-10-CM | POA: Diagnosis not present

## 2023-06-13 DIAGNOSIS — Z62810 Personal history of physical and sexual abuse in childhood: Secondary | ICD-10-CM | POA: Diagnosis not present

## 2023-06-13 DIAGNOSIS — Z9151 Personal history of suicidal behavior: Secondary | ICD-10-CM | POA: Diagnosis not present

## 2023-06-13 DIAGNOSIS — Z560 Unemployment, unspecified: Secondary | ICD-10-CM | POA: Diagnosis not present

## 2023-06-13 DIAGNOSIS — F17220 Nicotine dependence, chewing tobacco, uncomplicated: Secondary | ICD-10-CM | POA: Diagnosis not present

## 2023-06-13 DIAGNOSIS — F200 Paranoid schizophrenia: Secondary | ICD-10-CM | POA: Diagnosis not present

## 2023-06-13 DIAGNOSIS — F251 Schizoaffective disorder, depressive type: Secondary | ICD-10-CM | POA: Diagnosis not present

## 2023-06-13 DIAGNOSIS — I10 Essential (primary) hypertension: Secondary | ICD-10-CM | POA: Diagnosis not present

## 2023-06-13 NOTE — Progress Notes (Signed)
Pt was accepted to [facility] TODAY 06/13/2023. Bed assignment: Main campus  Pt meets inpatient criteria per Eligha Bridegroom, NP  Attending Physician will be Loni Beckwith, MD  Report can be called to: [phone] (this is a pager, please leave call-back number when giving report)  Pt can arrive after 8 AM  Care Team Notified: Eligha Bridegroom, NP and Denton Ar, RN  Cathie Beams, LCSW  06/13/2023 10:31 AM

## 2023-06-13 NOTE — ED Provider Notes (Signed)
Emergency Medicine Observation Re-evaluation Note  Damon [name] is a 57 y.o. male, seen on rounds today.  Pt initially presented to the ED for complaints of Psychiatric Evaluation Currently, the patient is calm.  Patient came to the ER yesterday with the psychosis.  He was seen by psychiatry service, they have added Risperdal 2 mg nightly.  He is to be admitted for psychiatric stabilization  Physical Exam  BP (!) 158/98 (BP Location: Right Arm)   Pulse 88   Temp 98.5 F (36.9 C) (Oral)   Resp 16   Ht 5\' 11"  (1.803 m)   Wt 81.6 kg   SpO2 97%   BMI 25.10 kg/m  Physical Exam General: Currently calm Cardiac: Regular rate Lungs: No respiratory distress Psych: Calm  ED Course / MDM  EKG:EKG Interpretation Date/Time:  Sunday June 12 2023 16:36:37 EDT Ventricular Rate:  89 PR Interval:  162 QRS Duration:  80 QT Interval:  352 QTC Calculation: 428 R Axis:   67  Text Interpretation: Normal sinus rhythm Minimal voltage criteria for LVH, may be normal variant ( Sokolow-Lyon ) Nonspecific T wave abnormality Abnormal ECG When compared with ECG of 31-Oct-2022 10:03, No significant change was found Confirmed by Dione Booze ([id number]) on 06/12/2023 11:09:43 PM  I have reviewed the labs performed to date as well as medications administered while in observation.  Recent changes in the last 24 hours include Risperdal 2 mg nightly started.  Patient has been accepted to [facility].  I will complete EMTALA.  Plan  Current plan is for transfer to [facility].Derwood Kaplan, MD 06/13/23 [phone]

## 2023-06-13 NOTE — ED Notes (Signed)
Pt has been accepted by [facility] for today after 0800. Per [facility] staff.  Attending: Loni Beckwith MD Pager number [phone] Notified BH team

## 2023-06-13 NOTE — ED Notes (Signed)
Called [facility] x 2 then called pager line for report.

## 2023-06-20 ENCOUNTER — Other Ambulatory Visit (HOSPITAL_COMMUNITY): Payer: Self-pay

## 2023-06-20 DIAGNOSIS — Z560 Unemployment, unspecified: Secondary | ICD-10-CM | POA: Diagnosis not present

## 2023-06-20 DIAGNOSIS — F17220 Nicotine dependence, chewing tobacco, uncomplicated: Secondary | ICD-10-CM | POA: Diagnosis not present

## 2023-06-20 DIAGNOSIS — F251 Schizoaffective disorder, depressive type: Secondary | ICD-10-CM | POA: Diagnosis not present

## 2023-06-20 DIAGNOSIS — Z62810 Personal history of physical and sexual abuse in childhood: Secondary | ICD-10-CM | POA: Diagnosis not present

## 2023-06-20 DIAGNOSIS — Z9151 Personal history of suicidal behavior: Secondary | ICD-10-CM | POA: Diagnosis not present

## 2023-06-20 DIAGNOSIS — Z62811 Personal history of psychological abuse in childhood: Secondary | ICD-10-CM | POA: Diagnosis not present

## 2023-06-20 DIAGNOSIS — F200 Paranoid schizophrenia: Secondary | ICD-10-CM | POA: Diagnosis not present

## 2023-06-20 DIAGNOSIS — R45851 Suicidal ideations: Secondary | ICD-10-CM | POA: Diagnosis not present

## 2023-06-20 DIAGNOSIS — I10 Essential (primary) hypertension: Secondary | ICD-10-CM | POA: Diagnosis not present

## 2023-09-10 ENCOUNTER — Emergency Department (HOSPITAL_COMMUNITY): Payer: 59

## 2023-09-10 ENCOUNTER — Encounter (HOSPITAL_COMMUNITY): Payer: Self-pay | Admitting: Emergency Medicine

## 2023-09-10 ENCOUNTER — Inpatient Hospital Stay
Admission: AD | Admit: 2023-09-10 | Discharge: 2023-09-17 | DRG: 885 | Disposition: A | Discharge status: Home / Self Care | Payer: 59 | Source: Intra-hospital | Attending: Psychiatry | Admitting: Psychiatry

## 2023-09-10 ENCOUNTER — Emergency Department (HOSPITAL_COMMUNITY)
Admission: EM | Admit: 2023-09-10 | Discharge: 2023-09-10 | Disposition: A | Discharge status: Home / Self Care | Payer: 59 | Attending: Emergency Medicine | Admitting: Emergency Medicine

## 2023-09-10 ENCOUNTER — Ambulatory Visit (INDEPENDENT_AMBULATORY_CARE_PROVIDER_SITE_OTHER)
Admission: EM | Admit: 2023-09-10 | Discharge: 2023-09-10 | Disposition: A | Discharge status: Other Acute Inpatient Hospital | Payer: 59 | Source: Home / Self Care

## 2023-09-10 ENCOUNTER — Other Ambulatory Visit: Payer: Self-pay

## 2023-09-10 DIAGNOSIS — Z888 Allergy status to other drugs, medicaments and biological substances status: Secondary | ICD-10-CM

## 2023-09-10 DIAGNOSIS — Z9109 Other allergy status, other than to drugs and biological substances: Secondary | ICD-10-CM | POA: Diagnosis not present

## 2023-09-10 DIAGNOSIS — I10 Essential (primary) hypertension: Secondary | ICD-10-CM | POA: Diagnosis present

## 2023-09-10 DIAGNOSIS — Z833 Family history of diabetes mellitus: Secondary | ICD-10-CM

## 2023-09-10 DIAGNOSIS — R45851 Suicidal ideations: Secondary | ICD-10-CM | POA: Insufficient documentation

## 2023-09-10 DIAGNOSIS — I1A0 Resistant hypertension: Secondary | ICD-10-CM | POA: Diagnosis not present

## 2023-09-10 DIAGNOSIS — Z83438 Family history of other disorder of lipoprotein metabolism and other lipidemia: Secondary | ICD-10-CM

## 2023-09-10 DIAGNOSIS — F209 Schizophrenia, unspecified: Secondary | ICD-10-CM | POA: Diagnosis present

## 2023-09-10 DIAGNOSIS — Z9151 Personal history of suicidal behavior: Secondary | ICD-10-CM

## 2023-09-10 DIAGNOSIS — Z79899 Other long term (current) drug therapy: Secondary | ICD-10-CM | POA: Diagnosis not present

## 2023-09-10 DIAGNOSIS — Z818 Family history of other mental and behavioral disorders: Secondary | ICD-10-CM | POA: Diagnosis not present

## 2023-09-10 DIAGNOSIS — F410 Panic disorder [episodic paroxysmal anxiety] without agoraphobia: Secondary | ICD-10-CM | POA: Diagnosis present

## 2023-09-10 DIAGNOSIS — F4310 Post-traumatic stress disorder, unspecified: Secondary | ICD-10-CM | POA: Diagnosis present

## 2023-09-10 DIAGNOSIS — E781 Pure hyperglyceridemia: Secondary | ICD-10-CM | POA: Diagnosis present

## 2023-09-10 DIAGNOSIS — F200 Paranoid schizophrenia: Secondary | ICD-10-CM | POA: Insufficient documentation

## 2023-09-10 DIAGNOSIS — G4700 Insomnia, unspecified: Secondary | ICD-10-CM | POA: Diagnosis present

## 2023-09-10 DIAGNOSIS — R440 Auditory hallucinations: Secondary | ICD-10-CM | POA: Diagnosis present

## 2023-09-10 DIAGNOSIS — Z823 Family history of stroke: Secondary | ICD-10-CM | POA: Diagnosis not present

## 2023-09-10 DIAGNOSIS — Z800 Family history of malignant neoplasm of digestive organs: Secondary | ICD-10-CM | POA: Diagnosis not present

## 2023-09-10 DIAGNOSIS — Z8249 Family history of ischemic heart disease and other diseases of the circulatory system: Secondary | ICD-10-CM

## 2023-09-10 DIAGNOSIS — F32A Depression, unspecified: Secondary | ICD-10-CM | POA: Diagnosis present

## 2023-09-10 LAB — COMPREHENSIVE METABOLIC PANEL
ALT: 21 U/L (ref 0–44)
AST: 23 U/L (ref 15–41)
Albumin: 4.1 g/dL (ref 3.5–5.0)
Alkaline Phosphatase: 40 U/L (ref 38–126)
Anion gap: 5 (ref 5–15)
BUN: 10 mg/dL (ref 6–20)
CO2: 25 mmol/L (ref 22–32)
Calcium: 9.1 mg/dL (ref 8.9–10.3)
Chloride: 106 mmol/L (ref 98–111)
Creatinine, Ser: 1.25 mg/dL — ABNORMAL HIGH (ref 0.61–1.24)
GFR, Estimated: >60 mL/min (ref >60)
Glucose, Bld: 101 mg/dL — ABNORMAL HIGH (ref 70–99)
Potassium: 4 mmol/L (ref 3.5–5.1)
Sodium: 136 mmol/L (ref 135–145)
Total Bilirubin: 0.8 mg/dL (ref 0.3–1.2)
Total Protein: 7.4 g/dL (ref 6.5–8.1)

## 2023-09-10 LAB — CBC
HCT: 41.3 % (ref 39.0–52.0)
Hemoglobin: 13.7 g/dL (ref 13.0–17.0)
MCH: 30.2 pg (ref 26.0–34.0)
MCHC: 33.2 g/dL (ref 30.0–36.0)
MCV: 91 fL (ref 80.0–100.0)
Platelets: 335 10*3/uL (ref 150–400)
RBC: 4.54 MIL/uL (ref 4.22–5.81)
RDW: 12.8 % (ref 11.5–15.5)
WBC: 5.5 10*3/uL (ref 4.0–10.5)
nRBC: 0 % (ref 0.0–0.2)

## 2023-09-10 LAB — RAPID URINE DRUG SCREEN, HOSP PERFORMED
Amphetamines: NOT DETECTED
Barbiturates: NOT DETECTED
Benzodiazepines: NOT DETECTED
Cocaine: NOT DETECTED
Opiates: NOT DETECTED
Tetrahydrocannabinol: NOT DETECTED

## 2023-09-10 LAB — SALICYLATE LEVEL: Salicylate Lvl: <7 mg/dL — ABNORMAL LOW (ref 7.0–30.0)

## 2023-09-10 LAB — ACETAMINOPHEN LEVEL: Acetaminophen (Tylenol), Serum: <10 ug/mL — ABNORMAL LOW (ref 10–30)

## 2023-09-10 LAB — ETHANOL: Alcohol, Ethyl (B): <10 mg/dL (ref <10)

## 2023-09-10 MED ORDER — BENAZEPRIL HCL 10 MG PO TABS: 10.0000 mg | ORAL_TABLET | Freq: Every day | ORAL | 2 refills | Status: DC

## 2023-09-10 MED ORDER — HYDRALAZINE HCL 25 MG PO TABS
25.0000 mg | ORAL_TABLET | Freq: Three times a day (TID) | ORAL | 0 refills | Status: DC | PRN
Start: 2023-09-10 — End: 2024-01-31

## 2023-09-10 MED ORDER — TRAZODONE HCL 50 MG PO TABS: 50.0000 mg | ORAL_TABLET | Freq: Every evening | ORAL | Status: DC | PRN

## 2023-09-10 MED ORDER — ACETAMINOPHEN 325 MG PO TABS: 650.0000 mg | ORAL_TABLET | Freq: Four times a day (QID) | ORAL | Status: DC | PRN

## 2023-09-10 MED ORDER — NICOTINE 21 MG/24HR TD PT24: 21.0000 mg | MEDICATED_PATCH | Freq: Every day | TRANSDERMAL | Status: DC

## 2023-09-10 MED ORDER — BENAZEPRIL HCL 20 MG PO TABS: 10.0000 mg | ORAL_TABLET | Freq: Every day | ORAL | Status: DC

## 2023-09-10 MED ORDER — HYDRALAZINE HCL 50 MG PO TABS
50.0000 mg | ORAL_TABLET | Freq: Once | ORAL | Status: AC
  Administered 2023-09-10: 50 mg via ORAL
  Filled 2023-09-10: qty 1

## 2023-09-10 MED ORDER — BENAZEPRIL HCL 5 MG PO TABS
10.0000 mg | ORAL_TABLET | Freq: Every day | ORAL | Status: DC
  Administered 2023-09-10: 10 mg via ORAL
  Filled 2023-09-10: qty 2

## 2023-09-10 MED ORDER — RISPERIDONE 3 MG PO TABS
3.0000 mg | ORAL_TABLET | Freq: Two times a day (BID) | ORAL | 2 refills | Status: DC
Start: 2023-09-10 — End: 2024-01-31

## 2023-09-10 MED ORDER — ACETAMINOPHEN 325 MG PO TABS
650.0000 mg | ORAL_TABLET | Freq: Once | ORAL | Status: AC
  Administered 2023-09-10: 650 mg via ORAL
  Filled 2023-09-10: qty 2

## 2023-09-10 MED ORDER — ALUM & MAG HYDROXIDE-SIMETH 200-200-20 MG/5ML PO SUSP: 30.0000 mL | ORAL | Status: DC | PRN

## 2023-09-10 MED ORDER — HYDROXYZINE HCL 25 MG PO TABS: 25.0000 mg | ORAL_TABLET | Freq: Three times a day (TID) | ORAL | Status: DC | PRN

## 2023-09-10 MED ORDER — RISPERIDONE 3 MG PO TABS
3.0000 mg | ORAL_TABLET | Freq: Two times a day (BID) | ORAL | Status: DC
  Administered 2023-09-10: 3 mg via ORAL
  Filled 2023-09-10: qty 1

## 2023-09-10 MED ORDER — MAGNESIUM HYDROXIDE 400 MG/5ML PO SUSP: 30.0000 mL | Freq: Every day | ORAL | Status: DC | PRN

## 2023-09-10 MED ORDER — HYDRALAZINE HCL 25 MG PO TABS: 25.0000 mg | ORAL_TABLET | Freq: Three times a day (TID) | ORAL | Status: DC | PRN

## 2023-09-10 NOTE — Discharge Instructions (Signed)
Transfer to [facility] for IP admission, Dr. Marlou Porch is accepting MD

## 2023-09-10 NOTE — Discharge Instructions (Addendum)
increase Risperdal to 3 mg p.o. twice daily and follow-up with walk-in services at [facility] and/or [address] behavioral health for therapy psychiatry services.   Increase the dose of your blood pressure medication to the 1 that I prescribed, if you check your blood pressure and it is greater than than 180 on the top number for greater than 110 on the bottom number you can take the additional medication that I prescribed up to 3 times daily.  I recommend that you follow-up with your doctor to discuss long-term changes to your blood pressure medication.

## 2023-09-10 NOTE — ED Notes (Addendum)
Pt is admitted to obs. Reports passive SI/ denies HI/VH endorses ah. Calm, cooperative throughout interview process. Skin assessment completed. Oriented to unit. Meal and drink offered.  Pt verbally contract for safety. Will monitor for safety.

## 2023-09-10 NOTE — ED Triage Notes (Signed)
Pt c.o increased depression, states he has hx of paranoid schizophrenia. Pt has been taking all his prescribed medications. Pt c.o AH and intermittent SI.  Pt also c.o HTN, reports compliance with BP medications.

## 2023-09-10 NOTE — Consult Note (Signed)
[facility] Face-to-Face Psychiatry Consult   Reason for Consult: Auditory hallucinations Referring Physician: Emergency room provider.  Damon Nearing, MD Patient Identification: Damon [name] MRN:  [id number] Principal Diagnosis: Auditory hallucinations Diagnosis:  Principal Problem:   Auditory hallucinations   Total Time spent with patient: 15 minutes  Subjective:   Damon [name] is a 57 y.o. male presented to [facility] emergency department due to frequent headaches which he reports is causing his auditory hallucinations to get worse.  He reports he has been hearing voices since he was younger.  Damon [name]  carries a diagnosis with paranoid schizophrenia, schizoaffective disorder and generalized anxiety disorder.  He reports he is currently followed by [facility] Dr. Kathlyn Sacramento.  States he is prescribed Risperdal and Cogentin.  States he has been taking and tolerating medications well.  States auditory hallucinations are worse with loud noises.  States he has been under a lot of stress lately did not elaborate on stressors.    Patient was seen and evaluated face-to-face by this provider.  Patient significant other Damon [name] )  is at bedside.  He provided verbal authorization for her to stay during assessment.  Discussed inpatient admission due to auditory hallucinations however patient declined.  Discussed following up for medication adjustments.  Will increase Risperdal to 3 mg p.o. twice daily with follow-up with walk-in services at [facility] and/or [address] behavioral health for therapy psychiatry services.  Patient was amendable to plan.  He is denying suicidal or homicidal ideations.  Patient's significant other did not have any concerns related to patient discharging.   -Chart review UDS negative, EtOH negative patient was offered inpatient admission however he declined.   -Case staffed with attending psychiatrist Akintayo, Musa. MD  HPI:  Per assessment note: "Damon [name] is a 57 y.o. male with  past medical history significant for paranoid schizophrenia, poorly controlled hypertension, depression who presents with concern for worsening of his baseline schizophrenia.  Patient reports he has been taking his Risperdal, trazodone and his blood pressure medication as prescribed, but began hearing voices on Monday.  He cannot tell me what they are saying.  He reports that he has had some changes in mood, reports intermittent suicidal ideation, but denies any suicidal ideation at time of our interview.  He does not have any plans for killing himself.  He reports that he had a headache that began 2 days ago which he attributed to his poorly controlled blood pressure, he denies any numbness, tingling, vision changes, dizziness, weakness.  He denies any homicidal ideation."    Past Psychiatric History: Schizoaffective disorder, schizophrenia paranoia.  Major depressive disorder, generalized anxiety disorder.  Suicidal ideations.  Currently prescribed Risperdal and Cogentin.  Reports she is currently followed by [facility] services  Risk to Self:   Risk to Others:   Prior Inpatient Therapy:   Prior Outpatient Therapy:    Past Medical History:  Past Medical History:  Diagnosis Date   Depression    Hypertension    Paranoid schizophrenia (HCC)    Schizophrenia (HCC)    History reviewed. No pertinent surgical history. Family History:  Family History  Problem Relation Age of Onset   Colon cancer Mother    Hyperlipidemia Father    Heart failure Brother    Diabetes Other    Stroke Other    Family Psychiatric  History:  Social History:  Social History   Substance and Sexual Activity  Alcohol Use Yes   Alcohol/week: 1.0 standard drink of alcohol   Types:  1 Cans of beer per week   Comment: one can of beer weekly     Social History   Substance and Sexual Activity  Drug Use Not Currently   Types: Marijuana    Social History   Socioeconomic History   Marital status: Single    Spouse  name: Not on file   Number of children: Not on file   Years of education: Not on file   Highest education level: Not on file  Occupational History   Not on file  Tobacco Use   Smoking status: Never   Smokeless tobacco: Never  Substance and Sexual Activity   Alcohol use: Yes    Alcohol/week: 1.0 standard drink of alcohol    Types: 1 Cans of beer per week    Comment: one can of beer weekly   Drug use: Not Currently    Types: Marijuana   Sexual activity: Yes    Birth control/protection: Condom  Other Topics Concern   Not on file  Social History Narrative   ** Merged History Encounter **       Social Determinants of Health   Financial Resource Strain: Low Risk  (01/13/2023)   Overall Financial Resource Strain (CARDIA)    Difficulty of Paying Living Expenses: Not hard at all  Food Insecurity: Not on File (08/11/2023)   Received from [facility] In[facility]    Food: 0  Transportation Needs: Unmet Transportation Needs (01/13/2023)   PRAPARE - Transportation    Lack of Transportation (Medical): Yes    Lack of Transportation (Non-Medical): Yes  Physical Activity: Sufficiently Active (01/13/2023)   Exercise Vital Sign    Days of Exercise per Week: 5 days    Minutes of Exercise per Session: 60 min  Stress: Stress Concern Present (01/13/2023)   [employer] of Occupational Health - Occupational Stress Questionnaire    Feeling of Stress : To some extent  Social Connections: Not on File (07/30/2023)   Received from [facility]   Social Connections    Connectedness: 0   Additional Social History:    Allergies:   Allergies  Allergen Reactions   Spinach Hives   Zofran [Ondansetron] Rash    Labs:  Results for orders placed or performed during the hospital encounter of 09/10/23 (from the past 48 hour(s))  Comprehensive metabolic panel     Status: Abnormal   Collection Time: 09/10/23 10:32 AM  Result Value Ref Range   Sodium 136 135 - 145 mmol/L   Potassium 4.0 3.5 - 5.1 mmol/L    Chloride 106 98 - 111 mmol/L   CO2 25 22 - 32 mmol/L   Glucose, Bld 101 (H) 70 - 99 mg/dL    Comment: Glucose reference range applies only to samples taken after fasting for at least 8 hours.   BUN 10 6 - 20 mg/dL   Creatinine, Ser 1.61 (H) 0.61 - 1.24 mg/dL   Calcium 9.1 8.9 - 09.6 mg/dL   Total Protein 7.4 6.5 - 8.1 g/dL   Albumin 4.1 3.5 - 5.0 g/dL   AST 23 15 - 41 U/L   ALT 21 0 - 44 U/L   Alkaline Phosphatase 40 38 - 126 U/L   Total Bilirubin 0.8 0.3 - 1.2 mg/dL   GFR, Estimated >04 >54 mL/min    Comment: (NOTE) Calculated using the CKD-EPI Creatinine Equation (2021)    Anion gap 5 5 - 15    Comment: Performed at [facility], [address], [place], [place]  [address]  Ethanol     Status: None   Collection Time: 09/10/23 10:32 AM  Result Value Ref Range   Alcohol, Ethyl (B) <10 <10 mg/dL    Comment: (NOTE) Lowest detectable limit for serum alcohol is 10 mg/dL.  For medical purposes only. Performed at [facility], [address], [place], [place] [address]   Salicylate level     Status: Abnormal   Collection Time: 09/10/23 10:32 AM  Result Value Ref Range   Salicylate Lvl <7.0 (L) 7.0 - 30.0 mg/dL    Comment: Performed at [facility], [address] [place], [place] [address]  Acetaminophen level     Status: Abnormal   Collection Time: 09/10/23 10:32 AM  Result Value Ref Range   Acetaminophen (Tylenol), Serum <10 (L) 10 - 30 ug/mL    Comment: (NOTE) Therapeutic concentrations vary significantly. A range of 10-30 ug/mL  may be an effective concentration for many patients. However, some  are best treated at concentrations outside of this range. Acetaminophen concentrations >150 ug/mL at 4 hours after ingestion  and >50 ug/mL at 12 hours after ingestion are often associated with  toxic reactions.  Performed at [facility], [address], [place], [place] [address]   cbc     Status: None   Collection Time: 09/10/23 10:32 AM  Result  Value Ref Range   WBC 5.5 4.0 - 10.5 K/uL   RBC 4.54 4.22 - 5.81 MIL/uL   Hemoglobin 13.7 13.0 - 17.0 g/dL   HCT 44.0 34.7 - 42.5 %   MCV 91.0 80.0 - 100.0 fL   MCH 30.2 26.0 - 34.0 pg   MCHC 33.2 30.0 - 36.0 g/dL   RDW 95.6 38.7 - 56.4 %   Platelets 335 150 - 400 K/uL   nRBC 0.0 0.0 - 0.2 %    Comment: Performed at [facility] Lab, [address], [place], [place] [address]  Rapid urine drug screen (hospital performed)     Status: None   Collection Time: 09/10/23 10:32 AM  Result Value Ref Range   Opiates NONE DETECTED NONE DETECTED   Cocaine NONE DETECTED NONE DETECTED   Benzodiazepines NONE DETECTED NONE DETECTED   Amphetamines NONE DETECTED NONE DETECTED   Tetrahydrocannabinol NONE DETECTED NONE DETECTED   Barbiturates NONE DETECTED NONE DETECTED    Comment: (NOTE) DRUG SCREEN FOR MEDICAL PURPOSES ONLY.  IF CONFIRMATION IS NEEDED FOR ANY PURPOSE, NOTIFY LAB WITHIN 5 DAYS.  LOWEST DETECTABLE LIMITS FOR URINE DRUG SCREEN Drug Class                     Cutoff (ng/mL) Amphetamine and metabolites    1000 Barbiturate and metabolites    200 Benzodiazepine                 200 Opiates and metabolites        300 Cocaine and metabolites        300 THC                            50 Performed at [facility], [address] [place], [place] [address]     No current facility-administered medications for this encounter.   Current Outpatient Medications  Medication Sig Dispense Refill   acetaminophen (TYLENOL) 325 MG tablet Take 650 mg by mouth every 6 (six) hours as needed for mild pain or headache.  benazepril (LOTENSIN) 5 MG tablet Take 1 tablet (5 mg total) by mouth daily.     risperiDONE (RISPERDAL) 1 MG tablet Take 1 tablet (1 mg total) by mouth 2 (two) times daily.     risperiDONE (RISPERDAL) 3 MG tablet Take 1 tablet (3 mg total) by mouth at bedtime for psychosis. 15 tablet 1   traZODone (DESYREL) 50 MG tablet Take 1 tablet (50mg ) by mouth daily AT BEDTIME  for Insomnia 15 tablet 1    Musculoskeletal: Strength & Muscle Tone: within normal limits Gait & Station: normal Patient leans: N/A            Psychiatric Specialty Exam:  Presentation  General Appearance:  Appropriate for Environment  Eye Contact: Good  Speech: Clear and Coherent  Speech Volume: Normal  Handedness: Right   Mood and Affect  Mood: Anxious; Depressed  Affect: Congruent   Thought Process  Thought Processes: Coherent  Descriptions of Associations:Intact  Orientation:Full (Time, Place and Person)  Thought Content:Logical  History of Schizophrenia/Schizoaffective disorder:Yes  Duration of Psychotic Symptoms:Greater than six months  Hallucinations:Hallucinations: Auditory Description of Auditory Hallucinations: " i dont like loud noise"  Ideas of Reference:None  Suicidal Thoughts:Suicidal Thoughts: No  Homicidal Thoughts:Homicidal Thoughts: No   Sensorium  Memory: Immediate Fair; Remote Good  Judgment: Good  Insight: Good   Executive Functions  Concentration: Good  Attention Span: Good  Recall: Good  Fund of Knowledge: Good  Language: Good   Psychomotor Activity  Psychomotor Activity: Psychomotor Activity: Normal; Extrapyramidal Side Effects (EPS)   Assets  Assets: Desire for Improvement   Sleep  Sleep: Sleep: Fair   Physical Exam: Physical Exam Vitals and nursing note reviewed.  Psychiatric:        Mood and Affect: Mood normal.        Thought Content: Thought content normal.    Review of Systems  Psychiatric/Behavioral:  Positive for depression and hallucinations (Auditory hallucinations is getting louder). Negative for suicidal ideas. The patient is nervous/anxious.   All other systems reviewed and are negative.  Blood pressure (!) 185/121, pulse 96, temperature 98.3 F (36.8 C), resp. rate 16, height 5\' 11"  (1.803 m), weight 83 kg, SpO2 100%. Body mass index is 25.52  kg/m.  Treatment Plan Summary: Daily contact with patient to assess and evaluate symptoms and progress in treatment and Medication management -Medication adjustment to Risperdal, take 3 mg p.o. twice daily -Follow-up with [facility] and/or [facility] urgent care facility  Disposition: No evidence of imminent risk to self or others at present.   Patient does not meet criteria for psychiatric inpatient admission. Supportive therapy provided about ongoing stressors. Refer to IOP. Discussed crisis plan, support from social network, calling 911, coming to the Emergency Department, and calling Suicide Hotline.  Oneta Rack, NP 09/10/2023 1:01 PM

## 2023-09-10 NOTE — Progress Notes (Signed)
Pt was accepted to [facility]  09/10/2023; Bed Assignment 313  Address: [address] [place], [place], [place] [address]  Lenon Oms Number [phone]  Pt meets inpatient criteria per Julaine Fusi  Attending Physician will be Dr. Shellee Milo  Report can be called to: [phone]  Pt can arrive after: 2130  Care Team notified:Day CONE [facility] Lona Kettle, Night CONE [facility] [place], [place],RN, [facility], MSW, [facility] 09/10/2023 7:23 PM
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
## 2023-09-10 NOTE — Progress Notes (Signed)
   09/10/23 1701  [facility]UC Triage Screening (Walk-ins at [facility] only)  How Did You Hear About [place]? Self  What Is the Reason for Your Visit/Call Today? Damon [name] is a 57 year old male who presents to [facility] after being discharged from [facility]. Pt states he was seen for a psychiatric evaluation and for hypertension. Patient states he was treated and discharged, but would like to speak with a therapist. Patient reports his medications were adjusted at the hospital. Patient reports chronic auditory hallucinations but cannot understand what they are saying. Patient reports "the voices are just scrambled". Pt is not established with outpatient therapy. Pt states his medications were prescribed by a provider at [facility] but had financial issues and could no longer afford to pay for his medication. He reports he has been following up with the Emergency department for refills. Pt reports he is diagnosed with Paranoid Schizophrenia. Patient reports SI but denies any plans or intent. Patient denies HI and visual hallucinations. Pt denies drug or alcohol use.  How Long Has This Been Causing You Problems? <Week  Have You Recently Had Any Thoughts About Hurting Yourself? Yes  How long ago did you have thoughts about hurting yourself? today  Are You Planning to Commit Suicide/Harm Yourself At This time? No  Have you Recently Had Thoughts About Hurting Someone [name]? No  Are You Planning To Harm Someone At This Time? No  Are you currently experiencing any auditory, visual or other hallucinations? No  Have You Used Any Alcohol or Drugs in the Past 24 Hours? No  Do you have any current medical co-morbidities that require immediate attention? No  Clinician description of patient physical appearance/behavior: alert, cooperative, casually dressed  What Do You Feel Would Help You the Most Today? Treatment for Depression or other mood problem  If access to [facility] was not available, would you have sought care in the Emergency  Department? No  Determination of Need Routine (7 days)  Options For Referral Outpatient Therapy;Medication Management

## 2023-09-10 NOTE — ED Notes (Signed)
Report called to [facility] RN @ [facility].

## 2023-09-10 NOTE — ED Notes (Signed)
Belongings in locker #12 

## 2023-09-10 NOTE — ED Provider Notes (Cosign Needed Addendum)
[facility] Medical Screening Exam  Date: 09/10/23 Patient Name: Damon [name] MRN: [id number] Chief Complaint: Auditory hallucinations that are telling him to kill himself  Diagnoses:  Final diagnoses:  Schizophrenia, paranoid type (HCC)  Auditory hallucinations    HPI:  patient presented to [facility] as a walk in  unaccompanied with complaints of auditory hallucinations that are telling him to kill hisself.  Damon [name], 57 y.o., male patient seen face to face by this provider and chart reviewed on 09/10/23.  Per chart review patient has past psychiatric history of schizophrenia.  Patient reports he is prescribed Risperdal 3 mg twice daily.  He has no outpatient services for medication management or therapy in place.  Reports he has been receiving his medications by going to the emergency departments.  He in the past has had services in place with [facility].  He denies any substance use.  He is unemployed and receives disability.  He is married but lives in a separate home from his spouse.  Of note patient was seen and evaluated at [facility] ED today with similar presentation.  He was offered inpatient psychiatric admission but declined at the time.  When discussing this with patient he states that he felt that he was improved but once he left the hospital his auditory hallucinations began to intensify and he began hearing voices that was telling him to kill himself.  He now believes that he needs to be admitted to the psychiatric unit and is in agreement to admission.  During evaluation Damon [name] is observed sitting in the assessment room in no acute distress.  He is casually dressed and makes good eye contact.  He is alert/oriented x 4, cooperative, and attentive.  He has normal speech and behavior.  He endorses depression with feelings of hopelessness, fatigue, decreased appetite, decreased sleep, and worthlessness at times.  He has a depressed affect.  He reports only been able to  sleep a few hours over the past 4 days.  He continues to endorse suicidal ideations and cannot contract for safety.  He denies homicidal ideations.  He continues to endorse auditory hallucinations of hearing multiple voices that tell him to kill himself.  He also hears voices that sometimes tell him that people are trying to poison his food.  He denies visual hallucinations.  He does not appear psychotic, manic, or paranoid.  However he does endorse feeling a little paranoid at times.  He is able to answer questions appropriately and remained calm throughout the assessment.    Total Time spent with patient: 30 minutes  Musculoskeletal  Strength & Muscle Tone: within normal limits Gait & Station: normal Patient leans: N/A  Psychiatric Specialty Exam  Presentation General Appearance:  Appropriate for Environment; Casual  Eye Contact: Good  Speech: Clear and Coherent; Normal Rate  Speech Volume: Normal  Handedness: Right   Mood and Affect  Mood: Anxious; Depressed  Affect: Congruent   Thought Process  Thought Processes: Coherent  Descriptions of Associations:Intact  Orientation:Full (Time, Place and Person)  Thought Content:Logical; Paranoid Ideation  Diagnosis of Schizophrenia or Schizoaffective disorder in past: No  Duration of Psychotic Symptoms: Greater than six months  Hallucinations:Hallucinations: Auditory Description of Auditory Hallucinations: to kill myselt  Ideas of Reference:None  Suicidal Thoughts:Suicidal Thoughts: Yes, Active SI Active Intent and/or Plan: Without Plan  Homicidal Thoughts:Homicidal Thoughts: No   Sensorium  Memory: Immediate Good; Recent Good; Remote Good  Judgment: Fair  Insight: Chief Executive Officer  Concentration: Good  Attention Span: Good  Recall: Good  Fund of Knowledge: Good  Language: Good   Psychomotor Activity  Psychomotor Activity: Psychomotor Activity: Normal   Assets   Assets: Communication Skills; Desire for Improvement; Leisure Time; Physical Health; Social Support; Health and safety inspector; Housing; Intimacy; Resilience   Sleep  Sleep: Sleep: Poor Number of Hours of Sleep: 4   Nutritional Assessment (For OBS and FBC admissions only) Has the patient had a weight loss or gain of 10 pounds or more in the last 3 months?: No Has the patient had a decrease in food intake/or appetite?: Yes Does the patient have dental problems?: No Does the patient have eating habits or behaviors that may be indicators of an eating disorder including binging or inducing vomiting?: No Has the patient recently lost weight without trying?: 2.0 Has the patient been eating poorly because of a decreased appetite?: 1 Malnutrition Screening Tool Score: 3    Physical Exam Vitals and nursing note reviewed.  Constitutional:      Appearance: Normal appearance.  HENT:     Head: Normocephalic.  Eyes:     General:        Right eye: No discharge.        Left eye: No discharge.  Cardiovascular:     Rate and Rhythm: Normal rate.  Pulmonary:     Effort: Pulmonary effort is normal. No respiratory distress.  Musculoskeletal:        General: Normal range of motion.     Cervical back: Normal range of motion.  Skin:    Coloration: Skin is not jaundiced or pale.  Neurological:     Mental Status: He is alert and oriented to person, place, and time.  Psychiatric:        Attention and Perception: He perceives auditory hallucinations.        Mood and Affect: Mood is anxious and depressed.        Speech: Speech normal.        Behavior: Behavior is cooperative.        Thought Content: Thought content is paranoid. Thought content includes suicidal ideation. Thought content does not include suicidal plan.        Cognition and Memory: Cognition normal.        Judgment: Judgment normal.    Review of Systems  Constitutional: Negative.   HENT: Negative.    Eyes: Negative.    Respiratory: Negative.    Cardiovascular: Negative.   Musculoskeletal: Negative.   Skin: Negative.   Neurological: Negative.   Psychiatric/Behavioral:  Positive for depression, hallucinations and suicidal ideas. The patient is nervous/anxious and has insomnia.     Blood pressure 137/88, pulse 93, temperature 98.2 F (36.8 C), temperature source Oral, resp. rate 18, SpO2 100%. There is no height or weight on file to calculate BMI.  Past Psychiatric History: Schizophrenia  Is the patient at risk to self? Yes  Has the patient been a risk to self in the past 6 months? Yes .    Has the patient been a risk to self within the distant past? Yes   Is the patient a risk to others? No   Has the patient been a risk to others in the past 6 months? No   Has the patient been a risk to others within the distant past? No   Past Medical History: hx of schizophrenia in brother, sister, father, and mother as per patient report   Family History: Receives disability, unemployed Married but lives in  separate home from spouse Nicotine daily, no other substance use  Social History:  Receives disability, unemployed Married but lives in separate home from spouse Nicotine daily, no other substance use  Last Labs:  Admission on 09/10/2023, Discharged on 09/10/2023  Component Date Value Ref Range Status   Sodium 09/10/2023 136  135 - 145 mmol/L Final   Potassium 09/10/2023 4.0  3.5 - 5.1 mmol/L Final   Chloride 09/10/2023 106  98 - 111 mmol/L Final   CO2 09/10/2023 25  22 - 32 mmol/L Final   Glucose, Bld 09/10/2023 101 (H)  70 - 99 mg/dL Final   Glucose reference range applies only to samples taken after fasting for at least 8 hours.   BUN 09/10/2023 10  6 - 20 mg/dL Final   Creatinine, Ser 09/10/2023 1.25 (H)  0.61 - 1.24 mg/dL Final   Calcium 21/30/8657 9.1  8.9 - 10.3 mg/dL Final   Total Protein 84/69/6295 7.4  6.5 - 8.1 g/dL Final   Albumin 28/41/3244 4.1  3.5 - 5.0 g/dL Final   AST 11/17/7251  23  15 - 41 U/L Final   ALT 09/10/2023 21  0 - 44 U/L Final   Alkaline Phosphatase 09/10/2023 40  38 - 126 U/L Final   Total Bilirubin 09/10/2023 0.8  0.3 - 1.2 mg/dL Final   GFR, Estimated 09/10/2023 >60  >60 mL/min Final   Comment: (NOTE) Calculated using the CKD-EPI Creatinine Equation (2021)    Anion gap 09/10/2023 5  5 - 15 Final   Performed at [facility], [address] [place], [place] [address]   Alcohol, Ethyl (B) 09/10/2023 <10  <10 mg/dL Final   Comment: (NOTE) Lowest detectable limit for serum alcohol is 10 mg/dL.  For medical purposes only. Performed at [facility], [address], [place], [place] [address]    Salicylate Lvl 09/10/2023 <7.0 (L)  7.0 - 30.0 mg/dL Final   Performed at [facility], [address] [place], [place] [address]   Acetaminophen (Tylenol), Serum 09/10/2023 <10 (L)  10 - 30 ug/mL Final   Comment: (NOTE) Therapeutic concentrations vary significantly. A range of 10-30 ug/mL  may be an effective concentration for many patients. However, some  are best treated at concentrations outside of this range. Acetaminophen concentrations >150 ug/mL at 4 hours after ingestion  and >50 ug/mL at 12 hours after ingestion are often associated with  toxic reactions.  Performed at [facility], [address], [place], [place] [address]    WBC 09/10/2023 5.5  4.0 - 10.5 K/uL Final   RBC 09/10/2023 4.54  4.22 - 5.81 MIL/uL Final   Hemoglobin 09/10/2023 13.7  13.0 - 17.0 g/dL Final   HCT 33/29/5188 41.3  39.0 - 52.0 % Final   MCV 09/10/2023 91.0  80.0 - 100.0 fL Final   MCH 09/10/2023 30.2  26.0 - 34.0 pg Final   MCHC 09/10/2023 33.2  30.0 - 36.0 g/dL Final   RDW 41/66/0630 12.8  11.5 - 15.5 % Final   Platelets 09/10/2023 335  150 - 400 K/uL Final   nRBC 09/10/2023 0.0  0.0 - 0.2 % Final   Performed at [facility], [address] [place], [place] [address]   Opiates 09/10/2023 NONE DETECTED  NONE DETECTED Final   Cocaine  09/10/2023 NONE DETECTED  NONE DETECTED Final   Benzodiazepines 09/10/2023 NONE DETECTED  NONE DETECTED Final   Amphetamines 09/10/2023 NONE DETECTED  NONE DETECTED Final  Tetrahydrocannabinol 09/10/2023 NONE DETECTED  NONE DETECTED Final   Barbiturates 09/10/2023 NONE DETECTED  NONE DETECTED Final   Comment: (NOTE) DRUG SCREEN FOR MEDICAL PURPOSES ONLY.  IF CONFIRMATION IS NEEDED FOR ANY PURPOSE, NOTIFY LAB WITHIN 5 DAYS.  LOWEST DETECTABLE LIMITS FOR URINE DRUG SCREEN Drug Class                     Cutoff (ng/mL) Amphetamine and metabolites    1000 Barbiturate and metabolites    200 Benzodiazepine                 200 Opiates and metabolites        300 Cocaine and metabolites        300 THC                            50 Performed at [facility], [address] [place], [place] [address]   Admission on 06/12/2023, Discharged on 06/13/2023  Component Date Value Ref Range Status   Sodium 06/12/2023 139  135 - 145 mmol/L Final   Potassium 06/12/2023 3.6  3.5 - 5.1 mmol/L Final   Chloride 06/12/2023 106  98 - 111 mmol/L Final   CO2 06/12/2023 23  22 - 32 mmol/L Final   Glucose, Bld 06/12/2023 93  70 - 99 mg/dL Final   Glucose reference range applies only to samples taken after fasting for at least 8 hours.   BUN 06/12/2023 10  6 - 20 mg/dL Final   Creatinine, Ser 06/12/2023 1.06  0.61 - 1.24 mg/dL Final   Calcium 44/11/270 9.1  8.9 - 10.3 mg/dL Final   Total Protein 53/66/4403 7.4  6.5 - 8.1 g/dL Final   Albumin 47/42/5956 4.1  3.5 - 5.0 g/dL Final   AST 38/75/6433 22  15 - 41 U/L Final   ALT 06/12/2023 20  0 - 44 U/L Final   Alkaline Phosphatase 06/12/2023 42  38 - 126 U/L Final   Total Bilirubin 06/12/2023 0.9  0.3 - 1.2 mg/dL Final   GFR, Estimated 06/12/2023 >60  >60 mL/min Final   Comment: (NOTE) Calculated using the CKD-EPI Creatinine Equation (2021)    Anion gap 06/12/2023 10  5 - 15 Final   Performed at [facility], [address], [place],  [place] [address]   Alcohol, Ethyl (B) 06/12/2023 <10  <10 mg/dL Final   Comment: (NOTE) Lowest detectable limit for serum alcohol is 10 mg/dL.  For medical purposes only. Performed at [facility], [address] [place], [place] [address]    Salicylate Lvl 06/12/2023 <7.0 (L)  7.0 - 30.0 mg/dL Final   Performed at [facility], [address], [place], [place] [address]   Acetaminophen (Tylenol), Serum 06/12/2023 <10 (L)  10 - 30 ug/mL Final   Comment: (NOTE) Therapeutic concentrations vary significantly. A range of 10-30 ug/mL  may be an effective concentration for many patients. However, some  are best treated at concentrations outside of this range. Acetaminophen concentrations >150 ug/mL at 4 hours after ingestion  and >50 ug/mL at 12 hours after ingestion are often associated with  toxic reactions.  Performed at [facility], [address], [place], [place] [address]    WBC 06/12/2023 5.0  4.0 - 10.5 K/uL Final   RBC 06/12/2023 4.35  4.22 - 5.81 MIL/uL Final   Hemoglobin 06/12/2023 13.2  13.0 - 17.0 g/dL  Final   HCT 06/12/2023 40.0  39.0 - 52.0 % Final   MCV 06/12/2023 92.0  80.0 - 100.0 fL Final   MCH 06/12/2023 30.3  26.0 - 34.0 pg Final   MCHC 06/12/2023 33.0  30.0 - 36.0 g/dL Final   RDW 19/14/7829 12.4  11.5 - 15.5 % Final   Platelets 06/12/2023 328  150 - 400 K/uL Final   nRBC 06/12/2023 0.0  0.0 - 0.2 % Final   Performed at [facility] Lab, [address] [place], [place] [address]   Opiates 06/12/2023 NONE DETECTED  NONE DETECTED Final   Cocaine 06/12/2023 NONE DETECTED  NONE DETECTED Final   Benzodiazepines 06/12/2023 NONE DETECTED  NONE DETECTED Final   Amphetamines 06/12/2023 NONE DETECTED  NONE DETECTED Final   Tetrahydrocannabinol 06/12/2023 NONE DETECTED  NONE DETECTED Final   Barbiturates 06/12/2023 NONE DETECTED  NONE DETECTED Final   Comment: (NOTE) DRUG SCREEN FOR MEDICAL PURPOSES ONLY.  IF CONFIRMATION IS NEEDED FOR ANY PURPOSE,  NOTIFY LAB WITHIN 5 DAYS.  LOWEST DETECTABLE LIMITS FOR URINE DRUG SCREEN Drug Class                     Cutoff (ng/mL) Amphetamine and metabolites    1000 Barbiturate and metabolites    200 Benzodiazepine                 200 Opiates and metabolites        300 Cocaine and metabolites        300 THC                            50 Performed at [facility], [address], [place], [place] [address]    Color, Urine 06/12/2023 YELLOW  YELLOW Final   APPearance 06/12/2023 CLEAR  CLEAR Final   Specific Gravity, Urine 06/12/2023 1.020  1.005 - 1.030 Final   pH 06/12/2023 6.0  5.0 - 8.0 Final   Glucose, UA 06/12/2023 NEGATIVE  NEGATIVE mg/dL Final   Hgb urine dipstick 06/12/2023 NEGATIVE  NEGATIVE Final   Bilirubin Urine 06/12/2023 NEGATIVE  NEGATIVE Final   Ketones, ur 06/12/2023 NEGATIVE  NEGATIVE mg/dL Final   Protein, ur 84/69/6295 NEGATIVE  NEGATIVE mg/dL Final   Nitrite 28/41/3244 NEGATIVE  NEGATIVE Final   Leukocytes,Ua 06/12/2023 NEGATIVE  NEGATIVE Final   Performed at [facility], [address], [place], [place] [address]    Allergies: Spinach and Zofran [ondansetron]  Medications:  Facility Ordered Medications  Medication   [COMPLETED] hydrALAZINE (APRESOLINE) tablet 50 mg   [COMPLETED] acetaminophen (TYLENOL) tablet 650 mg   acetaminophen (TYLENOL) tablet 650 mg   alum & mag hydroxide-simeth (MAALOX/MYLANTA) 200-200-20 MG/5ML suspension 30 mL   magnesium hydroxide (MILK OF MAGNESIA) suspension 30 mL   traZODone (DESYREL) tablet 50 mg   hydrOXYzine (ATARAX) tablet 25 mg   [START ON 09/11/2023] benazepril (LOTENSIN) tablet 10 mg   hydrALAZINE (APRESOLINE) tablet 25 mg   risperiDONE (RISPERDAL) tablet 3 mg   nicotine (NICODERM CQ - dosed in mg/24 hours) patch 21 mg   PTA Medications  Medication Sig   acetaminophen (TYLENOL) 325 MG tablet Take 650 mg by mouth every 6 (six) hours as needed for mild pain or headache.   traZODone (DESYREL) 50 MG tablet Take  1 tablet (50mg ) by mouth daily AT BEDTIME for Insomnia   benazepril (LOTENSIN) 10 MG tablet Take 1 tablet (10 mg total) by  mouth daily.   hydrALAZINE (APRESOLINE) 25 MG tablet Take 1 tablet (25 mg total) by mouth 3 (three) times daily as needed. If systolic is greater than 180, or diastolic greater than 110   risperiDONE (RISPERDAL) 3 MG tablet Take 1 tablet (3 mg total) by mouth 2 (two) times daily.      Medical Decision Making    Patient presents to [facility] with auditory hallucinations that are telling him to kill himself. He is recommended for inpatient admission and will be admitted to the continuous assessment unit while awaiting inpatient psychiatric bed availability   Recommendations   Based on my evaluation the patient does not appear to have an emergency medical condition.   Patient meets criteria for inpatient psychiatric admission and will be admitted to the continuous assessment unit while awaiting inpatient psychiatric bed availability.    [facility] notified and patient has been accepted to [facility].    Restarted home medications:  Risperdal 3 mg twice daily (dosage increased today while patient was evaluated at [facility] ED) Lotensin 10 mg daily for hypertension  Hydralazine 25 mg 3 times daily as needed for systolic blood pressure greater than 180 and dystolic blood pressure greater than 110 (both blood pressure medications initiated by emergency room physician on 09/10/2023)   Labs: CMP CBC, ethanol and UDS completed MCD. CT of head completed EKG completed  Ardis Hughs, NP 09/10/23  6:51 PM

## 2023-09-10 NOTE — ED Notes (Signed)
Patient requested a nicotine patch notified provider

## 2023-09-10 NOTE — ED Notes (Signed)
Patient consent is in the chart. Rn read consent to patient and patient verbalized understanding before signing.

## 2023-09-10 NOTE — ED Provider Notes (Signed)
[place] EMERGENCY DEPARTMENT AT [facility] Provider Note   CSN: [id number] Arrival date & time: 09/10/23  1023     History  Chief Complaint  Patient presents with   Psychiatric Evaluation   Hypertension    Damon [name] is a 57 y.o. male with past medical history significant for paranoid schizophrenia, poorly controlled hypertension, depression who presents with concern for worsening of his baseline schizophrenia.  Patient reports he has been taking his Risperdal, trazodone and his blood pressure medication as prescribed, but began hearing voices on Monday.  He cannot tell me what they are saying.  He reports that he has had some changes in mood, reports intermittent suicidal ideation, but denies any suicidal ideation at time of our interview.  He does not have any plans for killing himself.  He reports that he had a headache that began 2 days ago which he attributed to his poorly controlled blood pressure, he denies any numbness, tingling, vision changes, dizziness, weakness.  He denies any homicidal ideation.   Hypertension       Home Medications Prior to Admission medications   Medication Sig Start Date End Date Taking? Authorizing Provider  benazepril (LOTENSIN) 10 MG tablet Take 1 tablet (10 mg total) by mouth daily. 09/10/23  Yes Marq Rebello H, PA-C  hydrALAZINE (APRESOLINE) 25 MG tablet Take 1 tablet (25 mg total) by mouth 3 (three) times daily as needed. If systolic is greater than 180, or diastolic greater than 110 09/10/23  Yes Glendale Youngblood H, PA-C  risperiDONE (RISPERDAL) 3 MG tablet Take 1 tablet (3 mg total) by mouth 2 (two) times daily. 09/10/23  Yes Ianmichael Amescua H, PA-C  acetaminophen (TYLENOL) 325 MG tablet Take 650 mg by mouth every 6 (six) hours as needed for mild pain or headache.    [provider]  traZODone (DESYREL) 50 MG tablet Take 1 tablet (50mg ) by mouth daily AT BEDTIME for Insomnia 11/12/22          Allergies    Spinach and Zofran [ondansetron]    Review of Systems   Review of Systems  All other systems reviewed and are negative.   Physical Exam Updated Vital Signs BP (!) 170/98 (BP Location: Right Arm)   Pulse 78   Temp 98.3 F (36.8 C)   Resp 16   Ht 5\' 11"  (1.803 m)   Wt 83 kg   SpO2 100%   BMI 25.52 kg/m  Physical Exam Vitals and nursing note reviewed.  Constitutional:      General: He is not in acute distress.    Appearance: Normal appearance.  HENT:     Head: Normocephalic and atraumatic.  Eyes:     General:        Right eye: No discharge.        Left eye: No discharge.  Cardiovascular:     Rate and Rhythm: Normal rate and regular rhythm.     Heart sounds: No murmur heard.    No friction rub. No gallop.  Pulmonary:     Effort: Pulmonary effort is normal.     Breath sounds: Normal breath sounds.  Abdominal:     General: Bowel sounds are normal.     Palpations: Abdomen is soft.  Skin:    General: Skin is warm and dry.     Capillary Refill: Capillary refill takes less than 2 seconds.  Neurological:     Mental Status: He is alert and oriented to person, place, and time.  Comments: Cranial nerves II through XII grossly intact.  Intact finger-nose, intact heel-to-shin.  Romberg negative, gait normal.  Alert and oriented x3.  Moves all 4 limbs spontaneously, normal coordination.  No pronator drift.  Intact strength 5 out of 5 bilateral upper and lower extremities.    Psychiatric:        Mood and Affect: Mood normal.        Behavior: Behavior normal.     ED Results / Procedures / Treatments   Labs (all labs ordered are listed, but only abnormal results are displayed) Labs Reviewed  COMPREHENSIVE METABOLIC PANEL - Abnormal; Notable for the following components:      Result Value   Glucose, Bld 101 (*)    Creatinine, Ser 1.25 (*)    All other components within normal limits  SALICYLATE LEVEL - Abnormal; Notable for the following components:    Salicylate Lvl <7.0 (*)    All other components within normal limits  ACETAMINOPHEN LEVEL - Abnormal; Notable for the following components:   Acetaminophen (Tylenol), Serum <10 (*)    All other components within normal limits  ETHANOL  CBC  RAPID URINE DRUG SCREEN, HOSP PERFORMED    EKG EKG Interpretation Date/Time:  Saturday September 10 2023 12:05:55 EDT Ventricular Rate:  72 PR Interval:  178 QRS Duration:  90 QT Interval:  390 QTC Calculation: 427 R Axis:   73  Text Interpretation: Normal sinus rhythm Minimal voltage criteria for LVH, may be normal variant ( Sokolow-Lyon ) T wave abnormality, consider inferolateral ischemia Abnormal ECG When compared with ECG of 12-Jun-2023 16:36, PREVIOUS ECG IS PRESENT No significant change since last tracing Confirmed by Jacalyn Lefevre [phone]) on 09/10/2023 12:13:23 PM  Radiology CT Head Wo Contrast  Result Date: 09/10/2023 CLINICAL DATA:  Headache. EXAM: CT HEAD WITHOUT CONTRAST TECHNIQUE: Contiguous axial images were obtained from the base of the skull through the vertex without intravenous contrast. RADIATION DOSE REDUCTION: This exam was performed according to the departmental dose-optimization program which includes automated exposure control, adjustment of the mA and/or kV according to patient size and/or use of iterative reconstruction technique. COMPARISON:  CT head dated 10/10/2022. FINDINGS: Brain: No evidence of acute infarction, hemorrhage, hydrocephalus, extra-axial collection or mass lesion/mass effect. Vascular: There are vascular calcifications in the carotid siphons. Skull: Normal. Negative for fracture or focal lesion. Sinuses/Orbits: There is right maxillary sinus disease. Other: None. IMPRESSION: 1. No acute intracranial process. Electronically Signed   By: Romona Curls M.D.   On: 09/10/2023 13:02    Procedures Procedures    Medications Ordered in ED Medications  benazepril (LOTENSIN) tablet 10 mg (has no administration in  time range)  hydrALAZINE (APRESOLINE) tablet 50 mg (50 mg Oral Given 09/10/23 1357)  acetaminophen (TYLENOL) tablet 650 mg (650 mg Oral Given 09/10/23 1357)    ED Course/ Medical Decision Making/ A&P                                 Medical Decision Making Amount and/or Complexity of Data Reviewed Labs: ordered. Radiology: ordered.  Risk OTC drugs. Prescription drug management.   This patient is a 57 y.o. male  who presents to the ED for concern of elevated blood pressure, hypertension, as well as acute on chronic worsening of schizophrenia with increased auditory hallucinations despite taking no medications..   Differential diagnoses prior to evaluation: The emergent differential diagnosis includes, but is not limited to, acute on chronic  worsening of schizophrenia, new onset psychosis, suicidal or homicidal ideation, for his elevated blood pressure, and headache considered hypertensive urgency, emergency,. This is not an exhaustive differential.   Past Medical History / Co-morbidities / Social History: Schizophrenia, hypertension  Additional history: Chart reviewed. Pertinent results include: Reviewed previous emergency department evaluations, including lab work, imaging, psychiatric evaluations, as well as outpatient evaluation at [facility]  Physical Exam: Physical exam performed. The pertinent findings include: Patient with no focal neurologic deficits, his mood is appropriate despite endorsing some auditory hallucinations, he denies any SI, HI in the emergency department today.  Lab Tests/Imaging studies: I personally interpreted labs/imaging and the pertinent results include: CBC unremarkable, CMP is notable for a mild increase in creatinine, 1.25, likely secondary to poorly controlled hypertension.  His UDS is unremarkable, negative salicylate, acetaminophen, ethanol level..  I independently interpreted CT head without contrast which shows no evidence of acute intracranial  abnormality.  I agree with the radiologist interpretation.  Cardiac monitoring: EKG obtained and interpreted by myself and attending physician which shows: Normal sinus rhythm, LVH, no significant change from last tracing   Medications: I ordered medication including Tylenol, hydralazine, and twice the dose of his home blood pressure medication.  I have reviewed the patients home medicines and have made adjustments as needed.   Consults: Hillery Jacks, NP with psychiatry spoke with the patient and agrees that he does not need inpatient admission at this time, but would recommend increasing his Risperdal to 3 mg by mouth twice daily and have a close follow-up with his psychiatric services.  I agree with this plan regarding his auditory hallucinations.  Given his blood pressure on arrival of 185/121 with headache, and mild bump in creatinine I would like to improve his blood pressure prior to emergency department discharge.  We will also discharge with increase of home blood pressure medication.  On reassessment blood pressure still elevated but markedly improved, 170/98.  He endorses headache is improved.  Will discharge with increase blood pressure medication doses and increased dose of Risperdal as counseled by psychiatry.  Plan at time of handoff is discharged with increased dose of risperidone, and increase of blood pressure medication, encourage close follow-up with PCP, psychiatry. This may be altered or completely changed at the discretion of the oncoming team pending results of further workup.   Final Clinical Impression(s) / ED Diagnoses Final diagnoses:  Auditory hallucinations  Poorly-controlled hypertension    Rx / DC Orders ED Discharge Orders          Ordered    benazepril (LOTENSIN) 10 MG tablet  Daily        09/10/23 1502    hydrALAZINE (APRESOLINE) 25 MG tablet  3 times daily PRN        09/10/23 1502    risperiDONE (RISPERDAL) 3 MG tablet  2 times daily        09/10/23  1502              Amaura Authier, [place], PA-C 09/10/23 1505    Jacalyn Lefevre, MD 09/11/23 [phone]

## 2023-09-11 DIAGNOSIS — F200 Paranoid schizophrenia: Secondary | ICD-10-CM

## 2023-09-11 LAB — LIPID PANEL
Cholesterol: 197 mg/dL (ref 0–200)
HDL: 83 mg/dL (ref >40)
LDL Cholesterol: 59 mg/dL (ref 0–99)
Total CHOL/HDL Ratio: 2.4 {ratio}
Triglycerides: 276 mg/dL — ABNORMAL HIGH (ref <150)
VLDL: 55 mg/dL — ABNORMAL HIGH (ref 0–40)

## 2023-09-11 LAB — TSH: TSH: 1.375 u[IU]/mL (ref 0.350–4.500)

## 2023-09-11 LAB — HEMOGLOBIN A1C
Hgb A1c MFr Bld: 5.7 % — ABNORMAL HIGH (ref 4.8–5.6)
Mean Plasma Glucose: 116.89 mg/dL

## 2023-09-11 MED ORDER — ENSURE ENLIVE PO LIQD
237.0000 mL | Freq: Two times a day (BID) | ORAL | Status: DC
  Administered 2023-09-11 – 2023-09-17 (×13): 237 mL via ORAL

## 2023-09-11 MED ORDER — ACETAMINOPHEN 325 MG PO TABS
650.0000 mg | ORAL_TABLET | Freq: Four times a day (QID) | ORAL | Status: DC | PRN
  Administered 2023-09-16 – 2023-09-17 (×2): 650 mg via ORAL
  Filled 2023-09-11 (×4): qty 2

## 2023-09-11 MED ORDER — HALOPERIDOL LACTATE 5 MG/ML IJ SOLN: 5.0000 mg | Freq: Three times a day (TID) | INTRAMUSCULAR | Status: DC | PRN

## 2023-09-11 MED ORDER — LORAZEPAM 2 MG PO TABS: 2.0000 mg | ORAL_TABLET | Freq: Three times a day (TID) | ORAL | Status: DC | PRN

## 2023-09-11 MED ORDER — DIPHENHYDRAMINE HCL 50 MG/ML IJ SOLN: 50.0000 mg | Freq: Three times a day (TID) | INTRAMUSCULAR | Status: DC | PRN

## 2023-09-11 MED ORDER — RISPERIDONE 1 MG PO TABS
3.0000 mg | ORAL_TABLET | Freq: Two times a day (BID) | ORAL | Status: DC
  Administered 2023-09-11 – 2023-09-17 (×13): 3 mg via ORAL
  Filled 2023-09-11 (×13): qty 3

## 2023-09-11 MED ORDER — TRAZODONE HCL 50 MG PO TABS
50.0000 mg | ORAL_TABLET | Freq: Every evening | ORAL | Status: DC | PRN
  Administered 2023-09-11 – 2023-09-14 (×2): 50 mg via ORAL
  Filled 2023-09-11 (×4): qty 1

## 2023-09-11 MED ORDER — HYDROXYZINE HCL 25 MG PO TABS
25.0000 mg | ORAL_TABLET | Freq: Three times a day (TID) | ORAL | Status: DC | PRN
  Administered 2023-09-12: 25 mg via ORAL
  Filled 2023-09-11: qty 1

## 2023-09-11 MED ORDER — HYDRALAZINE HCL 25 MG PO TABS: 25.0000 mg | ORAL_TABLET | Freq: Three times a day (TID) | ORAL | Status: DC | PRN

## 2023-09-11 MED ORDER — DIPHENHYDRAMINE HCL 25 MG PO CAPS: 50.0000 mg | ORAL_CAPSULE | Freq: Three times a day (TID) | ORAL | Status: DC | PRN

## 2023-09-11 MED ORDER — ALUM & MAG HYDROXIDE-SIMETH 200-200-20 MG/5ML PO SUSP: 30.0000 mL | ORAL | Status: DC | PRN

## 2023-09-11 MED ORDER — NICOTINE 21 MG/24HR TD PT24
21.0000 mg | MEDICATED_PATCH | Freq: Every day | TRANSDERMAL | Status: DC
  Administered 2023-09-11 – 2023-09-17 (×7): 21 mg via TRANSDERMAL
  Filled 2023-09-11 (×7): qty 1

## 2023-09-11 MED ORDER — MAGNESIUM HYDROXIDE 400 MG/5ML PO SUSP: 30.0000 mL | Freq: Every day | ORAL | Status: DC | PRN

## 2023-09-11 MED ORDER — LORAZEPAM 2 MG/ML IJ SOLN: 2.0000 mg | Freq: Three times a day (TID) | INTRAMUSCULAR | Status: DC | PRN

## 2023-09-11 MED ORDER — BENAZEPRIL HCL 10 MG PO TABS
10.0000 mg | ORAL_TABLET | Freq: Every day | ORAL | Status: DC
  Administered 2023-09-11 – 2023-09-12 (×2): 10 mg via ORAL
  Filled 2023-09-11 (×2): qty 1

## 2023-09-11 MED ORDER — HALOPERIDOL 5 MG PO TABS: 5.0000 mg | ORAL_TABLET | Freq: Three times a day (TID) | ORAL | Status: DC | PRN

## 2023-09-11 NOTE — Group Note (Signed)
Date:  09/11/2023 Time:  10:25 AM  Group Topic/Focus:  Goals Group:   The focus of this group is to help patients establish daily goals to achieve during treatment and discuss how the patient can incorporate goal setting into their daily lives to aide in recovery. Healthy Communication:   The focus of this group is to discuss communication, barriers to communication, as well as healthy ways to communicate with others. Identifying Needs:   The focus of this group is to help patients identify their personal needs that have been historically problematic and identify healthy behaviors to address their needs.    Participation Level:  Active  Participation Quality:  Appropriate, Attentive, Sharing, Supportive, and    Affect:  Appropriate  Cognitive:  Alert, Appropriate, and Oriented  Insight: Appropriate  Engagement in Group:  Developing/Improving and Engaged  Modes of Intervention:  Activity, Discussion, Education, and Socialization  Additional Comments:    Damon [name] 09/11/2023, 10:25 AM
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
## 2023-09-11 NOTE — Progress Notes (Signed)
D- Patient alert and oriented x 4. Affect bright/mood congruent. Denies SI/ HI/ AVH. States he dis have some AVH yesterday but now he back on Risperidone and it is "better today". Voices are non commanding but "annoying". Patient denies pain. Patient endorses mild depression and anxiety. He states "I just want to get back on track with my medications". A- Scheduled medications administered to patient, per MD orders. Support and encouragement provided.  Routine safety checks conducted every 15 minutes without incident.  Patient informed to notify staff with problems or concerns and verbalizes understanding. R- No adverse drug reactions noted.  Patient compliant with medications and treatment plan. Patient receptive, calm ,cooperative and interacts well with others on the unit.  Patient contracts for safety and  remains safe on the unit at this time.

## 2023-09-11 NOTE — Progress Notes (Signed)
57 y/o male went to [facility] ED, was discharged and went to [facility] for auditory hallucination , Suicidal ideation. Verbalized hearing voices about hurting self and being poisoned. Diagnosis of Paranoid Schizophrenia.  On admission, pt. verbalized he smokes cigarettes but no alcohol or drugs. He stated he had his flu shot a month ago.  Pt. Verbalized hx of HTN- BP was 142/92 on admission. Patient stated he presently lives alone. He verbalized his wife moved out to her mom due to his recent illness.

## 2023-09-11 NOTE — Group Note (Signed)
Date:  09/11/2023 Time:  5:06 PM  Group Topic/Focus:  STRUCTURED GROUP ACTIVITY  The focus of the group is to promote activity for the patients to encourage exercise to go out in the courtyard and get some exercise.     Participation Level:  Did Not Attend   Damon [name] 09/11/2023, 5:06 PM

## 2023-09-11 NOTE — Group Note (Signed)
LCSW Group Therapy Note  Group Date: 09/11/2023 Start Time: 1410 End Time: 1455   Type of Therapy and Topic:  Group Therapy - Coping Skills  Participation Level:  Active   Description of Group The focus of this group was to determine what unhealthy coping techniques typically are used by group members and what healthy coping techniques would be helpful in coping with various problems. Patients were guided in becoming aware of the differences between healthy and unhealthy coping techniques. Patients were asked to identify 2-3 healthy coping skills they would like to learn to use more effectively.  Therapeutic Goals Patients learned that coping is what human beings do all day long to deal with various situations in their lives Patients defined and discussed healthy vs unhealthy coping techniques Patients identified their preferred coping techniques and identified whether these were healthy or unhealthy Patients determined 2-3 healthy coping skills they would like to become more familiar with and use more often. Patients provided support and ideas to each other   Summary of Patient Progress:  The patient attended group.Patient proved open to input from peers and feedback from [facility]. Patient demonstrated  insight into the subject matter, was respectful of peers, and participated throughout the entire session. The patient participated during the game of coping skills bingo. The patient stated that it is sometimes hard to walk away.     Marshell Levan, LCSWA 09/11/2023  3:02 PM

## 2023-09-11 NOTE — Plan of Care (Signed)
  Problem: Education: Goal: Verbalization of understanding the information provided will improve Outcome: Progressing   Problem: Education: Goal: Mental status will improve Outcome: Progressing   Problem: Education: Goal: Emotional status will improve Outcome: Progressing

## 2023-09-11 NOTE — [facility] Suicide Risk Assessment (Cosign Needed Addendum)
[facility] Admission Suicide Risk Assessment   Nursing information obtained from:  Patient Demographic factors:  Male Current Mental Status:  Suicidal ideation indicated by patient Loss Factors:  NA Historical Factors:  NA Risk Reduction Factors:   (Wife moved out to mom.)  Total Time spent with patient: 1 hour Principal Problem: Paranoid schizophrenia (HCC) Diagnosis:  Principal Problem:   Paranoid schizophrenia (HCC)  Subjective Data: "I was hearing voices"  Continued Clinical Symptoms:    The "Alcohol Use Disorders Identification Test", Guidelines for Use in Primary Care, Second Edition.  World Science writer [facility]). Score between 0-7:  no or low risk or alcohol related problems. Score between 8-15:  moderate risk of alcohol related problems. Score between 16-19:  high risk of alcohol related problems. Score 20 or above:  warrants further diagnostic evaluation for alcohol dependence and treatment.   CLINICAL FACTORS:   Schizophrenia:   Paranoid or undifferentiated type  Musculoskeletal: Strength & Muscle Tone: within normal limits Gait & Station: normal Patient leans: N/A   Psychiatric Specialty Exam: Physical Exam Vitals and nursing note reviewed.  Constitutional:      Appearance: Normal appearance.  HENT:     Head: Normocephalic.     Nose: Nose normal.  Pulmonary:     Effort: Pulmonary effort is normal.  Musculoskeletal:        General: Normal range of motion.     Cervical back: Normal range of motion.  Neurological:     General: No focal deficit present.     Mental Status: He is alert and oriented to person, place, and time.       Review of Systems  Psychiatric/Behavioral:  Positive for depression and hallucinations. The patient is nervous/anxious.   All other systems reviewed and are negative.    Blood pressure (!) 164/96, pulse 82, temperature 98.6 F (37 C), temperature source Oral, resp. rate 16, weight 78.5 kg, SpO2 100%.Body mass index is 24.13 kg/m.   General Appearance: Casual  Eye Contact:  Fair  Speech:  Normal Rate  Volume:  Normal  Mood:  Anxious and Depressed  Affect:  Congruent  Thought Process:  Coherent  Orientation:  Full (Time, Place, and Person)  Thought Content:  Hallucinations: Auditory  Suicidal Thoughts:  No  Homicidal Thoughts:  No  Memory:  Immediate;   Good Recent;   Good Remote;   Good  Judgement:  Fair  Insight:  Good  Psychomotor Activity:  Normal  Concentration:  Concentration: Fair and Attention Span: Fair  Recall:  Good  Fund of Knowledge:  Good  Language:  Good  Akathisia:  No  Handed:  Right  AIMS (if indicated):     Assets:  Housing Intimacy Leisure Time Physical Health Resilience Social Support  ADL's:  Intact  Cognition:  WNL  Sleep:          Physical Exam: Physical Exam Vitals and nursing note reviewed.  Constitutional:      Appearance: Normal appearance.  HENT:     Head: Normocephalic.     Nose: Nose normal.  Pulmonary:     Effort: Pulmonary effort is normal.  Musculoskeletal:        General: Normal range of motion.     Cervical back: Normal range of motion.  Neurological:     General: No focal deficit present.     Mental Status: He is alert and oriented to person, place, and time.    Review of Systems  Psychiatric/Behavioral:  Positive for depression.   All other  systems reviewed and are negative.  Blood pressure (!) 164/96, pulse 82, temperature 98.6 F (37 C), temperature source Oral, resp. rate 16, weight 78.5 kg, SpO2 100%. Body mass index is 24.13 kg/m.   COGNITIVE FEATURES THAT CONTRIBUTE TO RISK:  None    SUICIDE RISK:   Mild:  Suicidal ideation of limited frequency, intensity, duration, and specificity.  There are no identifiable plans, no associated intent, mild dysphoria and related symptoms, good self-control (both objective and subjective assessment), few other risk factors, and identifiable protective factors, including available and accessible  social support.  PLAN OF CARE:  Daily contact with patient to assess and evaluate symptoms and progress in treatment, Medication management, and Plan : Paranoid schizophrenia: Risperdal 3 mg BID EKG with no QT prolongation, lipid panel, TSH, and A1C ordered  Insomnia: Trazodone 50 mg daily at bedtime PRN  Anxiety: Hydroxyzine 25 mg TID PRN  I certify that inpatient services furnished can reasonably be expected to improve the patient's condition.   Nanine Means, NP 09/11/2023, 11:48 AM

## 2023-09-11 NOTE — Progress Notes (Addendum)
Pt is alert and oriented. Affect is bright/ mood is pleasant. He denies anxiety and depression. Pt denies pain,  SI/HI and AVH at this time.   Scheduled medications administered to patient per MD orders. Support and encouragement provided . Routine safety checks conducted every 15 minutes without incident. Patient informed to notify staff with problems or concerns and verbalize understanding.   No adverse drug reactions noted . Pt complaint with medications and treatment plan . Pt receptive, calm, cooperative and interacts well.

## 2023-09-11 NOTE — Plan of Care (Signed)
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
## 2023-09-11 NOTE — Group Note (Signed)
Date:  09/11/2023 Time:  9:14 PM  Group Topic/Focus:  Wrap-Up Group:   The focus of this group is to help patients review their daily goal of treatment and discuss progress on daily workbooks.    Participation Level:  Did Not Attend  Participation Quality:   none  Affect:   none  Cognitive:   none  Insight: None  Engagement in Group:   none  Modes of Intervention:   none  Additional Comments:  none   Belva Crome 09/11/2023, 9:14 PM

## 2023-09-12 DIAGNOSIS — I10 Essential (primary) hypertension: Secondary | ICD-10-CM

## 2023-09-12 MED ORDER — BENAZEPRIL HCL 20 MG PO TABS
20.0000 mg | ORAL_TABLET | Freq: Every day | ORAL | Status: DC
  Administered 2023-09-13 – 2023-09-15 (×3): 20 mg via ORAL
  Filled 2023-09-12 (×4): qty 1

## 2023-09-12 MED ORDER — AMLODIPINE BESYLATE 5 MG PO TABS
5.0000 mg | ORAL_TABLET | Freq: Every day | ORAL | Status: DC
  Administered 2023-09-12 – 2023-09-15 (×4): 5 mg via ORAL
  Filled 2023-09-12 (×4): qty 1

## 2023-09-12 MED ORDER — AMLODIPINE BESYLATE 5 MG PO TABS: 10.0000 mg | ORAL_TABLET | Freq: Every day | ORAL | Status: DC

## 2023-09-12 NOTE — BH IP Treatment Plan (Signed)
Interdisciplinary Treatment and Diagnostic Plan Update  09/12/2023 Time of Session: 09:31 Damon [name] MRN: [id number]  Principal Diagnosis: Paranoid schizophrenia (HCC)  Secondary Diagnoses: Principal Problem:   Paranoid schizophrenia (HCC)   Current Medications:  Current Facility-Administered Medications  Medication Dose Route Frequency Provider Last Rate Last Admin   acetaminophen (TYLENOL) tablet 650 mg  650 mg Oral Q6H PRN Ardis Hughs, NP       alum & mag hydroxide-simeth (MAALOX/MYLANTA) 200-200-20 MG/5ML suspension 30 mL  30 mL Oral Q4H PRN Ardis Hughs, NP       benazepril (LOTENSIN) tablet 10 mg  10 mg Oral Daily Ardis Hughs, NP   10 mg at 09/12/23 7846   diphenhydrAMINE (BENADRYL) capsule 50 mg  50 mg Oral TID PRN Ardis Hughs, NP       Or   diphenhydrAMINE (BENADRYL) injection 50 mg  50 mg Intramuscular TID PRN Ardis Hughs, NP       feeding supplement (ENSURE ENLIVE / ENSURE PLUS) liquid 237 mL  237 mL Oral BID BM Sarina Ill, DO   237 mL at 09/12/23 9629   haloperidol (HALDOL) tablet 5 mg  5 mg Oral TID PRN Ardis Hughs, NP       Or   haloperidol lactate (HALDOL) injection 5 mg  5 mg Intramuscular TID PRN Ardis Hughs, NP       hydrALAZINE (APRESOLINE) tablet 25 mg  25 mg Oral TID PRN Ardis Hughs, NP       hydrOXYzine (ATARAX) tablet 25 mg  25 mg Oral TID PRN Ardis Hughs, NP       LORazepam (ATIVAN) tablet 2 mg  2 mg Oral TID PRN Ardis Hughs, NP       Or   LORazepam (ATIVAN) injection 2 mg  2 mg Intramuscular TID PRN Ardis Hughs, NP       magnesium hydroxide (MILK OF MAGNESIA) suspension 30 mL  30 mL Oral Daily PRN Ardis Hughs, NP       nicotine (NICODERM CQ - dosed in mg/24 hours) patch 21 mg  21 mg Transdermal Daily Ardis Hughs, NP   21 mg at 09/12/23 5284   risperiDONE (RISPERDAL) tablet 3 mg  3 mg Oral BID Ardis Hughs, NP   3 mg at 09/12/23 1324   traZODone  (DESYREL) tablet 50 mg  50 mg Oral QHS PRN Ardis Hughs, NP   50 mg at 09/11/23 2126   PTA Medications: Medications Prior to Admission  Medication Sig Dispense Refill Last Dose   acetaminophen (TYLENOL) 325 MG tablet Take 650 mg by mouth every 6 (six) hours as needed for mild pain or headache.      benazepril (LOTENSIN) 10 MG tablet Take 1 tablet (10 mg total) by mouth daily. 30 tablet 2    hydrALAZINE (APRESOLINE) 25 MG tablet Take 1 tablet (25 mg total) by mouth 3 (three) times daily as needed. If systolic is greater than 180, or diastolic greater than 110 90 tablet 0    risperiDONE (RISPERDAL) 3 MG tablet Take 1 tablet (3 mg total) by mouth 2 (two) times daily. 60 tablet 2    traZODone (DESYREL) 50 MG tablet Take 1 tablet (50mg ) by mouth daily AT BEDTIME for Insomnia 15 tablet 1     Patient Stressors:    Patient Strengths:    Treatment Modalities: Medication Management, Group therapy, Case management,  1 to 1 session with clinician, Psychoeducation,  Recreational therapy.   Physician Treatment Plan for Primary Diagnosis: Paranoid schizophrenia (HCC) Long Term Goal(s): Improvement in symptoms so as ready for discharge   Short Term Goals: Ability to identify changes in lifestyle to reduce recurrence of condition will improve Ability to verbalize feelings will improve Ability to disclose and discuss suicidal ideas Ability to demonstrate self-control will improve Ability to identify and develop effective coping behaviors will improve Ability to maintain clinical measurements within normal limits will improve Compliance with prescribed medications will improve Ability to identify triggers associated with substance abuse/mental health issues will improve  Medication Management: Evaluate patient's response, side effects, and tolerance of medication regimen.  Therapeutic Interventions: 1 to 1 sessions, Unit Group sessions and Medication administration.  Evaluation of Outcomes:  Progressing  Physician Treatment Plan for Secondary Diagnosis: Principal Problem:   Paranoid schizophrenia (HCC)  Long Term Goal(s): Improvement in symptoms so as ready for discharge   Short Term Goals: Ability to identify changes in lifestyle to reduce recurrence of condition will improve Ability to verbalize feelings will improve Ability to disclose and discuss suicidal ideas Ability to demonstrate self-control will improve Ability to identify and develop effective coping behaviors will improve Ability to maintain clinical measurements within normal limits will improve Compliance with prescribed medications will improve Ability to identify triggers associated with substance abuse/mental health issues will improve     Medication Management: Evaluate patient's response, side effects, and tolerance of medication regimen.  Therapeutic Interventions: 1 to 1 sessions, Unit Group sessions and Medication administration.  Evaluation of Outcomes: Progressing   RN Treatment Plan for Primary Diagnosis: Paranoid schizophrenia (HCC) Long Term Goal(s): Knowledge of disease and therapeutic regimen to maintain health will improve  Short Term Goals: Ability to remain free from injury will improve, Ability to verbalize feelings will improve, Ability to disclose and discuss suicidal ideas, Ability to identify and develop effective coping behaviors will improve, and Compliance with prescribed medications will improve  Medication Management: RN will administer medications as ordered by provider, will assess and evaluate patient's response and provide education to patient for prescribed medication. RN will report any adverse and/or side effects to prescribing provider.  Therapeutic Interventions: 1 on 1 counseling sessions, Psychoeducation, Medication administration, Evaluate responses to treatment, Monitor vital signs and CBGs as ordered, Perform/monitor CIWA, COWS, AIMS and Fall Risk screenings as  ordered, Perform wound care treatments as ordered.  Evaluation of Outcomes: Progressing   LCSW Treatment Plan for Primary Diagnosis: Paranoid schizophrenia (HCC) Long Term Goal(s): Safe transition to appropriate next level of care at discharge, Engage patient in therapeutic group addressing interpersonal concerns.  Short Term Goals: Engage patient in aftercare planning with referrals and resources, Increase ability to appropriately verbalize feelings, Facilitate acceptance of mental health diagnosis and concerns, Identify triggers associated with mental health/substance abuse issues, and Increase skills for wellness and recovery  Therapeutic Interventions: Assess for all discharge needs, 1 to 1 time with Social worker, Explore available resources and support systems, Assess for adequacy in community support network, Educate family and significant other(s) on suicide prevention, Complete Psychosocial Assessment, Interpersonal group therapy.  Evaluation of Outcomes: Progressing   Progress in Treatment: Attending groups: Yes. and No. Participating in groups: Yes. Taking medication as prescribed: Yes. Toleration medication: Yes. Family/Significant other contact made: No, will contact:  girlfriend, [name]. Patient understands diagnosis: Yes. Discussing patient identified problems/goals with staff: Yes. Medical problems stabilized or resolved: Yes. Denies suicidal/homicidal ideation: Yes. Issues/concerns per patient self-inventory: No. Other: none  New problem(s) identified: No, Describe:  none identified.  New Short Term/Long Term Goal(s): elimination of symptoms of psychosis, medication management for mood stabilization; elimination of SI thoughts; development of comprehensive mental wellness plan.   Patient Goals:  "Stay on my medication. Get the voices away from my head." Pt expressed interested in continued outpatient treatment post discharge.  Discharge Plan or Barriers: CSW will  assist pt with an appropriate aftercare/discharge plan.    Reason for Continuation of Hospitalization: Hallucinations Medication stabilization Suicidal ideation  Estimated Length of Stay: 1-7 days  Last 3 [place] Suicide Severity Risk Score: Flowsheet Row Admission (Current) from 09/10/2023 in [facility] Most recent reading at 09/11/2023  4:00 AM ED from 09/10/2023 in [facility] Most recent reading at 09/10/2023  6:26 PM ED from 09/10/2023 in [facility] Emergency Department at [facility] Most recent reading at 09/10/2023 10:28 AM  C-SSRS RISK CATEGORY Low Risk Low Risk Low Risk       Last PHQ 2/9 Scores:    01/13/2023    9:19 AM  Depression screen PHQ 2/9  Decreased Interest 2  Down, Depressed, Hopeless 3  PHQ - 2 Score 5  Altered sleeping 3  Tired, decreased energy 3  Change in appetite 3  Feeling bad or failure about yourself  2  Trouble concentrating 2  Moving slowly or fidgety/restless 3  Suicidal thoughts 0  PHQ-9 Score 21  Difficult doing work/chores Extremely dIfficult    Scribe for Treatment Team: Glenis Smoker, LCSW 09/12/2023 10:16 AM

## 2023-09-12 NOTE — Plan of Care (Signed)
  Problem: Education: Goal: Emotional status will improve Outcome: Progressing Goal: Mental status will improve 09/12/2023 2332 by Weldon Picking, RN Outcome: Progressing 09/12/2023 2305 by Weldon Picking, RN Outcome: Progressing Goal: Verbalization of understanding the information provided will improve Outcome: Progressing   Problem: Activity: Goal: Interest or engagement in activities will improve Outcome: Progressing Goal: Sleeping patterns will improve Outcome: Progressing   Problem: Safety: Goal: Periods of time without injury will increase Outcome: Progressing

## 2023-09-12 NOTE — Plan of Care (Signed)
  Problem: Education: Goal: Mental status will improve Outcome: Progressing Goal: Verbalization of understanding the information provided will improve Outcome: Progressing   Problem: Activity: Goal: Interest or engagement in activities will improve Outcome: Progressing   Problem: Safety: Goal: Periods of time without injury will increase Outcome: Progressing

## 2023-09-12 NOTE — Progress Notes (Signed)
NUTRITION ASSESSMENT  Pt identified as at risk on the Malnutrition Screen Tool  INTERVENTION:  -Continue regular diet -MVI with minerals daily -Continue Ensure Enlive po BID, each supplement provides 350 kcal and 20 grams of protein.   NUTRITION DIAGNOSIS: Unintentional weight loss related to sub-optimal intake as evidenced by pt report.   Goal: Pt to meet >/= 90% of their estimated nutrition needs.  Monitor:  PO intake  Assessment:  Pt admitted with paranoid schizophrenia.    Per H&P, pt missed a dose of risperidal PTA and was unsure if he should double up for make up for the missed dose and started experiencing auditory hallucinations.   Pt has not been participating in group sessions.   Pt on a regular diet. No meal completion data available to assess at this time.   Reviewed wt hx; pt has experienced a 10.3% wt loss over the past year, which is not significant for time frame. Pt would greatly benefit from addition of oral nutrition supplements. Pt is at risk for malnutrition, however, unable to identify at this time.   Medications reviewed.   Labs reviewed: CBGS: 109 (inpatient orders for glycemic control are none).  Tox screen positive for marijuana.   57 y.o. male  Height: Ht Readings from Last 1 Encounters:  09/10/23 5\' 11"  (1.803 m)    Weight: Wt Readings from Last 1 Encounters:  09/11/23 78.5 kg    Weight Hx: Wt Readings from Last 10 Encounters:  09/11/23 78.5 kg  09/10/23 83 kg  06/12/23 81.6 kg  09/03/22 87.5 kg  07/26/22 88.5 kg  09/15/21 86.2 kg  04/18/21 84.4 kg  12/18/18 84.4 kg  09/22/18 83.9 kg  10/12/17 81 kg    BMI:  Body mass index is 24.13 kg/m. BMI WDL.   Estimated Nutritional Needs: Kcal: 25-30 kcal/kg Protein: > 1 gram protein/kg Fluid: 1 ml/kcal  Diet Order:  Diet Order             Diet regular Room service appropriate? Yes; Fluid consistency: Thin  Diet effective now                  Pt is also offered choice  of unit snacks mid-morning and mid-afternoon.  Pt is eating as desired.   Lab results and medications reviewed.   Levada Schilling, RD, LDN, CDCES Registered Dietitian III Certified Diabetes Care and Education Specialist Please refer to [facility] for RD and/or RD on-call/weekend/after hours pager

## 2023-09-12 NOTE — Progress Notes (Signed)
   09/12/23 1000  Psych Admission Type (Psych Patients Only)  Admission Status Voluntary  Psychosocial Assessment  Patient Complaints None  Eye Contact Fair  Facial Expression Animated  Affect Appropriate to circumstance  Speech Logical/coherent  Interaction Assertive  Motor Activity Slow  Appearance/Hygiene In scrubs  Behavior Characteristics Appropriate to situation  Mood Pleasant  Thought Process  Coherency WDL  Content WDL  Delusions None reported or observed  Perception WDL  Hallucination None reported or observed  Judgment Impaired  Confusion None  Danger to Self  Current suicidal ideation? Denies  Agreement Not to Harm Self Yes  Description of Agreement verbal  Danger to Others  Danger to Others None reported or observed

## 2023-09-12 NOTE — Consult Note (Signed)
Initial Consultation Note   Patient: Damon [name] ZOX:[id number] DOB: Feb 02, 1966 PCP: Raymon Mutton., FNP DOA: 09/10/2023 DOS: the patient was seen and examined on 09/12/2023 Primary service: Sarina Ill,*  Referring physician: Keith Rake NP  Reason for consult: HTN   Assessment/Plan: Assessment and Plan: * Paranoid schizophrenia (HCC) Management per primary team   HTN (hypertension) BP 140s to 160s over 90s to 100s On Lotensin 10 mg as well as 25 mg of hydralazine as needed Will add on Norvasc 5 mg daily Increase Lotensin from 10 mg to 20 mg Continue with as needed hydralazine Otherwise monitor        TRH will sign off at present, please call us again when needed.  HPI: Damon [name] is a 57 y.o. male with past medical history of hypertension, depression, schizophrenia who is currently admitted on the psychiatric unit for schizophrenia.  Requested for consult for hypertension.  Per report, blood pressures have been in the 140s to 160 over 90s to 100s.  Patient reports prior history of hypertension on Lotensin and unknown fluid pill (?  Lasix versus HCTZ).  No chest pain or shortness of breath.  No nausea or vomiting.  Minimal headaches.  Predominant she has been auditory hallucinations.  Improved.  No focal hemiparesis or confusion.  Denies any prior history of coronary disease, heart failure. Currently on behavioral health unit, afebrile, blood pressure 140s to 160s over 90s to 100s.  Satting well on room air.  TSH and lipid panel grossly stable apart from triglycerides at 276.  A1c 5.7.  Review of Systems: As mentioned in the history of present illness. All other systems reviewed and are negative. Past Medical History:  Diagnosis Date   Depression    Hypertension    Paranoid schizophrenia (HCC)    Schizophrenia (HCC)    No past surgical history on file. Social History:  reports that he has never smoked. He has never used smokeless tobacco. He  reports current alcohol use of about 1.0 standard drink of alcohol per week. He reports that he does not currently use drugs after having used the following drugs: Marijuana.  Allergies  Allergen Reactions   Spinach Hives   Zofran [Ondansetron] Rash    Family History  Problem Relation Age of Onset   Colon cancer Mother    Hyperlipidemia Father    Heart failure Brother    Diabetes Other    Stroke Other     Prior to Admission medications   Medication Sig Start Date End Date Taking? Authorizing Provider  acetaminophen (TYLENOL) 325 MG tablet Take 650 mg by mouth every 6 (six) hours as needed for mild pain or headache.    [provider]  benazepril (LOTENSIN) 10 MG tablet Take 1 tablet (10 mg total) by mouth daily. 09/10/23   Prosperi, Christian H, PA-C  hydrALAZINE (APRESOLINE) 25 MG tablet Take 1 tablet (25 mg total) by mouth 3 (three) times daily as needed. If systolic is greater than 180, or diastolic greater than 110 09/10/23   Prosperi, Christian H, PA-C  risperiDONE (RISPERDAL) 3 MG tablet Take 1 tablet (3 mg total) by mouth 2 (two) times daily. 09/10/23   Prosperi, Christian H, PA-C  traZODone (DESYREL) 50 MG tablet Take 1 tablet (50mg ) by mouth daily AT BEDTIME for Insomnia 11/12/22       Physical Exam: Vitals:   09/11/23 0103 09/11/23 0853 09/11/23 1547 09/12/23 0610  BP: (!) 142/92 (!) 164/96 (!) 144/90 (!) 151/104  Pulse:  67 82 89 (!) 101  Resp:  16 16 17   Temp:  98.6 F (37 C) (!) 97.5 F (36.4 C) 98.6 F (37 C)  TempSrc:  Oral    SpO2:  100% 100% 100%  Weight:       Physical Exam Constitutional:      Appearance: He is normal weight.  HENT:     Head: Normocephalic and atraumatic.     Nose: Nose normal.     Mouth/Throat:     Mouth: Mucous membranes are moist.  Eyes:     Pupils: Pupils are equal, round, and reactive to light.  Cardiovascular:     Rate and Rhythm: Normal rate and regular rhythm.  Pulmonary:     Effort: Pulmonary effort is normal.   Abdominal:     General: Bowel sounds are normal.  Musculoskeletal:        General: Normal range of motion.  Skin:    General: Skin is warm.  Neurological:     General: No focal deficit present.  Psychiatric:        Mood and Affect: Mood normal.     Data Reviewed:   There are no new results to review at this time.    Family Communication: No family present Primary team communication: Plan of care discussed w/ Keith Rake  Thank you very much for involving [place] in the care of your patient.  Author: Floydene Flock, MD 09/12/2023 4:42 PM  For on call review [email or web address].

## 2023-09-12 NOTE — Plan of Care (Signed)

## 2023-09-12 NOTE — Group Note (Signed)
[facility] LCSW Group Therapy Note    Group Date: 09/12/2023 Start Time: 1330 End Time: 1430  Type of Therapy and Topic:  Group Therapy:  Overcoming Obstacles  Participation Level:  [facility] PARTICIPATION LEVEL: Active  Mood:  Description of Group:   In this group patients will be encouraged to explore what they see as obstacles to their own wellness and recovery. They will be guided to discuss their thoughts, feelings, and behaviors related to these obstacles. The group will process together ways to cope with barriers, with attention given to specific choices patients can make. Each patient will be challenged to identify changes they are motivated to make in order to overcome their obstacles. This group will be process-oriented, with patients participating in exploration of their own experiences as well as giving and receiving support and challenge from other group members.  Therapeutic Goals: 1. Patient will identify personal and current obstacles as they relate to admission. 2. Patient will identify barriers that currently interfere with their wellness or overcoming obstacles.  3. Patient will identify feelings, thought process and behaviors related to these barriers. 4. Patient will identify two changes they are willing to make to overcome these obstacles:    Summary of Patient Progress Patient was present in group.  Patient was an active participant in group.  Patient was supportive of other group members.  Patient shared how homelessness can be an obstacle that someone may have to overcome.  Patient was appropriate and clear in group.  Patient displayed fair insight.     Therapeutic Modalities:   Cognitive Behavioral Therapy Solution Focused Therapy Motivational Interviewing Relapse Prevention Therapy   Harden Mo, LCSW

## 2023-09-12 NOTE — Group Note (Signed)
Recreation Therapy Group Note   Group Topic:Goal Setting  Group Date: 09/12/2023 Start Time: 1015 End Time: 1115 Facilitators: Clinton Gallant, CTRS Location:  Craft Room  Group Description: Vision Boards. Patients were given many different magazines, a glue stick, markers, and a piece of cardstock paper. LRT and pts discussed the importance of having goals in life. LRT and pts discussed the difference between short-term and long-term goals, as well as what a SMART goal is. LRT encouraged pts to create a vision board, with images they picked and then cut out with safety scissors from the magazine, for themselves, that capture their short and long-term goals. LRT encouraged pts to show and explain their vision board to the group.   Goal Area(s) Addressed:  Patient will gain knowledge of short vs. long term goals.  Patient will identify goals for themselves. Patient will practice setting SMART goals. Patient will verbalize their goals to LRT and peers.   Affect/Mood: N/A   Participation Level: Did not attend    Clinical Observations/Individualized Feedback: Damon [name] did not attend group.  Plan: Continue to engage patient in RT group sessions 2-3x/week.   Rosina Lowenstein, LRT, CTRS 09/12/2023 11:35 AM

## 2023-09-12 NOTE — Progress Notes (Signed)
Patient is pleasant and cooperative. Complains of anxiety. Prn given. Declined sleep meds, states he is sleeping ok. Awaiting prn effectiveness. Patient remains safe on unit with q 15 min checks.

## 2023-09-12 NOTE — Group Note (Signed)
Date:  09/12/2023 Time:  9:52 AM  Group Topic/Focus:  Goals Group:   The focus of this group is to help patients establish daily goals to achieve during treatment and discuss how the patient can incorporate goal setting into their daily lives to aide in recovery.    Participation Level:  Active  Participation Quality:  Appropriate  Affect:  Appropriate  Cognitive:  Appropriate  Insight: Appropriate  Engagement in Group:  Engaged  Modes of Intervention:  Discussion, Education, and Support  Additional Comments:    Wilford Corner 09/12/2023, 9:52 AM

## 2023-09-12 NOTE — Assessment & Plan Note (Signed)
Management per primary team. °

## 2023-09-12 NOTE — Assessment & Plan Note (Signed)
BP 140s to 160s over 90s to 100s On Lotensin 10 mg as well as 25 mg of hydralazine as needed Will add on Norvasc 5 mg daily Increase Lotensin from 10 mg to 20 mg Continue with as needed hydralazine Otherwise monitor

## 2023-09-12 NOTE — Progress Notes (Addendum)
[facility] MD Progress Note  09/12/2023 2:11 PM Damon [name]  MRN:  [id number] Subjective:  The patient stated during the treatment team meeting, the patient expressed a positive outlook, stating, "I am better and want to get better." This demonstrates his motivation for recovery and his commitment to engaging in treatment to manage his symptoms effectively. His willingness to improve aligns with the treatment goals and reinforces the importance of structured outpatient care and medication adherence to support long-term stability.Despite intermittent suicidal ideation and paranoia, he demonstrates insight into his need for treatment and a desire to stabilize. He requires close monitoring and a structured treatment plan to address medication adherence, improve symptom management, and facilitate safe discharge with outpatient care Principal Problem: Paranoid schizophrenia (HCC) Diagnosis: Principal Problem:   Paranoid schizophrenia (HCC)  Total Time spent with patient: 1 hour  Past Psychiatric History: Schizophrenia  Past Medical History:  Past Medical History:  Diagnosis Date   Depression    Hypertension    Paranoid schizophrenia (HCC)    Schizophrenia (HCC)    No past surgical history on file. Family History:  Family History  Problem Relation Age of Onset   Colon cancer Mother    Hyperlipidemia Father    Heart failure Brother    Diabetes Other    Stroke Other    Family Psychiatric  History: see above Social History:  Social History   Substance and Sexual Activity  Alcohol Use Yes   Alcohol/week: 1.0 standard drink of alcohol   Types: 1 Cans of beer per week   Comment: one can of beer weekly     Social History   Substance and Sexual Activity  Drug Use Not Currently   Types: Marijuana    Social History   Socioeconomic History   Marital status: Single    Spouse name: Not on file   Number of children: Not on file   Years of education: Not on file   Highest education  level: Not on file  Occupational History   Not on file  Tobacco Use   Smoking status: Never   Smokeless tobacco: Never  Substance and Sexual Activity   Alcohol use: Yes    Alcohol/week: 1.0 standard drink of alcohol    Types: 1 Cans of beer per week    Comment: one can of beer weekly   Drug use: Not Currently    Types: Marijuana   Sexual activity: Yes    Birth control/protection: Condom  Other Topics Concern   Not on file  Social History Narrative   ** Merged History Encounter **       Social Determinants of Health   Financial Resource Strain: Low Risk  (01/13/2023)   Overall Financial Resource Strain (CARDIA)    Difficulty of Paying Living Expenses: Not hard at all  Food Insecurity: Patient Unable To Answer (09/11/2023)   Hunger Vital Sign    Worried About Running Out of Food in the Last Year: Patient unable to answer    Ran Out of Food in the Last Year: Patient unable to answer  Transportation Needs: Patient Unable To Answer (09/11/2023)   PRAPARE - Transportation    Lack of Transportation (Medical): Patient unable to answer    Lack of Transportation (Non-Medical): Patient unable to answer  Physical Activity: Sufficiently Active (01/13/2023)   Exercise Vital Sign    Days of Exercise per Week: 5 days    Minutes of Exercise per Session: 60 min  Stress: Stress Concern Present (01/13/2023)   [place]  [employer] - Occupational Stress Questionnaire    Feeling of Stress : To some extent  Social Connections: Not on File (07/30/2023)   Received from [facility]   Social Connections    Connectedness: 0   Additional Social History:                         Sleep: Good  Appetite:  Good  Current Medications: Current Facility-Administered Medications  Medication Dose Route Frequency Provider Last Rate Last Admin   acetaminophen (TYLENOL) tablet 650 mg  650 mg Oral Q6H PRN Ardis Hughs, NP       alum & mag hydroxide-simeth (MAALOX/MYLANTA)  200-200-20 MG/5ML suspension 30 mL  30 mL Oral Q4H PRN Ardis Hughs, NP       benazepril (LOTENSIN) tablet 10 mg  10 mg Oral Daily Ardis Hughs, NP   10 mg at 09/12/23 1914   diphenhydrAMINE (BENADRYL) capsule 50 mg  50 mg Oral TID PRN Ardis Hughs, NP       Or   diphenhydrAMINE (BENADRYL) injection 50 mg  50 mg Intramuscular TID PRN Ardis Hughs, NP       feeding supplement (ENSURE ENLIVE / ENSURE PLUS) liquid 237 mL  237 mL Oral BID BM Sarina Ill, DO   237 mL at 09/12/23 7829   haloperidol (HALDOL) tablet 5 mg  5 mg Oral TID PRN Ardis Hughs, NP       Or   haloperidol lactate (HALDOL) injection 5 mg  5 mg Intramuscular TID PRN Ardis Hughs, NP       hydrALAZINE (APRESOLINE) tablet 25 mg  25 mg Oral TID PRN Ardis Hughs, NP       hydrOXYzine (ATARAX) tablet 25 mg  25 mg Oral TID PRN Ardis Hughs, NP       LORazepam (ATIVAN) tablet 2 mg  2 mg Oral TID PRN Ardis Hughs, NP       Or   LORazepam (ATIVAN) injection 2 mg  2 mg Intramuscular TID PRN Ardis Hughs, NP       magnesium hydroxide (MILK OF MAGNESIA) suspension 30 mL  30 mL Oral Daily PRN Ardis Hughs, NP       nicotine (NICODERM CQ - dosed in mg/24 hours) patch 21 mg  21 mg Transdermal Daily Ardis Hughs, NP   21 mg at 09/12/23 5621   risperiDONE (RISPERDAL) tablet 3 mg  3 mg Oral BID Ardis Hughs, NP   3 mg at 09/12/23 3086   traZODone (DESYREL) tablet 50 mg  50 mg Oral QHS PRN Ardis Hughs, NP   50 mg at 09/11/23 2126    Lab Results:  Results for orders placed or performed during the hospital encounter of 09/10/23 (from the past 48 hour(s))  Hemoglobin A1c     Status: Abnormal   Collection Time: 09/11/23  3:17 PM  Result Value Ref Range   Hgb A1c MFr Bld 5.7 (H) 4.8 - 5.6 %    Comment: (NOTE) Pre diabetes:          5.7%-6.4%  Diabetes:              >6.4%  Glycemic control for   <7.0% adults with diabetes    Mean Plasma Glucose  116.89 mg/dL    Comment: Performed at [facility], [address], [place], [place] [address]  TSH  Status: None   Collection Time: 09/11/23  3:17 PM  Result Value Ref Range   TSH 1.375 0.350 - 4.500 uIU/mL    Comment: Performed by a 3rd Generation assay with a functional sensitivity of <=0.01 uIU/mL. Performed at [facility], [address] [place], [place] [address]   Lipid panel     Status: Abnormal   Collection Time: 09/11/23  3:17 PM  Result Value Ref Range   Cholesterol 197 0 - 200 mg/dL   Triglycerides 956 (H) <150 mg/dL   HDL 83 >21 mg/dL   Total CHOL/HDL Ratio 2.4 RATIO   VLDL 55 (H) 0 - 40 mg/dL   LDL Cholesterol 59 0 - 99 mg/dL    Comment:        Total Cholesterol/HDL:CHD Risk Coronary Heart Disease Risk Table                     Men   Women  1/2 Average Risk   3.4   3.3  Average Risk       5.0   4.4  2 X Average Risk   9.6   7.1  3 X Average Risk  23.4   11.0        Use the calculated Patient Ratio above and the CHD Risk Table to determine the patient's CHD Risk.        ATP III CLASSIFICATION (LDL):  <100     mg/dL   Optimal  308-657  mg/dL   Near or Above                    Optimal  130-159  mg/dL   Borderline  846-962  mg/dL   High  >952     mg/dL   Very High Performed at [facility], [address] [place], [place] [address]     Blood Alcohol level:  Lab Results  Component Value Date   [facility] <10 09/10/2023   ETH <10 06/12/2023    Metabolic Disorder Labs: Lab Results  Component Value Date   HGBA1C 5.7 (H) 09/11/2023   MPG 116.89 09/11/2023   MPG 125.5 10/13/2017   Lab Results  Component Value Date   PROLACTIN 55.0 (H) 10/13/2017   Lab Results  Component Value Date   CHOL 197 09/11/2023   TRIG 276 (H) 09/11/2023   HDL 83 09/11/2023   CHOLHDL 2.4 09/11/2023   VLDL 55 (H) 09/11/2023   LDLCALC 59 09/11/2023   LDLCALC 90 10/31/2022    Musculoskeletal: Strength & Muscle Tone: within normal limits Gait &  Station: normal Patient leans: N/A  Psychiatric Specialty Exam:  Presentation  General Appearance:  Appropriate for Environment  Eye Contact: Good  Speech: Clear and Coherent  Speech Volume: Normal  Handedness: Right   Mood and Affect  Mood: Euthymic (during treatment team he interacted and reports feeling "alot better")  Affect: Appropriate   Thought Process  Thought Processes: Coherent  Descriptions of Associations:Intact  Orientation:Full (Time, Place and Person) (and situation)  Thought Content:WDL  History of Schizophrenia/Schizoaffective disorder:Yes  Duration of Psychotic Symptoms:Less than six months  Hallucinations:Hallucinations: None Description of Auditory Hallucinations: none  Ideas of Reference:None  Suicidal Thoughts:Suicidal Thoughts: No SI Active Intent and/or Plan: -- (none noted)  Homicidal Thoughts:No data recorded  Sensorium  Memory: Immediate Good; Remote Good  Judgment: Fair  Insight: Fair   Executive Functions  Concentration: Good  Attention Span: Good  Recall: Good  Fund of Knowledge: Good  Language:  Good   Psychomotor Activity  Psychomotor Activity:Psychomotor Activity: Normal   Assets  Assets: Communication Skills; Financial Resources/Insurance; Housing; Social Support (reports having a place to live)   Sleep  Sleep:Sleep: Good Number of Hours of Sleep: 8    Physical Exam: Physical Exam Vitals and nursing note reviewed.  Constitutional:      Appearance: Normal appearance.  HENT:     Head: Normocephalic and atraumatic.     Nose: Nose normal.  Pulmonary:     Effort: Pulmonary effort is normal.  Musculoskeletal:        General: Normal range of motion.     Cervical back: Normal range of motion.  Neurological:     General: No focal deficit present.     Mental Status: He is alert and oriented to person, place, and time.  Psychiatric:        Attention and Perception: Attention and  perception normal.        Mood and Affect: Affect normal. Mood is anxious.        Speech: Speech normal.        Behavior: Behavior normal. Behavior is cooperative.        Thought Content: Thought content normal.        Cognition and Memory: Cognition and memory normal.        Judgment: Judgment normal.    Review of Systems  Psychiatric/Behavioral:  The patient is nervous/anxious.   All other systems reviewed and are negative.  Blood pressure (!) 151/104, pulse (!) 101, temperature 98.6 F (37 C), resp. rate 17, weight 78.5 kg, SpO2 100%. Body mass index is 24.13 kg/m.   Treatment Plan Summary: Daily contact with patient to assess and evaluate symptoms and progress in treatment and Medication management Continue Risperdal 3 mg BID for psychotic symptoms. Continue Trazodone for sleep support, as needed. Reinforce adherence to antihypertensive medications and monitor blood pressure regularly Contact the medical hospitalist to review and manage the patient's current blood pressure medication regimen, given his history of poorly controlled hypertension. Collaborate with an LCSW to connect the patient  for ongoing support related to PTSD and paranoid schizophrenia.  Myriam Forehand, NP 09/12/2023, 2:11 PM

## 2023-09-12 NOTE — Group Note (Signed)
Date:  09/12/2023 Time:  6:08 PM  Group Topic/Focus:  Outdoor Structured Activity Group    Participation Level:  Active  Participation Quality:  Appropriate  Affect:  Appropriate  Cognitive:  Appropriate  Insight: Appropriate  Engagement in Group:  Engaged  Modes of Intervention:  Activity  Additional Comments:    Damon [name] 09/12/2023, 6:08 PM

## 2023-09-13 DIAGNOSIS — F200 Paranoid schizophrenia: Secondary | ICD-10-CM | POA: Diagnosis not present

## 2023-09-13 NOTE — [facility] Counselor (Signed)
CSW spoke with the patient's wife.   She reports that patient was admitted for "hearing voices in his head and thoughts of hurting others".  She was not sure who the "others" in question were.  She reports that she doesn't believe that the patient would do anything to harm others.  She denies access to weapons.  She reports that patient is not a danger to self or others.    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   No concern noted.  Penni Homans, MSW, LCSW 09/13/2023 12:28 PM

## 2023-09-13 NOTE — [facility] Suicide Risk Assessment (Signed)
[facility] INPATIENT:  Family/Significant Other Suicide Prevention Education  Suicide Prevention Education:  Contact Attempts: Calin Rominger, girlfriend, [phone] has been identified by the patient as the family member/significant other with whom the patient will be residing, and identified as the person(s) who will aid the patient in the event of a mental health crisis.  With written consent from the patient, two attempts were made to provide suicide prevention education, prior to and/or following the patient's discharge.  We were unsuccessful in providing suicide prevention education.  A suicide education pamphlet was given to the patient to share with family/significant other.  Date and time of first attempt:09/13/2023 at 10:06AM Date and time of second attempt: Second attempt is needed.  Second attempt is needed.  Harden Mo 09/13/2023, 10:05 AM

## 2023-09-13 NOTE — [facility] Suicide Risk Assessment (Signed)
[facility] INPATIENT:  Family/Significant Other Suicide Prevention Education  Suicide Prevention Education:  Education Completed; Selah Baldridge, wife, [phone] has been identified by the patient as the family member/significant other with whom the patient will be residing, and identified as the person(s) who will aid the patient in the event of a mental health crisis (suicidal ideations/suicide attempt).  With written consent from the patient, the family member/significant other has been provided the following suicide prevention education, prior to the and/or following the discharge of the patient.  The suicide prevention education provided includes the following: Suicide risk factors Suicide prevention and interventions National Suicide Hotline telephone number [facility] assessment telephone number [facility] Emergency Assistance 911 [facility] and/or Residential Mobile Crisis Unit telephone number  Request made of family/significant other to: Remove weapons (e.g., guns, rifles, knives), all items previously/currently identified as safety concern.   Remove drugs/medications (over-the-counter, prescriptions, illicit drugs), all items previously/currently identified as a safety concern.  The family member/significant other verbalizes understanding of the suicide prevention education information provided.  The family member/significant other agrees to remove the items of safety concern listed above.  Harden Mo 09/13/2023, 12:24 PM

## 2023-09-13 NOTE — Plan of Care (Signed)
Patient pleasant and cooperative on approach. Patient stated that he had a " wonderful day." Denies SI,HI and AVH. Attended groups.Appetite and energy level good. ADLs maintained. Support and encouragement given.

## 2023-09-13 NOTE — Group Note (Signed)
Date:  09/13/2023 Time:  6:06 PM  Group Topic/Focus:  Outdoor recreation structured activity    Participation Level:  Active  Participation Quality:  Appropriate  Affect:  Appropriate  Cognitive:  Alert, Appropriate, and Oriented  Insight: Appropriate  Engagement in Group:  Developing/Improving and Engaged  Modes of Intervention:  Activity  Additional Comments:    Tavone Caesar 09/13/2023, 6:06 PM

## 2023-09-13 NOTE — Group Note (Signed)
Recreation Therapy Group Note   Group Topic:Self-Esteem  Group Date: 09/13/2023 Start Time: 1010 End Time: 1105 Facilitators: Rosina Lowenstein, LRT, CTRS Location:  Craft Room  Group Description: My strengths and Qualities. Patients and LRT discussed the importance of self-love/self-esteem and things that cause it to fluctuate, including our mental health or state. Pt completed a worksheet that helps them identify 24 different strengths and qualities about themselves. Pt encouraged to read aloud at least 3 off their sheet to the group. LRT and pts discussed how this can be applied to daily life post-discharge.  Pt's then played "Positive Affirmation Bingo" afterwards, with stress balls as prizes.     Goal Area(s) Addressed: Patient will identify positive qualities about themselves. Patient will learn new positive affirmations.  Patient will recite positive qualities and affirmations aloud to the group.  Patient will practice positive self-talk.  Patient will increase communication.  Affect/Mood: Appropriate   Participation Level: Engaged   Participation Quality: Independent   Behavior: Calm and Cooperative   Speech/Thought Process: Coherent   Insight: Good   Judgement: Good   Modes of Intervention: Activity and Worksheet   Patient Response to Interventions:  Attentive and Receptive   Education Outcome:  Acknowledges education   Clinical Observations/Individualized Feedback: Zadiel was active in their participation of session activities and group discussion. Pt identified "I have helped others by listening to them, taking care of them and being supportive". Pt interacted well with LRT and peers duration of session.   Plan: Continue to engage patient in RT group sessions 2-3x/week.   Rosina Lowenstein, LRT, CTRS 09/13/2023 11:18 AM

## 2023-09-13 NOTE — Group Note (Signed)
Date:  09/13/2023 Time:  3:02 AM  Group Topic/Focus:  Wrap-Up Group:   The focus of this group is to help patients review their daily goal of treatment and discuss progress on daily workbooks.    Participation Level:  Minimal  Participation Quality:  Appropriate and Attentive  Affect:  Appropriate  Cognitive:  Alert  Insight: Appropriate  Engagement in Group:  Limited  Modes of Intervention:  Discussion  Additional Comments:     Maglione,Juliette Standre E 09/13/2023, 3:02 AM

## 2023-09-13 NOTE — Progress Notes (Signed)
[facility] MD Progress Note  09/13/2023 3:25 PM Damon [name]  MRN:  [id number] Subjective:  atient reports feeling stable and expresses satisfaction with current treatment."I am ok. The medication is working."Denies any suicidal ideation (SI), homicidal ideation (HI), hallucinations, or delusions.Currently stable, with no active psychotic symptoms. Principal Problem: Paranoid schizophrenia (HCC) Diagnosis: Principal Problem:   Paranoid schizophrenia (HCC) Active Problems:   HTN (hypertension)  Total Time spent with patient: 45 minutes  Past Psychiatric History: Depression  Past Medical History:  Past Medical History:  Diagnosis Date   Depression    Hypertension    Paranoid schizophrenia (HCC)    Schizophrenia (HCC)    No past surgical history on file. Family History:  Family History  Problem Relation Age of Onset   Colon cancer Mother    Hyperlipidemia Father    Heart failure Brother    Diabetes Other    Stroke Other    Family Psychiatric  History: see above Social History:  Social History   Substance and Sexual Activity  Alcohol Use Yes   Alcohol/week: 1.0 standard drink of alcohol   Types: 1 Cans of beer per week   Comment: one can of beer weekly     Social History   Substance and Sexual Activity  Drug Use Not Currently   Types: Marijuana    Social History   Socioeconomic History   Marital status: Single    Spouse name: Not on file   Number of children: Not on file   Years of education: Not on file   Highest education level: Not on file  Occupational History   Not on file  Tobacco Use   Smoking status: Never   Smokeless tobacco: Never  Substance and Sexual Activity   Alcohol use: Yes    Alcohol/week: 1.0 standard drink of alcohol    Types: 1 Cans of beer per week    Comment: one can of beer weekly   Drug use: Not Currently    Types: Marijuana   Sexual activity: Yes    Birth control/protection: Condom  Other Topics Concern   Not on file  Social  History Narrative   ** Merged History Encounter **       Social Determinants of Health   Financial Resource Strain: Low Risk  (01/13/2023)   Overall Financial Resource Strain (CARDIA)    Difficulty of Paying Living Expenses: Not hard at all  Food Insecurity: Patient Unable To Answer (09/11/2023)   Hunger Vital Sign    Worried About Running Out of Food in the Last Year: Patient unable to answer    Ran Out of Food in the Last Year: Patient unable to answer  Transportation Needs: Patient Unable To Answer (09/11/2023)   PRAPARE - Transportation    Lack of Transportation (Medical): Patient unable to answer    Lack of Transportation (Non-Medical): Patient unable to answer  Physical Activity: Sufficiently Active (01/13/2023)   Exercise Vital Sign    Days of Exercise per Week: 5 days    Minutes of Exercise per Session: 60 min  Stress: Stress Concern Present (01/13/2023)   [employer] of Occupational Health - Occupational Stress Questionnaire    Feeling of Stress : To some extent  Social Connections: Not on File (07/30/2023)   [facility]   Social Connections    Connectedness: 0   Additional Social History:  Sleep: Good  Appetite:  Good  Current Medications: Current Facility-Administered Medications  Medication Dose Route Frequency Provider Last Rate Last Admin   acetaminophen (TYLENOL) tablet 650 mg  650 mg Oral Q6H PRN Ardis Hughs, NP       alum & mag hydroxide-simeth (MAALOX/MYLANTA) 200-200-20 MG/5ML suspension 30 mL  30 mL Oral Q4H PRN Ardis Hughs, NP       amLODipine (NORVASC) tablet 5 mg  5 mg Oral Daily Floydene Flock, MD   5 mg at 09/13/23 0827   benazepril (LOTENSIN) tablet 20 mg  20 mg Oral Daily Floydene Flock, MD   20 mg at 09/13/23 1191   diphenhydrAMINE (BENADRYL) capsule 50 mg  50 mg Oral TID PRN Ardis Hughs, NP       Or   diphenhydrAMINE (BENADRYL) injection 50 mg  50 mg Intramuscular TID PRN  Ardis Hughs, NP       feeding supplement (ENSURE ENLIVE / ENSURE PLUS) liquid 237 mL  237 mL Oral BID BM Sarina Ill, DO   237 mL at 09/12/23 1659   haloperidol (HALDOL) tablet 5 mg  5 mg Oral TID PRN Ardis Hughs, NP       Or   haloperidol lactate (HALDOL) injection 5 mg  5 mg Intramuscular TID PRN Ardis Hughs, NP       hydrALAZINE (APRESOLINE) tablet 25 mg  25 mg Oral TID PRN Ardis Hughs, NP       hydrOXYzine (ATARAX) tablet 25 mg  25 mg Oral TID PRN Ardis Hughs, NP   25 mg at 09/12/23 2114   LORazepam (ATIVAN) tablet 2 mg  2 mg Oral TID PRN Ardis Hughs, NP       Or   LORazepam (ATIVAN) injection 2 mg  2 mg Intramuscular TID PRN Ardis Hughs, NP       magnesium hydroxide (MILK OF MAGNESIA) suspension 30 mL  30 mL Oral Daily PRN Ardis Hughs, NP       nicotine (NICODERM CQ - dosed in mg/24 hours) patch 21 mg  21 mg Transdermal Daily Vernard Gambles H, NP   21 mg at 09/13/23 0826   risperiDONE (RISPERDAL) tablet 3 mg  3 mg Oral BID Ardis Hughs, NP   3 mg at 09/13/23 0827   traZODone (DESYREL) tablet 50 mg  50 mg Oral QHS PRN Ardis Hughs, NP   50 mg at 09/11/23 2126    Lab Results: No results found for this or any previous visit (from the past 48 hour(s)).  Blood Alcohol level:  Lab Results  Component Value Date   ETH <10 09/10/2023   ETH <10 06/12/2023    Metabolic Disorder Labs: Lab Results  Component Value Date   HGBA1C 5.7 (H) 09/11/2023   MPG 116.89 09/11/2023   MPG 125.5 10/13/2017   Lab Results  Component Value Date   PROLACTIN 55.0 (H) 10/13/2017   Lab Results  Component Value Date   CHOL 197 09/11/2023   TRIG 276 (H) 09/11/2023   HDL 83 09/11/2023   CHOLHDL 2.4 09/11/2023   VLDL 55 (H) 09/11/2023   LDLCALC 59 09/11/2023   LDLCALC 90 10/31/2022    Musculoskeletal: Strength & Muscle Tone: within normal limits Gait & Station: normal Patient leans: N/A  Psychiatric Specialty  Exam:  Presentation  General Appearance:  Appropriate for Environment  Eye Contact: Good  Speech: Clear and Coherent  Speech Volume: Normal  Handedness:  Right   Mood and Affect  Mood: Euthymic  Affect: Appropriate   Thought Process  Thought Processes: Coherent  Descriptions of Associations:Intact  Orientation:Full (Time, Place and Person) (and situation)  Thought Content:WDL  History of Schizophrenia/Schizoaffective disorder:yes  Duration of Psychotic Symptoms:Less than six months  Hallucinations:Hallucinations: None Description of Auditory Hallucinations: denies at this time  Ideas of Reference:None  Suicidal Thoughts:Suicidal Thoughts: No SI Active Intent and/or Plan: -- (denies at this time)  Homicidal Thoughts:Homicidal Thoughts: No   Sensorium  Memory: Immediate Good; Remote Good  Judgment: Fair  Insight: Fair   Art therapist  Concentration: Good  Attention Span: Good  Recall: Good  Fund of Knowledge: Good  Language: Good   Psychomotor Activity  Psychomotor Activity: Psychomotor Activity: Normal   Assets  Assets: Communication Skills; Housing; Social Support; Health and safety inspector; Desire for Improvement   Sleep  Sleep: Sleep: Good Number of Hours of Sleep: 8    Physical Exam: Physical Exam HENT:     Right Ear: Tympanic membrane normal.    ROS Blood pressure (!) 153/103, pulse 92, temperature 98 F (36.7 C), resp. rate 20, weight 78.5 kg, SpO2 100%. Body mass index is 24.13 kg/m.   Treatment Plan Summary: Daily contact with patient to assess and evaluate symptoms and progress in treatment and Medication management Continue Risperdal 3 mg BID for psychotic symptoms. Continue Trazodone for sleep support, as needed. Reinforce adherence to antihypertensive medications and monitor blood pressure regularly Contact the medical hospitalist to review and manage the patient's current blood  pressure medication regimen, given his history of poorly controlled hypertension. Collaborate with an LCSW to connect the patient  for ongoing support related to PTSD and paranoid schizophrenia. Myriam Forehand, NP 09/13/2023, 3:25 PM

## 2023-09-13 NOTE — Group Note (Signed)
Date:  09/13/2023 Time:  9:06 PM  Group Topic/Focus:  Wrap-Up Group:   The focus of this group is to help patients review their daily goal of treatment and discuss progress on daily workbooks.    Participation Level:  Active  Participation Quality:  Appropriate and Attentive  Affect:  Appropriate  Cognitive:  Alert and Appropriate  Insight: Appropriate, Good, and Improving  Engagement in Group:  Developing/Improving and Engaged  Modes of Intervention:  Activity, Socialization, and Support  Additional Comments:     Beronica Lansdale 09/13/2023, 9:06 PM

## 2023-09-13 NOTE — Group Note (Signed)
Date:  09/13/2023 Time:  10:28 AM  Group Topic/Focus:  Building Self Esteem:   The Focus of this group is helping patients become aware of the effects of self-esteem on their lives, the things they and others do that enhance or undermine their self-esteem, seeing the relationship between their level of self-esteem and the choices they make and learning ways to enhance self-esteem. Emotional Education:   The focus of this group is to discuss what feelings/emotions are, and how they are experienced. Managing Feelings:   The focus of this group is to identify what feelings patients have difficulty handling and develop a plan to handle them in a healthier way upon discharge.    Participation Level:  Active  Participation Quality:  Appropriate and Attentive  Affect:  Appropriate and Excited  Cognitive:  Alert, Appropriate, and Oriented  Insight: Appropriate and Improving  Engagement in Group:  Developing/Improving, Engaged, and Supportive  Modes of Intervention:  Activity, Discussion, Education, and Socialization  Additional Comments:    Damon [name] 09/13/2023, 10:28 AM

## 2023-09-13 NOTE — Group Note (Signed)
[facility] LCSW Group Therapy Note   Group Date: 09/13/2023 Start Time: 1315 End Time: 1415  Type of Therapy/Topic:  Group Therapy:  Feelings about Diagnosis  Participation Level:  Active   Description of Group:    This group will allow patients to explore their thoughts and feelings about diagnoses they have received. Patients will be guided to explore their level of understanding and acceptance of these diagnoses. Facilitator will encourage patients to process their thoughts and feelings about the reactions of others to their diagnosis, and will guide patients in identifying ways to discuss their diagnosis with significant others in their lives. This group will be process-oriented, with patients participating in exploration of their own experiences as well as giving and receiving support and challenge from other group members.   Therapeutic Goals: 1. Patient will demonstrate understanding of diagnosis as evidence by identifying two or more symptoms of the disorder:  2. Patient will be able to express two feelings regarding the diagnosis 3. Patient will demonstrate ability to communicate their needs through discussion and/or role plays  Summary of Patient Progress: Patient was present for the majority of the group process. He was actively engaged in the discussion during his time in the room. Pt appeared open and receptive to feedback/comments from both peers and facilitator.    Therapeutic Modalities:   Cognitive Behavioral Therapy Brief Therapy Feelings Identification    Glenis Smoker, LCSW

## 2023-09-14 DIAGNOSIS — F200 Paranoid schizophrenia: Secondary | ICD-10-CM | POA: Diagnosis not present

## 2023-09-14 NOTE — Progress Notes (Signed)
Patient was pleasant on approach, he spent most of the evening resting in bed, he was cooperative with medication regime on the shift. No new behavioral issues to report on shift at this time. He denies SI, HI & AVH.

## 2023-09-14 NOTE — Plan of Care (Signed)

## 2023-09-14 NOTE — Group Note (Signed)
Date:  09/14/2023 Time:  2:10 PM  Group Topic/Focus:  Goals Group:   The focus of this group is to help patients establish daily goals to achieve during treatment and discuss how the patient can incorporate goal setting into their daily lives to aide in recovery.    Participation Level:  Active  Participation Quality:  Appropriate  Affect:  Appropriate  Cognitive:  Appropriate  Insight: Appropriate  Engagement in Group:  Engaged  Modes of Intervention:  Discussion, Education, and Support  Additional Comments:    Wilford Corner 09/14/2023, 2:10 PM

## 2023-09-14 NOTE — Group Note (Signed)
Date:  09/14/2023 Time:  6:55 PM  Group Topic/Focus:  Wellness Toolbox:   The focus of this group is to discuss various aspects of wellness, balancing those aspects and exploring ways to increase the ability to experience wellness.  Patients will create a wellness toolbox for use upon discharge.    Participation Level:  Active  Participation Quality:  Appropriate  Affect:  Appropriate  Cognitive:  Appropriate  Insight: Appropriate  Engagement in Group:  Engaged  Modes of Intervention:  Activity  Additional Comments:    Wilford Corner 09/14/2023, 6:55 PM

## 2023-09-14 NOTE — Group Note (Signed)
Recreation Therapy Group Note   Group Topic:Coping Skills  Group Date: 09/14/2023 Start Time: 1010 End Time: 1110 Facilitators: Rosina Lowenstein, LRT, CTRS Location:  Craft Room  Group Description: Mind Map.  Patient was provided a blank template of a diagram with 32 blank boxes in a tiered system, branching from the center (similar to a bubble chart). LRT directed patients to label the middle of the diagram "Coping Skills". LRT and patients then came up with 8 different coping skills as examples. Pt were directed to record their coping skills in the 2nd tier boxes closest to the center.  Patients would then share their coping skills with the group as LRT wrote them out. LRT gave a handout of 99 different coping skills at the end of group.   Goal Area(s) Addressed: Patients will be able to define "coping skills". Patient will identify new coping skills.  Patient will increase communication.   Affect/Mood: Appropriate   Participation Level: Minimal    Clinical Observations/Individualized Feedback: Eli came to group with 10 minutes remaining.   Plan: Continue to engage patient in RT group sessions 2-3x/week.   Rosina Lowenstein, LRT, CTRS 09/14/2023 11:21 AM

## 2023-09-14 NOTE — Group Note (Signed)
Date:  09/14/2023 Time:  9:02 PM  Group Topic/Focus:  Stages of Change:   The focus of this group is to explain the stages of change and help patients identify changes they want to make upon discharge.    Participation Level:  Active  Participation Quality:  Appropriate and Attentive  Affect:  Appropriate  Cognitive:  Alert and Appropriate  Insight: Appropriate and Good  Engagement in Group:  Developing/Improving and Engaged  Modes of Intervention:  Clarification, Discussion, Education, Orientation, Rapport Building, Socialization, and Support  Additional Comments:     Anabelen Kaminsky 09/14/2023, 9:02 PM

## 2023-09-14 NOTE — Progress Notes (Signed)
[facility] MD Progress Note  09/14/2023 7:00 PM Damon [name]  MRN:  [id number] Subjective:  57 year old African American male"I am feeling ok."denies dizziness, headache, suicidal ideation (SI), homicidal ideation (HI), self-injurious behavior (SIB), delusions, or hallucinations.Continue to monitor mental health status with regular assessments. Educate the client on reporting any changes in symptoms, such as dizziness, headache, or other hypertension-related symptoms. Principal Problem: Paranoid schizophrenia (HCC) Diagnosis: Principal Problem:   Paranoid schizophrenia (HCC) Active Problems:   HTN (hypertension)  Total Time spent with patient: 45 minutes  Past Psychiatric History: Depression, Paranoid Schizophernia  Past Medical History:  Past Medical History:  Diagnosis Date   Depression    Hypertension    Paranoid schizophrenia (HCC)    Schizophrenia (HCC)    No past surgical history on file. Family History:  Family History  Problem Relation Age of Onset   Colon cancer Mother    Hyperlipidemia Father    Heart failure Brother    Diabetes Other    Stroke Other    Family Psychiatric  History: see above Social History:  Social History   Substance and Sexual Activity  Alcohol Use Yes   Alcohol/week: 1.0 standard drink of alcohol   Types: 1 Cans of beer per week   Comment: one can of beer weekly     Social History   Substance and Sexual Activity  Drug Use Not Currently   Types: Marijuana    Social History   Socioeconomic History   Marital status: Single    Spouse name: Not on file   Number of children: Not on file   Years of education: Not on file   Highest education level: Not on file  Occupational History   Not on file  Tobacco Use   Smoking status: Never   Smokeless tobacco: Never  Substance and Sexual Activity   Alcohol use: Yes    Alcohol/week: 1.0 standard drink of alcohol    Types: 1 Cans of beer per week    Comment: one can of beer weekly   Drug use:  Not Currently    Types: Marijuana   Sexual activity: Yes    Birth control/protection: Condom  Other Topics Concern   Not on file  Social History Narrative   ** Merged History Encounter **       Social Determinants of Health   Financial Resource Strain: Low Risk  (01/13/2023)   Overall Financial Resource Strain (CARDIA)    Difficulty of Paying Living Expenses: Not hard at all  Food Insecurity: Patient Unable To Answer (09/11/2023)   Hunger Vital Sign    Worried About Running Out of Food in the Last Year: Patient unable to answer    Ran Out of Food in the Last Year: Patient unable to answer  Transportation Needs: Patient Unable To Answer (09/11/2023)   PRAPARE - Transportation    Lack of Transportation (Medical): Patient unable to answer    Lack of Transportation (Non-Medical): Patient unable to answer  Physical Activity: Sufficiently Active (01/13/2023)   Exercise Vital Sign    Days of Exercise per Week: 5 days    Minutes of Exercise per Session: 60 min  Stress: Stress Concern Present (01/13/2023)   [employer] of Occupational Health - Occupational Stress Questionnaire    Feeling of Stress : To some extent  Social Connections: Not on File (07/30/2023)   Received from [facility]   Social Connections    Connectedness: 0   Additional Social History:  Sleep: Good  Appetite:  Good  Current Medications: Current Facility-Administered Medications  Medication Dose Route Frequency Provider Last Rate Last Admin   acetaminophen (TYLENOL) tablet 650 mg  650 mg Oral Q6H PRN Ardis Hughs, NP       alum & mag hydroxide-simeth (MAALOX/MYLANTA) 200-200-20 MG/5ML suspension 30 mL  30 mL Oral Q4H PRN Ardis Hughs, NP       amLODipine (NORVASC) tablet 5 mg  5 mg Oral Daily Floydene Flock, MD   5 mg at 09/14/23 4098   benazepril (LOTENSIN) tablet 20 mg  20 mg Oral Daily Floydene Flock, MD   20 mg at 09/14/23 1043   diphenhydrAMINE (BENADRYL)  capsule 50 mg  50 mg Oral TID PRN Ardis Hughs, NP       Or   diphenhydrAMINE (BENADRYL) injection 50 mg  50 mg Intramuscular TID PRN Ardis Hughs, NP       feeding supplement (ENSURE ENLIVE / ENSURE PLUS) liquid 237 mL  237 mL Oral BID BM Sarina Ill, DO   237 mL at 09/14/23 1726   haloperidol (HALDOL) tablet 5 mg  5 mg Oral TID PRN Ardis Hughs, NP       Or   haloperidol lactate (HALDOL) injection 5 mg  5 mg Intramuscular TID PRN Ardis Hughs, NP       hydrALAZINE (APRESOLINE) tablet 25 mg  25 mg Oral TID PRN Ardis Hughs, NP       hydrOXYzine (ATARAX) tablet 25 mg  25 mg Oral TID PRN Ardis Hughs, NP   25 mg at 09/12/23 2114   LORazepam (ATIVAN) tablet 2 mg  2 mg Oral TID PRN Ardis Hughs, NP       Or   LORazepam (ATIVAN) injection 2 mg  2 mg Intramuscular TID PRN Ardis Hughs, NP       magnesium hydroxide (MILK OF MAGNESIA) suspension 30 mL  30 mL Oral Daily PRN Ardis Hughs, NP       nicotine (NICODERM CQ - dosed in mg/24 hours) patch 21 mg  21 mg Transdermal Daily Vernard Gambles H, NP   21 mg at 09/14/23 0817   risperiDONE (RISPERDAL) tablet 3 mg  3 mg Oral BID Ardis Hughs, NP   3 mg at 09/14/23 1725   traZODone (DESYREL) tablet 50 mg  50 mg Oral QHS PRN Ardis Hughs, NP   50 mg at 09/11/23 2126    Lab Results: No results found for this or any previous visit (from the past 48 hour(s)).  Blood Alcohol level:  Lab Results  Component Value Date   ETH <10 09/10/2023   ETH <10 06/12/2023    Metabolic Disorder Labs: Lab Results  Component Value Date   HGBA1C 5.7 (H) 09/11/2023   MPG 116.89 09/11/2023   MPG 125.5 10/13/2017   Lab Results  Component Value Date   PROLACTIN 55.0 (H) 10/13/2017   Lab Results  Component Value Date   CHOL 197 09/11/2023   TRIG 276 (H) 09/11/2023   HDL 83 09/11/2023   CHOLHDL 2.4 09/11/2023   VLDL 55 (H) 09/11/2023   LDLCALC 59 09/11/2023   LDLCALC 90 10/31/2022     Physical Findings: AIMS:  , ,  ,  ,    CIWA:  CIWA-Ar Total: 2 COWS:     Musculoskeletal: Strength & Muscle Tone: within normal limits Gait & Station: normal Patient leans: N/A  Psychiatric Specialty Exam:  Presentation  General Appearance:  Appropriate for Environment  Eye Contact: Good  Speech: Clear and Coherent  Speech Volume: Normal  Handedness: Right   Mood and Affect  Mood: Euthymic  Affect: Appropriate   Thought Process  Thought Processes: Coherent  Descriptions of Associations:Intact  Orientation:Full (Time, Place and Person) (and situation)  Thought Content:WDL  History of Schizophrenia/Schizoaffective disorder:No  Duration of Psychotic Symptoms:Less than six months  Hallucinations:Hallucinations: None Description of Auditory Hallucinations: denies at this time  Ideas of Reference:None  Suicidal Thoughts:Suicidal Thoughts: No SI Active Intent and/or Plan: -- (denies at this time)  Homicidal Thoughts:Homicidal Thoughts: No   Sensorium  Memory: Immediate Good; Remote Good  Judgment: Fair  Insight: Fair   Art therapist  Concentration: Good  Attention Span: Good  Recall: Good  Fund of Knowledge: Good  Language: Good   Psychomotor Activity  Psychomotor Activity: Psychomotor Activity: Normal   Assets  Assets: Communication Skills; Housing; Social Support; Health and safety inspector; Desire for Improvement   Sleep  Sleep: Sleep: Good Number of Hours of Sleep: 8    Physical Exam: Physical Exam Vitals and nursing note reviewed.  Constitutional:      Appearance: Normal appearance.  HENT:     Head: Normocephalic and atraumatic.     Nose: Nose normal.  Pulmonary:     Effort: Pulmonary effort is normal.  Musculoskeletal:        General: Normal range of motion.     Cervical back: Normal range of motion.  Neurological:     Mental Status: He is alert.  Psychiatric:        Attention and  Perception: Attention and perception normal.        Mood and Affect: Mood and affect normal.        Speech: Speech normal.        Behavior: Behavior normal. Behavior is cooperative.        Thought Content: Thought content normal.        Cognition and Memory: Cognition and memory normal.        Judgment: Judgment normal.    ROS Blood pressure (!) 170/96, pulse (!) 121, temperature (!) 97.5 F (36.4 C), resp. rate 17, weight 78.5 kg, SpO2 98%. Body mass index is 24.13 kg/m.   Treatment Plan Summary: Daily contact with patient to assess and evaluate symptoms and progress in treatment and Medication management Continue Risperdal 3 mg BID for psychotic symptoms. Continue Trazodone for sleep support, as needed. Reinforce adherence to antihypertensive medications and monitor blood pressure regularly Contact the medical hospitalist to review and manage the patient's current blood pressure medication regimen, given his history of poorly controlled hypertension. Collaborate with an LCSW to connect the patient  for ongoing support related to PTSD and paranoid schizophrenia. Myriam Forehand, NP 09/14/2023, 7:00 PM

## 2023-09-14 NOTE — Plan of Care (Signed)
Patient stated that he had a wonderful day. Visible in the milieu. Appropriate with staff & peers. Patient denies SI,HI and AVH. BP 170/96 . NP notified. Appetite and energy level good. Support and encouragement given.

## 2023-09-15 DIAGNOSIS — F200 Paranoid schizophrenia: Secondary | ICD-10-CM | POA: Diagnosis not present

## 2023-09-15 DIAGNOSIS — I1A0 Resistant hypertension: Secondary | ICD-10-CM

## 2023-09-15 MED ORDER — AMLODIPINE BESYLATE 5 MG PO TABS
5.0000 mg | ORAL_TABLET | Freq: Once | ORAL | Status: AC
  Administered 2023-09-15: 5 mg via ORAL
  Filled 2023-09-15: qty 1

## 2023-09-15 MED ORDER — BENAZEPRIL HCL 20 MG PO TABS
20.0000 mg | ORAL_TABLET | Freq: Once | ORAL | Status: AC
  Administered 2023-09-15: 20 mg via ORAL
  Filled 2023-09-15: qty 1

## 2023-09-15 MED ORDER — AMLODIPINE BESYLATE 5 MG PO TABS
10.0000 mg | ORAL_TABLET | Freq: Every day | ORAL | Status: DC
  Administered 2023-09-16 – 2023-09-17 (×2): 10 mg via ORAL
  Filled 2023-09-15 (×2): qty 2

## 2023-09-15 MED ORDER — BENAZEPRIL HCL 20 MG PO TABS
40.0000 mg | ORAL_TABLET | Freq: Every day | ORAL | Status: DC
  Administered 2023-09-16 – 2023-09-17 (×2): 40 mg via ORAL
  Filled 2023-09-15 (×2): qty 2

## 2023-09-15 NOTE — Progress Notes (Addendum)
[facility] MD Progress Note  09/15/2023 12:41 PM Damon [name]  MRN:  [id number] Subjective:  57 year-old [place] American male "When am I going home?"Denies suicidal ideation (SI), homicidal ideation (HI), self-injurious behavior (SIB), delusions, or hallucinations.Blood pressure remains elevated. Principal Problem: Paranoid schizophrenia (HCC) Diagnosis: Principal Problem:   Paranoid schizophrenia (HCC) Active Problems:   HTN (hypertension)  Total Time spent with patient: 1 hour  Past Psychiatric History: Schizophrenia, Depression  Past Medical History:  Past Medical History:  Diagnosis Date   Depression    Hypertension    Paranoid schizophrenia (HCC)    Schizophrenia (HCC)    No past surgical history on file. Family History:  Family History  Problem Relation Age of Onset   Colon cancer Mother    Hyperlipidemia Father    Heart failure Brother    Diabetes Other    Stroke Other    Family Psychiatric  History: see above Social History:  Social History   Substance and Sexual Activity  Alcohol Use Yes   Alcohol/week: 1.0 standard drink of alcohol   Types: 1 Cans of beer per week   Comment: one can of beer weekly     Social History   Substance and Sexual Activity  Drug Use Not Currently   Types: Marijuana    Social History   Socioeconomic History   Marital status: Single    Spouse name: Not on file   Number of children: Not on file   Years of education: Not on file   Highest education level: Not on file  Occupational History   Not on file  Tobacco Use   Smoking status: Never   Smokeless tobacco: Never  Substance and Sexual Activity   Alcohol use: Yes    Alcohol/week: 1.0 standard drink of alcohol    Types: 1 Cans of beer per week    Comment: one can of beer weekly   Drug use: Not Currently    Types: Marijuana   Sexual activity: Yes    Birth control/protection: Condom  Other Topics Concern   Not on file  Social History Narrative   ** Merged History  Encounter **       Social Determinants of Health   Financial Resource Strain: Low Risk  (01/13/2023)   Overall Financial Resource Strain (CARDIA)    Difficulty of Paying Living Expenses: Not hard at all  Food Insecurity: Patient Unable To Answer (09/11/2023)   Hunger Vital Sign    Worried About Running Out of Food in the Last Year: Patient unable to answer    Ran Out of Food in the Last Year: Patient unable to answer  Transportation Needs: Patient Unable To Answer (09/11/2023)   PRAPARE - Transportation    Lack of Transportation (Medical): Patient unable to answer    Lack of Transportation (Non-Medical): Patient unable to answer  Physical Activity: Sufficiently Active (01/13/2023)   Exercise Vital Sign    Days of Exercise per Week: 5 days    Minutes of Exercise per Session: 60 min  Stress: Stress Concern Present (01/13/2023)   [employer] of Occupational Health - Occupational Stress Questionnaire    Feeling of Stress : To some extent  Social Connections: Not on File (07/30/2023)   Received from [facility]   Social Connections    Connectedness: 0   Additional Social History:                         Sleep: Good  Appetite:  Good  Current Medications: Current Facility-Administered Medications  Medication Dose Route Frequency Provider Last Rate Last Admin   acetaminophen (TYLENOL) tablet 650 mg  650 mg Oral Q6H PRN Ardis Hughs, NP       alum & mag hydroxide-simeth (MAALOX/MYLANTA) 200-200-20 MG/5ML suspension 30 mL  30 mL Oral Q4H PRN Ardis Hughs, NP       amLODipine (NORVASC) tablet 5 mg  5 mg Oral Daily Floydene Flock, MD   5 mg at 09/15/23 4098   benazepril (LOTENSIN) tablet 20 mg  20 mg Oral Daily Floydene Flock, MD   20 mg at 09/15/23 0825   diphenhydrAMINE (BENADRYL) capsule 50 mg  50 mg Oral TID PRN Ardis Hughs, NP       Or   diphenhydrAMINE (BENADRYL) injection 50 mg  50 mg Intramuscular TID PRN Ardis Hughs, NP       feeding  supplement (ENSURE ENLIVE / ENSURE PLUS) liquid 237 mL  237 mL Oral BID BM Sarina Ill, DO   237 mL at 09/15/23 1191   haloperidol (HALDOL) tablet 5 mg  5 mg Oral TID PRN Ardis Hughs, NP       Or   haloperidol lactate (HALDOL) injection 5 mg  5 mg Intramuscular TID PRN Ardis Hughs, NP       hydrALAZINE (APRESOLINE) tablet 25 mg  25 mg Oral TID PRN Ardis Hughs, NP       hydrOXYzine (ATARAX) tablet 25 mg  25 mg Oral TID PRN Ardis Hughs, NP   25 mg at 09/12/23 2114   LORazepam (ATIVAN) tablet 2 mg  2 mg Oral TID PRN Ardis Hughs, NP       Or   LORazepam (ATIVAN) injection 2 mg  2 mg Intramuscular TID PRN Ardis Hughs, NP       magnesium hydroxide (MILK OF MAGNESIA) suspension 30 mL  30 mL Oral Daily PRN Ardis Hughs, NP       nicotine (NICODERM CQ - dosed in mg/24 hours) patch 21 mg  21 mg Transdermal Daily Vernard Gambles H, NP   21 mg at 09/15/23 4782   risperiDONE (RISPERDAL) tablet 3 mg  3 mg Oral BID Ardis Hughs, NP   3 mg at 09/15/23 9562   traZODone (DESYREL) tablet 50 mg  50 mg Oral QHS PRN Ardis Hughs, NP   50 mg at 09/14/23 2103    Lab Results: No results found for this or any previous visit (from the past 48 hour(s)).  Blood Alcohol level:  Lab Results  Component Value Date   ETH <10 09/10/2023   ETH <10 06/12/2023    Metabolic Disorder Labs: Lab Results  Component Value Date   HGBA1C 5.7 (H) 09/11/2023   MPG 116.89 09/11/2023   MPG 125.5 10/13/2017   Lab Results  Component Value Date   PROLACTIN 55.0 (H) 10/13/2017   Lab Results  Component Value Date   CHOL 197 09/11/2023   TRIG 276 (H) 09/11/2023   HDL 83 09/11/2023   CHOLHDL 2.4 09/11/2023   VLDL 55 (H) 09/11/2023   LDLCALC 59 09/11/2023   LDLCALC 90 10/31/2022    Physical Findings: AIMS:  , ,  ,  ,    CIWA:  CIWA-Ar Total: 2 COWS:     Musculoskeletal: Strength & Muscle Tone: within normal limits Gait & Station: normal Patient  leans: N/A  Psychiatric Specialty Exam:  Presentation  General Appearance:  Appropriate for  Environment  Eye Contact: Good  Speech: Clear and Coherent  Speech Volume: Normal  Handedness: Right   Mood and Affect  Mood: Euthymic  Affect: Appropriate   Thought Process  Thought Processes: Coherent  Descriptions of Associations:Intact  Orientation:Full (Time, Place and Person) (and situation)  Thought Content:WDL  History of Schizophrenia/Schizoaffective disorder:No  Duration of Psychotic Symptoms:Less than six months  Hallucinations:No data recorded Ideas of Reference:None  Suicidal Thoughts:No data recorded Homicidal Thoughts:No data recorded  Sensorium  Memory: Immediate Good; Remote Good  Judgment: Fair  Insight: Fair   Art therapist  Concentration: Good  Attention Span: Good  Recall: Good  Fund of Knowledge: Good  Language: Good   Psychomotor Activity  Psychomotor Activity:No data recorded  Assets  Assets: Communication Skills; Housing; Social Support; Health and safety inspector; Desire for Improvement   Sleep  Sleep:No data recorded   Physical Exam: Physical Exam Vitals and nursing note reviewed.  Constitutional:      Appearance: Normal appearance.  HENT:     Head: Normocephalic and atraumatic.     Nose: Nose normal.  Pulmonary:     Effort: Pulmonary effort is normal.  Musculoskeletal:        General: Normal range of motion.     Cervical back: Normal range of motion.  Neurological:     Mental Status: He is alert.  Psychiatric:        Attention and Perception: Attention and perception normal.        Mood and Affect: Mood and affect normal.        Speech: Speech normal.        Behavior: Behavior normal. Behavior is cooperative.        Thought Content: Thought content normal.        Cognition and Memory: Cognition and memory normal.        Judgment: Judgment normal.    ROS Blood pressure (!)  162/98, pulse (!) 101, temperature 98.3 F (36.8 C), temperature source Oral, resp. rate 17, weight 78.5 kg, SpO2 99%. Body mass index is 24.13 kg/m.   Treatment Plan Summary: Daily contact with patient to assess and evaluate symptoms and progress in treatment and Medication management Continue Risperdal 3 mg BID for psychotic symptoms. Continue Trazodone for sleep support, as needed. Reinforce adherence to antihypertensive medications and monitor blood pressure regularly Contact the medical hospitalist to review and manage the patient's current blood pressure medication regimen, given his history of poorly controlled hypertension. Collaborate with an LCSW to connect the patient  for ongoing support related to PTSD and paranoid schizophrenia. Myriam Forehand, NP 09/15/2023, 12:41 PM

## 2023-09-15 NOTE — Group Note (Signed)
Recreation Therapy Group Note   Group Topic:Other  Group Date: 09/15/2023 Start Time: 1000 End Time: 1100 Facilitators: Rosina Lowenstein, LRT, CTRS Location:  Craft Room  Group Description: Sport and exercise psychologist. Pts were given a card with a Halloween term on it and asked to draw it out on the dry erase board, one at a time and without using words or sound, for the group to guess what they have drawn. Once the image was successfully drawn, the next person would draw their term on the board. 2 mummies were identified voluntarily from the group. The rest of the group was split into two groups. Pts were encouraged to wrap the 2 mummies from head to toe with a roll of toilet paper. The team to do so fastest and with the whole roll of toilet paper, wins.   Goal Area(s) Addressed:  Patient will increase communication skills. Patient will engage in a team-building activity.  Patient will build frustration tolerance skills.  Patient will gain knowledge of an emotional expression activity, like drawing.    Affect/Mood: Appropriate   Participation Level: Active and Engaged   Participation Quality: Independent   Behavior: Calm and Cooperative   Speech/Thought Process: Coherent   Insight: Good   Judgement: Good   Modes of Intervention: Activity   Patient Response to Interventions:  Attentive, Engaged, Interested , and Receptive   Education Outcome:  Acknowledges education   Clinical Observations/Individualized Feedback: [name] was active in their participation of session activities and group discussion. Pt interacted well with LRT and peers duration of session.   Plan: Continue to engage patient in RT group sessions 2-3x/week.   Rosina Lowenstein, LRT, CTRS 09/15/2023 11:18 AM

## 2023-09-15 NOTE — Group Note (Signed)
Date:  09/15/2023 Time:  7:12 PM  Group Topic/Focus:  Activity Group:  The focus of the group is to encourage activity with the patients and have them go outside to the courtyard for some fresh air and some exercise.    Participation Level:  Did Not Attend   Damon [name] 09/15/2023, 7:12 PM

## 2023-09-15 NOTE — Plan of Care (Signed)
  Problem: Education: Goal: Emotional status will improve Outcome: Progressing Goal: Mental status will improve Outcome: Progressing   Problem: Activity: Goal: Interest or engagement in activities will improve Outcome: Progressing Goal: Sleeping patterns will improve Outcome: Progressing

## 2023-09-15 NOTE — Group Note (Addendum)
Date:  09/15/2023 Time:  6:30 PM  Group Topic/Focus:  Goals Group:   The focus of this group is to help patients establish daily goals to achieve during treatment and discuss how the patient can incorporate goal setting into their daily lives to aide in recovery.    Participation Level:  Active  Participation Quality:  Appropriate and Attentive  Affect:  Appropriate  Cognitive:  Appropriate  Insight: Appropriate  Engagement in Group:  Engaged  Modes of Intervention:  Discussion and Education  Additional Comments:    Jenyfer Trawick A Shea Kapur 09/15/2023, 6:30 PM

## 2023-09-15 NOTE — Progress Notes (Signed)
Patient presents with sad affect but brightens on approach. Denies SI, HI, AVH. Complains of insomnia. Prn given with good relief. Isolative to self, appropriate with staff and peers this shift. Encouragement and support provided. Safety checks maintained. Medications given as prescribed. Pt receptive and remains safe on unit with q 15 min checks.

## 2023-09-15 NOTE — Progress Notes (Signed)
  Progress Note   Patient: Damon [name] ZOX:[id number] DOB: 27-Oct-1966 DOA: 09/10/2023     5 DOS: the patient was seen and examined on 09/15/2023   Brief hospital course: No notes on file  Assessment and Plan: * Paranoid schizophrenia (HCC) Management per primary team   HTN (hypertension) Discussed with nurse practitioner incyclinide, appears that the patient blood pressure remained to be high with SBP 160-180 and DBP of 100-1 10.  For now we will increase metoprolol from 5 mg to 10 mg daily and benazepril from 20 mg to 40 mg daily.  If BP remains high, can consider HCTZ.  Hospitalist will sign off.        Subjective: Patient has been treated for high blood pressure and initiation of dual anti-BP medications including amlodipine and benazepril 3 days ago however BP remains high.  Patient has no complaints, denied any chest pain shortness of breath or headache.  Physical Exam: Vitals:   09/13/23 1650 09/14/23 0615 09/14/23 1735 09/15/23 0622  BP: (!) 159/100 (!) 155/106 (!) 170/96 (!) 162/98  Pulse: 94 95 (!) 121 (!) 101  Resp: 17 17    Temp: (!) 97.2 F (36.2 C) 97.9 F (36.6 C) (!) 97.5 F (36.4 C) 98.3 F (36.8 C)  TempSrc:    Oral  SpO2: 100% 100% 98% 99%  Weight:       Eyes: PERRL, lids and conjunctivae normal ENMT: Mucous membranes are moist. Posterior pharynx clear of any exudate or lesions.Normal dentition.  Neck: normal, supple, no masses, no thyromegaly Respiratory: clear to auscultation bilaterally, no wheezing, no crackles. Normal respiratory effort. No accessory muscle use.  Cardiovascular: Regular rate and rhythm, no murmurs / rubs / gallops. No extremity edema. 2+ pedal pulses. No carotid bruits.  Abdomen: no tenderness, no masses palpated. No hepatosplenomegaly. Bowel sounds positive.  Musculoskeletal: no clubbing / cyanosis. No joint deformity upper and lower extremities. Good ROM, no contractures. Normal muscle tone.  Skin: no rashes, lesions,  ulcers. No induration Neurologic: CN 2-12 grossly intact. Sensation intact, DTR normal.  Muscle strength 5/5 on both sides Psychiatric: Normal judgment and insight. Alert and oriented x 3. Normal mood.    Data Reviewed:  There are no new results to review at this time.  Family Communication: None at bedside      Time spent: 40 minutes  Author: Emeline General, MD 09/15/2023 3:44 PM  For on call review [email or web address].

## 2023-09-15 NOTE — Progress Notes (Signed)
   09/15/23 2200  Psych Admission Type (Psych Patients Only)  Admission Status Voluntary  Psychosocial Assessment  Patient Complaints None  Eye Contact Fair  Facial Expression Flat  Affect Appropriate to circumstance  Speech Logical/coherent  Interaction Assertive  Motor Activity Slow  Appearance/Hygiene Unremarkable  Behavior Characteristics Cooperative  Mood Pleasant  Thought Process  Coherency WDL  Content WDL  Delusions None reported or observed  Perception WDL  Hallucination None reported or observed  Judgment WDL  Confusion None  Danger to Self  Current suicidal ideation? Denies  Agreement Not to Harm Self Yes  Description of Agreement Verbal  Danger to Others  Danger to Others None reported or observed

## 2023-09-15 NOTE — Group Note (Signed)
Date:  09/15/2023 Time:  9:57 PM  Group Topic/Focus:  Making Healthy Choices:   The focus of this group is to help patients identify negative/unhealthy choices they were using prior to admission and identify positive/healthier coping strategies to replace them upon discharge.    Participation Level:  Did Not Attend  Participation Quality:   none  Affect:   none  Cognitive:   none  Insight: None  Engagement in Group:   none  Modes of Intervention:   none  Additional Comments:  none  Shiva Karis 09/15/2023, 9:57 PM

## 2023-09-15 NOTE — Plan of Care (Signed)

## 2023-09-15 NOTE — Progress Notes (Signed)
D- Patient alert and oriented x 4. Affect bright/mood congruent. Denies SI/ HI/ AVH. Patient denies pain. Patient denies depression and anxiety. He states his medication regimen is "good now". A- Scheduled medications administered to patient, per MD orders. Support and encouragement provided.  Routine safety checks conducted every 15 minutes without incident.  Patient informed to notify staff with problems or concerns and verbalizes understanding. R- No adverse drug reactions noted.  Patient compliant with medications and treatment plan. Patient receptive, calm and cooperative. He interacts well with others on the unit.  Patient contracts for safety and  remains safe on the unit at this time.

## 2023-09-16 NOTE — Progress Notes (Signed)
[facility] MD Progress Note  09/16/2023 5:33 PM Damon [name]  MRN:  [id number] Subjective:  57 year-old [place] American male "When am I going home?"Denies suicidal ideation (SI), homicidal ideation (HI), self-injurious behavior (SIB), delusions, or hallucinations.Blood pressure remains elevated.  Principal Problem: Paranoid schizophrenia (HCC) Diagnosis: Principal Problem:   Paranoid schizophrenia (HCC) Active Problems:   HTN (hypertension)  Total Time spent with patient: 1.5 hours  Past Psychiatric History: Schizophrenia  Past Medical History:  Past Medical History:  Diagnosis Date   Depression    Hypertension    Paranoid schizophrenia (HCC)    Schizophrenia (HCC)    No past surgical history on file. Family History:  Family History  Problem Relation Age of Onset   Colon cancer Mother    Hyperlipidemia Father    Heart failure Brother    Diabetes Other    Stroke Other    Family Psychiatric  History: see above Social History:  Social History   Substance and Sexual Activity  Alcohol Use Yes   Alcohol/week: 1.0 standard drink of alcohol   Types: 1 Cans of beer per week   Comment: one can of beer weekly     Social History   Substance and Sexual Activity  Drug Use Not Currently   Types: Marijuana    Social History   Socioeconomic History   Marital status: Single    Spouse name: Not on file   Number of children: Not on file   Years of education: Not on file   Highest education level: Not on file  Occupational History   Not on file  Tobacco Use   Smoking status: Never   Smokeless tobacco: Never  Substance and Sexual Activity   Alcohol use: Yes    Alcohol/week: 1.0 standard drink of alcohol    Types: 1 Cans of beer per week    Comment: one can of beer weekly   Drug use: Not Currently    Types: Marijuana   Sexual activity: Yes    Birth control/protection: Condom  Other Topics Concern   Not on file  Social History Narrative   ** Merged History Encounter **        Social Determinants of Health   Financial Resource Strain: Low Risk  (01/13/2023)   Overall Financial Resource Strain (CARDIA)    Difficulty of Paying Living Expenses: Not hard at all  Food Insecurity: Patient Unable To Answer (09/11/2023)   Hunger Vital Sign    Worried About Running Out of Food in the Last Year: Patient unable to answer    Ran Out of Food in the Last Year: Patient unable to answer  Transportation Needs: Patient Unable To Answer (09/11/2023)   PRAPARE - Transportation    Lack of Transportation (Medical): Patient unable to answer    Lack of Transportation (Non-Medical): Patient unable to answer  Physical Activity: Sufficiently Active (01/13/2023)   Exercise Vital Sign    Days of Exercise per Week: 5 days    Minutes of Exercise per Session: 60 min  Stress: Stress Concern Present (01/13/2023)   [employer] of Occupational Health - Occupational Stress Questionnaire    Feeling of Stress : To some extent  Social Connections: Not on File (07/30/2023)   Received from [facility]   Social Connections    Connectedness: 0   Additional Social History:                         Sleep: Good  Appetite:  Good  Current Medications: Current Facility-Administered Medications  Medication Dose Route Frequency Provider Last Rate Last Admin   acetaminophen (TYLENOL) tablet 650 mg  650 mg Oral Q6H PRN Ardis Hughs, NP   650 mg at 09/16/23 0712   alum & mag hydroxide-simeth (MAALOX/MYLANTA) 200-200-20 MG/5ML suspension 30 mL  30 mL Oral Q4H PRN Ardis Hughs, NP       amLODipine (NORVASC) tablet 10 mg  10 mg Oral Daily Mikey College T, MD   10 mg at 09/16/23 1014   benazepril (LOTENSIN) tablet 40 mg  40 mg Oral Daily Mikey College T, MD   40 mg at 09/16/23 1012   diphenhydrAMINE (BENADRYL) capsule 50 mg  50 mg Oral TID PRN Ardis Hughs, NP       Or   diphenhydrAMINE (BENADRYL) injection 50 mg  50 mg Intramuscular TID PRN Ardis Hughs, NP       feeding  supplement (ENSURE ENLIVE / ENSURE PLUS) liquid 237 mL  237 mL Oral BID BM Sarina Ill, DO   237 mL at 09/16/23 1447   haloperidol (HALDOL) tablet 5 mg  5 mg Oral TID PRN Ardis Hughs, NP       Or   haloperidol lactate (HALDOL) injection 5 mg  5 mg Intramuscular TID PRN Ardis Hughs, NP       hydrALAZINE (APRESOLINE) tablet 25 mg  25 mg Oral TID PRN Ardis Hughs, NP       hydrOXYzine (ATARAX) tablet 25 mg  25 mg Oral TID PRN Ardis Hughs, NP   25 mg at 09/12/23 2114   LORazepam (ATIVAN) tablet 2 mg  2 mg Oral TID PRN Ardis Hughs, NP       Or   LORazepam (ATIVAN) injection 2 mg  2 mg Intramuscular TID PRN Ardis Hughs, NP       magnesium hydroxide (MILK OF MAGNESIA) suspension 30 mL  30 mL Oral Daily PRN Ardis Hughs, NP       nicotine (NICODERM CQ - dosed in mg/24 hours) patch 21 mg  21 mg Transdermal Daily Vernard Gambles H, NP   21 mg at 09/16/23 1012   risperiDONE (RISPERDAL) tablet 3 mg  3 mg Oral BID Ardis Hughs, NP   3 mg at 09/16/23 1700   traZODone (DESYREL) tablet 50 mg  50 mg Oral QHS PRN Ardis Hughs, NP   50 mg at 09/14/23 2103    Lab Results: No results found for this or any previous visit (from the past 48 hour(s)).  Blood Alcohol level:  Lab Results  Component Value Date   ETH <10 09/10/2023   ETH <10 06/12/2023    Metabolic Disorder Labs: Lab Results  Component Value Date   HGBA1C 5.7 (H) 09/11/2023   MPG 116.89 09/11/2023   MPG 125.5 10/13/2017   Lab Results  Component Value Date   PROLACTIN 55.0 (H) 10/13/2017   Lab Results  Component Value Date   CHOL 197 09/11/2023   TRIG 276 (H) 09/11/2023   HDL 83 09/11/2023   CHOLHDL 2.4 09/11/2023   VLDL 55 (H) 09/11/2023   LDLCALC 59 09/11/2023   LDLCALC 90 10/31/2022    Physical Findings: AIMS:  , ,  ,  ,    CIWA:  CIWA-Ar Total: 2 COWS:     Musculoskeletal: Strength & Muscle Tone: within normal limits Gait & Station: normal Patient  leans: N/A  Psychiatric Specialty Exam:  Presentation  General Appearance:  Appropriate for Environment; Neat  Eye Contact: Good  Speech: Clear and Coherent; Normal Rate  Speech Volume: Normal  Handedness: Right   Mood and Affect  Mood: Anxious  Affect: Congruent; Appropriate   Thought Process  Thought Processes: Coherent  Descriptions of Associations:Intact  Orientation:Full (Time, Place and Person) (and situation)  Thought Content:WDL  History of Schizophrenia/Schizoaffective disorder:No  Duration of Psychotic Symptoms:N/A  Hallucinations:Hallucinations: None Description of Auditory Hallucinations: none noted  Ideas of Reference:None  Suicidal Thoughts:Suicidal Thoughts: No SI Active Intent and/or Plan: -- (none noted)  Homicidal Thoughts:Homicidal Thoughts: No   Sensorium  Memory: Immediate Good; Remote Good  Judgment: Good  Insight: Good   Executive Functions  Concentration: Good  Attention Span: Good  Recall: Good  Fund of Knowledge: Good  Language: Good   Psychomotor Activity  Psychomotor Activity: Psychomotor Activity: Normal   Assets  Assets: Communication Skills; Desire for Improvement; Resilience   Sleep  Sleep: Sleep: Fair Number of Hours of Sleep: 4    Physical Exam: Physical Exam Vitals and nursing note reviewed.  Constitutional:      Appearance: Normal appearance.  HENT:     Head: Normocephalic and atraumatic.     Nose: Nose normal.  Pulmonary:     Effort: Pulmonary effort is normal.  Musculoskeletal:        General: Normal range of motion.     Cervical back: Normal range of motion.  Neurological:     General: No focal deficit present.     Mental Status: He is alert and oriented to person, place, and time. Mental status is at baseline.  Psychiatric:        Attention and Perception: Attention and perception normal.        Mood and Affect: Mood and affect normal.        Speech: Speech  normal.        Behavior: Behavior is uncooperative.        Thought Content: Thought content normal.        Cognition and Memory: Cognition and memory normal.        Judgment: Judgment normal.    Review of Systems  All other systems reviewed and are negative.  Blood pressure (!) 147/103, pulse 94, temperature 98.1 F (36.7 C), resp. rate 17, weight 78.5 kg, SpO2 100%. Body mass index is 24.13 kg/m.   Treatment Plan Summary: Daily contact with patient to assess and evaluate symptoms and progress in treatment and Medication management Continue Risperdal 3 mg BID for psychotic symptoms. Continue Trazodone for sleep support, as needed. Consider discharge in the am Reinforce adherence to antihypertensive medications and monitor blood pressure regularly Contact the medical hospitalist to review and manage the patient's current blood pressure medication regimen, given his history of poorly controlled hypertension. Collaborate with an LCSW to connect the patient  for ongoing support related to PTSD and paranoid schizophrenia. Myriam Forehand, NP 09/16/2023, 5:33 PM

## 2023-09-16 NOTE — Plan of Care (Signed)
  Problem: Education: Goal: Knowledge of [facility] Education information/materials will improve Outcome: Progressing Goal: Emotional status will improve Outcome: Progressing   

## 2023-09-16 NOTE — Plan of Care (Signed)

## 2023-09-16 NOTE — Progress Notes (Signed)
   09/16/23 1200  Psych Admission Type (Psych Patients Only)  Admission Status Voluntary  Psychosocial Assessment  Patient Complaints None  Eye Contact Fair  Facial Expression Flat  Affect Appropriate to circumstance  Speech Logical/coherent  Interaction Minimal  Motor Activity Slow  Appearance/Hygiene Unremarkable  Behavior Characteristics Cooperative  Mood Pleasant  Thought Process  Coherency WDL  Content WDL  Delusions None reported or observed  Perception WDL  Hallucination None reported or observed  Judgment WDL  Confusion None  Danger to Self  Current suicidal ideation? Denies  Agreement Not to Harm Self Yes  Description of Agreement verbal  Danger to Others  Danger to Others None reported or observed

## 2023-09-16 NOTE — Group Note (Signed)
Recreation Therapy Group Note   Group Topic:Leisure Education  Group Date: 09/16/2023 Start Time: 1100 End Time: 1200 Facilitators: Rosina Lowenstein, LRT, CTRS Location:  Craft Room  Group Description: Leisure. Patients were given the option to choose from singing karaoke, coloring mandalas, using oil pastels, journaling, or playing with play-doh. LRT and pts discussed the meaning of leisure, the importance of participating in leisure during their free time/when they're outside of the hospital, as well as how our leisure interests can also serve as coping skills.    Goal Area(s) Addressed:  Patient will identify a current leisure interest.  Patient will learn the definition of "leisure". Patient will practice making a positive decision. Patient will have the opportunity to try a new leisure activity. Patient will communicate with peers and LRT.    Affect/Mood: N/A   Participation Level: Did not attend    Clinical Observations/Individualized Feedback: Damon [name] did not attend group.  Plan: Continue to engage patient in RT group sessions 2-3x/week.   Rosina Lowenstein, LRT, CTRS 09/16/2023 12:51 PM

## 2023-09-16 NOTE — Group Note (Signed)
Date:  09/16/2023 Time:  3:32 PM  Group Topic/Focus:  Activity Group:  The focus of the group is to promote activity for the patients and allow them to go outside in the courtyard for some fresh air and some exercise.    Participation Level:  Did Not Attend   Mary Sella Marlow Berenguer 09/16/2023, 3:32 PM

## 2023-09-17 MED ORDER — AMLODIPINE BESYLATE 10 MG PO TABS: 10.0000 mg | ORAL_TABLET | Freq: Every day | ORAL | 0 refills | Status: DC

## 2023-09-17 MED ORDER — BENAZEPRIL HCL 40 MG PO TABS: 40.0000 mg | ORAL_TABLET | Freq: Every day | ORAL | 0 refills | Status: DC

## 2023-09-17 NOTE — Plan of Care (Signed)

## 2023-09-17 NOTE — Group Note (Signed)
Date:  09/17/2023 Time:  12:36 PM  Group Topic/Focus:  Goals Group:   The focus of this group is to help patients establish daily goals to achieve during treatment and discuss how the patient can incorporate goal setting into their daily lives to aide in recovery.    Participation Level:  Did Not Attend  Participation Quality:    Affect:    Cognitive:    Insight:   Engagement in Group:    Modes of Intervention:    Additional Comments:    Curties Conigliaro 09/17/2023, 12:36 PM

## 2023-09-17 NOTE — Discharge Summary (Addendum)
[facility] Discharge Suicide Risk Assessment   Principal Problem: Paranoid schizophrenia [facility]) Discharge Diagnoses: Principal Problem:   Paranoid schizophrenia (HCC) Active Problems:   HTN (hypertension)   Total Time spent with patient: 15 minutes  Per HPI: "Damon [name], 57 y.o., male  has past psychiatric history of schizophrenia. he is prescribed Risperdal 3 mg twice daily.    On admission it was documented that  patient " I was hearing voices, they were scrambled, I couldn't really tell what they said."  He takes Risperdal and missed a dose, "and I got confused and didn't know if I should stop it or double it up".  Today, "I can barely hear it now, buzzing now, I can't tell what they are saying" in reference to the voices.  He received outpatient care in the past from [facility] but no outpatient at this time.  "I didn't have insurance, now I do".  He was going to the ED to get his medications.  Mild depression "now" but high depression prior to admission with suicidal ideations, none now.  "I thought about grabbing a knife and killing myself."  When asked what stopped him, "When I saw the knife in my hand, I knew I had to get help."  Past suicide attempt in 2020 where he cut himself "one and only time".  When asked what made things better, he stated, "Coming to a facility and getting my medication on track now".   Evaluation at discharge: Damon [name] 57 year old African-American male was seen and evaluated face-to-face by this provider at discharge.  He is denying any suicidal or homicidal ideations.  Denies auditory or visual hallucinations.  States he is feeling " better" overall since his medications was adjusted.  States he feels ready to get back home.  Reports he is no longer hearing voices.  He reports a good appetite.  States he is resting well throughout the night.  We reports he has plans to follow-up with [facility] urgent care facility and/or [facility].  He presents pleasant,  cooperative.  Discussed consideration for long-acting injectable.  States he will follow-up with the outpatient provider.  Patient is seeking transportation.    During evaluation on discharge: Hill L Broker is sitting; he is alert/oriented x 4; calm/cooperative; and mood congruent with affect.  Patient is speaking in a clear tone at moderate volume, and normal pace; with good eye contact.   His thought process is coherent and relevant; There is no indication that he is currently responding to internal/external stimuli or experiencing delusional thought content.  Patient denies suicidal/self-harm/homicidal ideation, psychosis, and paranoia.  Patient has remained calm throughout assessment and has answered questions appropriately.   Musculoskeletal: Strength & Muscle Tone: within normal limits Gait & Station: normal Patient leans: N/A  Psychiatric Specialty Exam  Presentation  General Appearance:  Appropriate for Environment  Eye Contact: Good  Speech: Clear and Coherent  Speech Volume: Normal  Handedness: Right   Mood and Affect  Mood: Euthymic  Duration of Depression Symptoms: Greater than two weeks  Affect: Congruent   Thought Process  Thought Processes: Coherent  Descriptions of Associations:Intact  Orientation:Full (Time, Place and Person)  Thought Content:Logical  History of Schizophrenia/Schizoaffective disorder:No  Duration of Psychotic Symptoms:N/A  Hallucinations:Hallucinations: None (reported history with AH. however is denying at discharge) Description of Auditory Hallucinations: none noted  Ideas of Reference:None  Suicidal Thoughts:Suicidal Thoughts: No SI Active Intent and/or Plan: -- (none noted)  Homicidal Thoughts:Homicidal Thoughts: No   Sensorium  Memory: Immediate Fair;  Remote Fair  Judgment: Fair  Insight: Good   Executive Functions  Concentration: Good  Attention Span: Fair  Recall: [name] of  Knowledge: Good  Language: Good   Psychomotor Activity  Psychomotor Activity: Psychomotor Activity: Normal   Assets  Assets: Social Support; Financial Resources/Insurance; Desire for Improvement   Sleep  Sleep: Sleep: Good Number of Hours of Sleep: 8   Physical Exam: Physical Exam Vitals and nursing note reviewed.  Constitutional:      Appearance: Normal appearance.  Cardiovascular:     Rate and Rhythm: Normal rate.  Neurological:     Mental Status: He is alert and oriented to person, place, and time.  Psychiatric:        Mood and Affect: Mood normal.        Behavior: Behavior normal.        Thought Content: Thought content normal.    Review of Systems  Psychiatric/Behavioral:  Negative for depression and suicidal ideas. Hallucinations: improved since admission.The patient is not nervous/anxious.   All other systems reviewed and are negative.  Blood pressure (!) 149/98, pulse (!) 105, temperature 98.1 F (36.7 C), temperature source Oral, resp. rate 17, weight 78.5 kg, SpO2 99%. Body mass index is 24.13 kg/m.  Mental Status Per Nursing Assessment::   On Admission:  Suicidal ideation indicated by patient  Demographic Factors:  Male  Loss Factors: Financial problems/change in socioeconomic status  Historical Factors: NA  Risk Reduction Factors:   Positive social support and Positive therapeutic relationship  Continued Clinical Symptoms:  Schizophrenia:   Paranoid or undifferentiated type  Cognitive Features That Contribute To Risk:  Closed-mindedness    Suicide Risk:  Minimal: No identifiable suicidal ideation.  Patients presenting with no risk factors but with morbid ruminations; may be classified as minimal risk based on the severity of the depressive symptoms   Follow-up Information     [facility] Follow up.   Why: Calls to [facility] were not returned.  Please call to schedule your follow up appointment. Contact  information: [address] [place] [address]                Plan Of Care/Follow-up recommendations:  Activity:  as tolerated  Diet:  Heart healthy  Oneta Rack, NP 09/17/2023, 8:26 AM

## 2023-09-17 NOTE — Progress Notes (Signed)
  [facility] Adult Case Management Discharge Plan :  Will you be returning to the same living situation after discharge:  Yes,  The patient will be returning home to his home address [address],  [place] [address] At discharge, do you have transportation home?: No. Do you have the ability to pay for your medications: Yes,  The patient has medicare through [facility]  Release of information consent forms completed and in the chart;  Patient's signature needed at discharge.  Patient to Follow up at:  Follow-up Information     [facility] Follow up.   Why: Calls to [facility] were not returned.  Please call to schedule your follow up appointment. Contact information: [address] [place] [address]                Next level of care provider has access to [facility] Link:yes  Safety Planning and Suicide Prevention discussed: Yes,  SPI completed with Trumaine Wimer, wife, [phone]      Has patient been referred to the Quitline?: Yes, faxed/e-referral on 09/17/23  Patient has been referred for addiction treatment: Yes, referral information given but appointment not made [facility] (list facility).  Marshell Levan, LCSW 09/17/2023, 1:16 PM

## 2023-09-17 NOTE — Plan of Care (Signed)

## 2023-09-17 NOTE — Progress Notes (Signed)
Pt alert and oriented X4. Affect bright/mood  congruent. Pt denies anxiety/depression,  SI/HI/AVH as well as pain  at this time.  Scheduled medications administered to patient per MD orders. Support and encouragement provided .Routine safety checks conducted every 15 mins  without incident. Patients informed to notify staff with problems or concerns and  verbalize understanding.   R- No adverse drug reactions noted. Pt compliant with medications and treatment plan. Plan receptive, calm cooperative and interacts well with others on the unit. Pt contracts for safety and remains safe on the unit at this time.

## 2023-09-17 NOTE — Discharge Summary (Addendum)
Physician Discharge Summary Note  Patient:  Damon [name] is an 57 y.o., male MRN:  [id number] DOB:  Dec 15, 1965 Patient phone:  [phone] (home)  Patient address:   [address] [place] [address],  Total Time spent with patient: 15 minutes  Date of Admission:  09/10/2023 Date of Discharge: 09/17/2023  Reason for Admission:  Per admission assessment note: " Per HPI: "Damon [name], 57 y.o., male  has past psychiatric history of schizophrenia. he is prescribed Risperdal 3 mg twice daily.    Evaluation at discharge: Damon [name] 57 year old African-American male was seen and evaluated face-to-face by this provider at discharge.  He is denying any suicidal or homicidal ideations.  Denies auditory or visual hallucinations.  States he is feeling " better" overall since his medications was adjusted.  States he feels ready to get back home.  Reports he is no longer hearing voices.  He reports a good appetite.  States he is resting well throughout the night.  We reports he has plans to follow-up with [facility] urgent care facility and/or [facility].  He presents pleasant, cooperative.  Discussed consideration for long-acting injectable.  States he will follow-up with the outpatient provider.  Patient is seeking transportation.   Damon [name] is sitting; he is alert/oriented x 4; calm/cooperative; and mood congruent with affect.  Patient is speaking in a clear tone at moderate volume, and normal pace; with good eye contact.   His thought process is coherent and relevant; There is no indication that he is currently responding to internal/external stimuli or experiencing delusional thought content.    Patient denies suicidal/self-harm/homicidal ideation, psychosis, and paranoia.  Patient has remained calm throughout assessment and has answered questions appropriately.   Principal Problem: Paranoid schizophrenia [facility]) Discharge Diagnoses: Principal Problem:   Paranoid  schizophrenia (HCC) Active Problems:   HTN (hypertension)   Past Psychiatric History: Schizophrenia   Past Medical History:  Past Medical History:  Diagnosis Date   Depression    Hypertension    Paranoid schizophrenia (HCC)    Schizophrenia (HCC)    No past surgical history on file. Family History:  Family History  Problem Relation Age of Onset   Colon cancer Mother    Hyperlipidemia Father    Heart failure Brother    Diabetes Other    Stroke Other    Family Psychiatric  History:  Social History:  Social History   Substance and Sexual Activity  Alcohol Use Yes   Alcohol/week: 1.0 standard drink of alcohol   Types: 1 Cans of beer per week   Comment: one can of beer weekly     Social History   Substance and Sexual Activity  Drug Use Not Currently   Types: Marijuana    Social History   Socioeconomic History   Marital status: Single    Spouse name: Not on file   Number of children: Not on file   Years of education: Not on file   Highest education level: Not on file  Occupational History   Not on file  Tobacco Use   Smoking status: Never   Smokeless tobacco: Never  Substance and Sexual Activity   Alcohol use: Yes    Alcohol/week: 1.0 standard drink of alcohol    Types: 1 Cans of beer per week    Comment: one can of beer weekly   Drug use: Not Currently    Types: Marijuana   Sexual activity: Yes    Birth control/protection: Condom  Other  Topics Concern   Not on file  Social History Narrative   ** Merged History Encounter **       Social Determinants of Health   Financial Resource Strain: Low Risk  (01/13/2023)   Overall Financial Resource Strain (CARDIA)    Difficulty of Paying Living Expenses: Not hard at all  Food Insecurity: Patient Unable To Answer (09/11/2023)   Hunger Vital Sign    Worried About Running Out of Food in the Last Year: Patient unable to answer    Ran Out of Food in the Last Year: Patient unable to answer  Transportation Needs:  Patient Unable To Answer (09/11/2023)   PRAPARE - Transportation    Lack of Transportation (Medical): Patient unable to answer    Lack of Transportation (Non-Medical): Patient unable to answer  Physical Activity: Sufficiently Active (01/13/2023)   Exercise Vital Sign    Days of Exercise per Week: 5 days    Minutes of Exercise per Session: 60 min  Stress: Stress Concern Present (01/13/2023)   [employer] of Occupational Health - Occupational Stress Questionnaire    Feeling of Stress : To some extent  Social Connections: Not on File (07/30/2023)   Received from [facility]   Social Connections    Connectedness: 0    Hospital Course:  Damon [name] was admitted for Paranoid schizophrenia [facility])  with auditory hallucination  and crisis management.  Pt was treated discharged with the medications listed below under Medication List.  Medical problems were identified and treated as needed.  Home medications were restarted as appropriate.  Improvement was monitored by observation and Damon [name] 's daily report of symptom reduction.  Emotional and mental status was monitored by daily self-inventory reports completed by Damon [name] and clinical staff.         Damon [name] was evaluated by the treatment team for stability and plans for continued recovery upon discharge. Damon [name] 's motivation was an integral factor for scheduling further treatment. Employment, transportation, bed availability, health status, family support, and any pending legal issues were also considered during hospital stay. Pt was offered further treatment options upon discharge including but not limited to Residential, Intensive Outpatient, and Outpatient treatment.  Damon [name] will follow up with the services as listed below under Follow Up Information.     Upon completion of this admission the patient was both mentally and medically stable for discharge denying suicidal/homicidal ideation,  auditory/visual/tactile hallucinations, delusional thoughts and paranoia.    Damon [name] responded well to treatment with Risperdal 3 mg po BID without adverse effects. Pt demonstrated improvement without reported or observed adverse effects to the point of stability appropriate for outpatient management. Pertinent labs include: Lipid Panel: elevated Triglycerides and Hemoglobin A1c 5.7  for which outpatient follow-up is necessary for lab recheck as mentioned below. Reviewed CBC, CMP, BAL, and UDS; all unremarkable aside from noted exceptions.   Physical Findings: AIMS:  , ,  ,  ,    CIWA:  CIWA-Ar Total: 2 COWS:     Musculoskeletal: Strength & Muscle Tone: within normal limits Gait & Station: normal Patient leans: N/A   Psychiatric Specialty Exam:  Presentation  General Appearance:  Appropriate for Environment  Eye Contact: Good  Speech: Clear and Coherent  Speech Volume: Normal  Handedness: Right   Mood and Affect  Mood: Euthymic  Affect: Congruent   Thought Process  Thought Processes: Coherent  Descriptions of Associations:Intact  Orientation:Full (Time, Place and Person)  Thought  Content:Logical  History of Schizophrenia/Schizoaffective disorder:No  Duration of Psychotic Symptoms:N/A  Hallucinations:Hallucinations: None (reported history with AH. however is denying at discharge) Description of Auditory Hallucinations: none noted  Ideas of Reference:None  Suicidal Thoughts:Suicidal Thoughts: No SI Active Intent and/or Plan: -- (none noted)  Homicidal Thoughts:Homicidal Thoughts: No   Sensorium  Memory: Immediate Fair; Remote Fair  Judgment: Fair  Insight: Good   Executive Functions  Concentration: Good  Attention Span: Fair  Recall: Good  Fund of Knowledge: Good  Language: Good   Psychomotor Activity  Psychomotor Activity: Psychomotor Activity: Normal   Assets  Assets: Social Support; Financial  Resources/Insurance; Desire for Improvement   Sleep  Sleep: Sleep: Good Number of Hours of Sleep: 8    Physical Exam: Physical Exam Vitals and nursing note reviewed.  Neurological:     Mental Status: He is alert and oriented to person, place, and time.  Psychiatric:        Mood and Affect: Mood normal.        Behavior: Behavior normal.        Thought Content: Thought content normal.    Review of Systems  Psychiatric/Behavioral:  Negative for depression and suicidal ideas. Hallucinations: reported voice has improved.The patient is not nervous/anxious.   All other systems reviewed and are negative.  Blood pressure (!) 149/98, pulse (!) 105, temperature 98.1 F (36.7 C), temperature source Oral, resp. rate 17, weight 78.5 kg, SpO2 99%. Body mass index is 24.13 kg/m.   Social History   Tobacco Use  Smoking Status Never  Smokeless Tobacco Never   Tobacco Cessation:  N/A, patient does not currently use tobacco products   Blood Alcohol level:  Lab Results  Component Value Date   ETH <10 09/10/2023   ETH <10 06/12/2023    Metabolic Disorder Labs:  Lab Results  Component Value Date   HGBA1C 5.7 (H) 09/11/2023   MPG 116.89 09/11/2023   MPG 125.5 10/13/2017   Lab Results  Component Value Date   PROLACTIN 55.0 (H) 10/13/2017   Lab Results  Component Value Date   CHOL 197 09/11/2023   TRIG 276 (H) 09/11/2023   HDL 83 09/11/2023   CHOLHDL 2.4 09/11/2023   VLDL 55 (H) 09/11/2023   LDLCALC 59 09/11/2023   LDLCALC 90 10/31/2022    See Psychiatric Specialty Exam and Suicide Risk Assessment completed by Attending Physician prior to discharge.  Discharge destination:  Other:  [facility] urgent [facility] and or [facility]   Is patient on multiple antipsychotic therapies at discharge:  No   Has Patient had three or more failed trials of antipsychotic monotherapy by history:  No  Recommended Plan for Multiple Antipsychotic Therapies: NA  Discharge Instructions      Diet - low sodium heart healthy   Complete by: As directed    Increase activity slowly   Complete by: As directed       Allergies as of 09/17/2023       Reactions   Spinach Hives   Zofran [ondansetron] Rash        Medication List     TAKE these medications      Indication  acetaminophen 325 MG tablet Commonly known as: TYLENOL Take 650 mg by mouth every 6 (six) hours as needed for mild pain or headache.  Indication: Fever, Pain   benazepril 10 MG tablet Commonly known as: LOTENSIN Take 1 tablet (10 mg total) by mouth daily.  Indication: High Blood Pressure   hydrALAZINE 25 MG tablet Commonly  known as: APRESOLINE Take 1 tablet (25 mg total) by mouth 3 (three) times daily as needed. If systolic is greater than 180, or diastolic greater than 110  Indication: High Blood Pressure   risperiDONE 3 MG tablet Commonly known as: RisperDAL Take 1 tablet (3 mg total) by mouth 2 (two) times daily.  Indication: Major Depressive Disorder, Schizophrenia   traZODone 50 MG tablet Commonly known as: DESYREL Take 1 tablet (50mg ) by mouth daily AT BEDTIME for Insomnia  Indication: Trouble Sleeping        Follow-up Information     [facility] Follow up.   Why: Calls to [facility] were not returned.  Please call to schedule your follow up appointment. Contact information: [address] [place] [address]                Follow-up recommendations:  Activity:  as tolerated Diet:  heart healthy   Comments:  Take all medications as prescribed. Keep all follow-up appointments as scheduled.  Do not consume alcohol or use illegal drugs while on prescription medications. Report any adverse effects from your medications to your primary care provider promptly.  In the event of recurrent symptoms or worsening symptoms, call 911, a crisis hotline, or go to the nearest emergency department for evaluation.   Signed: Oneta Rack,  NP 09/17/2023, 8:35 AM

## 2023-09-17 NOTE — Progress Notes (Signed)
Discharge Note:  Patient denies SI/HI/AVH at this time. Discharge instructions, AVS and transition record gone over with patient. Patient agrees to comply with medication management, follow-up visit, and outpatient therapy. Patient belongings returned to patient. No questions or complaints at present. Patient ambulatory off unit. Patient discharged to home vis cab.

## 2023-09-19 ENCOUNTER — Telehealth: Payer: Self-pay

## 2023-09-19 NOTE — Transitions of Care (Post Inpatient/ED Visit) (Signed)
   09/19/2023  Name: ARLAND USERY MRN: [id number] DOB: [birth year]  Today's TOC FU Call Status: Today's TOC FU Call Status:: Unsuccessful Call (1st Attempt) Unsuccessful Call (1st Attempt) Date: 09/19/23  Attempted to reach the patient regarding the most recent Inpatient/ED visit.  Follow Up Plan: Additional outreach attempts will be made to reach the patient to complete the Transitions of Care (Post Inpatient/ED visit) call.   Alyse Low, RN, BA, [facility], CRRN [facility] Coordinator, Transition of Care Ph # [phone]

## 2023-09-20 ENCOUNTER — Telehealth: Payer: Self-pay

## 2023-09-20 NOTE — Transitions of Care (Post Inpatient/ED Visit) (Signed)
   09/20/2023  Name: Damon [name] MRN: [id number] DOB: 1966/02/10  Today's TOC FU Call Status: Today's TOC FU Call Status:: Unsuccessful Call (2nd Attempt) Unsuccessful Call (2nd Attempt) Date: 09/20/23  Attempted to reach the patient regarding the most recent Inpatient/ED visit.  Follow Up Plan: Additional outreach attempts will be made to reach the patient to complete the Transitions of Care (Post Inpatient/ED visit) call.   Alyse Low, RN, BA, [facility], CRRN [facility] Coordinator, Transition of Care Ph # [phone]

## 2023-09-21 ENCOUNTER — Telehealth: Payer: Self-pay

## 2023-09-21 NOTE — Transitions of Care (Post Inpatient/ED Visit) (Signed)
   09/21/2023  Name: Damon [name] MRN: [id number] DOB: [birth year]  Today's TOC FU Call Status: Today's TOC FU Call Status:: Unsuccessful Call (3rd Attempt) Unsuccessful Call (3rd Attempt) Date: 09/21/23  Attempted to reach the patient regarding the most recent Inpatient/ED visit.  Follow Up Plan: No further outreach attempts will be made at this time. We have been unable to contact the patient.  Alyse Low, RN, BA, [facility], CRRN [facility] Coordinator, Transition of Care Ph # [phone]

## 2024-01-28 ENCOUNTER — Encounter (HOSPITAL_COMMUNITY): Payer: Self-pay | Admitting: *Deleted

## 2024-01-28 ENCOUNTER — Other Ambulatory Visit: Payer: Self-pay

## 2024-01-28 ENCOUNTER — Emergency Department (HOSPITAL_COMMUNITY)
Admission: EM | Admit: 2024-01-28 | Discharge: 2024-01-28 | Disposition: A | Discharge status: Other Acute Inpatient Hospital | Attending: Emergency Medicine | Admitting: Emergency Medicine

## 2024-01-28 DIAGNOSIS — R440 Auditory hallucinations: Secondary | ICD-10-CM | POA: Diagnosis not present

## 2024-01-28 DIAGNOSIS — Z91148 Patient's other noncompliance with medication regimen for other reason: Secondary | ICD-10-CM

## 2024-01-28 DIAGNOSIS — F200 Paranoid schizophrenia: Secondary | ICD-10-CM | POA: Diagnosis present

## 2024-01-28 DIAGNOSIS — I10 Essential (primary) hypertension: Secondary | ICD-10-CM | POA: Insufficient documentation

## 2024-01-28 DIAGNOSIS — Z79899 Other long term (current) drug therapy: Secondary | ICD-10-CM | POA: Insufficient documentation

## 2024-01-28 DIAGNOSIS — F209 Schizophrenia, unspecified: Secondary | ICD-10-CM

## 2024-01-28 LAB — ACETAMINOPHEN LEVEL: Acetaminophen (Tylenol), Serum: <10 ug/mL — ABNORMAL LOW (ref 10–30)

## 2024-01-28 LAB — COMPREHENSIVE METABOLIC PANEL
ALT: 38 U/L (ref 0–44)
AST: 44 U/L — ABNORMAL HIGH (ref 15–41)
Albumin: 4.1 g/dL (ref 3.5–5.0)
Alkaline Phosphatase: 47 U/L (ref 38–126)
Anion gap: 14 (ref 5–15)
BUN: 11 mg/dL (ref 6–20)
CO2: 19 mmol/L — ABNORMAL LOW (ref 22–32)
Calcium: 9.1 mg/dL (ref 8.9–10.3)
Chloride: 102 mmol/L (ref 98–111)
Creatinine, Ser: 1.24 mg/dL (ref 0.61–1.24)
GFR, Estimated: >60 mL/min (ref >60)
Glucose, Bld: 83 mg/dL (ref 70–99)
Potassium: 3.7 mmol/L (ref 3.5–5.1)
Sodium: 135 mmol/L (ref 135–145)
Total Bilirubin: 0.6 mg/dL (ref 0.0–1.2)
Total Protein: 7.7 g/dL (ref 6.5–8.1)

## 2024-01-28 LAB — RAPID URINE DRUG SCREEN, HOSP PERFORMED
Amphetamines: NOT DETECTED
Barbiturates: NOT DETECTED
Benzodiazepines: NOT DETECTED
Cocaine: NOT DETECTED
Opiates: NOT DETECTED
Tetrahydrocannabinol: NOT DETECTED

## 2024-01-28 LAB — CBC
HCT: 40.4 % (ref 39.0–52.0)
Hemoglobin: 13.6 g/dL (ref 13.0–17.0)
MCH: 30.9 pg (ref 26.0–34.0)
MCHC: 33.7 g/dL (ref 30.0–36.0)
MCV: 91.8 fL (ref 80.0–100.0)
Platelets: 251 10*3/uL (ref 150–400)
RBC: 4.4 MIL/uL (ref 4.22–5.81)
RDW: 12.6 % (ref 11.5–15.5)
WBC: 6.2 10*3/uL (ref 4.0–10.5)
nRBC: 0 % (ref 0.0–0.2)

## 2024-01-28 LAB — SALICYLATE LEVEL: Salicylate Lvl: <7 mg/dL — ABNORMAL LOW (ref 7.0–30.0)

## 2024-01-28 LAB — ETHANOL: Alcohol, Ethyl (B): 49 mg/dL — ABNORMAL HIGH (ref <10)

## 2024-01-28 MED ORDER — RISPERIDONE 3 MG PO TABS
3.0000 mg | ORAL_TABLET | Freq: Two times a day (BID) | ORAL | Status: DC
  Administered 2024-01-28 (×2): 3 mg via ORAL
  Filled 2024-01-28 (×3): qty 1

## 2024-01-28 MED ORDER — AMLODIPINE BESYLATE 5 MG PO TABS
10.0000 mg | ORAL_TABLET | Freq: Once | ORAL | Status: AC
  Administered 2024-01-28: 10 mg via ORAL
  Filled 2024-01-28: qty 2

## 2024-01-28 MED ORDER — AMLODIPINE BESYLATE 5 MG PO TABS
10.0000 mg | ORAL_TABLET | Freq: Every day | ORAL | Status: DC
Start: 2024-01-29 — End: 2024-01-29

## 2024-01-28 MED ORDER — TRAZODONE HCL 50 MG PO TABS
50.0000 mg | ORAL_TABLET | Freq: Every day | ORAL | Status: DC
Start: 2024-01-28 — End: 2024-01-29
  Filled 2024-01-28: qty 1

## 2024-01-28 MED ORDER — BENAZEPRIL HCL 5 MG PO TABS: 10.0000 mg | ORAL_TABLET | Freq: Every day | ORAL | Status: DC

## 2024-01-28 MED ORDER — HYDRALAZINE HCL 25 MG PO TABS
25.0000 mg | ORAL_TABLET | Freq: Once | ORAL | Status: AC
  Administered 2024-01-28: 25 mg via ORAL
  Filled 2024-01-28: qty 1

## 2024-01-28 MED ORDER — ACETAMINOPHEN 325 MG PO TABS
650.0000 mg | ORAL_TABLET | Freq: Once | ORAL | Status: AC | PRN
  Administered 2024-01-28: 650 mg via ORAL
  Filled 2024-01-28: qty 2

## 2024-01-28 NOTE — Consult Note (Signed)
 [facility] Psychiatric Consult Initial  Patient Name: .Damon [name]  MRN: [id number]  DOB: July 04, 1966  Consult Order details:  Orders (From admission, onward)     Start     Ordered   01/28/24 1121  CONSULT TO CALL ACT TEAM       Ordering Provider: Dorthy Cooler, PA-C  Provider:  (Not yet assigned)  Question:  Reason for Consult?  Answer:  hearing voices telling him to harm himself   01/28/24 1121             Mode of Visit: In person    Psychiatry Consult Evaluation  Service Date: January 28, 2024 LOS:  LOS: 0 days  Chief Complaint auditory hallucinations  Primary Psychiatric Diagnoses  Paranoid schizophrenia  2.  Auditory hallucinations 3.  Noncompliance with medication regimen  Assessment  Damon [name] is a 58 y.o. male admitted: Presented to the [facility] 01/28/2024  9:43 AM for auditory hallucinations. He carries the psychiatric diagnoses of paranoid schizophrenia and has a past medical history of  HTN.   His current presentation of AH is most consistent with medication noncompliance. He meets criteria for IP based on active auditory hallucinations.  Current outpatient psychotropic medications include Risperidone 3 mg BID and historically he has had a positive response to these medications. He was not compliant with medications prior to admission as evidenced by patient report. On initial examination, patient is pleasant, cooperative, requesting treatment to restart medications. Please see plan below for detailed recommendations.   Diagnoses:  Active Hospital problems: Principal Problem:   Auditory hallucinations Active Problems:   Paranoid schizophrenia (HCC)   Noncompliance with medication regimen    Plan   ## Psychiatric Medication Recommendations:  Continue Risperidone 3 mg BID  ## Medical Decision Making Capacity: Not specifically addressed in this encounter  ## Further Work-up:  EKG pending   ## Disposition:-- We recommend inpatient  psychiatric hospitalization when medically cleared. Patient is under voluntary admission status at this time; please IVC if attempts to leave hospital.  ## Behavioral / Environmental: - No specific recommendations at this time.     ## Safety and Observation Level:  - Based on my clinical evaluation, I estimate the patient to be at low risk of self harm in the current setting. - At this time, we recommend  routine. This decision is based on my review of the chart including patient's history and current presentation, interview of the patient, mental status examination, and consideration of suicide risk including evaluating suicidal ideation, plan, intent, suicidal or self-harm behaviors, risk factors, and protective factors. This judgment is based on our ability to directly address suicide risk, implement suicide prevention strategies, and develop a safety plan while the patient is in the clinical setting. Please contact our team if there is a concern that risk level has changed.  CSSR Risk Category:C-SSRS RISK CATEGORY: No Risk  Suicide Risk Assessment: Patient has following modifiable risk factors for suicide: medication noncompliance, which we are addressing by medication education and IP tx. Patient has following non-modifiable or demographic risk factors for suicide: male gender Patient has the following protective factors against suicide: Access to outpatient mental health care, Supportive family, Supportive friends, and Cultural, spiritual, or religious beliefs that discourage suicide  Thank you for this consult request. Recommendations have been communicated to the primary team.  We will recommend transfer to [facility] at this time.   Eligha Bridegroom, NP       History of Present Illness  Relevant  Aspects of Hospital ED Course:  Damon [name] is a 58 y.o. male with PMHx depression, HTN, paranoid schizophrenia, who presents to ED concerned for auditory hallucinations that has been getting  worse over the past 3 days. Patient stating that he forgot to take one dose of his home psychiatric medication 3 days ago and this is when symptoms started. Patient stating that the voices are telling him to harm himself. Denies HI. Also endorses intermittent visual hallucinations.   Patient Report:  Patient seen at [facility], ED for face-to-face psychiatric evaluation.  Patient presented voluntarily today due to worsening hallucinations.  Patient reports auditory hallucinations, sometimes command in nature to harm himself, more often than not random voices with no specific commands.  He also reports occasional visual hallucinations.  At this moment he is denying current visual hallucinations, but does feel like he is hearing auditory hallucinations of "nonsense."  Patient denies suicidal ideations.  Reports even if the voices tell him to harm himself he does not want to do that.  Denies HI.  He does feel like his sleep has been disturbed over the past few days due to auditory hallucinations feeling louder at night.  Denies problems with appetite.  Denies any illicit substance use or alcohol use.  Patient stated he has been compliant with his risperidone 3 mg twice daily and has been doing really well.  However 3 days ago he forgot to take his morning dose, and when it was time for him to take his evening dose he realized he had forgotten.  He became worried and was not sure if he should double up on the dose or what he should do.  He stated he remembered his doctor telling him if he were to stop taking his medications they would have to adjust the dose.  He currently does not have a outpatient psychiatrist and was not sure where to go so he presented to the ED.  Patient has not taken his medications in 3 days due to not knowing what to do.  I did educate the patient on his medications, stated it is not preferred to miss doses as compliance is very important, however if this happens in the future he does not  have to stop taking his medications completely and can continue on the regular schedule.  Explained to patient since it was only 3 days without his medications I do feel confident we can continue his 3 mg twice a day dosing and do not need to titrate.  Clarified to patient if it had been weeks or months without his medications then we would need to titrate, he appreciated that clarification.  Ultimately with the patient's worsening hallucinations over the past few days he is requesting voluntary treatment to restart his medications prior to going home.  He does live alone and he started to become afraid, he does not feel safe returning home by himself at this time.  Will restart home medications.  Will recommend patient be transferred to [facility] to stabilize and restart medications.   Review of Systems  Psychiatric/Behavioral:  Positive for hallucinations.   All other systems reviewed and are negative.    Psychiatric and Social History  Psychiatric History:  Information collected from patient, chart  Prev Dx/Sx: paranoid schizophrenia Current Psych Provider: denies having one Home Meds (current): risperidone Previous Med Trials: unknown Therapy: denies  Prior Psych Hospitalization: yes  Prior Self Harm: denies Prior Violence: denies   Social History:  Developmental Hx: wdl Educational Hx:  high school Occupational Hx: disability Legal Hx: denies Living Situation: lives alone Spiritual Hx: yes Access to weapons/lethal means: denies   Substance History Alcohol: denies  Tobacco: denies Illicit drugs: denies Prescription drug abuse: denies  Exam Findings  Physical Exam:  Vital Signs:  Temp:  [97.9 F (36.6 C)-98.5 F (36.9 C)] 98.5 F (36.9 C) (03/15 1129) Pulse Rate:  [88-106] 88 (03/15 1129) Resp:  [15-18] 15 (03/15 1129) BP: (177-179)/(107-118) 177/107 (03/15 1129) SpO2:  [99 %-100 %] 99 % (03/15 1129) Weight:  [77.1 kg] 77.1 kg (03/15 0952) Blood pressure (!) 177/107,  pulse 88, temperature 98.5 F (36.9 C), resp. rate 15, height 5\' 11"  (1.803 m), weight 77.1 kg, SpO2 99%. Body mass index is 23.71 kg/m.  Physical Exam Vitals and nursing note reviewed.  Neurological:     Mental Status: He is alert and oriented to person, place, and time.  Psychiatric:        Attention and Perception: He perceives auditory hallucinations.        Mood and Affect: Mood is anxious.        Speech: Speech normal.        Behavior: Behavior is cooperative.        Thought Content: Thought content normal.     Mental Status Exam: General Appearance: Well Groomed  Orientation:  Full (Time, Place, and Person)  Memory:  Immediate;   Good Recent;   Good  Concentration:  Concentration: Good  Recall:  Good  Attention  Good  Eye Contact:  Good  Speech:  Clear and Coherent  Language:  Good  Volume:  Normal  Mood: "not so great"  Affect:  Appropriate  Thought Process:  Coherent  Thought Content:  WDL  Suicidal Thoughts:  No  Homicidal Thoughts:  No  Judgement:  Fair  Insight:  Fair  Psychomotor Activity:  Normal  Akathisia:  No  Fund of Knowledge:  Fair      Assets:  Communication Skills Desire for Improvement Physical Health Resilience Social Support  Cognition:  WNL  ADL's:  Intact  AIMS (if indicated):        Other History   These have been pulled in through the EMR, reviewed, and updated if appropriate.  Family History:  The patient's family history includes Colon cancer in his mother; Diabetes in an other family member; Heart failure in his brother; Hyperlipidemia in his father; Stroke in an other family member.  Medical History: Past Medical History:  Diagnosis Date   Depression    Hypertension    Paranoid schizophrenia (HCC)    Schizophrenia (HCC)     Surgical History: No past surgical history on file.   Medications:   Current Facility-Administered Medications:    risperiDONE (RISPERDAL) tablet 3 mg, 3 mg, Oral, BID, Eligha Bridegroom, NP    traZODone (DESYREL) tablet 50 mg, 50 mg, Oral, QHS, Eligha Bridegroom, NP  Current Outpatient Medications:    acetaminophen (TYLENOL) 325 MG tablet, Take 650 mg by mouth every 6 (six) hours as needed for mild pain or headache., Disp: , Rfl:    amLODipine (NORVASC) 10 MG tablet, Take 1 tablet (10 mg total) by mouth daily., Disp: 30 tablet, Rfl: 0   benazepril (LOTENSIN) 10 MG tablet, Take 1 tablet (10 mg total) by mouth daily., Disp: 30 tablet, Rfl: 2   benazepril (LOTENSIN) 40 MG tablet, Take 1 tablet (40 mg total) by mouth daily., Disp: 60 tablet, Rfl: 0   hydrALAZINE (APRESOLINE) 25 MG tablet, Take 1 tablet (25  mg total) by mouth 3 (three) times daily as needed. If systolic is greater than 180, or diastolic greater than 110, Disp: 90 tablet, Rfl: 0   risperiDONE (RISPERDAL) 3 MG tablet, Take 1 tablet (3 mg total) by mouth 2 (two) times daily., Disp: 60 tablet, Rfl: 2   traZODone (DESYREL) 50 MG tablet, Take 1 tablet (50mg ) by mouth daily AT BEDTIME for Insomnia, Disp: 15 tablet, Rfl: 1  Allergies: Allergies  Allergen Reactions   Spinach Hives   Zofran [Ondansetron] Rash    Eligha Bridegroom, NP

## 2024-01-28 NOTE — ED Provider Notes (Signed)
 [place] EMERGENC[facility] DEPARTMENT AT [facility] Provider Note   CSN: [id number] Arrival date & time: 01/28/24  7829     History  Chief Complaint  Patient presents with   Medical Clearance    Damon [name] is a 58 y.o. male with PMHx depression, HTN, paranoid schizophrenia, who presents to ED concerned for auditory hallucinations that has been getting worse over the past 3 days. Patient stating that he forgot to take one dose of his home psychiatric medication 3 days ago and this is when symptoms started. Patient stating that the voices are telling him to harm himself. Denies HI. Also endorses intermittent visual hallucinations.   Also endorses some recent mild congestion, but denies any other infectious symptoms today.  HPI     Home Medications Prior to Admission medications   Medication Sig Start Date End Date Taking? Authorizing Provider  acetaminophen (TYLENOL) 325 MG tablet Take 650 mg by mouth every 6 (six) hours as needed for mild pain or headache.    [provider]  amLODipine (NORVASC) 10 MG tablet Take 1 tablet (10 mg total) by mouth daily. 09/18/23   Oneta Rack, NP  benazepril (LOTENSIN) 10 MG tablet Take 1 tablet (10 mg total) by mouth daily. 09/10/23   Prosperi, Christian H, PA-C  benazepril (LOTENSIN) 40 MG tablet Take 1 tablet (40 mg total) by mouth daily. 09/18/23   Oneta Rack, NP  hydrALAZINE (APRESOLINE) 25 MG tablet Take 1 tablet (25 mg total) by mouth 3 (three) times daily as needed. If systolic is greater than 180, or diastolic greater than 110 09/10/23   Prosperi, Christian H, PA-C  risperiDONE (RISPERDAL) 3 MG tablet Take 1 tablet (3 mg total) by mouth 2 (two) times daily. 09/10/23   Prosperi, Christian H, PA-C  traZODone (DESYREL) 50 MG tablet Take 1 tablet (50mg ) by mouth daily AT BEDTIME for Insomnia 11/12/22         Allergies    Spinach and Zofran [ondansetron]    Review of Systems   Review of Systems   Psychiatric/Behavioral:  Positive for hallucinations.     Physical Exam Updated Vital Signs BP (!) 177/107   Pulse 88   Temp 98.5 F (36.9 C)   Resp 15   Ht 5\' 11"  (1.803 m)   Wt 77.1 kg   SpO2 99%   BMI 23.71 kg/m  Physical Exam Vitals and nursing note reviewed.  Constitutional:      General: He is not in acute distress.    Appearance: He is not ill-appearing or toxic-appearing.  HENT:     Head: Normocephalic and atraumatic.     Mouth/Throat:     Mouth: Mucous membranes are moist.  Eyes:     General: No scleral icterus.       Right eye: No discharge.        Left eye: No discharge.     Conjunctiva/sclera: Conjunctivae normal.  Cardiovascular:     Rate and Rhythm: Normal rate and regular rhythm.     Pulses: Normal pulses.     Heart sounds: Normal heart sounds. No murmur heard. Pulmonary:     Effort: Pulmonary effort is normal. No respiratory distress.     Breath sounds: Normal breath sounds. No wheezing, rhonchi or rales.  Abdominal:     General: Abdomen is flat. Bowel sounds are normal. There is no distension.     Palpations: Abdomen is soft. There is no mass.     Tenderness: There is no  abdominal tenderness.  Musculoskeletal:     Right lower leg: No edema.     Left lower leg: No edema.  Skin:    General: Skin is warm and dry.     Findings: No rash.  Neurological:     General: No focal deficit present.     Mental Status: He is alert. Mental status is at baseline.     Comments: Normal affect. Answering questions appropriately. Does not seem to be responding to internal stimuli.  Psychiatric:        Mood and Affect: Mood normal.     ED Results / Procedures / Treatments   Labs (all labs ordered are listed, but only abnormal results are displayed) Labs Reviewed  COMPREHENSIVE METABOLIC PANEL - Abnormal; Notable for the following components:      Result Value   CO2 19 (*)    AST 44 (*)    All other components within normal limits  ETHANOL - Abnormal;  Notable for the following components:   Alcohol, Ethyl (B) 49 (*)    All other components within normal limits  SALICYLATE LEVEL - Abnormal; Notable for the following components:   Salicylate Lvl <7.0 (*)    All other components within normal limits  ACETAMINOPHEN LEVEL - Abnormal; Notable for the following components:   Acetaminophen (Tylenol), Serum <10 (*)    All other components within normal limits  CBC  RAPID URINE DRUG SCREEN, HOSP PERFORMED    EKG None  Radiology No results found.  Procedures Procedures    Medications Ordered in ED Medications - No data to display  ED Course/ Medical Decision Making/ A&P                                 Medical Decision Making Amount and/or Complexity of Data Reviewed Labs: ordered.   This patient presents to the ED for psych evaluation, this involves an extensive number of treatment options, and is a complaint that carries with it a high risk of complications and morbidity.  The differential diagnosis includes primary psychosis, substance-induced psychosis, mood disturbance, SI/HI.   Co morbidities that complicate the patient evaluation  depression, HTN, paranoid schizophrenia,   Additional history obtained:  Dr. Katrinka Blazing PCP   Lab Tests:  I Ordered, and personally interpreted labs.  The pertinent results include:   - UDS: negative - Acetaminophen/salicylate level: Negative - CBC: without leukocytosis or anemia - CMP: reassuring - ETOH: elevated at 49 - patient clinically sober, ambulating well, answering questions appropriately. Patient stating that this could be from the Nyquil that he took earlier for his congestion.   Problem List / ED Course / Critical interventions / Medication management  Patient presented for psychiatric evaluation. Patient is endorsing visual/auditory hallucinations telling him to harm himself. Patient does have a history of schizophrenia and endorses compliance on their medications - except  for one missed dose 3 days ago.  On my initial exam, the pt was linear in thought, appropriate in affect, and overall well-appearing. Vital signs reviewed and reassuring. With the patient's presentation of hallucinations and SI, patient warrants emergent psychiatric consultation.  Patient placed into ED psychiatric hold protocol including suicide precautions, elopement precautions and vital sign monitoring. TTS consulted for further evaluation once patient medically cleared. Medical screening evaluation ordered and reviewed with no obvious medical reason to postpone psychiatric evaluation. Patient is voluntary at this time. No IVC. May need to be reassessed if capacity  is changing.  I have reviewed the patients home medicines and have made adjustments as needed Dispo pending psychiatric consult.   Social Determinants of Health:  none         Final Clinical Impression(s) / ED Diagnoses Final diagnoses:  Schizophrenia, unspecified type [facility])  Auditory hallucination    Rx / DC Orders ED Discharge Orders     None         Margarita Rana 01/28/24 1204    Wynetta Fines, MD 01/29/24 1004

## 2024-01-28 NOTE — ED Notes (Signed)
 Eating dinner. Appetite good. Girlfriend at bedside for visit. Damon [name] A&O x 4, calm, cooperative and no complaints at this time.

## 2024-01-28 NOTE — Progress Notes (Signed)
 LCSW Progress Note:   Damon [name]  MRN:  [id number]  01/28/2024 12:53 PM  Per [facility] AC Malva Limes, RN), we can tentatively accept to [facility]. [facility] pending repeat VS and home meds restarted. there is a bed available now but BP must be improved. I will circle back to check the VS to green light the bed. Attending MD Goli. Pt must sign a voluntary consent and fax that to [phone] and send original with pt at transfer if not using ECONSENT. DX Paranoid Schizophrenia. RN TO RN report to [phone]. [facility] address is [address] [place] . Patient's care team provided disposition updates.

## 2024-01-28 NOTE — ED Triage Notes (Signed)
 C/o hearing voices 3 days ago states unsure when he last took his pysch meds. However when   ask several times if he wanted to hurt himself or anyone else he denied states I just want to get these voices  out of his head  he is afraid to eat.

## 2024-01-28 NOTE — ED Notes (Signed)
 Voluntary consent for transfer to [facility]/ [facility] signed by patient. Given to Manufacturing systems engineer for fax over. Pending transfer when okay is given by staff at [facility].

## 2024-01-28 NOTE — ED Notes (Signed)
 Patients belongings collected and placed in locker 10 in purple.

## 2024-01-28 NOTE — ED Notes (Signed)
 Informed by Ryta, receiving nurse at [facility] that she will call back for report once she finishes the admission she is getting now and patient will be able to come over after report is given.

## 2024-01-29 ENCOUNTER — Other Ambulatory Visit (HOSPITAL_COMMUNITY): Admission: EM | Admit: 2024-01-29 | Discharge: 2024-01-29 | Disposition: A | Discharge status: Home / Self Care

## 2024-01-29 ENCOUNTER — Other Ambulatory Visit (HOSPITAL_COMMUNITY)
Admission: EM | Admit: 2024-01-29 | Discharge: 2024-01-31 | Disposition: A | Discharge status: Home / Self Care | Attending: Psychiatry | Admitting: Psychiatry

## 2024-01-29 DIAGNOSIS — F200 Paranoid schizophrenia: Secondary | ICD-10-CM

## 2024-01-29 DIAGNOSIS — R440 Auditory hallucinations: Secondary | ICD-10-CM

## 2024-01-29 MED ORDER — LORAZEPAM 2 MG/ML IJ SOLN: 2.0000 mg | Freq: Three times a day (TID) | INTRAMUSCULAR | Status: DC | PRN

## 2024-01-29 MED ORDER — TRAZODONE HCL 50 MG PO TABS
50.0000 mg | ORAL_TABLET | Freq: Every day | ORAL | Status: DC
  Administered 2024-01-29 – 2024-01-30 (×2): 50 mg via ORAL
  Filled 2024-01-29 (×2): qty 1

## 2024-01-29 MED ORDER — HALOPERIDOL LACTATE 5 MG/ML IJ SOLN: 5.0000 mg | Freq: Three times a day (TID) | INTRAMUSCULAR | Status: DC | PRN

## 2024-01-29 MED ORDER — DIPHENHYDRAMINE HCL 50 MG/ML IJ SOLN: 50.0000 mg | Freq: Three times a day (TID) | INTRAMUSCULAR | Status: DC | PRN

## 2024-01-29 MED ORDER — MAGNESIUM HYDROXIDE 400 MG/5ML PO SUSP: 30.0000 mL | Freq: Every day | ORAL | Status: DC | PRN

## 2024-01-29 MED ORDER — DIPHENHYDRAMINE HCL 50 MG PO CAPS: 50.0000 mg | ORAL_CAPSULE | Freq: Three times a day (TID) | ORAL | Status: DC | PRN

## 2024-01-29 MED ORDER — HALOPERIDOL 5 MG PO TABS: 5.0000 mg | ORAL_TABLET | Freq: Three times a day (TID) | ORAL | Status: DC | PRN

## 2024-01-29 MED ORDER — SALINE SPRAY 0.65 % NA SOLN
1.0000 | Freq: Once | NASAL | Status: AC
  Administered 2024-01-29: 1 via NASAL
  Filled 2024-01-29: qty 44

## 2024-01-29 MED ORDER — AMLODIPINE BESYLATE 10 MG PO TABS
10.0000 mg | ORAL_TABLET | Freq: Every day | ORAL | Status: DC
  Administered 2024-01-29 – 2024-01-31 (×3): 10 mg via ORAL
  Filled 2024-01-29 (×3): qty 1

## 2024-01-29 MED ORDER — ALUM & MAG HYDROXIDE-SIMETH 200-200-20 MG/5ML PO SUSP: 30.0000 mL | ORAL | Status: DC | PRN

## 2024-01-29 MED ORDER — RISPERIDONE 3 MG PO TABS
3.0000 mg | ORAL_TABLET | Freq: Two times a day (BID) | ORAL | Status: DC
  Administered 2024-01-29 – 2024-01-31 (×5): 3 mg via ORAL
  Filled 2024-01-29 (×5): qty 1

## 2024-01-29 MED ORDER — ACETAMINOPHEN 325 MG PO TABS
650.0000 mg | ORAL_TABLET | Freq: Four times a day (QID) | ORAL | Status: DC | PRN
  Administered 2024-01-31: 650 mg via ORAL
  Filled 2024-01-29: qty 2

## 2024-01-29 MED ORDER — DM-GUAIFENESIN ER 30-600 MG PO TB12
1.0000 | ORAL_TABLET | Freq: Two times a day (BID) | ORAL | Status: DC | PRN
Start: 2024-01-29 — End: 2024-01-31
  Administered 2024-01-30: 1 via ORAL
  Filled 2024-01-29: qty 1

## 2024-01-29 MED ORDER — BENZONATATE 100 MG PO CAPS
100.0000 mg | ORAL_CAPSULE | Freq: Two times a day (BID) | ORAL | Status: DC | PRN
  Administered 2024-01-29 – 2024-01-30 (×2): 100 mg via ORAL
  Filled 2024-01-29 (×2): qty 1

## 2024-01-29 MED ORDER — BENAZEPRIL HCL 20 MG PO TABS
10.0000 mg | ORAL_TABLET | Freq: Every day | ORAL | Status: DC
  Administered 2024-01-29 – 2024-01-31 (×3): 10 mg via ORAL
  Filled 2024-01-29 (×3): qty 1

## 2024-01-29 MED ORDER — HALOPERIDOL LACTATE 5 MG/ML IJ SOLN: 10.0000 mg | Freq: Three times a day (TID) | INTRAMUSCULAR | Status: DC | PRN

## 2024-01-29 NOTE — ED Notes (Addendum)
 MHT found chewing tobacco in a plastic cintainer on pt bed. When writer want to speak with pt on how he was able to bring the tobacco to the unit, pt disclosed that he wrapped the tobacco in paper towel, placed it in an emesis bag and was spitting into the bag when he arrived on the unit. Pt was transferred from [facility] arrived in the [facility] unit with the emesis bag, complained of nauseous and using the bag, spitting in, had paper napkins in the bag.  Tonight after the incident, skin assessment was completely done on pt again and pt room was thoroughly searched by Clinical research associate and MHT Esperanza Richters. Pt apologized for his action and remain on the unit. Will continue to monitor for safety and provide support.
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
## 2024-01-29 NOTE — ED Notes (Addendum)
 Pt admitted directly to [facility] from [facility]. Pt reports hearing voices in which he can not understand what they are saying, the words are scrambled, pt report seeing angels with wings as well. Pt is oriented to the unit and provided with meal.. Pt denies SI/HI but endorses AVH. Will continue to monitor for safety and provide support.
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
## 2024-01-29 NOTE — ED Notes (Signed)
Patient observed resting quietly, eyes closed. Respirations equal and unlabored. Will continue to monitor for safety.  

## 2024-01-29 NOTE — ED Notes (Signed)
 During environmental rounds, writer found a can of tobacco on patients bed. RN was notified. RN spoke with patient and tobacco was placed in patients locker.

## 2024-01-29 NOTE — ED Notes (Addendum)
 Patient A&Ox4.  Denies SI/HI. States he last thought about SI last night. Denies having a plan. Pt endorses auditory hallucinations. States he can not tell what they are saying. Pt endorses visual hallucinations of seeing demons. Patient denies any physical complaints. No acute distress observed. Routine safety checks conducted according to facility protocol. Patient agreed to notify staff if thoughts of harm toward self or others arise. Will continue to monitor for safety.

## 2024-01-29 NOTE — ED Notes (Signed)
 Pt is in the dayroom watching TV with peers. Pt denies SI/HI/AVH. Pt has no further complain.No acute distress noted. Will continue to monitor for safety and provide support.

## 2024-01-30 ENCOUNTER — Encounter (HOSPITAL_COMMUNITY): Payer: Self-pay | Admitting: Nurse Practitioner

## 2024-01-30 DIAGNOSIS — F200 Paranoid schizophrenia: Secondary | ICD-10-CM | POA: Diagnosis not present

## 2024-01-30 MED ORDER — NICOTINE 14 MG/24HR TD PT24
14.0000 mg | MEDICATED_PATCH | Freq: Every day | TRANSDERMAL | Status: DC
  Administered 2024-01-30 – 2024-01-31 (×2): 14 mg via TRANSDERMAL
  Filled 2024-01-30 (×2): qty 1

## 2024-01-30 NOTE — ED Notes (Signed)
 Patient is sleeping. Respirations equal and unlabored, skin warm and dry. No change in assessment or acuity. Routine safety checks conducted according to facility protocol. Will continue to monitor for safety.

## 2024-01-30 NOTE — Tx Team (Addendum)
 LCSW went and spoke with patient at bedside to address current mental state and reason for admission.  Patient reports he admitted into the [facility] due to auditory hallucinations.  Patient denies these voices telling him to hurt himself or anyone else.  Patient reports the voices will tell him to "tie his shoe, pick up a rag, do this or that" and patient reports he would just listen to what was commanded.  Patient reports being diagnosed with schizophrenia paranoia since high school. Patient reports a history of two inpatient admissions back in 2016 due to him attempting to harm himself via cutting his wrist. Patient denies any other admissions outside of the two mention in 2016.  Patient reports he is currently receiving outpatient therapy and medication management at [facility].  Patient reports his plan is to continue those outpatient services once he is stable for discharge.  Patient reports he currently resides in [place] alone. Patient declines collateral information being obtained by anyone at this time.  Patient reports his goal is to get stabilized on his medication and return back home. Patient aware that team is present for additional support as needed. Patient expressed appreciation. No other needs to report at this time.   Aftercare appointments have been arranged and updated in his AVS for review. Therapy with Cyril Loosen on 03/01/2024 at 8:00am and Medication management with MD Arfeen on 04/05/2024 at 9:00am. Patient to be made aware of appts.   Fernande Boyden, LCSW Clinical Social Worker [facility] Ph: [phone]

## 2024-01-30 NOTE — ED Notes (Signed)
 Patient sitting in dayroom interacting with peers. No acute distress noted. No concerns voiced. Informed patient to notify staff with any needs or assistance. Patient verbalized understanding or agreement. Safety checks in place per facility policy.

## 2024-01-30 NOTE — ED Notes (Signed)
 Pt in the bed sleeping. NAD. Respirations are even and unlabored. Will continue to monitor for safety.

## 2024-01-30 NOTE — ED Notes (Signed)
 Pt BP is 143/93. NP Shalon notified.

## 2024-01-30 NOTE — ED Notes (Signed)
 Patient alert & oriented x4. Denies intent to harm self or others when asked. Endorses VH of shadows and "glimmers of things" as well as CAH of voices telling him to "do weird stuff like bring tobacco up here". Patient denies CAH to harm self or others and states if that occurs they will come speak with staff. Patient reports hallucinations make them fearful. Writer provided emotional support and encouraged patient to come speak with staff at any point they would like further support. Patient denies any physical complaints when asked. No acute distress noted. Support and encouragement provided. Routine safety checks conducted per facility protocol. Encouraged patient to notify staff if any thoughts of harm towards self or others arise. Patient verbalizes understanding and agreement.

## 2024-01-30 NOTE — ED Notes (Signed)
 Pt is in the bedroom calm and composed. Comfortable in bed. Will monitor for safety.

## 2024-01-30 NOTE — ED Notes (Signed)
 PRN Mucinex DM and Tessalon given due to patient reports of cough and congestion. Medication administered with no complications. Environment secured, safety checks in place per facility policy.

## 2024-01-30 NOTE — ED Notes (Signed)
 Patient reports congestion. Patient received Mucinex DM at 1233 this shift and per patient medication did help however congestion has returned. Per orders Mucinex DM cannot be given again at this time. Providers Rex Kras, MD and [facility] made aware. Patient in no acute distress. Environment secured, safety checks in place per facility policy.

## 2024-01-30 NOTE — Group Note (Signed)
 Group Topic: Emotional Regulation  Group Date: 01/30/2024 Start Time: 1205 End Time: 1230 Facilitators: Concha Norway, NT  Department: [facility]  Number of Participants: 4  Group Focus: acceptance Treatment Modality:  Eclectic Therapy Interventions utilized were clarification Purpose: express irrational fears  Name: Damon [name] Date of Birth: [birth year]  MR: [id number]    Level of Participation: minimal Quality of Participation: attentive Interactions with others:   Mood/Affect: appropriate Triggers (if applicable):   Cognition: concrete Progress: Moderate Response:   Plan: follow-up needed  Patients Problems:  Patient Active Problem List   Diagnosis Date Noted   Noncompliance with medication regimen 01/28/2024   HTN (hypertension) 09/12/2023   Auditory hallucinations 10/10/2022   Concussion with < 1 hr loss of consciousness 07/26/2022   Paranoid schizophrenia (HCC)

## 2024-01-30 NOTE — Group Note (Signed)
 Group Topic: Social Support  Group Date: 01/30/2024 Start Time: 1030 End Time: 1106 Facilitators: Kamaury Cutbirth, Jacklynn Barnacle, RN  Department: [facility]  Number of Participants: 6  Group Focus: abuse issues Treatment Modality:  Interpersonal Therapy Interventions utilized were exploration and support Purpose: improve communication skills  Name: Damon [name] Date of Birth: 10/05/1966  MR: [id number]    Level of Participation: moderate Quality of Participation: attentive and cooperative Interactions with others: gave feedback Mood/Affect: appropriate Triggers (if applicable): None noted Cognition: coherent/clear and logical Progress: Gaining insight Response: Patient actively participated in group Plan: patient will be encouraged to continue to attend groups  Patients Problems:  Patient Active Problem List   Diagnosis Date Noted   Noncompliance with medication regimen 01/28/2024   HTN (hypertension) 09/12/2023   Auditory hallucinations 10/10/2022   Concussion with < 1 hr loss of consciousness 07/26/2022   Paranoid schizophrenia (HCC)

## 2024-01-30 NOTE — ED Notes (Signed)
 Patient sitting in dayroom interacting with peers. Attending AA group. No acute distress noted. No concerns voiced. Informed patient to notify staff with any needs or assistance. Patient verbalized understanding or agreement. Safety checks in place per facility policy.
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
## 2024-01-31 DIAGNOSIS — F200 Paranoid schizophrenia: Secondary | ICD-10-CM | POA: Diagnosis not present

## 2024-01-31 MED ORDER — RISPERIDONE 3 MG PO TABS: 3.0000 mg | ORAL_TABLET | Freq: Two times a day (BID) | ORAL | 0 refills | Status: DC

## 2024-01-31 MED ORDER — BENZONATATE 100 MG PO CAPS: 100.0000 mg | ORAL_CAPSULE | Freq: Two times a day (BID) | ORAL | 0 refills | Status: DC | PRN

## 2024-01-31 MED ORDER — TRAZODONE HCL 50 MG PO TABS: 50.0000 mg | ORAL_TABLET | Freq: Every day | ORAL | 0 refills | Status: DC

## 2024-01-31 MED ORDER — AMLODIPINE BESYLATE 10 MG PO TABS: 10.0000 mg | ORAL_TABLET | Freq: Every day | ORAL | 0 refills | Status: DC

## 2024-01-31 MED ORDER — DM-GUAIFENESIN ER 30-600 MG PO TB12: 1.0000 | ORAL_TABLET | Freq: Two times a day (BID) | ORAL | Status: DC | PRN

## 2024-01-31 MED ORDER — BENAZEPRIL HCL 10 MG PO TABS: 10.0000 mg | ORAL_TABLET | Freq: Every day | ORAL | 0 refills | Status: DC

## 2024-01-31 MED ORDER — NICOTINE 14 MG/24HR TD PT24: 14.0000 mg | MEDICATED_PATCH | Freq: Every day | TRANSDERMAL | 0 refills | Status: DC

## 2024-01-31 NOTE — Discharge Instructions (Addendum)
 Patient reports he is currently receiving outpatient therapy and medication management at [facility].  Patient reports his plan is to continue those outpatient services once he is stable for discharge.  Patient reports he currently resides in [place] alone. Patient declines collateral information being obtained by anyone at this time.  Patient reports his goal is to get stabilized on his medication and return back home. Patient aware that team is present for additional support as needed. Patient expressed appreciation. No other needs to report at this time.    Aftercare appointments have been arranged and updated in his AVS for review. Therapy with Cyril Loosen on 03/01/2024 at 8:00am and Medication management with MD Arfeen on 04/05/2024 at 9:00am. Patient to be made aware of appts.

## 2024-01-31 NOTE — Group Note (Signed)
 Group Topic: Communication  Group Date: 01/30/2024 Start Time: 2000 End Time: 2015 Facilitators: Lauro Symantha Steeber, NT  Department: [facility]  Number of Participants: 5 Group Focus: clarity of thought and communication Treatment Modality:  Cognitive Behavioral Therapy Interventions utilized were clarification Purpose: express feelings and improve communication skills  Name: Damon [name] Date of Birth: [birth year]  MR: [id number]    Level of Participation: PT DID NOT ATTEND GROUP Quality of Participation: attentive and cooperative Interactions with others: gave feedback Mood/Affect: appropriate Triggers (if applicable): N/A Cognition: coherent/clear Progress: Gaining insight Response: N/A Plan: patient will be encouraged to attend group sessions.  Patients Problems:  Patient Active Problem List   Diagnosis Date Noted   Noncompliance with medication regimen 01/28/2024   HTN (hypertension) 09/12/2023   Auditory hallucinations 10/10/2022   Concussion with < 1 hr loss of consciousness 07/26/2022   Paranoid schizophrenia (HCC)

## 2024-01-31 NOTE — ED Provider Notes (Signed)
 FBC/OBS ASAP Discharge Summary  Date and Time: 01/31/2024 10:57 AM  Name: Damon [name]  MRN:  [id number]   Discharge Diagnoses:  Final diagnoses:  Paranoid schizophrenia (HCC)  Auditory hallucinations    Subjective: ***  Stay Summary: ***  Total Time spent with patient: {Time; 15 min - 8 hours:17441}  Past Psychiatric History: *** Past Medical History: *** Family History: *** Family Psychiatric History: *** Social History: *** Tobacco Cessation:  {Discharge tobacco cessation prescription:304700209}  Current Medications:  Current Facility-Administered Medications  Medication Dose Route Frequency Provider Last Rate Last Admin   acetaminophen (TYLENOL) tablet 650 mg  650 mg Oral Q6H PRN Eligha Bridegroom, NP   650 mg at 01/31/24 0828   alum & mag hydroxide-simeth (MAALOX/MYLANTA) 200-200-20 MG/5ML suspension 30 mL  30 mL Oral Q4H PRN Eligha Bridegroom, NP       amLODipine (NORVASC) tablet 10 mg  10 mg Oral Daily Eligha Bridegroom, NP   10 mg at 01/31/24 0829   benazepril (LOTENSIN) tablet 10 mg  10 mg Oral Daily Eligha Bridegroom, NP   10 mg at 01/31/24 4098   benzonatate (TESSALON) capsule 100 mg  100 mg Oral BID PRN Lamar Sprinkles, MD   100 mg at 01/30/24 1233   dextromethorphan-guaiFENesin (MUCINEX DM) 30-600 MG per 12 hr tablet 1 tablet  1 tablet Oral BID PRN Lamar Sprinkles, MD   1 tablet at 01/30/24 1233   haloperidol (HALDOL) tablet 5 mg  5 mg Oral TID PRN Eligha Bridegroom, NP       And   diphenhydrAMINE (BENADRYL) capsule 50 mg  50 mg Oral TID PRN Eligha Bridegroom, NP       haloperidol lactate (HALDOL) injection 5 mg  5 mg Intramuscular TID PRN Eligha Bridegroom, NP       And   diphenhydrAMINE (BENADRYL) injection 50 mg  50 mg Intramuscular TID PRN Eligha Bridegroom, NP       And   LORazepam (ATIVAN) injection 2 mg  2 mg Intramuscular TID PRN Eligha Bridegroom, NP       haloperidol lactate (HALDOL) injection 10 mg  10 mg Intramuscular TID PRN Eligha Bridegroom, NP       And    diphenhydrAMINE (BENADRYL) injection 50 mg  50 mg Intramuscular TID PRN Eligha Bridegroom, NP       And   LORazepam (ATIVAN) injection 2 mg  2 mg Intramuscular TID PRN Eligha Bridegroom, NP       magnesium hydroxide (MILK OF MAGNESIA) suspension 30 mL  30 mL Oral Daily PRN Eligha Bridegroom, NP       nicotine (NICODERM CQ - dosed in mg/24 hours) patch 14 mg  14 mg Transdermal Daily Bobbitt, Shalon E, NP   14 mg at 01/31/24 0831   risperiDONE (RISPERDAL) tablet 3 mg  3 mg Oral BID Eligha Bridegroom, NP   3 mg at 01/31/24 0829   traZODone (DESYREL) tablet 50 mg  50 mg Oral QHS Eligha Bridegroom, NP   50 mg at 01/30/24 2105   Current Outpatient Medications  Medication Sig Dispense Refill   amLODipine (NORVASC) 10 MG tablet Take 1 tablet (10 mg total) by mouth daily. 30 tablet 0   benazepril (LOTENSIN) 10 MG tablet Take 1 tablet (10 mg total) by mouth daily. 30 tablet 0   benzonatate (TESSALON) 100 MG capsule Take 1 capsule (100 mg total) by mouth 2 (two) times daily as needed for cough. 14 capsule 0   dextromethorphan-guaiFENesin (MUCINEX DM) 30-600 MG 12hr tablet Take  1 tablet by mouth 2 (two) times daily as needed for cough.     [START ON 02/01/2024] nicotine (NICODERM CQ - DOSED IN MG/24 HOURS) 14 mg/24hr patch Place 1 patch (14 mg total) onto the skin daily. 28 patch 0   risperiDONE (RISPERDAL) 3 MG tablet Take 1 tablet (3 mg total) by mouth 2 (two) times daily. 60 tablet 0   traZODone (DESYREL) 50 MG tablet Take 1 tablet (50mg ) by mouth daily AT BEDTIME for Insomnia 30 tablet 0    PTA Medications:  Facility Ordered Medications  Medication   amLODipine (NORVASC) tablet 10 mg   benazepril (LOTENSIN) tablet 10 mg   risperiDONE (RISPERDAL) tablet 3 mg   traZODone (DESYREL) tablet 50 mg   acetaminophen (TYLENOL) tablet 650 mg   alum & mag hydroxide-simeth (MAALOX/MYLANTA) 200-200-20 MG/5ML suspension 30 mL   magnesium hydroxide (MILK OF MAGNESIA) suspension 30 mL   haloperidol (HALDOL) tablet  5 mg   And   diphenhydrAMINE (BENADRYL) capsule 50 mg   haloperidol lactate (HALDOL) injection 5 mg   And   diphenhydrAMINE (BENADRYL) injection 50 mg   And   LORazepam (ATIVAN) injection 2 mg   haloperidol lactate (HALDOL) injection 10 mg   And   diphenhydrAMINE (BENADRYL) injection 50 mg   And   LORazepam (ATIVAN) injection 2 mg   dextromethorphan-guaiFENesin (MUCINEX DM) 30-600 MG per 12 hr tablet 1 tablet   benzonatate (TESSALON) capsule 100 mg   [COMPLETED] sodium chloride (OCEAN) 0.65 % nasal spray 1 spray   nicotine (NICODERM CQ - dosed in mg/24 hours) patch 14 mg   PTA Medications  Medication Sig   amLODipine (NORVASC) 10 MG tablet Take 1 tablet (10 mg total) by mouth daily.   dextromethorphan-guaiFENesin (MUCINEX DM) 30-600 MG 12hr tablet Take 1 tablet by mouth 2 (two) times daily as needed for cough.   benzonatate (TESSALON) 100 MG capsule Take 1 capsule (100 mg total) by mouth 2 (two) times daily as needed for cough.   [START ON 02/01/2024] nicotine (NICODERM CQ - DOSED IN MG/24 HOURS) 14 mg/24hr patch Place 1 patch (14 mg total) onto the skin daily.   risperiDONE (RISPERDAL) 3 MG tablet Take 1 tablet (3 mg total) by mouth 2 (two) times daily.   traZODone (DESYREL) 50 MG tablet Take 1 tablet (50mg ) by mouth daily AT BEDTIME for Insomnia   benazepril (LOTENSIN) 10 MG tablet Take 1 tablet (10 mg total) by mouth daily.       01/31/2024   10:53 AM 01/29/2024    6:50 AM 01/13/2023    9:19 AM  Depression screen PHQ 2/9  Decreased Interest 0 1 2  Down, Depressed, Hopeless 0 1 3  PHQ - 2 Score 0 2 5  Altered sleeping 0 0 3  Tired, decreased energy 1 0 3  Change in appetite 0 0 3  Feeling bad or failure about yourself  0 0 2  Trouble concentrating 1 1 2   Moving slowly or fidgety/restless 0 0 3  Suicidal thoughts 0 0 0  PHQ-9 Score 2 3 21   Difficult doing work/chores Not difficult at all Somewhat difficult Extremely dIfficult    Flowsheet Row ED from 01/29/2024 in  [facility] ED from 01/28/2024 in [facility] Emergency Department at [facility] Admission (Discharged) from 09/10/2023 in [facility] INPATIENT BEHAVIORAL MEDICINE  C-SSRS RISK CATEGORY No Risk No Risk Low Risk       Musculoskeletal  Strength & Muscle Tone: {desc; muscle tone:32375} Gait &  Station: {PE GAIT ED VQQV:95638} Patient leans: {Patient Leans:21022755}  Psychiatric Specialty Exam  Presentation  General Appearance:  Appropriate for Environment  Eye Contact: Good  Speech: Clear and Coherent; Normal Rate  Speech Volume: Normal  Handedness: -- (not assessed)   Mood and Affect  Mood: -- ("Better")  Affect: Congruent; Full Range   Thought Process  Thought Processes: Linear  Descriptions of Associations:Intact  Orientation:None  Thought Content:Logical  Diagnosis of Schizophrenia or Schizoaffective disorder in past: No    Hallucinations:Hallucinations: Auditory Description of Auditory Hallucinations: hearing voices telling him the food is poisoned this morning  Ideas of Reference:Other (comment) (thought insertion; thought broadcasting)  Suicidal Thoughts:Suicidal Thoughts: No  Homicidal Thoughts:Homicidal Thoughts: No   Sensorium  Memory: Immediate Good; Recent Good; Remote Good  Judgment: Fair  Insight: Fair   Chartered certified accountant: Fair  Attention Span: Fair  Recall: [employer] of Knowledge: Fair  Language: Fair   Psychomotor Activity  Psychomotor Activity: Psychomotor Activity: Normal   Assets  Assets: Desire for Improvement; Resilience; Communication Skills   Sleep  Sleep: Sleep: Fair   Nutritional Assessment (For OBS and FBC admissions only) Has the patient had a weight loss or gain of 10 pounds or more in the last 3 months?: No Has the patient had a decrease in food intake/or appetite?: No Does the patient have dental problems?: No Does the patient have eating  habits or behaviors that may be indicators of an eating disorder including binging or inducing vomiting?: No Has the patient recently lost weight without trying?: 0 Has the patient been eating poorly because of a decreased appetite?: 0 Malnutrition Screening Tool Score: 0    Physical Exam  Physical Exam ROS Blood pressure (!) 154/96, pulse 93, temperature 98.4 F (36.9 C), temperature source Oral, resp. rate 18, SpO2 100%. There is no height or weight on file to calculate BMI.  Demographic Factors:  {Demographic Factors:20662}  Loss Factors: {Loss Factors:20659}  Historical Factors: {Historical Factors:20660}  Risk Reduction Factors:   {Risk Reduction Factors:20661}  Continued Clinical Symptoms:  {Clinical Factors:22706}  Cognitive Features That Contribute To Risk:  {chl bhh Cognitive Features:304700251}    Suicide Risk:  {BHH SUICIDE RISK:22704}  Plan Of Care/Follow-up recommendations:  {BHH DC FU RECOMMENDATIONS:22620}  Disposition: ***  Lorri Frederick, MD 01/31/2024, 10:57 AM

## 2024-01-31 NOTE — ED Notes (Signed)
 Patient A&O x 4, ambulatory. Patient discharged in no acute distress. Patient denied SI/HI, A/VH upon discharge. Patient verbalized understanding of all discharge instructions reviewed on AVS via staff, to include follow up appointments, RX's and safety. Suicide safety plan completed and reviewed with Clinical research associate. A copy given to pt. Patient reported mood 10/10.  Pt belongings returned to patient from locker #27 intact. Patient escorted to lobby via staff with bus pass in hand. Safety maintained.

## 2024-01-31 NOTE — ED Notes (Signed)
 Pt in the bed sleeping. NAD. Respirations are even and unlabored. Will continue to monitor for safety.

## 2024-01-31 NOTE — ED Notes (Signed)
 Pt resting quietly in bed at the current, breathing is even and unlabored. No s/s of distress noted or voiced. Pt denies SI, HI, and AVH at this time. C/o back pain, PRN rx administered. Will continue to monitor for safety and report any changes.

## 2024-01-31 NOTE — ED Notes (Signed)
 Patient was provided breakfast

## 2024-03-01 ENCOUNTER — Ambulatory Visit (INDEPENDENT_AMBULATORY_CARE_PROVIDER_SITE_OTHER): Admitting: Licensed Clinical Social Worker

## 2024-03-01 DIAGNOSIS — Z91199 Patient's noncompliance with other medical treatment and regimen due to unspecified reason: Secondary | ICD-10-CM

## 2024-03-01 NOTE — Progress Notes (Signed)
 THERAPIST PROGRESS NOTE   Session Date: 03/01/2024  Session Time: 0800  Patient no-showed today's appointment; appointment was for intake to establish ongoing care.   Patsi Boots, MSW, LCSW 03/01/2024,  12:07 PM

## 2024-04-05 ENCOUNTER — Ambulatory Visit (HOSPITAL_COMMUNITY): Admitting: Psychiatry

## 2024-05-10 ENCOUNTER — Ambulatory Visit (HOSPITAL_COMMUNITY)
Admission: EM | Admit: 2024-05-10 | Discharge: 2024-05-11 | Disposition: A | Discharge status: Other Acute Inpatient Hospital | Payer: Medicare (Managed Care) | Attending: Psychiatry | Admitting: Psychiatry

## 2024-05-10 ENCOUNTER — Inpatient Hospital Stay: Admission: RE | Admit: 2024-05-10 | Payer: Medicare (Managed Care) | Source: Intra-hospital | Admitting: Psychiatry

## 2024-05-10 DIAGNOSIS — F17210 Nicotine dependence, cigarettes, uncomplicated: Secondary | ICD-10-CM

## 2024-05-10 DIAGNOSIS — Z5971 Insufficient health insurance coverage: Secondary | ICD-10-CM | POA: Diagnosis not present

## 2024-05-10 DIAGNOSIS — Z9151 Personal history of suicidal behavior: Secondary | ICD-10-CM | POA: Diagnosis not present

## 2024-05-10 DIAGNOSIS — Z6379 Other stressful life events affecting family and household: Secondary | ICD-10-CM | POA: Insufficient documentation

## 2024-05-10 DIAGNOSIS — Z79899 Other long term (current) drug therapy: Secondary | ICD-10-CM | POA: Insufficient documentation

## 2024-05-10 DIAGNOSIS — F200 Paranoid schizophrenia: Secondary | ICD-10-CM | POA: Insufficient documentation

## 2024-05-10 DIAGNOSIS — F323 Major depressive disorder, single episode, severe with psychotic features: Secondary | ICD-10-CM | POA: Diagnosis present

## 2024-05-10 DIAGNOSIS — R45851 Suicidal ideations: Secondary | ICD-10-CM | POA: Diagnosis present

## 2024-05-10 DIAGNOSIS — I10 Essential (primary) hypertension: Secondary | ICD-10-CM

## 2024-05-10 LAB — POCT URINE DRUG SCREEN - MANUAL ENTRY (I-SCREEN)
POC Amphetamine UR: NOT DETECTED
POC Buprenorphine (BUP): NOT DETECTED
POC Cocaine UR: NOT DETECTED
POC Marijuana UR: NOT DETECTED
POC Methadone UR: NOT DETECTED
POC Methamphetamine UR: NOT DETECTED
POC Morphine: NOT DETECTED
POC Oxazepam (BZO): NOT DETECTED
POC Oxycodone UR: NOT DETECTED
POC Secobarbital (BAR): NOT DETECTED

## 2024-05-10 LAB — COMPREHENSIVE METABOLIC PANEL WITH GFR
ALT: 32 U/L (ref 0–44)
AST: 31 U/L (ref 15–41)
Albumin: 4.3 g/dL (ref 3.5–5.0)
Alkaline Phosphatase: 36 U/L — ABNORMAL LOW (ref 38–126)
Anion gap: 11 (ref 5–15)
BUN: 8 mg/dL (ref 6–20)
CO2: 23 mmol/L (ref 22–32)
Calcium: 10.1 mg/dL (ref 8.9–10.3)
Chloride: 104 mmol/L (ref 98–111)
Creatinine, Ser: 1.14 mg/dL (ref 0.61–1.24)
GFR, Estimated: >60 mL/min (ref >60)
Glucose, Bld: 79 mg/dL (ref 70–99)
Potassium: 4.9 mmol/L (ref 3.5–5.1)
Sodium: 138 mmol/L (ref 135–145)
Total Bilirubin: 0.7 mg/dL (ref 0.0–1.2)
Total Protein: 7.4 g/dL (ref 6.5–8.1)

## 2024-05-10 LAB — LIPID PANEL
Cholesterol: 221 mg/dL — ABNORMAL HIGH (ref 0–200)
HDL: 97 mg/dL (ref >40)
LDL Cholesterol: 107 mg/dL — ABNORMAL HIGH (ref 0–99)
Total CHOL/HDL Ratio: 2.3 ratio
Triglycerides: 86 mg/dL (ref <150)
VLDL: 17 mg/dL (ref 0–40)

## 2024-05-10 LAB — CBC WITH DIFFERENTIAL/PLATELET
Abs Immature Granulocytes: 0.01 10*3/uL (ref 0.00–0.07)
Basophils Absolute: 0 10*3/uL (ref 0.0–0.1)
Basophils Relative: 1 %
Eosinophils Absolute: 0.1 10*3/uL (ref 0.0–0.5)
Eosinophils Relative: 2 %
HCT: 41 % (ref 39.0–52.0)
Hemoglobin: 13.8 g/dL (ref 13.0–17.0)
Immature Granulocytes: 0 %
Lymphocytes Relative: 35 %
Lymphs Abs: 1.6 10*3/uL (ref 0.7–4.0)
MCH: 31.2 pg (ref 26.0–34.0)
MCHC: 33.7 g/dL (ref 30.0–36.0)
MCV: 92.6 fL (ref 80.0–100.0)
Monocytes Absolute: 0.5 10*3/uL (ref 0.1–1.0)
Monocytes Relative: 11 %
Neutro Abs: 2.2 10*3/uL (ref 1.7–7.7)
Neutrophils Relative %: 51 %
Platelets: 274 10*3/uL (ref 150–400)
RBC: 4.43 MIL/uL (ref 4.22–5.81)
RDW: 13 % (ref 11.5–15.5)
WBC: 4.4 10*3/uL (ref 4.0–10.5)
nRBC: 0 % (ref 0.0–0.2)

## 2024-05-10 LAB — HEMOGLOBIN A1C
Hgb A1c MFr Bld: 5.5 % (ref 4.8–5.6)
Mean Plasma Glucose: 111.15 mg/dL

## 2024-05-10 LAB — VALPROIC ACID LEVEL: Valproic Acid Lvl: <10 ug/mL — ABNORMAL LOW (ref 50–100)

## 2024-05-10 LAB — ETHANOL: Alcohol, Ethyl (B): <15 mg/dL (ref <15)

## 2024-05-10 LAB — TSH: TSH: 0.928 u[IU]/mL (ref 0.350–4.500)

## 2024-05-10 MED ORDER — BENZTROPINE MESYLATE 1 MG PO TABS
1.0000 mg | ORAL_TABLET | Freq: Two times a day (BID) | ORAL | Status: DC
  Administered 2024-05-10 (×2): 1 mg via ORAL
  Filled 2024-05-10 (×2): qty 1

## 2024-05-10 MED ORDER — HALOPERIDOL LACTATE 5 MG/ML IJ SOLN: 10.0000 mg | Freq: Three times a day (TID) | INTRAMUSCULAR | Status: DC | PRN

## 2024-05-10 MED ORDER — HALOPERIDOL 5 MG PO TABS: 5.0000 mg | ORAL_TABLET | Freq: Three times a day (TID) | ORAL | Status: DC | PRN

## 2024-05-10 MED ORDER — NICOTINE 21 MG/24HR TD PT24: 21.0000 mg | MEDICATED_PATCH | Freq: Every day | TRANSDERMAL | Status: DC

## 2024-05-10 MED ORDER — HALOPERIDOL LACTATE 5 MG/ML IJ SOLN: 5.0000 mg | Freq: Three times a day (TID) | INTRAMUSCULAR | Status: DC | PRN

## 2024-05-10 MED ORDER — DIPHENHYDRAMINE HCL 50 MG/ML IJ SOLN: 50.0000 mg | Freq: Three times a day (TID) | INTRAMUSCULAR | Status: DC | PRN

## 2024-05-10 MED ORDER — NICOTINE 14 MG/24HR TD PT24
14.0000 mg | MEDICATED_PATCH | Freq: Every day | TRANSDERMAL | Status: DC
  Administered 2024-05-10: 14 mg via TRANSDERMAL

## 2024-05-10 MED ORDER — LORAZEPAM 2 MG/ML IJ SOLN: 2.0000 mg | Freq: Three times a day (TID) | INTRAMUSCULAR | Status: DC | PRN

## 2024-05-10 MED ORDER — TRAZODONE HCL 50 MG PO TABS: 50.0000 mg | ORAL_TABLET | Freq: Every evening | ORAL | Status: DC | PRN

## 2024-05-10 MED ORDER — ALUM & MAG HYDROXIDE-SIMETH 200-200-20 MG/5ML PO SUSP: 30.0000 mL | ORAL | Status: DC | PRN

## 2024-05-10 MED ORDER — RISPERIDONE 1 MG PO TABS
1.5000 mg | ORAL_TABLET | Freq: Two times a day (BID) | ORAL | Status: DC
  Administered 2024-05-10 (×2): 1.5 mg via ORAL
  Filled 2024-05-10 (×2): qty 1

## 2024-05-10 MED ORDER — MAGNESIUM HYDROXIDE 400 MG/5ML PO SUSP: 30.0000 mL | Freq: Every day | ORAL | Status: DC | PRN

## 2024-05-10 MED ORDER — DIPHENHYDRAMINE HCL 50 MG PO CAPS: 50.0000 mg | ORAL_CAPSULE | Freq: Three times a day (TID) | ORAL | Status: DC | PRN

## 2024-05-10 MED ORDER — HYDROXYZINE HCL 25 MG PO TABS: 50.0000 mg | ORAL_TABLET | Freq: Three times a day (TID) | ORAL | Status: DC | PRN

## 2024-05-10 MED ORDER — ACETAMINOPHEN 325 MG PO TABS: 650.0000 mg | ORAL_TABLET | Freq: Four times a day (QID) | ORAL | Status: DC | PRN

## 2024-05-10 MED ORDER — AMLODIPINE BESYLATE 10 MG PO TABS
10.0000 mg | ORAL_TABLET | Freq: Every day | ORAL | Status: DC
  Administered 2024-05-10: 10 mg via ORAL
  Filled 2024-05-10: qty 1

## 2024-05-10 MED ORDER — AMLODIPINE BESYLATE 10 MG PO TABS: 10.0000 mg | ORAL_TABLET | Freq: Every day | ORAL | Status: DC

## 2024-05-10 MED ORDER — BENAZEPRIL HCL 5 MG PO TABS
10.0000 mg | ORAL_TABLET | Freq: Every day | ORAL | Status: DC
  Administered 2024-05-10: 10 mg via ORAL
  Filled 2024-05-10: qty 2

## 2024-05-10 MED ORDER — BENAZEPRIL HCL 5 MG PO TABS: 10.0000 mg | ORAL_TABLET | Freq: Every day | ORAL | Status: DC

## 2024-05-10 NOTE — Progress Notes (Signed)
 Pt was accepted to [facility] TODAY 05/09/2024 Bed assignment: 322  Pt meets inpatient criteria per: Tosin Olasunkanmi, NP   Attending Physician will az:[id number]   Report can be called to: [phone]  Pt can arrive after discharges   Care Team Notified: [facility] RN,Demeteria Ravenell  RN  [place] Letina Luckett LCSW-A   05/10/2024 2:36 PM

## 2024-05-10 NOTE — ED Notes (Signed)
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
## 2024-05-10 NOTE — ED Notes (Signed)
 Pt reports history of depression and mental health issues that were exacerbated by his daughter recently being placed into DSS custody. Pt was searched with no contraband found.  Labs and EKG performed.  Pt was escorted onto the unit and given lunch tray.  Staff will cont to monitor for safety.

## 2024-05-10 NOTE — ED Notes (Signed)
 Pt is awake and alert. Standing at nursing station and given salad and juice.  No distress noted.
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
## 2024-05-10 NOTE — ED Notes (Signed)
Pt resting quietly.  Breathing even and unlabored. In view of nursing station.  
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
## 2024-05-11 ENCOUNTER — Other Ambulatory Visit: Payer: Self-pay

## 2024-05-11 ENCOUNTER — Inpatient Hospital Stay
Admission: AD | Admit: 2024-05-11 | Discharge: 2024-05-18 | DRG: 885 | Disposition: A | Discharge status: Home / Self Care | Payer: Medicare (Managed Care) | Source: Intra-hospital | Attending: Psychiatry | Admitting: Psychiatry

## 2024-05-11 ENCOUNTER — Encounter: Payer: Self-pay | Admitting: Psychiatry

## 2024-05-11 DIAGNOSIS — Z9151 Personal history of suicidal behavior: Secondary | ICD-10-CM

## 2024-05-11 DIAGNOSIS — Z800 Family history of malignant neoplasm of digestive organs: Secondary | ICD-10-CM

## 2024-05-11 DIAGNOSIS — Z8249 Family history of ischemic heart disease and other diseases of the circulatory system: Secondary | ICD-10-CM

## 2024-05-11 DIAGNOSIS — Z6823 Body mass index (BMI) 23.0-23.9, adult: Secondary | ICD-10-CM

## 2024-05-11 DIAGNOSIS — R634 Abnormal weight loss: Secondary | ICD-10-CM | POA: Diagnosis present

## 2024-05-11 DIAGNOSIS — Z91148 Patient's other noncompliance with medication regimen for other reason: Secondary | ICD-10-CM

## 2024-05-11 DIAGNOSIS — Z823 Family history of stroke: Secondary | ICD-10-CM | POA: Diagnosis not present

## 2024-05-11 DIAGNOSIS — R519 Headache, unspecified: Secondary | ICD-10-CM | POA: Diagnosis present

## 2024-05-11 DIAGNOSIS — I10 Essential (primary) hypertension: Secondary | ICD-10-CM | POA: Diagnosis present

## 2024-05-11 DIAGNOSIS — Z79899 Other long term (current) drug therapy: Secondary | ICD-10-CM

## 2024-05-11 DIAGNOSIS — F200 Paranoid schizophrenia: Principal | ICD-10-CM | POA: Diagnosis present

## 2024-05-11 DIAGNOSIS — Z83438 Family history of other disorder of lipoprotein metabolism and other lipidemia: Secondary | ICD-10-CM | POA: Diagnosis not present

## 2024-05-11 DIAGNOSIS — Z604 Social exclusion and rejection: Secondary | ICD-10-CM | POA: Diagnosis present

## 2024-05-11 DIAGNOSIS — F32A Depression, unspecified: Secondary | ICD-10-CM | POA: Diagnosis present

## 2024-05-11 DIAGNOSIS — Z91018 Allergy to other foods: Secondary | ICD-10-CM | POA: Diagnosis not present

## 2024-05-11 DIAGNOSIS — Z888 Allergy status to other drugs, medicaments and biological substances status: Secondary | ICD-10-CM | POA: Diagnosis not present

## 2024-05-11 DIAGNOSIS — Z833 Family history of diabetes mellitus: Secondary | ICD-10-CM | POA: Diagnosis not present

## 2024-05-11 DIAGNOSIS — R45851 Suicidal ideations: Secondary | ICD-10-CM | POA: Diagnosis present

## 2024-05-11 DIAGNOSIS — F17210 Nicotine dependence, cigarettes, uncomplicated: Secondary | ICD-10-CM | POA: Diagnosis present

## 2024-05-11 LAB — PROLACTIN: Prolactin: 4.6 ng/mL (ref 3.6–25.2)

## 2024-05-11 MED ORDER — MAGNESIUM HYDROXIDE 400 MG/5ML PO SUSP: 30.0000 mL | Freq: Every day | ORAL | Status: DC | PRN

## 2024-05-11 MED ORDER — RISPERIDONE 1 MG PO TABS
1.5000 mg | ORAL_TABLET | Freq: Two times a day (BID) | ORAL | Status: AC
  Administered 2024-05-11 – 2024-05-17 (×14): 1.5 mg via ORAL
  Filled 2024-05-11 (×14): qty 2

## 2024-05-11 MED ORDER — NICOTINE 21 MG/24HR TD PT24
21.0000 mg | MEDICATED_PATCH | Freq: Every day | TRANSDERMAL | Status: DC
  Administered 2024-05-11 – 2024-05-18 (×8): 21 mg via TRANSDERMAL
  Filled 2024-05-11 (×8): qty 1

## 2024-05-11 MED ORDER — HALOPERIDOL 5 MG PO TABS: 5.0000 mg | ORAL_TABLET | Freq: Three times a day (TID) | ORAL | Status: DC | PRN

## 2024-05-11 MED ORDER — ACETAMINOPHEN 325 MG PO TABS
650.0000 mg | ORAL_TABLET | Freq: Four times a day (QID) | ORAL | Status: DC | PRN
  Administered 2024-05-11 – 2024-05-14 (×5): 650 mg via ORAL
  Filled 2024-05-11 (×5): qty 2

## 2024-05-11 MED ORDER — HALOPERIDOL LACTATE 5 MG/ML IJ SOLN: 5.0000 mg | Freq: Three times a day (TID) | INTRAMUSCULAR | Status: DC | PRN

## 2024-05-11 MED ORDER — BENZTROPINE MESYLATE 1 MG PO TABS
1.0000 mg | ORAL_TABLET | Freq: Two times a day (BID) | ORAL | Status: AC
  Administered 2024-05-11 – 2024-05-17 (×14): 1 mg via ORAL
  Filled 2024-05-11 (×14): qty 1

## 2024-05-11 MED ORDER — ENSURE PLUS HIGH PROTEIN PO LIQD
237.0000 mL | Freq: Two times a day (BID) | ORAL | Status: DC
  Administered 2024-05-11 – 2024-05-18 (×13): 237 mL via ORAL

## 2024-05-11 MED ORDER — HYDROXYZINE HCL 50 MG PO TABS: 50.0000 mg | ORAL_TABLET | Freq: Three times a day (TID) | ORAL | Status: AC | PRN

## 2024-05-11 MED ORDER — AMLODIPINE BESYLATE 5 MG PO TABS
10.0000 mg | ORAL_TABLET | Freq: Every day | ORAL | Status: AC
  Administered 2024-05-11 – 2024-05-17 (×7): 10 mg via ORAL
  Filled 2024-05-11 (×7): qty 2

## 2024-05-11 MED ORDER — TRAZODONE HCL 50 MG PO TABS
50.0000 mg | ORAL_TABLET | Freq: Every day | ORAL | Status: AC
  Administered 2024-05-11 – 2024-05-17 (×7): 50 mg via ORAL
  Filled 2024-05-11 (×6): qty 1

## 2024-05-11 MED ORDER — ALUM & MAG HYDROXIDE-SIMETH 200-200-20 MG/5ML PO SUSP: 30.0000 mL | ORAL | Status: DC | PRN

## 2024-05-11 MED ORDER — BENAZEPRIL HCL 5 MG PO TABS
10.0000 mg | ORAL_TABLET | Freq: Every day | ORAL | Status: AC
  Administered 2024-05-11 – 2024-05-13 (×3): 10 mg via ORAL
  Administered 2024-05-14 (×2): 5 mg via ORAL
  Administered 2024-05-15 – 2024-05-16 (×2): 10 mg via ORAL
  Filled 2024-05-11: qty 2
  Filled 2024-05-11: qty 1
  Filled 2024-05-11 (×6): qty 2

## 2024-05-11 MED ORDER — DIPHENHYDRAMINE HCL 50 MG/ML IJ SOLN: 50.0000 mg | Freq: Three times a day (TID) | INTRAMUSCULAR | Status: DC | PRN

## 2024-05-11 MED ORDER — HALOPERIDOL LACTATE 5 MG/ML IJ SOLN: 10.0000 mg | Freq: Three times a day (TID) | INTRAMUSCULAR | Status: DC | PRN

## 2024-05-11 MED ORDER — DIPHENHYDRAMINE HCL 25 MG PO CAPS: 50.0000 mg | ORAL_CAPSULE | Freq: Three times a day (TID) | ORAL | Status: DC | PRN

## 2024-05-11 MED ORDER — ADULT MULTIVITAMIN W/MINERALS CH
1.0000 | ORAL_TABLET | Freq: Every day | ORAL | Status: DC
  Administered 2024-05-11: 1
  Filled 2024-05-11: qty 1

## 2024-05-11 MED ORDER — NICOTINE POLACRILEX 2 MG MT GUM
2.0000 mg | CHEWING_GUM | OROMUCOSAL | Status: DC | PRN
  Administered 2024-05-12 – 2024-05-13 (×2): 2 mg via ORAL
  Filled 2024-05-11 (×2): qty 1

## 2024-05-11 MED ORDER — ADULT MULTIVITAMIN W/MINERALS CH
1.0000 | ORAL_TABLET | Freq: Every day | ORAL | Status: DC
  Administered 2024-05-12 – 2024-05-18 (×7): 1 via ORAL
  Filled 2024-05-11 (×7): qty 1

## 2024-05-11 MED ORDER — LORAZEPAM 2 MG/ML IJ SOLN: 2.0000 mg | Freq: Three times a day (TID) | INTRAMUSCULAR | Status: DC | PRN

## 2024-05-11 NOTE — Plan of Care (Signed)
  Problem: Education: Goal: Knowledge of [facility] Education information/materials will improve Outcome: Progressing Goal: Mental status will improve Outcome: Progressing   Problem: Activity: Goal: Interest or engagement in activities will improve Outcome: Progressing   Problem: Coping: Goal: Ability to verbalize frustrations and anger appropriately will improve Outcome: Progressing   Problem: Safety: Goal: Periods of time without injury will increase Outcome: Progressing
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
## 2024-05-11 NOTE — Progress Notes (Signed)
 NUTRITION ASSESSMENT  Pt identified as at risk on the Malnutrition Screen Tool  INTERVENTION:  -Liberalize diet to regular -MVI with mineral daily -Ensure Plus High Protein po BID, each supplement provides 350 kcal and 20 grams of protein   NUTRITION DIAGNOSIS: Unintentional weight loss related to sub-optimal intake as evidenced by pt report.   Goal: Pt to meet >/= 90% of their estimated nutrition needs.  Monitor:  PO intake  Assessment:   Pt admitted with paranoid schizophrenia, essential HTN, and MDD.   Pt with worsening depression, stemming from his 59 year old child being placed in DSS custody 2 weeks ago. He hasn't been taking medications as he's medicaid has been discontinued.   Pt on a carb modified, heart healthy diet. Pt with no history of DM so will liberalize diet for wider variety of meal selections.   Reviewed wt hx; pt has experienced a 5.5% wt loss over the past 11 months, which is not significant for time frame.   Medications reviewed.   Labs reviewed: CBGS: 109 (inpatient orders for glycemic control are none).  Tox screen negative.   58 y.o. male  Height: Ht Readings from Last 1 Encounters:  05/11/24 5' 11 (1.803 m)    Weight: Wt Readings from Last 1 Encounters:  05/11/24 77.1 kg    Weight Hx: Wt Readings from Last 10 Encounters:  05/11/24 77.1 kg  01/28/24 77.1 kg  09/11/23 78.5 kg  09/10/23 83 kg  06/12/23 81.6 kg  09/03/22 87.5 kg  07/26/22 88.5 kg  09/15/21 86.2 kg  04/18/21 84.4 kg  12/18/18 84.4 kg    BMI:  Body mass index is 23.71 kg/m. BMI WDL.   Estimated Nutritional Needs: Kcal: 25-30 kcal/kg Protein: > 1 gram protein/kg Fluid: 1 ml/kcal  Diet Order:  Diet Order             Diet heart healthy/carb modified Room service appropriate? Yes; Fluid consistency: Thin  Diet effective now                  Pt is also offered choice of unit snacks mid-morning and mid-afternoon.  Pt is eating as desired.   Lab results  and medications reviewed.   Margery ORN, RD, LDN, CDCES Registered Dietitian III Certified Diabetes Care and Education Specialist If unable to reach this RD, please use RD Inpatient group chat on secure chat between hours of 8am-4 pm daily

## 2024-05-11 NOTE — B[facility] Counselor (Signed)
 CSW spoke with the patient's partner, Torrin Crihfield,  [phone].    She reports that patient was admitted for high blood pressure and dizziness.   She denies access to weapons.  She denies that patient is not a threat to self or others.   Sherryle Margo, MSW, LCSW 05/11/2024 3:20 PM

## 2024-05-11 NOTE — Progress Notes (Deleted)
 Discharge Note:  Patient denies SI/HI/AVH at this time. Discharge instructions, AVS, prescriptions, and transition record reviewed with patient. Patient agrees to comply with medication management, follow-up visit, and outpatient therapy. Patient belongings returned to patient. Patient questions and concerns addressed and answered. Patient ambulatory off unit.@ 1215 Patient discharged to home with self care.SABRA

## 2024-05-11 NOTE — B[facility] Suicide Risk Assessment (Signed)
 [facility] INPATIENT:  Family/Significant Other Suicide Prevention Education  Suicide Prevention Education:  Education Completed; Damon [name], partner, [phone],  (name of family member/significant other) has been identified by the patient as the family member/significant other with whom the patient will be residing, and identified as the person(s) who will aid the patient in the event of a mental health crisis (suicidal ideations/suicide attempt).  With written consent from the patient, the family member/significant other has been provided the following suicide prevention education, prior to the and/or following the discharge of the patient.  The suicide prevention education provided includes the following: Suicide risk factors Suicide prevention and interventions National Suicide Hotline telephone number [facility] assessment telephone number [facility] Emergency Assistance 911 [facility] and/or Residential Mobile Crisis Unit telephone number  Request made of family/significant other to: Remove weapons (e.g., guns, rifles, knives), all items previously/currently identified as safety concern.   Remove drugs/medications (over-the-counter, prescriptions, illicit drugs), all items previously/currently identified as a safety concern.  The family member/significant other verbalizes understanding of the suicide prevention education information provided.  The family member/significant other agrees to remove the items of safety concern listed above.  Damon [name] 05/11/2024, 3:16 PM

## 2024-05-11 NOTE — BH IP Treatment Plan (Signed)
 Interdisciplinary Treatment and Diagnostic Plan Update  05/11/2024 Time of Session: 10:21 AM Damon [name] MRN: [id number]  Principal Diagnosis: Paranoid schizophrenia (HCC)  Secondary Diagnoses: Principal Problem:   Paranoid schizophrenia (HCC)   Current Medications:  Current Facility-Administered Medications  Medication Dose Route Frequency Provider Last Rate Last Admin   acetaminophen  (TYLENOL ) tablet 650 mg  650 mg Oral Q6H PRN Olasunkanmi, Oluwatosin, NP       alum & mag hydroxide-simeth (MAALOX/MYLANTA) 200-200-20 MG/5ML suspension 30 mL  30 mL Oral Q4H PRN Olasunkanmi, Oluwatosin, NP       amLODipine  (NORVASC ) tablet 10 mg  10 mg Oral Daily Olasunkanmi, Oluwatosin, NP   10 mg at 05/11/24 9150   benazepril  (LOTENSIN ) tablet 10 mg  10 mg Oral Daily Olasunkanmi, Oluwatosin, NP   10 mg at 05/11/24 0849   benztropine  (COGENTIN ) tablet 1 mg  1 mg Oral BID Olasunkanmi, Oluwatosin, NP   1 mg at 05/11/24 0849   haloperidol  (HALDOL ) tablet 5 mg  5 mg Oral TID PRN Olasunkanmi, Oluwatosin, NP       And   diphenhydrAMINE  (BENADRYL ) capsule 50 mg  50 mg Oral TID PRN Olasunkanmi, Oluwatosin, NP       haloperidol  lactate (HALDOL ) injection 5 mg  5 mg Intramuscular TID PRN Olasunkanmi, Oluwatosin, NP       And   diphenhydrAMINE  (BENADRYL ) injection 50 mg  50 mg Intramuscular TID PRN Olasunkanmi, Oluwatosin, NP       And   LORazepam  (ATIVAN ) injection 2 mg  2 mg Intramuscular TID PRN Olasunkanmi, Oluwatosin, NP       haloperidol  lactate (HALDOL ) injection 10 mg  10 mg Intramuscular TID PRN Olasunkanmi, Oluwatosin, NP       And   diphenhydrAMINE  (BENADRYL ) injection 50 mg  50 mg Intramuscular TID PRN Olasunkanmi, Oluwatosin, NP       And   LORazepam  (ATIVAN ) injection 2 mg  2 mg Intramuscular TID PRN Olasunkanmi, Oluwatosin, NP       feeding supplement (ENSURE PLUS HIGH PROTEIN) liquid 237 mL  237 mL Oral BID BM Jadapalle, Sree, MD       hydrOXYzine  (ATARAX ) tablet 50 mg  50 mg Oral TID PRN  Olasunkanmi, Oluwatosin, NP       magnesium  hydroxide (MILK OF MAGNESIA) suspension 30 mL  30 mL Oral Daily PRN Olasunkanmi, Oluwatosin, NP       [START ON 05/12/2024] multivitamin with minerals tablet 1 tablet  1 tablet Oral Daily Meegan, Eryn, RPH       risperiDONE  (RISPERDAL ) tablet 1.5 mg  1.5 mg Oral BID Olasunkanmi, Oluwatosin, NP   1.5 mg at 05/11/24 0849   traZODone  (DESYREL ) tablet 50 mg  50 mg Oral QHS Olasunkanmi, Oluwatosin, NP       PTA Medications: Medications Prior to Admission  Medication Sig Dispense Refill Last Dose/Taking   amLODipine  (NORVASC ) 10 MG tablet Take 1 tablet (10 mg total) by mouth daily. 30 tablet 0    benazepril  (LOTENSIN ) 10 MG tablet Take 1 tablet (10 mg total) by mouth daily. 30 tablet 0    diphenhydrAMINE  (BENADRYL ) 25 mg capsule Take 25 mg by mouth every 6 (six) hours as needed for allergies.      risperiDONE  (RISPERDAL ) 3 MG tablet Take 1 tablet (3 mg total) by mouth 2 (two) times daily. 60 tablet 0    traZODone  (DESYREL ) 50 MG tablet Take 1 tablet (50mg ) by mouth daily AT BEDTIME for Insomnia 30 tablet 0     Patient Stressors:  Financial difficulties   Marital or family conflict   Medication change or noncompliance    Patient Strengths: Ability for insight  Motivation for treatment/growth   Treatment Modalities: Medication Management, Group therapy, Case management,  1 to 1 session with clinician, Psychoeducation, Recreational therapy.   Physician Treatment Plan for Primary Diagnosis: Paranoid schizophrenia (HCC) Long Term Goal(s):     Short Term Goals:    Medication Management: Evaluate patient's response, side effects, and tolerance of medication regimen.  Therapeutic Interventions: 1 to 1 sessions, Unit Group sessions and Medication administration.  Evaluation of Outcomes: Not Met  Physician Treatment Plan for Secondary Diagnosis: Principal Problem:   Paranoid schizophrenia (HCC)  Long Term Goal(s):     Short Term Goals:        Medication Management: Evaluate patient's response, side effects, and tolerance of medication regimen.  Therapeutic Interventions: 1 to 1 sessions, Unit Group sessions and Medication administration.  Evaluation of Outcomes: Not Met   RN Treatment Plan for Primary Diagnosis: Paranoid schizophrenia (HCC) Long Term Goal(s): Knowledge of disease and therapeutic regimen to maintain health will improve  Short Term Goals: Ability to verbalize frustration and anger appropriately will improve, Ability to demonstrate self-control, Ability to participate in decision making will improve, Ability to verbalize feelings will improve, Ability to disclose and discuss suicidal ideas, and Ability to identify and develop effective coping behaviors will improve  Medication Management: RN will administer medications as ordered by provider, will assess and evaluate patient's response and provide education to patient for prescribed medication. RN will report any adverse and/or side effects to prescribing provider.  Therapeutic Interventions: 1 on 1 counseling sessions, Psychoeducation, Medication administration, Evaluate responses to treatment, Monitor vital signs and CBGs as ordered, Perform/monitor CIWA, COWS, AIMS and Fall Risk screenings as ordered, Perform wound care treatments as ordered.  Evaluation of Outcomes: Not Met   LCSW Treatment Plan for Primary Diagnosis: Paranoid schizophrenia (HCC) Long Term Goal(s): Safe transition to appropriate next level of care at discharge, Engage patient in therapeutic group addressing interpersonal concerns.  Short Term Goals: Engage patient in aftercare planning with referrals and resources, Increase social support, Increase ability to appropriately verbalize feelings, Increase emotional regulation, Facilitate acceptance of mental health diagnosis and concerns, Facilitate patient progression through stages of change regarding substance use diagnoses and concerns,  Identify triggers associated with mental health/substance abuse issues, and Increase skills for wellness and recovery  Therapeutic Interventions: Assess for all discharge needs, 1 to 1 time with Social worker, Explore available resources and support systems, Assess for adequacy in community support network, Educate family and significant other(s) on suicide prevention, Complete Psychosocial Assessment, Interpersonal group therapy.  Evaluation of Outcomes: Not Met   Progress in Treatment: Attending groups: No. Participating in groups: No. Taking medication as prescribed: Yes. Toleration medication: Yes. Family/Significant other contact made: No, will contact:  CSW to contact once permission is granted.  Patient understands diagnosis: Yes. Discussing patient identified problems/goals with staff: Yes. Medical problems stabilized or resolved: Yes. Denies suicidal/homicidal ideation: Yes. Issues/concerns per patient self-inventory: No. Other: None  New problem(s) identified: No, Describe:  None  New Short Term/Long Term Goal(s):detox, elimination of symptoms of psychosis, medication management for mood stabilization; elimination of SI thoughts; development of comprehensive mental wellness/sobriety plan.    Patient Goals:  Get these voices under control.  Discharge Plan or Barriers: CSW to assist with the development of appropriate discharge plan.    Reason for Continuation of Hospitalization: Anxiety Depression Hallucinations Suicidal ideation  Estimated Length  of Stay: 1-7 days.   Last 3 [place] Suicide Severity Risk Score: Flowsheet Row Admission (Current) from 05/11/2024 in [facility] ED from 05/10/2024 in [facility] ED from 01/29/2024 in [facility]  C-SSRS RISK CATEGORY No Risk Moderate Risk No Risk    Last [facility] 2/9 Scores:    01/31/2024   10:53 AM 01/29/2024    6:50 AM 01/13/2023    9:19 AM   Depression screen PHQ 2/9  Decreased Interest 0 1 2  Down, Depressed, Hopeless 0 1 3  PHQ - 2 Score 0 2 5  Altered sleeping 0 0 3  Tired, decreased energy 1 0 3  Change in appetite 0 0 3  Feeling bad or failure about yourself  0 0 2  Trouble concentrating 1 1 2   Moving slowly or fidgety/restless 0 0 3  Suicidal thoughts 0 0 0  PHQ-9 Score 2 3 21   Difficult doing work/chores Not difficult at all Somewhat difficult Extremely dIfficult    Scribe for Treatment Team: Alveta CHRISTELLA Kerns, LCSW 05/11/2024 11:25 AM

## 2024-05-11 NOTE — Plan of Care (Signed)
 Pt new to the unit tonight, hasn't had time to progress  Problem: Education: Goal: Knowledge of [place] [facility] Education information/materials will improve Outcome: Not Progressing Goal: Emotional status will improve Outcome: Not Progressing Goal: Mental status will improve Outcome: Not Progressing Goal: Verbalization of understanding the information provided will improve Outcome: Not Progressing   Problem: Activity: Goal: Interest or engagement in activities will improve Outcome: Not Progressing Goal: Sleeping patterns will improve Outcome: Not Progressing   Problem: Coping: Goal: Ability to verbalize frustrations and anger appropriately will improve Outcome: Not Progressing Goal: Ability to demonstrate self-control will improve Outcome: Not Progressing   Problem: Health Behavior/Discharge Planning: Goal: Identification of resources available to assist in meeting health care needs will improve Outcome: Not Progressing Goal: Compliance with treatment plan for underlying cause of condition will improve Outcome: Not Progressing   Problem: Physical Regulation: Goal: Ability to maintain clinical measurements within normal limits will improve Outcome: Not Progressing   Problem: Safety: Goal: Periods of time without injury will increase Outcome: Not Progressing

## 2024-05-11 NOTE — Progress Notes (Addendum)
 Patient continues to deny suicidal or homicidal ideations plan or intent. He does endorse A/H for the pass 2 months due to been able to buy his medications. Pt stated the voices are lesser and they are friendly. Pt OOR and in the milieu, noted interacting with others and he is compliant with taking his medications.       05/11/24 1500  Psych Admission Type (Psych Patients Only)  Admission Status Voluntary  Psychosocial Assessment  Patient Complaints Depression  Eye Contact Brief  Facial Expression Flat  Affect Depressed  Speech Logical/coherent  Interaction Assertive  Motor Activity Slow  Appearance/Hygiene Unremarkable  Behavior Characteristics Cooperative  Mood Sad  Thought Process  Coherency WDL  Content WDL  Delusions None reported or observed  Perception WDL  Hallucination Auditory  Judgment Impaired  Confusion WDL  Danger to Self  Description of Suicide Plan denies  Agreement Not to Harm Self Yes  Description of Agreement verbal  Danger to Others  Danger to Others None reported or observed

## 2024-05-11 NOTE — Group Note (Signed)
 Date:  05/11/2024 Time:  11:29 AM  Group Topic/Focus:  Healthy Communication:   The focus of this group is to discuss communication, barriers to communication, as well as healthy ways to communicate with others.    Participation Level:  Active  Participation Quality:  Appropriate  Affect:  Appropriate  Cognitive:  Appropriate  Insight: Appropriate  Engagement in Group:  Engaged  Modes of Intervention:  Activity and Socialization  Additional Comments:    Deitra Caron Mainland 05/11/2024, 11:29 AM
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
## 2024-05-11 NOTE — Group Note (Signed)
 Date:  05/11/2024 Time:  3:55 PM  Group Topic/Focus:  Coping With Mental Health Crisis:   The purpose of this group is to help patients identify strategies for coping with mental health crisis.  Group discusses possible causes of crisis and ways to manage them effectively.    Participation Level:  Did Not Attend  Damon [name] 05/11/2024, 3:55 PM

## 2024-05-11 NOTE — Tx Team (Signed)
 Initial Treatment Plan 05/11/2024 12:49 AM Damon [name] FMW:[id number]    PATIENT STRESSORS: Financial difficulties   Marital or family conflict   Medication change or noncompliance     PATIENT STRENGTHS: Ability for insight  Motivation for treatment/growth    PATIENT IDENTIFIED PROBLEMS: Depression  Anxiety                   DISCHARGE CRITERIA:  Improved stabilization in mood, thinking, and/or behavior Verbal commitment to aftercare and medication compliance  PRELIMINARY DISCHARGE PLAN: Outpatient therapy Return to previous living arrangement  PATIENT/FAMILY INVOLVEMENT: This treatment plan has been presented to and reviewed with the patient, Damon [name].  The patient has been given the opportunity to ask questions and make suggestions.  [name], RN 05/11/2024, 12:49 AM

## 2024-05-11 NOTE — Progress Notes (Signed)
 Pt admitted from [facility], report received from Paige, [place]. Pt calm and pleasant during assessment denying SI/HI/AVH. Pt stated his Medicaid got cut so he hasn't been taking his medications and says he is here to get back on them. Pt endorses A/H but stated the voices are scrambled and he can't make out what they are saying. Pt skin assessment completed with Raycha, RN. Pt has a scar on his chest from when he had heart surgery when he was a child. No other abnormalities found. No contraband found. Pt given education, support, and encouragement to be active in his treatment plan. Pt being monitored Q 15 minutes for safety per unit protocol, remains safe on the unit

## 2024-05-12 NOTE — Group Note (Signed)
 Date:  05/12/2024 Time:  8:40 PM  Group Topic/Focus:  Wrap-Up Group:   The focus of this group is to help patients review their daily goal of treatment and discuss progress on daily workbooks.    Participation Level:  Active  Participation Quality:  Appropriate and Attentive  Affect:  Appropriate  Cognitive:  Alert  Insight: Good  Engagement in Group:  Engaged  Modes of Intervention:  Discussion  Additional Comments:     Maglione,Semaj Kham E 05/12/2024, 8:40 PM

## 2024-05-12 NOTE — Group Note (Signed)
 Date:  05/12/2024 Time:  2:51 PM  Group Topic/Focus:  Self Care:   The focus of this group is to help patients understand the importance of self-care in order to improve or restore emotional, physical, spiritual, interpersonal, and financial health. Wellness Toolbox:   The focus of this group is to discuss various aspects of wellness, balancing those aspects and exploring ways to increase the ability to experience wellness.  Patients will create a wellness toolbox for use upon discharge.    Participation Level:  Did Not Attend  Participation Quality:    Affect:    Cognitive:    Insight:   Engagement in Group:    Modes of Intervention:    Additional Comments:    Genessis Flanary L Kalea Perine 05/12/2024, 2:51 PM

## 2024-05-12 NOTE — Progress Notes (Signed)
   05/12/24 0900  Psych Admission Type (Psych Patients Only)  Admission Status Voluntary  Psychosocial Assessment  Patient Complaints Depression  Eye Contact Brief  Facial Expression Flat  Affect Depressed  Speech Logical/coherent  Interaction Guarded;Minimal  Motor Activity Slow  Appearance/Hygiene Unremarkable  Behavior Characteristics Cooperative  Mood Depressed;Sad  Thought Process  Coherency WDL  Content WDL  Delusions None reported or observed  Perception WDL  Hallucination None reported or observed  Judgment Poor  Confusion None  Danger to Self  Current suicidal ideation? Passive  Self-Injurious Behavior No self-injurious ideation or behavior indicators observed or expressed   Agreement Not to Harm Self Yes  Description of Agreement Verbal  Danger to Others  Danger to Others None reported or observed

## 2024-05-12 NOTE — Plan of Care (Signed)

## 2024-05-12 NOTE — Plan of Care (Signed)
   Problem: Education: Goal: Knowledge of [place] [facility] Education information/materials will improve Outcome: Progressing Goal: Emotional status will improve Outcome: Progressing

## 2024-05-12 NOTE — Progress Notes (Signed)
 [facility] MD Progress Note  05/12/2024 1:09 PM Damon [name]  MRN:  [id number]   History of Present Illness:  The patient is a 58 year old male with a psychiatric history of paranoid schizophrenia and major depressive disorder, severe with psychotic features, and a medical history of essential hypertension, who presented voluntarily to [facility] reporting auditory hallucinations, sadness, and paranoia. He expressed concern related to loss of custody of his 15 year old son, which he identifies as a significant emotional stressor.  Per Chart Review Patient presented to the emergency department with complaints of hearing voices, seeing things, racing thoughts, paranoia, poor sleep, poor appetite, and approximately 10-pound weight loss. Documentation supports ongoing symptoms of psychosis and mood disturbance, in the context of medication nonadherence due to recent loss of Medicaid.He describes his recent feelings as situational and no longer present.  Subjective:  Chart reviewed, case discussed in multidisciplinary meeting, patient seen during rounds.   Patient seen today for follow up psychiatric interview. Upon approach he is noted to be sitting in day room participating in group, agreeable for assessment. He states I finally got some sleep and smiles. He reports tolerating restart of medications without any side effect but continues to report AH. He reports mood is calmer and less anxious today. We review symptoms of depression and he denies struggles with mood reporting lots of situational and family stressors but feels he maintains mood well. He voices realizes hallucinations worsen with stressors and that medication noncompliance is primary contributory factor. He denies SI/HI. Discussed plan to continue Risperdal  at same dosage today and titrate as needed if AH do not resolve, he voices understanding and agrees to plan of care.     Sleep: Good  Appetite:  Good  Past  Psychiatric History: see h&P Family History:  Family History  Problem Relation Age of Onset   Colon cancer Mother    Hyperlipidemia Father    Heart failure Brother    Diabetes Other    Stroke Other    Social History:  Social History   Substance and Sexual Activity  Alcohol Use Yes   Alcohol/week: 1.0 standard drink of alcohol   Types: 1 Cans of beer per week   Comment: one can of beer weekly     Social History   Substance and Sexual Activity  Drug Use Not Currently   Types: Marijuana    Social History   Socioeconomic History   Marital status: Single    Spouse name: Not on file   Number of children: Not on file   Years of education: Not on file   Highest education level: Not on file  Occupational History   Not on file  Tobacco Use   Smoking status: Every Day    Types: Cigarettes   Smokeless tobacco: Never  Substance and Sexual Activity   Alcohol use: Yes    Alcohol/week: 1.0 standard drink of alcohol    Types: 1 Cans of beer per week    Comment: one can of beer weekly   Drug use: Not Currently    Types: Marijuana   Sexual activity: Yes    Birth control/protection: Condom  Other Topics Concern   Not on file  Social History Narrative   ** Merged History Encounter **       Social Drivers of Health   Financial Resource Strain: Low Risk  (01/13/2023)   Overall Financial Resource Strain (CARDIA)    Difficulty of Paying Living Expenses: Not hard at all  Food Insecurity: No  Food Insecurity (05/11/2024)   Hunger Vital Sign    Worried About Running Out of Food in the Last Year: Never true    Ran Out of Food in the Last Year: Never true  Transportation Needs: No Transportation Needs (05/11/2024)   PRAPARE - Administrator, Civil Service (Medical): No    Lack of Transportation (Non-Medical): No  Physical Activity: Sufficiently Active (01/13/2023)   Exercise Vital Sign    Days of Exercise per Week: 5 days    Minutes of Exercise per Session: 60 min   Stress: Stress Concern Present (01/13/2023)   [employer] of Occupational Health - Occupational Stress Questionnaire    Feeling of Stress : To some extent  Social Connections: Socially Isolated (05/11/2024)   Social Connection and Isolation Panel    Frequency of Communication with Friends and Family: Twice a week    Frequency of Social Gatherings with Friends and Family: Once a week    Attends Religious Services: Never    Database administrator or Organizations: No    Attends Engineer, structural: Never    Marital Status: Never married   Past Medical History:  Past Medical History:  Diagnosis Date   Depression    Hypertension    Paranoid schizophrenia (HCC)    Schizophrenia (HCC)    History reviewed. No pertinent surgical history.  Current Medications: Current Facility-Administered Medications  Medication Dose Route Frequency Provider Last Rate Last Admin   acetaminophen  (TYLENOL ) tablet 650 mg  650 mg Oral Q6H PRN Olasunkanmi, Oluwatosin, NP   650 mg at 05/12/24 0814   alum & mag hydroxide-simeth (MAALOX/MYLANTA) 200-200-20 MG/5ML suspension 30 mL  30 mL Oral Q4H PRN Olasunkanmi, Oluwatosin, NP       amLODipine  (NORVASC ) tablet 10 mg  10 mg Oral Daily Olasunkanmi, Oluwatosin, NP   10 mg at 05/12/24 9187   benazepril  (LOTENSIN ) tablet 10 mg  10 mg Oral Daily Olasunkanmi, Oluwatosin, NP   10 mg at 05/12/24 9187   benztropine  (COGENTIN ) tablet 1 mg  1 mg Oral BID Olasunkanmi, Oluwatosin, NP   1 mg at 05/12/24 9187   haloperidol  (HALDOL ) tablet 5 mg  5 mg Oral TID PRN Olasunkanmi, Oluwatosin, NP       And   diphenhydrAMINE  (BENADRYL ) capsule 50 mg  50 mg Oral TID PRN Olasunkanmi, Oluwatosin, NP       haloperidol  lactate (HALDOL ) injection 5 mg  5 mg Intramuscular TID PRN Olasunkanmi, Oluwatosin, NP       And   diphenhydrAMINE  (BENADRYL ) injection 50 mg  50 mg Intramuscular TID PRN Olasunkanmi, Oluwatosin, NP       And   LORazepam  (ATIVAN ) injection 2 mg  2 mg  Intramuscular TID PRN Olasunkanmi, Oluwatosin, NP       haloperidol  lactate (HALDOL ) injection 10 mg  10 mg Intramuscular TID PRN Olasunkanmi, Oluwatosin, NP       And   diphenhydrAMINE  (BENADRYL ) injection 50 mg  50 mg Intramuscular TID PRN Olasunkanmi, Oluwatosin, NP       And   LORazepam  (ATIVAN ) injection 2 mg  2 mg Intramuscular TID PRN Olasunkanmi, Oluwatosin, NP       feeding supplement (ENSURE PLUS HIGH PROTEIN) liquid 237 mL  237 mL Oral BID BM Jadapalle, Sree, MD   237 mL at 05/12/24 1047   hydrOXYzine  (ATARAX ) tablet 50 mg  50 mg Oral TID PRN Olasunkanmi, Oluwatosin, NP       magnesium  hydroxide (MILK OF MAGNESIA) suspension 30 mL  30 mL Oral Daily PRN Olasunkanmi, Oluwatosin, NP       multivitamin with minerals tablet 1 tablet  1 tablet Oral Daily Meegan, Eryn, RPH   1 tablet at 05/12/24 9187   nicotine  (NICODERM CQ  - dosed in mg/24 hours) patch 21 mg  21 mg Transdermal Daily Cleotilde Hoy HERO, NP   21 mg at 05/12/24 9187   nicotine  polacrilex (NICORETTE) gum 2 mg  2 mg Oral PRN Cleotilde Hoy HERO, NP       risperiDONE  (RISPERDAL ) tablet 1.5 mg  1.5 mg Oral BID Olasunkanmi, Oluwatosin, NP   1.5 mg at 05/12/24 9187   traZODone  (DESYREL ) tablet 50 mg  50 mg Oral QHS Olasunkanmi, Oluwatosin, NP   50 mg at 05/11/24 2133    Lab Results:  Results for orders placed or performed during the hospital encounter of 05/10/24 (from the past 48 hours)  POCT Urine Drug Screen - (I-Screen)     Status: Normal   Collection Time: 05/10/24  2:36 PM  Result Value Ref Range   POC Amphetamine UR None Detected NONE DETECTED (Cut Off Level 1000 ng/mL)   POC Secobarbital (BAR) None Detected NONE DETECTED (Cut Off Level 300 ng/mL)   POC Buprenorphine (BUP) None Detected NONE DETECTED (Cut Off Level 10 ng/mL)   POC Oxazepam (BZO) None Detected NONE DETECTED (Cut Off Level 300 ng/mL)   POC Cocaine UR None Detected NONE DETECTED (Cut Off Level 300 ng/mL)   POC Methamphetamine UR None Detected NONE DETECTED  (Cut Off Level 1000 ng/mL)   POC Morphine  None Detected NONE DETECTED (Cut Off Level 300 ng/mL)   POC Methadone UR None Detected NONE DETECTED (Cut Off Level 300 ng/mL)   POC Oxycodone  UR None Detected NONE DETECTED (Cut Off Level 100 ng/mL)   POC Marijuana UR None Detected NONE DETECTED (Cut Off Level 50 ng/mL)    Blood Alcohol level:  Lab Results  Component Value Date   ETH <15 05/10/2024   ETH 49 (H) 01/28/2024    Metabolic Disorder Labs: Lab Results  Component Value Date   HGBA1C 5.5 05/10/2024   MPG 111.15 05/10/2024   MPG 116.89 09/11/2023   Lab Results  Component Value Date   PROLACTIN 4.6 05/10/2024   PROLACTIN 55.0 (H) 10/13/2017   Lab Results  Component Value Date   CHOL 221 (H) 05/10/2024   TRIG 86 05/10/2024   HDL 97 05/10/2024   CHOLHDL 2.3 05/10/2024   VLDL 17 05/10/2024   LDLCALC 107 (H) 05/10/2024   LDLCALC 59 09/11/2023      Psychiatric Specialty Exam:  Presentation  General Appearance:  Appropriate for Environment; Fairly Groomed  Eye Contact: Good  Speech: Normal Rate  Speech Volume: Normal    Mood and Affect  Mood: Euthymic  Affect: Congruent; Appropriate   Thought Process  Thought Processes: Linear  Descriptions of Associations:Intact  Orientation:Full (Time, Place and Person)  Thought Content:Logical  Hallucinations:Hallucinations: Auditory  Ideas of Reference:None  Suicidal Thoughts:Suicidal Thoughts: No  Homicidal Thoughts:Homicidal Thoughts: No   Sensorium  Memory: Recent Good; Remote Good  Judgment: Fair  Insight: Fair   Art therapist  Concentration: Good  Attention Span: Good  Recall: Good  Fund of Knowledge: Good  Language: Good   Psychomotor Activity  Psychomotor Activity: Psychomotor Activity: Normal  Musculoskeletal: Strength & Muscle Tone: within normal limits Gait & Station: normal Assets  Assets: Manufacturing systems engineer; Social Support    Physical  Exam: Physical Exam ROS Blood pressure (!) 148/108, pulse 81, temperature 97.9 F (  36.6 C), resp. rate 18, height 5' 11 (1.803 m), weight 77.1 kg, SpO2 100%. Body mass index is 23.71 kg/m.  Diagnosis: Principal Problem:   Paranoid schizophrenia (HCC)   PLAN: Safety and Monitoring:  -- Voluntary admission to inpatient psychiatric unit for safety, stabilization and treatment  -- Daily contact with patient to assess and evaluate symptoms and progress in treatment  -- Patient's case to be discussed in multi-disciplinary team meeting  -- Observation Level : q15 minute checks  -- Vital signs:  q12 hours  -- Precautions: suicide, elopement, and assault -- Encouraged patient to participate in unit milieu and in scheduled group therapies  2. Psychiatric Diagnoses and Treatment:   Paranoid Schizophrenia  Medications Risperdal  (Risperidone ) 1.5 mg PO BID - restarted  Plan It is the judgment of the undersigned that the patient remains appropriate for treatment in this acute psychiatric setting related to concern for safety in the context of medication nonadherence and psychosocial destabilization. Medication education provided, including risks, benefits, and side effects Patient verbalized understanding and agrees to plan of care Emphasis placed on connection with outpatient mental health services upon discharge      -- The risks/benefits/side-effects/alternatives to this medication were discussed in detail with the patient and time was given for questions. The patient consents to medication trial.                -- Metabolic profile and EKG monitoring obtained while on an atypical antipsychotic (BMI: Lipid Panel: HbgA1c: QTc:)              -- Encouraged patient to participate in unit milieu and in scheduled group therapies       3. Medical Issues Being Addressed: No acute needs identified     4. Discharge Planning:   -- Social work and case management to assist with discharge  planning and identification of hospital follow-up needs prior to discharge  -- Estimated LOS: 3-4 days  Hoy CHRISTELLA Pinal, NP 05/12/2024, 1:09 PM

## 2024-05-12 NOTE — Progress Notes (Signed)
   05/12/24 0015  Psych Admission Type (Psych Patients Only)  Admission Status Voluntary  Psychosocial Assessment  Patient Complaints Depression  Eye Contact Brief  Facial Expression Flat  Affect Depressed  Speech Logical/coherent  Interaction Guarded  Motor Activity Slow  Appearance/Hygiene Unremarkable  Behavior Characteristics Cooperative  Mood Sad  Aggressive Behavior  Effect No apparent injury  Thought Process  Coherency WDL  Content WDL  Delusions None reported or observed  Perception WDL  Hallucination Auditory  Judgment Impaired  Confusion WDL  Danger to Others  Danger to Others None reported or observed

## 2024-05-12 NOTE — Group Note (Signed)
 Date:  05/12/2024 Time:  11:05 AM  Group Topic/Focus:  Coping With Mental Health Crisis:   The purpose of this group is to help patients identify strategies for coping with mental health crisis.  Group discusses possible causes of crisis and ways to manage them effectively. Goals Group:   The focus of this group is to help patients establish daily goals to achieve during treatment and discuss how the patient can incorporate goal setting into their daily lives to aide in recovery. Overcoming Stress:   The focus of this group is to define stress and help patients assess their triggers.    Participation Level:  Active  Participation Quality:  Appropriate and Attentive  Affect:  Appropriate  Cognitive:  Appropriate  Insight: Appropriate and Good  Engagement in Group:  Engaged  Modes of Intervention:  Activity and Discussion  Additional Comments:    [name] 05/12/2024, 11:05 AM

## 2024-05-12 NOTE — Group Note (Signed)
 Date:  05/12/2024 Time:  7:07 AM  Group Topic/Focus:  Goals Group:   The focus of this group is to help patients establish daily goals to achieve during treatment and discuss how the patient can incorporate goal setting into their daily lives to aide in recovery. Self Care:   The focus of this group is to help patients understand the importance of self-care in order to improve or restore emotional, physical, spiritual, interpersonal, and financial health.    Participation Level:  Active  Participation Quality:  Appropriate  Affect:  Appropriate  Cognitive:  Alert, Appropriate, and Oriented  Insight: Appropriate and Good  Engagement in Group:  Engaged  Modes of Intervention:  Discussion and Support  Additional Comments:  N/A  Damon [name] 05/12/2024, 7:07 AM

## 2024-05-13 NOTE — Group Note (Signed)
 Date:  05/13/2024 Time:  8:57 PM  Group Topic/Focus:  Self Care:   The focus of this group is to help patients understand the importance of self-care in order to improve or restore emotional, physical, spiritual, interpersonal, and financial health. Stages of Change:   The focus of this group is to explain the stages of change and help patients identify changes they want to make upon discharge. Wrap-Up Group:   The focus of this group is to help patients review their daily goal of treatment and discuss progress on daily workbooks.    Participation Level:  Active  Participation Quality:  Appropriate and Attentive  Affect:  Appropriate and Excited  Cognitive:  Alert, Appropriate, and Oriented  Insight: Appropriate and Good  Engagement in Group:  Engaged and Supportive  Modes of Intervention:  Discussion and Support  Additional Comments:     Maglione,Ellora Varnum E 05/13/2024, 8:57 PM

## 2024-05-13 NOTE — Plan of Care (Signed)
  Problem: Education: Goal: Emotional status will improve Outcome: Progressing Goal: Mental status will improve Outcome: Progressing Goal: Verbalization of understanding the information provided will improve Outcome: Progressing   Problem: Health Behavior/Discharge Planning: Goal: Compliance with treatment plan for underlying cause of condition will improve Outcome: Progressing   Problem: Physical Regulation: Goal: Ability to maintain clinical measurements within normal limits will improve Outcome: Progressing   Problem: Safety: Goal: Periods of time without injury will increase Outcome: Progressing

## 2024-05-13 NOTE — Group Note (Signed)
 Date:  05/13/2024 Time:  11:13 AM  Group Topic/Focus:  Wellness Toolbox:   The focus of this group is to discuss various aspects of wellness, balancing those aspects and exploring ways to increase the ability to experience wellness.  Patients will create a wellness toolbox for use upon discharge.    Participation Level:  Active  Participation Quality:  Appropriate  Affect:  Appropriate  Cognitive:  Appropriate  Insight: Appropriate  Engagement in Group:  Engaged  Modes of Intervention:  Socialization  Additional Comments:    Deitra Clap Dominico Rod 05/13/2024, 11:13 AM

## 2024-05-13 NOTE — Progress Notes (Signed)
 [facility] MD Progress Note  05/13/2024 11:39 AM Damon [name]  MRN:  [id number]   History of Present Illness:  The patient is a 58 year old male with a psychiatric history of paranoid schizophrenia and major depressive disorder, severe with psychotic features, and a medical history of essential hypertension, who presented voluntarily to [facility] reporting auditory hallucinations, sadness, and paranoia. He expressed concern related to loss of custody of his 66 year old son, which he identifies as a significant emotional stressor.  Per Chart Review Patient presented to the emergency department with complaints of hearing voices, seeing things, racing thoughts, paranoia, poor sleep, poor appetite, and approximately 10-pound weight loss. Documentation supports ongoing symptoms of psychosis and mood disturbance, in the context of medication nonadherence due to recent loss of Medicaid.He describes his recent feelings as situational and no longer present.  Subjective:  Chart reviewed, case discussed in multidisciplinary meeting, patient seen during rounds.   Patient seen today for follow up. He is noted to appear bright and relaxed. He denies any further AH reporting dose of Risperdal  is effective. He denies depression and anxiety. He denies VH and there is no delusional or paranoid thought content. He engages well and has been participating on unit without incident.   05/12/24: Patient seen today for follow up psychiatric interview. Upon approach he is noted to be sitting in day room participating in group, agreeable for assessment. He states I finally got some sleep and smiles. He reports tolerating restart of medications without any side effect but continues to report AH. He reports mood is calmer and less anxious today. We review symptoms of depression and he denies struggles with mood reporting lots of situational and family stressors but feels he maintains mood well. He voices  realizes hallucinations worsen with stressors and that medication noncompliance is primary contributory factor. He denies SI/HI. Discussed plan to continue Risperdal  at same dosage today and titrate as needed if AH do not resolve, he voices understanding and agrees to plan of care.     Sleep: Good  Appetite:  Good  Past Psychiatric History: see h&P Family History:  Family History  Problem Relation Age of Onset   Colon cancer Mother    Hyperlipidemia Father    Heart failure Brother    Diabetes Other    Stroke Other    Social History:  Social History   Substance and Sexual Activity  Alcohol Use Yes   Alcohol/week: 1.0 standard drink of alcohol   Types: 1 Cans of beer per week   Comment: one can of beer weekly     Social History   Substance and Sexual Activity  Drug Use Not Currently   Types: Marijuana    Social History   Socioeconomic History   Marital status: Single    Spouse name: Not on file   Number of children: Not on file   Years of education: Not on file   Highest education level: Not on file  Occupational History   Not on file  Tobacco Use   Smoking status: Every Day    Types: Cigarettes   Smokeless tobacco: Never  Substance and Sexual Activity   Alcohol use: Yes    Alcohol/week: 1.0 standard drink of alcohol    Types: 1 Cans of beer per week    Comment: one can of beer weekly   Drug use: Not Currently    Types: Marijuana   Sexual activity: Yes    Birth control/protection: Condom  Other Topics Concern  Not on file  Social History Narrative   ** Merged History Encounter **       Social Drivers of Health   Financial Resource Strain: Low Risk  (01/13/2023)   Overall Financial Resource Strain (CARDIA)    Difficulty of Paying Living Expenses: Not hard at all  Food Insecurity: No Food Insecurity (05/11/2024)   Hunger Vital Sign    Worried About Running Out of Food in the Last Year: Never true    Ran Out of Food in the Last Year: Never true   Transportation Needs: No Transportation Needs (05/11/2024)   PRAPARE - Administrator, Civil Service (Medical): No    Lack of Transportation (Non-Medical): No  Physical Activity: Sufficiently Active (01/13/2023)   Exercise Vital Sign    Days of Exercise per Week: 5 days    Minutes of Exercise per Session: 60 min  Stress: Stress Concern Present (01/13/2023)   [employer] of Occupational Health - Occupational Stress Questionnaire    Feeling of Stress : To some extent  Social Connections: Socially Isolated (05/11/2024)   Social Connection and Isolation Panel    Frequency of Communication with Friends and Family: Twice a week    Frequency of Social Gatherings with Friends and Family: Once a week    Attends Religious Services: Never    Database administrator or Organizations: No    Attends Engineer, structural: Never    Marital Status: Never married   Past Medical History:  Past Medical History:  Diagnosis Date   Depression    Hypertension    Paranoid schizophrenia (HCC)    Schizophrenia (HCC)    History reviewed. No pertinent surgical history.  Current Medications: Current Facility-Administered Medications  Medication Dose Route Frequency Provider Last Rate Last Admin   acetaminophen  (TYLENOL ) tablet 650 mg  650 mg Oral Q6H PRN Olasunkanmi, Oluwatosin, NP   650 mg at 05/13/24 0818   alum & mag hydroxide-simeth (MAALOX/MYLANTA) 200-200-20 MG/5ML suspension 30 mL  30 mL Oral Q4H PRN Olasunkanmi, Oluwatosin, NP       amLODipine  (NORVASC ) tablet 10 mg  10 mg Oral Daily Olasunkanmi, Oluwatosin, NP   10 mg at 05/13/24 0810   benazepril  (LOTENSIN ) tablet 10 mg  10 mg Oral Daily Olasunkanmi, Oluwatosin, NP   10 mg at 05/13/24 0810   benztropine  (COGENTIN ) tablet 1 mg  1 mg Oral BID Olasunkanmi, Oluwatosin, NP   1 mg at 05/13/24 9189   haloperidol  (HALDOL ) tablet 5 mg  5 mg Oral TID PRN Olasunkanmi, Oluwatosin, NP       And   diphenhydrAMINE  (BENADRYL ) capsule 50 mg   50 mg Oral TID PRN Olasunkanmi, Oluwatosin, NP       haloperidol  lactate (HALDOL ) injection 5 mg  5 mg Intramuscular TID PRN Olasunkanmi, Oluwatosin, NP       And   diphenhydrAMINE  (BENADRYL ) injection 50 mg  50 mg Intramuscular TID PRN Olasunkanmi, Oluwatosin, NP       And   LORazepam  (ATIVAN ) injection 2 mg  2 mg Intramuscular TID PRN Olasunkanmi, Oluwatosin, NP       haloperidol  lactate (HALDOL ) injection 10 mg  10 mg Intramuscular TID PRN Olasunkanmi, Oluwatosin, NP       And   diphenhydrAMINE  (BENADRYL ) injection 50 mg  50 mg Intramuscular TID PRN Olasunkanmi, Oluwatosin, NP       And   LORazepam  (ATIVAN ) injection 2 mg  2 mg Intramuscular TID PRN Olasunkanmi, Oluwatosin, NP  feeding supplement (ENSURE PLUS HIGH PROTEIN) liquid 237 mL  237 mL Oral BID BM Jadapalle, Sree, MD   237 mL at 05/13/24 1022   hydrOXYzine  (ATARAX ) tablet 50 mg  50 mg Oral TID PRN Olasunkanmi, Oluwatosin, NP       magnesium  hydroxide (MILK OF MAGNESIA) suspension 30 mL  30 mL Oral Daily PRN Olasunkanmi, Oluwatosin, NP       multivitamin with minerals tablet 1 tablet  1 tablet Oral Daily Meegan, Eryn, RPH   1 tablet at 05/13/24 0810   nicotine  (NICODERM CQ  - dosed in mg/24 hours) patch 21 mg  21 mg Transdermal Daily Cleotilde Hoy HERO, NP   21 mg at 05/13/24 0813   nicotine  polacrilex (NICORETTE) gum 2 mg  2 mg Oral PRN Cleotilde Hoy HERO, NP   2 mg at 05/12/24 2007   risperiDONE  (RISPERDAL ) tablet 1.5 mg  1.5 mg Oral BID Olasunkanmi, Oluwatosin, NP   1.5 mg at 05/13/24 9187   traZODone  (DESYREL ) tablet 50 mg  50 mg Oral QHS Olasunkanmi, Oluwatosin, NP   50 mg at 05/12/24 2120    Lab Results:  No results found for this or any previous visit (from the past 48 hours).   Blood Alcohol level:  Lab Results  Component Value Date   ETH <15 05/10/2024   ETH 49 (H) 01/28/2024    Metabolic Disorder Labs: Lab Results  Component Value Date   HGBA1C 5.5 05/10/2024   MPG 111.15 05/10/2024   MPG 116.89  09/11/2023   Lab Results  Component Value Date   PROLACTIN 4.6 05/10/2024   PROLACTIN 55.0 (H) 10/13/2017   Lab Results  Component Value Date   CHOL 221 (H) 05/10/2024   TRIG 86 05/10/2024   HDL 97 05/10/2024   CHOLHDL 2.3 05/10/2024   VLDL 17 05/10/2024   LDLCALC 107 (H) 05/10/2024   LDLCALC 59 09/11/2023      Psychiatric Specialty Exam:  Presentation  General Appearance:  Appropriate for Environment  Eye Contact: Good  Speech: Normal Rate  Speech Volume: Normal    Mood and Affect  Mood: Euthymic  Affect: Appropriate   Thought Process  Thought Processes: Linear  Descriptions of Associations:Intact  Orientation:Full (Time, Place and Person)  Thought Content:Logical  Hallucinations:Hallucinations: None   Ideas of Reference:None  Suicidal Thoughts:Suicidal Thoughts: No   Homicidal Thoughts:Homicidal Thoughts: No    Sensorium  Memory: Recent Good; Remote Good  Judgment: Fair  Insight: Good   Executive Functions  Concentration: Good  Attention Span: Good  Recall: Good  Fund of Knowledge: Good  Language: Good   Psychomotor Activity  Psychomotor Activity: Psychomotor Activity: Normal   Musculoskeletal: Strength & Muscle Tone: within normal limits Gait & Station: normal Assets  Assets: Desire for Improvement; Communication Skills; Housing; Social Support    Physical Exam: Physical Exam ROS Blood pressure (!) 145/91, pulse 87, temperature (!) 97.5 F (36.4 C), resp. rate 16, height 5' 11 (1.803 m), weight 77.1 kg, SpO2 100%. Body mass index is 23.71 kg/m.  Diagnosis: Principal Problem:   Paranoid schizophrenia (HCC)   PLAN: Safety and Monitoring:  -- Voluntary admission to inpatient psychiatric unit for safety, stabilization and treatment  -- Daily contact with patient to assess and evaluate symptoms and progress in treatment  -- Patient's case to be discussed in multi-disciplinary team meeting  --  Observation Level : q15 minute checks  -- Vital signs:  q12 hours  -- Precautions: suicide, elopement, and assault -- Encouraged patient to participate in unit  milieu and in scheduled group therapies  2. Psychiatric Diagnoses and Treatment:   Paranoid Schizophrenia  Medications Risperdal  (Risperidone ) 1.5 mg PO BID - restarted  Plan It is the judgment of the undersigned that the patient remains appropriate for treatment in this acute psychiatric setting related to concern for safety in the context of medication nonadherence and psychosocial destabilization. Medication education provided, including risks, benefits, and side effects Patient verbalized understanding and agrees to plan of care Emphasis placed on connection with outpatient mental health services upon discharge      -- The risks/benefits/side-effects/alternatives to this medication were discussed in detail with the patient and time was given for questions. The patient consents to medication trial.                -- Metabolic profile and EKG monitoring obtained while on an atypical antipsychotic (BMI: Lipid Panel: HbgA1c: QTc:)              -- Encouraged patient to participate in unit milieu and in scheduled group therapies       3. Medical Issues Being Addressed: No acute needs identified     4. Discharge Planning:   -- Social work and case management to assist with discharge planning and identification of hospital follow-up needs prior to discharge  -- Estimated LOS: 3-4 days  Hoy CHRISTELLA Pinal, NP 05/13/2024, 11:39 AM

## 2024-05-13 NOTE — Progress Notes (Signed)
 Pt visible in milieu; informed staff he lost is insurance coverage and was not taking his medication before coming into the hospital.  He endorsed anxiety and depression both rated moderate; reported auditory hallucinations noted that the voices weren't clear but sometime the voices tell him to hurt himself and that it last between 0 to 1 hours or more at times. Pt denied SI/HI.  Trazodone  given.    05/12/24 2300  Psych Admission Type (Psych Patients Only)  Admission Status Voluntary  Psychosocial Assessment  Patient Complaints Anxiety;Depression  Eye Contact Fair  Facial Expression Flat;Anxious  Affect Anxious;Depressed  Speech Soft;Logical/coherent  Interaction Minimal  Motor Activity Slow  Appearance/Hygiene Unremarkable  Behavior Characteristics Cooperative;Appropriate to situation  Mood Anxious;Depressed  Thought Process  Coherency WDL  Content WDL  Delusions None reported or observed  Perception WDL  Hallucination None reported or observed  Judgment Limited  Confusion None  Danger to Self  Current suicidal ideation? Denies  Agreement Not to Harm Self Yes  Description of Agreement Verbal    Problem: Education: Goal: Knowledge of [facility] Education information/materials will improve Outcome: Progressing Goal: Emotional status will improve Outcome: Progressing Goal: Mental status will improve Outcome: Progressing Goal: Verbalization of understanding the information provided will improve Outcome: Progressing   Problem: Activity: Goal: Interest or engagement in activities will improve Outcome: Progressing Goal: Sleeping patterns will improve Outcome: Progressing   Problem: Coping: Goal: Ability to verbalize frustrations and anger appropriately will improve Outcome: Progressing Goal: Ability to demonstrate self-control will improve Outcome: Progressing

## 2024-05-13 NOTE — Group Note (Signed)
 LCSW Group Therapy Note   Group Date: 05/13/2024 Start Time: 1300 End Time: 1400   Type of Therapy and Topic:  Group Therapy: Boundaries  Participation Level:  Active  Description of Group: This group will address the use of boundaries in their personal lives. Patients will explore why boundaries are important, the difference between healthy and unhealthy boundaries, and negative and postive outcomes of different boundaries and will look at how boundaries can be crossed.  Patients will be encouraged to identify current boundaries in their own lives and identify what kind of boundary is being set. Facilitators will guide patients in utilizing problem-solving interventions to address and correct types boundaries being used and to address when no boundary is being used. Understanding and applying boundaries will be explored and addressed for obtaining and maintaining a balanced life. Patients will be encouraged to explore ways to assertively make their boundaries and needs known to significant others in their lives, using other group members and facilitator for role play, support, and feedback.  Therapeutic Goals:  1.  Patient will identify areas in their life where setting clear boundaries could be  used to improve their life.  2.  Patient will identify signs/triggers that a boundary is not being respected. 3.  Patient will identify two ways to set boundaries in order to achieve balance in  their lives: 4.  Patient will demonstrate ability to communicate their needs and set boundaries  through discussion and/or role plays  Summary of Patient Progress:  Patient was present and active throughout the session and proved open to feedback from CSW and peers. Patient demonstrated proficient insight into the subject matter, was respectful of peers, and was present throughout the entire session.  Therapeutic Modalities:   Cognitive Behavioral Therapy Solution-Focused Therapy  Alveta CHRISTELLA Ezzard ISRAEL 05/13/2024  2:11 PM

## 2024-05-13 NOTE — Progress Notes (Signed)
   05/13/24 0900  Psych Admission Type (Psych Patients Only)  Admission Status Voluntary  Psychosocial Assessment  Patient Complaints Anxiety;Depression  Eye Contact Fair  Facial Expression Animated  Affect Blunted  Speech Logical/coherent;Soft  Interaction Minimal;Guarded  Motor Activity Slow  Appearance/Hygiene Unremarkable  Behavior Characteristics Cooperative  Mood Pleasant (Patient writes his goal for today is meds and that he will take my meds to help meet that goal.)  Thought Process  Coherency WDL  Content WDL  Delusions None reported or observed  Perception Hallucinations  Hallucination Auditory (Patient reports the voices are muffled - like he can't make out the words. Patient reports they are manageable.)  Judgment Impaired  Confusion None  Danger to Self  Current suicidal ideation? Denies  Self-Injurious Behavior No self-injurious ideation or behavior indicators observed or expressed   Danger to Others  Danger to Others None reported or observed

## 2024-05-14 DIAGNOSIS — F200 Paranoid schizophrenia: Secondary | ICD-10-CM

## 2024-05-14 NOTE — Group Note (Signed)
 Date:  05/14/2024 Time:  11:27 AM  Group Topic/Focus:  Developing a Wellness Toolbox:   The focus of this group is to help patients develop a wellness toolbox with skills and strategies to promote recovery upon discharge.    Participation Level:  Active  Participation Quality:  Appropriate  Affect:  Appropriate  Cognitive:  Appropriate  Insight: Appropriate  Engagement in Group:  Engaged  Modes of Intervention:  Activity  Additional Comments:    Jamiya Nims 05/14/2024, 11:27 AM

## 2024-05-14 NOTE — Progress Notes (Signed)
 Pt visible but note to have minimal peers interaction, and preoccupied.  Denies AVH, delusional and paranoia, anxiety and depression. States, "I am doing fine."    05/13/24 2200  Psych Admission Type (Psych Patients Only)  Admission Status Voluntary  Psychosocial Assessment  Patient Complaints Anger  Eye Contact Fair  Facial Expression Animated  Affect Appropriate to circumstance  Speech Logical/coherent  Interaction Minimal  Motor Activity Slow  Appearance/Hygiene Unremarkable  Behavior Characteristics Cooperative  Mood Pleasant  Thought Process  Coherency WDL  Content WDL  Delusions None reported or observed  Perception Hallucinations  Hallucination Auditory  Judgment Limited  Confusion None  Danger to Self  Current suicidal ideation? Denies  Agreement Not to Harm Self Yes  Description of Agreement Verbal  Danger to Others  Danger to Others None reported or observed    Problem: Education: Goal: Knowledge of [facility] Education information/materials will improve Outcome: Progressing Goal: Emotional status will improve Outcome: Progressing Goal: Mental status will improve Outcome: Progressing Goal: Verbalization of understanding the information provided will improve Outcome: Progressing   Problem: Activity: Goal: Interest or engagement in activities will improve Outcome: Progressing Goal: Sleeping patterns will improve Outcome: Progressing   Problem: Coping: Goal: Ability to verbalize frustrations and anger appropriately will improve Outcome: Progressing Goal: Ability to demonstrate self-control will improve Outcome: Progressing
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
## 2024-05-14 NOTE — Group Note (Signed)
 Recreation Therapy Group Note   Group Topic:Relaxation  Group Date: 05/14/2024 Start Time: 1530 End Time: 1610 Facilitators: Celestia Jeoffrey BRAVO, LRT, CTRS Location: Craft Room  Group Description: PMR (Progressive Muscle Relaxation). LRT educates patients on what PMR is and the benefits that come from it. Patients are asked to sit with their feet flat on the floor while sitting up and all the way back in their chair, if possible. LRT and pts follow a prompt through a speaker that requires you to tense and release different muscles in their body and focus on their breathing. During session, lights are off and soft music is being played.   Goal Area(s) Addressed:  Patients will be able to describe progressive muscle relaxation.  Patient will practice using relaxation technique. Patient will identify a new coping skill.  Patient will follow multistep directions to reduce anxiety and stress.   Affect/Mood: Appropriate   Participation Level: Active   Participation Quality: Independent   Behavior: Appropriate   Speech/Thought Process: Coherent   Insight: Good   Judgement: Good   Modes of Intervention: Activity   Patient Response to Interventions:  Receptive   Education Outcome:  Acknowledges education   Clinical Observations/Individualized Feedback: Cheryle was active in their participation of session activities and group discussion. Pt interacted well with LRT and peers duration of session.    Plan: Continue to engage patient in RT group sessions 2-3x/week.   [address], LRT, CTRS 05/14/2024 5:11 PM

## 2024-05-14 NOTE — Progress Notes (Signed)
 [facility] MD Progress Note  05/14/2024 5:14 PM Damon [name]  MRN:  [id number]   History of Present Illness:  The patient is a 58 year old male with a psychiatric history of paranoid schizophrenia and major depressive disorder, severe with psychotic features, and a medical history of essential hypertension, who presented voluntarily to [facility] reporting auditory hallucinations, sadness, and paranoia. He expressed concern related to loss of custody of his 57 year old son, which he identifies as a significant emotional stressor.  Per Chart Review Patient presented to the emergency department with complaints of hearing voices, seeing things, racing thoughts, paranoia, poor sleep, poor appetite, and approximately 10-pound weight loss. Documentation supports ongoing symptoms of psychosis and mood disturbance, in the context of medication nonadherence due to recent loss of Medicaid.He describes his recent feelings as situational and no longer present.  Subjective:  Chart reviewed, case discussed in multidisciplinary meeting, patient seen during rounds.   6/30:today feels he is doing well. Is alert and oriented. Notes is tolerating risperdal  well has occasional headache. Denies AH/vh. States was paranoid because he lost his medicaid. Is linear on exam is working towards getting medicaid reinstated. Lives by himself. Does not have a provider currently, previously seen at [facility], some concern about being able to pay for care.SABRA He engages well. Has stable income .   6/29: Patient seen today for follow up. He is noted to appear bright and relaxed. He denies any further AH reporting dose of Risperdal  is effective. He denies depression and anxiety. He denies VH and there is no delusional or paranoid thought content. He engages well and has been participating on unit without incident.   05/12/24: Patient seen today for follow up psychiatric interview. Upon approach he is noted to be sitting in  day room participating in group, agreeable for assessment. He states I finally got some sleep and smiles. He reports tolerating restart of medications without any side effect but continues to report AH. He reports mood is calmer and less anxious today. We review symptoms of depression and he denies struggles with mood reporting lots of situational and family stressors but feels he maintains mood well. He voices realizes hallucinations worsen with stressors and that medication noncompliance is primary contributory factor. He denies SI/HI. Discussed plan to continue Risperdal  at same dosage today and titrate as needed if AH do not resolve, he voices understanding and agrees to plan of care.     Sleep: Good  Appetite:  Good  Past Psychiatric History: see h&P Family History:  Family History  Problem Relation Age of Onset   Colon cancer Mother    Hyperlipidemia Father    Heart failure Brother    Diabetes Other    Stroke Other    Social History:  Social History   Substance and Sexual Activity  Alcohol Use Yes   Alcohol/week: 1.0 standard drink of alcohol   Types: 1 Cans of beer per week   Comment: one can of beer weekly     Social History   Substance and Sexual Activity  Drug Use Not Currently   Types: Marijuana    Social History   Socioeconomic History   Marital status: Single    Spouse name: Not on file   Number of children: Not on file   Years of education: Not on file   Highest education level: Not on file  Occupational History   Not on file  Tobacco Use   Smoking status: Every Day    Types: Cigarettes  Smokeless tobacco: Never  Substance and Sexual Activity   Alcohol use: Yes    Alcohol/week: 1.0 standard drink of alcohol    Types: 1 Cans of beer per week    Comment: one can of beer weekly   Drug use: Not Currently    Types: Marijuana   Sexual activity: Yes    Birth control/protection: Condom  Other Topics Concern   Not on file  Social History Narrative    ** Merged History Encounter **       Social Drivers of Health   Financial Resource Strain: Low Risk  (01/13/2023)   Overall Financial Resource Strain (CARDIA)    Difficulty of Paying Living Expenses: Not hard at all  Food Insecurity: No Food Insecurity (05/11/2024)   Hunger Vital Sign    Worried About Running Out of Food in the Last Year: Never true    Ran Out of Food in the Last Year: Never true  Transportation Needs: No Transportation Needs (05/11/2024)   PRAPARE - Administrator, Civil Service (Medical): No    Lack of Transportation (Non-Medical): No  Physical Activity: Sufficiently Active (01/13/2023)   Exercise Vital Sign    Days of Exercise per Week: 5 days    Minutes of Exercise per Session: 60 min  Stress: Stress Concern Present (01/13/2023)   [employer] of Occupational Health - Occupational Stress Questionnaire    Feeling of Stress : To some extent  Social Connections: Socially Isolated (05/11/2024)   Social Connection and Isolation Panel    Frequency of Communication with Friends and Family: Twice a week    Frequency of Social Gatherings with Friends and Family: Once a week    Attends Religious Services: Never    Database administrator or Organizations: No    Attends Engineer, structural: Never    Marital Status: Never married   Past Medical History:  Past Medical History:  Diagnosis Date   Depression    Hypertension    Paranoid schizophrenia (HCC)    Schizophrenia (HCC)    History reviewed. No pertinent surgical history.  Current Medications: Current Facility-Administered Medications  Medication Dose Route Frequency Provider Last Rate Last Admin   acetaminophen  (TYLENOL ) tablet 650 mg  650 mg Oral Q6H PRN Olasunkanmi, Oluwatosin, NP   650 mg at 05/14/24 0830   alum & mag hydroxide-simeth (MAALOX/MYLANTA) 200-200-20 MG/5ML suspension 30 mL  30 mL Oral Q4H PRN Olasunkanmi, Oluwatosin, NP       amLODipine  (NORVASC ) tablet 10 mg  10 mg Oral  Daily Olasunkanmi, Oluwatosin, NP   10 mg at 05/14/24 9173   benazepril  (LOTENSIN ) tablet 10 mg  10 mg Oral Daily Olasunkanmi, Oluwatosin, NP   5 mg at 05/14/24 9040   benztropine  (COGENTIN ) tablet 1 mg  1 mg Oral BID Olasunkanmi, Oluwatosin, NP   1 mg at 05/14/24 9173   haloperidol  (HALDOL ) tablet 5 mg  5 mg Oral TID PRN Olasunkanmi, Oluwatosin, NP       And   diphenhydrAMINE  (BENADRYL ) capsule 50 mg  50 mg Oral TID PRN Olasunkanmi, Oluwatosin, NP       haloperidol  lactate (HALDOL ) injection 5 mg  5 mg Intramuscular TID PRN Olasunkanmi, Oluwatosin, NP       And   diphenhydrAMINE  (BENADRYL ) injection 50 mg  50 mg Intramuscular TID PRN Olasunkanmi, Oluwatosin, NP       And   LORazepam  (ATIVAN ) injection 2 mg  2 mg Intramuscular TID PRN Olasunkanmi, Oluwatosin, NP  haloperidol  lactate (HALDOL ) injection 10 mg  10 mg Intramuscular TID PRN Olasunkanmi, Oluwatosin, NP       And   diphenhydrAMINE  (BENADRYL ) injection 50 mg  50 mg Intramuscular TID PRN Olasunkanmi, Oluwatosin, NP       And   LORazepam  (ATIVAN ) injection 2 mg  2 mg Intramuscular TID PRN Olasunkanmi, Oluwatosin, NP       feeding supplement (ENSURE PLUS HIGH PROTEIN) liquid 237 mL  237 mL Oral BID BM Jadapalle, Sree, MD   237 mL at 05/14/24 1505   hydrOXYzine  (ATARAX ) tablet 50 mg  50 mg Oral TID PRN Olasunkanmi, Oluwatosin, NP       magnesium  hydroxide (MILK OF MAGNESIA) suspension 30 mL  30 mL Oral Daily PRN Olasunkanmi, Oluwatosin, NP       multivitamin with minerals tablet 1 tablet  1 tablet Oral Daily Meegan, Eryn, RPH   1 tablet at 05/14/24 0826   nicotine  (NICODERM CQ  - dosed in mg/24 hours) patch 21 mg  21 mg Transdermal Daily Cleotilde Hoy HERO, NP   21 mg at 05/14/24 9173   nicotine  polacrilex (NICORETTE) gum 2 mg  2 mg Oral PRN Cleotilde Hoy HERO, NP   2 mg at 05/13/24 1722   risperiDONE  (RISPERDAL ) tablet 1.5 mg  1.5 mg Oral BID Olasunkanmi, Oluwatosin, NP   1.5 mg at 05/14/24 9173   traZODone  (DESYREL ) tablet 50 mg  50  mg Oral QHS Olasunkanmi, Oluwatosin, NP   50 mg at 05/13/24 2109    Lab Results:  No results found for this or any previous visit (from the past 48 hours).   Blood Alcohol level:  Lab Results  Component Value Date   ETH <15 05/10/2024   ETH 49 (H) 01/28/2024    Metabolic Disorder Labs: Lab Results  Component Value Date   HGBA1C 5.5 05/10/2024   MPG 111.15 05/10/2024   MPG 116.89 09/11/2023   Lab Results  Component Value Date   PROLACTIN 4.6 05/10/2024   PROLACTIN 55.0 (H) 10/13/2017   Lab Results  Component Value Date   CHOL 221 (H) 05/10/2024   TRIG 86 05/10/2024   HDL 97 05/10/2024   CHOLHDL 2.3 05/10/2024   VLDL 17 05/10/2024   LDLCALC 107 (H) 05/10/2024   LDLCALC 59 09/11/2023      Psychiatric Specialty Exam:  Presentation  General Appearance:  Casual  Eye Contact: Fair  Speech: Clear and Coherent  Speech Volume: Normal    Mood and Affect  Mood: Euthymic  Affect: Congruent   Thought Process  Thought Processes: Coherent; Linear  Descriptions of Associations:Intact  Orientation:Full (Time, Place and Person)  Thought Content:Logical  Hallucinations:Hallucinations: None   Ideas of Reference:None  Suicidal Thoughts:Suicidal Thoughts: No   Homicidal Thoughts:Homicidal Thoughts: No    Sensorium  Memory: Immediate Fair; Recent Fair  Judgment: Fair  Insight: Fair   Art therapist  Concentration: Fair  Attention Span: Good  Recall: Good  Fund of Knowledge: Good  Language: Good   Psychomotor Activity  Psychomotor Activity: Psychomotor Activity: Normal   Musculoskeletal: Strength & Muscle Tone: within normal limits Gait & Station: normal Assets  Assets: Manufacturing systems engineer; Desire for Improvement    Physical Exam: Physical Exam Vitals and nursing note reviewed.  HENT:     Head: Atraumatic.   Eyes:     Extraocular Movements: Extraocular movements intact.   Pulmonary:     Effort:  Pulmonary effort is normal.   Neurological:     Mental Status: He is alert and oriented  to person, place, and time.    Review of Systems  Psychiatric/Behavioral:  Negative for hallucinations, substance abuse and suicidal ideas. The patient is nervous/anxious.    Blood pressure (!) 154/97, pulse 96, temperature 98.2 F (36.8 C), resp. rate 18, height 5' 11 (1.803 m), weight 77.1 kg, SpO2 100%. Body mass index is 23.71 kg/m.  Diagnosis: Principal Problem:   Paranoid schizophrenia (HCC)   PLAN: Safety and Monitoring:  -- Voluntary admission to inpatient psychiatric unit for safety, stabilization and treatment  -- Daily contact with patient to assess and evaluate symptoms and progress in treatment  -- Patient's case to be discussed in multi-disciplinary team meeting  -- Observation Level : q15 minute checks  -- Vital signs:  q12 hours  -- Precautions: suicide, elopement, and assault -- Encouraged patient to participate in unit milieu and in scheduled group therapies  2. Psychiatric Diagnoses and Treatment:   Paranoid Schizophrenia  Medications Risperdal  (Risperidone ) 1.5 mg PO BID - restarted Cogentin  1 mg twice daily continue Trazodone  50 mg nightly continue     -- The risks/benefits/side-effects/alternatives to this medication were discussed in detail with the patient and time was given for questions. The patient consents to medication trial.                -- Metabolic profile and EKG monitoring obtained while on an atypical antipsychotic (BMI: Lipid Panel: HbgA1c: QTc:)              -- Encouraged patient to participate in unit milieu and in scheduled group therapies       3. Medical Issues Being Addressed: No acute needs identified   Continue home meds amlodipine  10 mg daily, benazepril  10 mg daily, NicoDerm CQ  21 mg daily, and multivitamin daily  4. Discharge Planning:   -- Social work and case management to assist with discharge planning and identification of  hospital follow-up needs prior to discharge  -- Estimated LOS: 3-4 days  Donnice FORBES Right, PA-C 05/14/2024, 5:14 PM

## 2024-05-14 NOTE — Group Note (Signed)
 Date:  05/14/2024 Time:  8:20 PM  Group Topic/Focus:  Wellness Toolbox:   The focus of this group is to discuss various aspects of wellness, balancing those aspects and exploring ways to increase the ability to experience wellness.  Patients will create a wellness toolbox for use upon discharge.    Participation Level:  Active  Participation Quality:  Appropriate, Attentive, Sharing, and Supportive  Affect:  Appropriate  Cognitive:  Appropriate  Insight: Appropriate  Engagement in Group:  Engaged  Modes of Intervention:  Discussion  Additional Comments:     Kerri Katz 05/14/2024, 8:20 PM

## 2024-05-14 NOTE — Progress Notes (Signed)
   05/14/24 0900  Psych Admission Type (Psych Patients Only)  Admission Status Voluntary  Psychosocial Assessment  Patient Complaints Anxiety  Eye Contact Brief  Facial Expression Animated  Affect Blunted  Speech Logical/coherent  Interaction Minimal;Guarded  Motor Activity Slow  Appearance/Hygiene Unremarkable  Behavior Characteristics Cooperative  Mood Pleasant (Patient writes his goal for today is to go home. He writes he will take my meds to help reach that goal.)  Thought Process  Coherency WDL  Content WDL  Delusions None reported or observed  Perception Hallucinations  Hallucination Auditory (Patient reports the voices remain muffled and are manageable)  Judgment Impaired  Confusion None  Danger to Self  Current suicidal ideation? Denies  Self-Injurious Behavior No self-injurious ideation or behavior indicators observed or expressed   Danger to Others  Danger to Others None reported or observed

## 2024-05-14 NOTE — Group Note (Signed)
 [facility] LCSW Group Therapy Note    Group Date: 05/14/2024 Start Time: 1300 End Time: 1400  Type of Therapy and Topic:  Group Therapy:  Overcoming Obstacles  Participation Level:  [facility] PARTICIPATION LEVEL: Did Not Attend  Mood:  Description of Group:   In this group patients will be encouraged to explore what they see as obstacles to their own wellness and recovery. They will be guided to discuss their thoughts, feelings, and behaviors related to these obstacles. The group will process together ways to cope with barriers, with attention given to specific choices patients can make. Each patient will be challenged to identify changes they are motivated to make in order to overcome their obstacles. This group will be process-oriented, with patients participating in exploration of their own experiences as well as giving and receiving support and challenge from other group members.  Therapeutic Goals: 1. Patient will identify personal and current obstacles as they relate to admission. 2. Patient will identify barriers that currently interfere with their wellness or overcoming obstacles.  3. Patient will identify feelings, thought process and behaviors related to these barriers. 4. Patient will identify two changes they are willing to make to overcome these obstacles:    Summary of Patient Progress   Patient did not attend group.   Therapeutic Modalities:   Cognitive Behavioral Therapy Solution Focused Therapy Motivational Interviewing Relapse Prevention Therapy   Sherryle JINNY Margo, LCSW

## 2024-05-14 NOTE — Group Note (Signed)
 Recreation Therapy Group Note   Group Topic:Coping Skills  Group Date: 05/14/2024 Start Time: 1045 End Time: 1130 Facilitators: Celestia Jeoffrey BRAVO, LRT, CTRS Location: Craft Room   Group Description: Mind Map.  Patient was provided a blank template of a diagram with 32 blank boxes in a tiered system, branching from the center (similar to a bubble chart). LRT directed patients to label the middle of the diagram Coping Skills. LRT and patients then came up with 8 different coping skills as examples. Pt were directed to record their coping skills in the 2nd tier boxes closest to the center.  Patients would then share their coping skills with the group as LRT wrote them out. LRT gave a handout of 99 different coping skills at the end of group.   Goal Area(s) Addressed: Patients will be able to define "coping skills". Patient will identify new coping skills.  Patient will increase communication.  Affect/Mood: Appropriate   Participation Level: Active and Engaged   Participation Quality: Independent   Behavior: Appropriate, Calm, and Cooperative   Speech/Thought Process: Coherent   Insight: Good   Judgement: Good   Modes of Intervention: Clarification, Education, Worksheet, and Writing   Patient Response to Interventions:  Attentive, Engaged, Interested , and Receptive   Education Outcome:  Acknowledges education   Clinical Observations/Individualized Feedback: Damon [name] was active in their participation of session activities and group discussion. Pt identified camping and football as coping skills. Pt interacted well with LRT and peers duration of session.    Plan: Continue to engage patient in RT group sessions 2-3x/week.   Jeoffrey BRAVO Celestia, LRT, CTRS 05/14/2024 1:20 PM

## 2024-05-14 NOTE — Plan of Care (Signed)

## 2024-05-15 ENCOUNTER — Ambulatory Visit (HOSPITAL_COMMUNITY): Admitting: Psychiatry

## 2024-05-15 DIAGNOSIS — R519 Headache, unspecified: Secondary | ICD-10-CM | POA: Diagnosis present

## 2024-05-15 DIAGNOSIS — Z91018 Allergy to other foods: Secondary | ICD-10-CM | POA: Diagnosis not present

## 2024-05-15 DIAGNOSIS — R634 Abnormal weight loss: Secondary | ICD-10-CM | POA: Diagnosis present

## 2024-05-15 DIAGNOSIS — Z888 Allergy status to other drugs, medicaments and biological substances status: Secondary | ICD-10-CM | POA: Diagnosis not present

## 2024-05-15 DIAGNOSIS — F200 Paranoid schizophrenia: Secondary | ICD-10-CM | POA: Diagnosis present

## 2024-05-15 DIAGNOSIS — Z604 Social exclusion and rejection: Secondary | ICD-10-CM | POA: Diagnosis present

## 2024-05-15 DIAGNOSIS — Z83438 Family history of other disorder of lipoprotein metabolism and other lipidemia: Secondary | ICD-10-CM | POA: Diagnosis not present

## 2024-05-15 DIAGNOSIS — Z79899 Other long term (current) drug therapy: Secondary | ICD-10-CM | POA: Diagnosis not present

## 2024-05-15 DIAGNOSIS — Z823 Family history of stroke: Secondary | ICD-10-CM | POA: Diagnosis not present

## 2024-05-15 DIAGNOSIS — Z91148 Patient's other noncompliance with medication regimen for other reason: Secondary | ICD-10-CM | POA: Diagnosis not present

## 2024-05-15 DIAGNOSIS — F32A Depression, unspecified: Secondary | ICD-10-CM | POA: Diagnosis present

## 2024-05-15 DIAGNOSIS — Z800 Family history of malignant neoplasm of digestive organs: Secondary | ICD-10-CM | POA: Diagnosis not present

## 2024-05-15 DIAGNOSIS — Z8249 Family history of ischemic heart disease and other diseases of the circulatory system: Secondary | ICD-10-CM | POA: Diagnosis not present

## 2024-05-15 DIAGNOSIS — Z833 Family history of diabetes mellitus: Secondary | ICD-10-CM | POA: Diagnosis not present

## 2024-05-15 DIAGNOSIS — R45851 Suicidal ideations: Secondary | ICD-10-CM | POA: Diagnosis present

## 2024-05-15 DIAGNOSIS — Z9151 Personal history of suicidal behavior: Secondary | ICD-10-CM | POA: Diagnosis not present

## 2024-05-15 DIAGNOSIS — I10 Essential (primary) hypertension: Secondary | ICD-10-CM | POA: Diagnosis present

## 2024-05-15 DIAGNOSIS — Z6823 Body mass index (BMI) 23.0-23.9, adult: Secondary | ICD-10-CM | POA: Diagnosis not present

## 2024-05-15 DIAGNOSIS — F17210 Nicotine dependence, cigarettes, uncomplicated: Secondary | ICD-10-CM | POA: Diagnosis present

## 2024-05-15 NOTE — Progress Notes (Signed)
   05/14/24 2000  Psych Admission Type (Psych Patients Only)  Admission Status Voluntary  Psychosocial Assessment  Patient Complaints Anxiety  Eye Contact Fair  Facial Expression Animated  Affect Blunted  Speech Logical/coherent  Interaction Minimal;Guarded  Motor Activity Slow  Appearance/Hygiene Unremarkable  Behavior Characteristics Cooperative  Mood Pleasant  Aggressive Behavior  Effect No apparent injury  Thought Process  Coherency WDL  Content WDL  Delusions None reported or observed  Perception Hallucinations  Hallucination Auditory  Judgment Limited  Confusion None  Danger to Self  Current suicidal ideation? Denies  Self-Injurious Behavior No self-injurious ideation or behavior indicators observed or expressed   Danger to Others  Danger to Others None reported or observed

## 2024-05-15 NOTE — Group Note (Signed)
 LCSW Group Therapy Note   Group Date: 05/15/2024 Start Time: 1300 End Time: 1400   Type of Therapy and Topic:  Group Therapy: Challenging Core Beliefs  Participation Level:  Did Not Attend  Description of Group:  Patients were educated about core beliefs and asked to identify one harmful core belief that they have. Patients were asked to explore from where those beliefs originate. Patients were asked to discuss how those beliefs make them feel and the resulting behaviors of those beliefs. They were then be asked if those beliefs are true and, if so, what evidence they have to support them. Lastly, group members were challenged to replace those negative core beliefs with helpful beliefs.   Therapeutic Goals:   1. Patient will identify harmful core beliefs and explore the origins of such beliefs. 2. Patient will identify feelings and behaviors that result from those core beliefs. 3. Patient will discuss whether such beliefs are true. 4.  Patient will replace harmful core beliefs with helpful ones.  Summary of Patient Progress:  Patient did not attend group.   Therapeutic Modalities: Cognitive Behavioral Therapy; Solution-Focused Therapy   Britnay Magnussen M Eligah Anello, LCSWA 05/15/2024  2:43 PM

## 2024-05-15 NOTE — Progress Notes (Signed)
   05/15/24 1000  Psych Admission Type (Psych Patients Only)  Admission Status Voluntary  Psychosocial Assessment  Patient Complaints None  Eye Contact Fair  Facial Expression Animated  Affect Appropriate to circumstance  Speech Logical/coherent  Interaction Assertive  Motor Activity Slow  Appearance/Hygiene Unremarkable  Behavior Characteristics Cooperative  Mood Pleasant  Aggressive Behavior  Effect No apparent injury  Thought Process  Coherency WDL  Content WDL  Delusions None reported or observed  Perception WDL  Hallucination None reported or observed  Judgment Impaired  Confusion None  Danger to Self  Current suicidal ideation? Denies  Self-Injurious Behavior No self-injurious ideation or behavior indicators observed or expressed   Danger to Others  Danger to Others None reported or observed   Patient rated his depression 1/10 and anxiety 2/10. Visible in the milieu. Appropriate with staff & peers. Support and encouragement given.

## 2024-05-15 NOTE — Plan of Care (Signed)
  Problem: Education: Goal: Knowledge of [facility] Education information/materials will improve Outcome: Progressing Goal: Emotional status will improve Outcome: Progressing   Problem: Activity: Goal: Interest or engagement in activities will improve Outcome: Progressing Goal: Sleeping patterns will improve Outcome: Progressing

## 2024-05-15 NOTE — Progress Notes (Signed)
 [facility] MD Progress Note  05/15/2024 9:58 AM Damon [name]  MRN:  [id number]   History of Present Illness:  The patient is a 58 year old male with a psychiatric history of paranoid schizophrenia and major depressive disorder, severe with psychotic features, and a medical history of essential hypertension, who presented voluntarily to [facility] reporting auditory hallucinations, sadness, and paranoia. He expressed concern related to loss of custody of his 33 year old son, which he identifies as a significant emotional stressor.  Per Chart Review Patient presented to the emergency department with complaints of hearing voices, seeing things, racing thoughts, paranoia, poor sleep, poor appetite, and approximately 10-pound weight loss. Documentation supports ongoing symptoms of psychosis and mood disturbance, in the context of medication nonadherence due to recent loss of Medicaid.He describes his recent feelings as situational and no longer present.  Subjective:  Chart reviewed, case discussed in multidisciplinary meeting, patient seen during rounds.   7/1 Affect is bright is doing well today. Notes he reported AH last night but indcates he meant he was having nightmares.  Denies SI HI and AVH.  He is linear, logical call, and future oriented on exam.  There is no signs of being internally preoccupied noticed there signs of patient responding to internal stimuli. Otherwise feels is doing well no issues with meds. No ongoing paranoia. Stable appetite. Feels the groups have helped him cope and improved self esteem.  Discussed discharge planning likely looking towards Thursday Friday.  Patient is agreeable with this plan.  Demonstrate good insight into need for outpatient follow-up and medication compliance.  6/30:today feels he is doing well. Is alert and oriented. Notes is tolerating risperdal  well has occasional headache. Denies AH/vh. States was paranoid because he lost his medicaid. Is  linear on exam is working towards getting medicaid reinstated. Lives by himself. Does not have a provider currently, previously seen at [facility], some concern about being able to pay for care.SABRA He engages well. Has stable income .   6/29: Patient seen today for follow up. He is noted to appear bright and relaxed. He denies any further AH reporting dose of Risperdal  is effective. He denies depression and anxiety. He denies VH and there is no delusional or paranoid thought content. He engages well and has been participating on unit without incident.   05/12/24: Patient seen today for follow up psychiatric interview. Upon approach he is noted to be sitting in day room participating in group, agreeable for assessment. He states I finally got some sleep and smiles. He reports tolerating restart of medications without any side effect but continues to report AH. He reports mood is calmer and less anxious today. We review symptoms of depression and he denies struggles with mood reporting lots of situational and family stressors but feels he maintains mood well. He voices realizes hallucinations worsen with stressors and that medication noncompliance is primary contributory factor. He denies SI/HI. Discussed plan to continue Risperdal  at same dosage today and titrate as needed if AH do not resolve, he voices understanding and agrees to plan of care.     Sleep: Good  Appetite:  Good  Past Psychiatric History: see h&P Family History:  Family History  Problem Relation Age of Onset   Colon cancer Mother    Hyperlipidemia Father    Heart failure Brother    Diabetes Other    Stroke Other    Social History:  Social History   Substance and Sexual Activity  Alcohol Use Yes   Alcohol/week: 1.0  standard drink of alcohol   Types: 1 Cans of beer per week   Comment: one can of beer weekly     Social History   Substance and Sexual Activity  Drug Use Not Currently   Types: Marijuana    Social History    Socioeconomic History   Marital status: Single    Spouse name: Not on file   Number of children: Not on file   Years of education: Not on file   Highest education level: Not on file  Occupational History   Not on file  Tobacco Use   Smoking status: Every Day    Types: Cigarettes   Smokeless tobacco: Never  Substance and Sexual Activity   Alcohol use: Yes    Alcohol/week: 1.0 standard drink of alcohol    Types: 1 Cans of beer per week    Comment: one can of beer weekly   Drug use: Not Currently    Types: Marijuana   Sexual activity: Yes    Birth control/protection: Condom  Other Topics Concern   Not on file  Social History Narrative   ** Merged History Encounter **       Social Drivers of Health   Financial Resource Strain: Low Risk  (01/13/2023)   Overall Financial Resource Strain (CARDIA)    Difficulty of Paying Living Expenses: Not hard at all  Food Insecurity: No Food Insecurity (05/11/2024)   Hunger Vital Sign    Worried About Running Out of Food in the Last Year: Never true    Ran Out of Food in the Last Year: Never true  Transportation Needs: No Transportation Needs (05/11/2024)   PRAPARE - Administrator, Civil Service (Medical): No    Lack of Transportation (Non-Medical): No  Physical Activity: Sufficiently Active (01/13/2023)   Exercise Vital Sign    Days of Exercise per Week: 5 days    Minutes of Exercise per Session: 60 min  Stress: Stress Concern Present (01/13/2023)   [employer] of Occupational Health - Occupational Stress Questionnaire    Feeling of Stress : To some extent  Social Connections: Socially Isolated (05/11/2024)   Social Connection and Isolation Panel    Frequency of Communication with Friends and Family: Twice a week    Frequency of Social Gatherings with Friends and Family: Once a week    Attends Religious Services: Never    Database administrator or Organizations: No    Attends Engineer, structural: Never     Marital Status: Never married   Past Medical History:  Past Medical History:  Diagnosis Date   Depression    Hypertension    Paranoid schizophrenia (HCC)    Schizophrenia (HCC)    History reviewed. No pertinent surgical history.  Current Medications: Current Facility-Administered Medications  Medication Dose Route Frequency Provider Last Rate Last Admin   acetaminophen  (TYLENOL ) tablet 650 mg  650 mg Oral Q6H PRN Olasunkanmi, Oluwatosin, NP   650 mg at 05/14/24 0830   alum & mag hydroxide-simeth (MAALOX/MYLANTA) 200-200-20 MG/5ML suspension 30 mL  30 mL Oral Q4H PRN Olasunkanmi, Oluwatosin, NP       amLODipine  (NORVASC ) tablet 10 mg  10 mg Oral Daily Olasunkanmi, Oluwatosin, NP   10 mg at 05/15/24 9173   benazepril  (LOTENSIN ) tablet 10 mg  10 mg Oral Daily Olasunkanmi, Oluwatosin, NP   10 mg at 05/15/24 0827   benztropine  (COGENTIN ) tablet 1 mg  1 mg Oral BID Olasunkanmi, Oluwatosin, NP   1 mg  at 05/15/24 0827   haloperidol  (HALDOL ) tablet 5 mg  5 mg Oral TID PRN Olasunkanmi, Oluwatosin, NP       And   diphenhydrAMINE  (BENADRYL ) capsule 50 mg  50 mg Oral TID PRN Olasunkanmi, Oluwatosin, NP       haloperidol  lactate (HALDOL ) injection 5 mg  5 mg Intramuscular TID PRN Olasunkanmi, Oluwatosin, NP       And   diphenhydrAMINE  (BENADRYL ) injection 50 mg  50 mg Intramuscular TID PRN Olasunkanmi, Oluwatosin, NP       And   LORazepam  (ATIVAN ) injection 2 mg  2 mg Intramuscular TID PRN Olasunkanmi, Oluwatosin, NP       haloperidol  lactate (HALDOL ) injection 10 mg  10 mg Intramuscular TID PRN Olasunkanmi, Oluwatosin, NP       And   diphenhydrAMINE  (BENADRYL ) injection 50 mg  50 mg Intramuscular TID PRN Olasunkanmi, Oluwatosin, NP       And   LORazepam  (ATIVAN ) injection 2 mg  2 mg Intramuscular TID PRN Olasunkanmi, Oluwatosin, NP       feeding supplement (ENSURE PLUS HIGH PROTEIN) liquid 237 mL  237 mL Oral BID BM Jadapalle, Sree, MD   237 mL at 05/15/24 0831   hydrOXYzine  (ATARAX ) tablet 50 mg   50 mg Oral TID PRN Olasunkanmi, Oluwatosin, NP       magnesium  hydroxide (MILK OF MAGNESIA) suspension 30 mL  30 mL Oral Daily PRN Olasunkanmi, Oluwatosin, NP       multivitamin with minerals tablet 1 tablet  1 tablet Oral Daily Meegan, Eryn, RPH   1 tablet at 05/15/24 9173   nicotine  (NICODERM CQ  - dosed in mg/24 hours) patch 21 mg  21 mg Transdermal Daily Cleotilde Hoy HERO, NP   21 mg at 05/15/24 0827   nicotine  polacrilex (NICORETTE) gum 2 mg  2 mg Oral PRN Cleotilde Hoy HERO, NP   2 mg at 05/13/24 1722   risperiDONE  (RISPERDAL ) tablet 1.5 mg  1.5 mg Oral BID Olasunkanmi, Oluwatosin, NP   1.5 mg at 05/15/24 9173   traZODone  (DESYREL ) tablet 50 mg  50 mg Oral QHS Olasunkanmi, Oluwatosin, NP   50 mg at 05/14/24 2124    Lab Results:  No results found for this or any previous visit (from the past 48 hours).   Blood Alcohol level:  Lab Results  Component Value Date   ETH <15 05/10/2024   ETH 49 (H) 01/28/2024    Metabolic Disorder Labs: Lab Results  Component Value Date   HGBA1C 5.5 05/10/2024   MPG 111.15 05/10/2024   MPG 116.89 09/11/2023   Lab Results  Component Value Date   PROLACTIN 4.6 05/10/2024   PROLACTIN 55.0 (H) 10/13/2017   Lab Results  Component Value Date   CHOL 221 (H) 05/10/2024   TRIG 86 05/10/2024   HDL 97 05/10/2024   CHOLHDL 2.3 05/10/2024   VLDL 17 05/10/2024   LDLCALC 107 (H) 05/10/2024   LDLCALC 59 09/11/2023      Psychiatric Specialty Exam:  Presentation  General Appearance:  Casual  Eye Contact: Fair  Speech: Clear and Coherent  Speech Volume: Normal    Mood and Affect  Mood: Euthymic  Affect: Congruent   Thought Process  Thought Processes: Coherent; Linear  Descriptions of Associations:Intact  Orientation:Full (Time, Place and Person)  Thought Content:Logical  Hallucinations:Hallucinations: None   Ideas of Reference:None  Suicidal Thoughts:Suicidal Thoughts: No   Homicidal Thoughts:Homicidal Thoughts:  No    Sensorium  Memory: Immediate Fair; Recent Fair  Judgment: Fair  Insight: Fair   Chartered certified accountant: Fair  Attention Span: Good  Recall: Metta Abe of Knowledge: Good  Language: Good   Psychomotor Activity  Psychomotor Activity: Psychomotor Activity: Normal   Musculoskeletal: Strength & Muscle Tone: within normal limits Gait & Station: normal Assets  Assets: Manufacturing systems engineer; Desire for Improvement    Physical Exam: Physical Exam Vitals and nursing note reviewed.  HENT:     Head: Atraumatic.   Eyes:     Extraocular Movements: Extraocular movements intact.   Pulmonary:     Effort: Pulmonary effort is normal.   Neurological:     Mental Status: He is alert and oriented to person, place, and time.    Review of Systems  Psychiatric/Behavioral:  Negative for hallucinations, substance abuse and suicidal ideas. The patient is nervous/anxious.    Blood pressure (!) 152/94, pulse 99, temperature (!) 97.1 F (36.2 C), resp. rate 20, height 5' 11 (1.803 m), weight 77.1 kg, SpO2 99%. Body mass index is 23.71 kg/m.  Diagnosis: Principal Problem:   Paranoid schizophrenia (HCC)   PLAN: Safety and Monitoring:  -- Voluntary admission to inpatient psychiatric unit for safety, stabilization and treatment  -- Daily contact with patient to assess and evaluate symptoms and progress in treatment  -- Patient's case to be discussed in multi-disciplinary team meeting  -- Observation Level : q15 minute checks  -- Vital signs:  q12 hours  -- Precautions: suicide, elopement, and assault -- Encouraged patient to participate in unit milieu and in scheduled group therapies  2. Psychiatric Diagnoses and Treatment:   Paranoid Schizophrenia  Medications Risperdal  (Risperidone ) 1.5 mg PO BID - continue Cogentin  1 mg twice daily continue Trazodone  50 mg nightly continue     -- The risks/benefits/side-effects/alternatives to this  medication were discussed in detail with the patient and time was given for questions. The patient consents to medication trial.                -- Metabolic profile and EKG monitoring obtained while on an atypical antipsychotic (BMI: Lipid Panel: HbgA1c: QTc:)              -- Encouraged patient to participate in unit milieu and in scheduled group therapies       3. Medical Issues Being Addressed: No acute needs identified   Continue home meds amlodipine  10 mg daily, benazepril  10 mg daily, NicoDerm CQ  21 mg daily, and multivitamin daily  4. Discharge Planning:   -- d/c thurs/fri  -- Social work and case management to assist with discharge planning and identification of hospital follow-up needs prior to discharge  -- Estimated LOS: 3-4 days  Donnice FORBES Right, PA-C 05/15/2024, 9:58 AM

## 2024-05-15 NOTE — Group Note (Signed)
 Date:  05/15/2024 Time:  8:44 PM  Group Topic/Focus:  Wrap-Up Group:   The focus of this group is to help patients review their daily goal of treatment and discuss progress on daily workbooks. Patients meditated and discussed positive things happening in life currently.    Participation Level:  Active  Participation Quality:  Appropriate  Affect:  Appropriate  Cognitive:  Appropriate  Insight: Appropriate  Engagement in Group:  Engaged  Modes of Intervention:  Activity  Additional Comments:    Leigh VEAR Pais 05/15/2024, 8:44 PM

## 2024-05-15 NOTE — Group Note (Signed)
 Recreation Therapy Group Note   Group Topic:Stress Management  Group Date: 05/15/2024 Start Time: 1530 End Time: 1620 Facilitators: Celestia Jeoffrey FORBES ARTICE, CTRS Location: Craft Room  Group Description: Taboo. LRT and patients played the game Taboo. The object of the game is to have peers guess the word up at the top of the card drawn that is in bold, while being sure to not use any of the descriptive words down below it on the same card. If the person attempting to explain the word in bold uses any of the descriptive words down below, they lose their turn, and no one receives that card or point. LRT and patient's took turns being the one to describe the words while the rest of the group tried to guess what they were describing.   Goal Area(s) Addressed: Patient will identify physical symptoms of stress. Patient will identify emotional symptoms of stress. Patient will identify coping skills for stress. Patient will build frustration tolerance skills.  Patient will increase communication.   Affect/Mood: N/A   Participation Level: Did not attend    Clinical Observations/Individualized Feedback: Patient did not attend group.   Plan: Continue to engage patient in RT group sessions 2-3x/week.   Jeoffrey FORBES Celestia, LRT, CTRS 05/15/2024 5:26 PM

## 2024-05-15 NOTE — Plan of Care (Signed)
  Problem: Education: Goal: Emotional status will improve Outcome: Progressing Goal: Mental status will improve Outcome: Progressing Goal: Verbalization of understanding the information provided will improve Outcome: Progressing   Problem: Activity: Goal: Interest or engagement in activities will improve Outcome: Progressing   Problem: Coping: Goal: Ability to demonstrate self-control will improve Outcome: Progressing   Problem: Health Behavior/Discharge Planning: Goal: Identification of resources available to assist in meeting health care needs will improve Outcome: Progressing

## 2024-05-15 NOTE — Group Note (Signed)
 Date:  05/15/2024 Time:  10:52 AM  Group Topic/Focus:  Making Healthy Choices:   The focus of this group is to help patients identify negative/unhealthy choices they were using prior to admission and identify positive/healthier coping strategies to replace them upon discharge.    Participation Level:  Active  Participation Quality:  Appropriate  Affect:  Appropriate  Cognitive:  Appropriate  Insight: Appropriate  Engagement in Group:  Engaged  Modes of Intervention:  Activity and Socialization  Additional Comments:    Deitra Caron Mainland 05/15/2024, 10:52 AM

## 2024-05-16 DIAGNOSIS — F200 Paranoid schizophrenia: Secondary | ICD-10-CM | POA: Diagnosis not present

## 2024-05-16 NOTE — Group Note (Signed)
 [facility] LCSW Group Therapy Note   Group Date: 05/16/2024 Start Time: 1300 End Time: 1330   Type of Therapy/Topic:  Group Therapy:  Emotion Regulation  Participation Level:  Active   Mood:  Description of Group:    The purpose of this group is to assist patients in learning to regulate negative emotions and experience positive emotions. Patients will be guided to discuss ways in which they have been vulnerable to their negative emotions. These vulnerabilities will be juxtaposed with experiences of positive emotions or situations, and patients challenged to use positive emotions to combat negative ones. Special emphasis will be placed on coping with negative emotions in conflict situations, and patients will process healthy conflict resolution skills.  Therapeutic Goals: Patient will identify two positive emotions or experiences to reflect on in order to balance out negative emotions:  Patient will label two or more emotions that they find the most difficult to experience:  Patient will be able to demonstrate positive conflict resolution skills through discussion or role plays:   Summary of Patient Progress: Patient was present for the entirety of the group process. He was engaged in the discussion and spoke about warning signs of strong negative emotions. Pt appeared to have some insight into the topic. He appeared open and receptive to feedback/comments from both his peers and the facilitator.   Therapeutic Modalities:   Cognitive Behavioral Therapy Feelings Identification Dialectical Behavioral Therapy   Nadara JONELLE Fam, LCSW

## 2024-05-16 NOTE — Group Note (Signed)
 Date:  05/16/2024 Time:  3:17 PM  Group Topic/Focus:  Wellness Toolbox:   The focus of this group is to discuss various aspects of wellness, balancing those aspects and exploring ways to increase the ability to experience wellness.  Patients will create a wellness toolbox for use upon discharge.    Participation Level:  Did Not Attend    Skippy LITTIE Bennett 05/16/2024, 3:17 PM

## 2024-05-16 NOTE — Plan of Care (Signed)
   Problem: Education: Goal: Knowledge of [facility] Education information/materials will improve Outcome: Progressing Goal: Emotional status will improve Outcome: Progressing Goal: Mental status will improve Outcome: Progressing

## 2024-05-16 NOTE — BH IP Treatment Plan (Signed)
 Interdisciplinary Treatment and Diagnostic Plan Update  05/16/2024 Time of Session: 2:00PM Damon [name] MRN: [id number]  Principal Diagnosis: Paranoid schizophrenia (HCC)  Secondary Diagnoses: Principal Problem:   Paranoid schizophrenia (HCC)   Current Medications:  Current Facility-Administered Medications  Medication Dose Route Frequency Provider Last Rate Last Admin   acetaminophen  (TYLENOL ) tablet 650 mg  650 mg Oral Q6H PRN Olasunkanmi, Oluwatosin, NP   650 mg at 05/14/24 0830   alum & mag hydroxide-simeth (MAALOX/MYLANTA) 200-200-20 MG/5ML suspension 30 mL  30 mL Oral Q4H PRN Olasunkanmi, Oluwatosin, NP       amLODipine  (NORVASC ) tablet 10 mg  10 mg Oral Daily Olasunkanmi, Oluwatosin, NP   10 mg at 05/16/24 9175   benztropine  (COGENTIN ) tablet 1 mg  1 mg Oral BID Olasunkanmi, Oluwatosin, NP   1 mg at 05/16/24 9175   haloperidol  (HALDOL ) tablet 5 mg  5 mg Oral TID PRN Olasunkanmi, Oluwatosin, NP       And   diphenhydrAMINE  (BENADRYL ) capsule 50 mg  50 mg Oral TID PRN Olasunkanmi, Oluwatosin, NP       haloperidol  lactate (HALDOL ) injection 5 mg  5 mg Intramuscular TID PRN Olasunkanmi, Oluwatosin, NP       And   diphenhydrAMINE  (BENADRYL ) injection 50 mg  50 mg Intramuscular TID PRN Olasunkanmi, Oluwatosin, NP       And   LORazepam  (ATIVAN ) injection 2 mg  2 mg Intramuscular TID PRN Olasunkanmi, Oluwatosin, NP       haloperidol  lactate (HALDOL ) injection 10 mg  10 mg Intramuscular TID PRN Olasunkanmi, Oluwatosin, NP       And   diphenhydrAMINE  (BENADRYL ) injection 50 mg  50 mg Intramuscular TID PRN Olasunkanmi, Oluwatosin, NP       And   LORazepam  (ATIVAN ) injection 2 mg  2 mg Intramuscular TID PRN Olasunkanmi, Oluwatosin, NP       feeding supplement (ENSURE PLUS HIGH PROTEIN) liquid 237 mL  237 mL Oral BID BM Jadapalle, Sree, MD   237 mL at 05/16/24 1500   hydrOXYzine  (ATARAX ) tablet 50 mg  50 mg Oral TID PRN Olasunkanmi, Oluwatosin, NP       magnesium  hydroxide (MILK OF  MAGNESIA) suspension 30 mL  30 mL Oral Daily PRN Olasunkanmi, Oluwatosin, NP       multivitamin with minerals tablet 1 tablet  1 tablet Oral Daily Meegan, Eryn, RPH   1 tablet at 05/16/24 0824   nicotine  (NICODERM CQ  - dosed in mg/24 hours) patch 21 mg  21 mg Transdermal Daily Cleotilde Hoy HERO, NP   21 mg at 05/16/24 9176   nicotine  polacrilex (NICORETTE) gum 2 mg  2 mg Oral PRN Cleotilde Hoy HERO, NP   2 mg at 05/13/24 1722   risperiDONE  (RISPERDAL ) tablet 1.5 mg  1.5 mg Oral BID Olasunkanmi, Oluwatosin, NP   1.5 mg at 05/16/24 9176   traZODone  (DESYREL ) tablet 50 mg  50 mg Oral QHS Olasunkanmi, Oluwatosin, NP   50 mg at 05/15/24 2114   PTA Medications: Medications Prior to Admission  Medication Sig Dispense Refill Last Dose/Taking   risperiDONE  (RISPERDAL ) 3 MG tablet Take 1 tablet (3 mg total) by mouth 2 (two) times daily. 60 tablet 0 Past Month   traZODone  (DESYREL ) 50 MG tablet Take 1 tablet (50mg ) by mouth daily AT BEDTIME for Insomnia 30 tablet 0 Past Month   amLODipine  (NORVASC ) 10 MG tablet Take 1 tablet (10 mg total) by mouth daily. 30 tablet 0    benazepril  (LOTENSIN ) 10 MG tablet  Take 1 tablet (10 mg total) by mouth daily. 30 tablet 0 More than a month   diphenhydrAMINE  (BENADRYL ) 25 mg capsule Take 25 mg by mouth every 6 (six) hours as needed for allergies.   More than a month    Patient Stressors: Financial difficulties   Marital or family conflict   Medication change or noncompliance    Patient Strengths: Ability for insight  Motivation for treatment/growth   Treatment Modalities: Medication Management, Group therapy, Case management,  1 to 1 session with clinician, Psychoeducation, Recreational therapy.   Physician Treatment Plan for Primary Diagnosis: Paranoid schizophrenia (HCC) Long Term Goal(s): Improvement in symptoms so as ready for discharge   Short Term Goals: Ability to identify changes in lifestyle to reduce recurrence of condition will improve  Medication  Management: Evaluate patient's response, side effects, and tolerance of medication regimen.  Therapeutic Interventions: 1 to 1 sessions, Unit Group sessions and Medication administration.  Evaluation of Outcomes: Met  Physician Treatment Plan for Secondary Diagnosis: Principal Problem:   Paranoid schizophrenia (HCC)  Long Term Goal(s): Improvement in symptoms so as ready for discharge   Short Term Goals: Ability to identify changes in lifestyle to reduce recurrence of condition will improve     Medication Management: Evaluate patient's response, side effects, and tolerance of medication regimen.  Therapeutic Interventions: 1 to 1 sessions, Unit Group sessions and Medication administration.  Evaluation of Outcomes: Met   RN Treatment Plan for Primary Diagnosis: Paranoid schizophrenia (HCC) Long Term Goal(s): Knowledge of disease and therapeutic regimen to maintain health will improve  Short Term Goals: Ability to verbalize frustration and anger appropriately will improve, Ability to demonstrate self-control, Ability to participate in decision making will improve, Ability to verbalize feelings will improve, Ability to disclose and discuss suicidal ideas, Ability to identify and develop effective coping behaviors will improve, and Compliance with prescribed medications will improve  Medication Management: RN will administer medications as ordered by provider, will assess and evaluate patient's response and provide education to patient for prescribed medication. RN will report any adverse and/or side effects to prescribing provider.  Therapeutic Interventions: 1 on 1 counseling sessions, Psychoeducation, Medication administration, Evaluate responses to treatment, Monitor vital signs and CBGs as ordered, Perform/monitor CIWA, COWS, AIMS and Fall Risk screenings as ordered, Perform wound care treatments as ordered.  Evaluation of Outcomes: Met   LCSW Treatment Plan for Primary Diagnosis:  Paranoid schizophrenia (HCC) Long Term Goal(s): Safe transition to appropriate next level of care at discharge, Engage patient in therapeutic group addressing interpersonal concerns.  Short Term Goals: Engage patient in aftercare planning with referrals and resources, Increase social support, Increase ability to appropriately verbalize feelings, Increase emotional regulation, Facilitate acceptance of mental health diagnosis and concerns, and Increase skills for wellness and recovery  Therapeutic Interventions: Assess for all discharge needs, 1 to 1 time with Social worker, Explore available resources and support systems, Assess for adequacy in community support network, Educate family and significant other(s) on suicide prevention, Complete Psychosocial Assessment, Interpersonal group therapy.  Evaluation of Outcomes: Met   Progress in Treatment: Attending groups: Yes. Participating in groups: Yes. Taking medication as prescribed: Yes. Toleration medication: Yes. Family/Significant other contact made: Yes, individual(s) contacted:  SPE completed with the patient's partner.  Patient understands diagnosis: Yes. Discussing patient identified problems/goals with staff: Yes. Medical problems stabilized or resolved: Yes. Denies suicidal/homicidal ideation: Yes. Issues/concerns per patient self-inventory: No. Other: none  New problem(s) identified: No, Describe:  None  Update 05/16/2024: No changes at this  time.    New Short Term/Long Term Goal(s):detox, elimination of symptoms of psychosis, medication management for mood stabilization; elimination of SI thoughts; development of comprehensive mental wellness/sobriety plan.  Update 05/16/2024: No changes at this time.    Patient Goals:  Get these voices under control. Update 05/16/2024: No changes at this time.    Discharge Plan or Barriers: CSW to assist with the development of appropriate discharge plan.  Update 05/16/2024: Patient reports plans to  return home.  Patient has an aftercare appointment established at this time.    Reason for Continuation of Hospitalization: Anxiety Depression Hallucinations Suicidal ideation   Estimated Length of Stay: 1-7 days. Update 05/16/2024: TBD  Last 3 [place] Suicide Severity Risk Score: Flowsheet Row Admission (Current) from 05/11/2024 in [facility] ED from 05/10/2024 in [facility] ED from 01/29/2024 in [facility]  C-SSRS RISK CATEGORY Error: Q7 should not be populated when Q6 is No Moderate Risk No Risk    Last PHQ 2/9 Scores:    01/31/2024   10:53 AM 01/29/2024    6:50 AM 01/13/2023    9:19 AM  Depression screen PHQ 2/9  Decreased Interest 0 1 2  Down, Depressed, Hopeless 0 1 3  PHQ - 2 Score 0 2 5  Altered sleeping 0 0 3  Tired, decreased energy 1 0 3  Change in appetite 0 0 3  Feeling bad or failure about yourself  0 0 2  Trouble concentrating 1 1 2   Moving slowly or fidgety/restless 0 0 3  Suicidal thoughts 0 0 0  PHQ-9 Score 2 3 21   Difficult doing work/chores Not difficult at all Somewhat difficult Extremely dIfficult    Scribe for Treatment Team: Sherryle JINNY Margo, KEN 05/16/2024 3:40 PM

## 2024-05-16 NOTE — Progress Notes (Signed)
   05/16/24 0824  Psych Admission Type (Psych Patients Only)  Admission Status Voluntary  Psychosocial Assessment  Patient Complaints Anxiety  Eye Contact Fair  Facial Expression Animated  Affect Appropriate to circumstance  Speech Logical/coherent  Interaction Guarded;Minimal  Motor Activity Slow  Appearance/Hygiene Unremarkable  Behavior Characteristics Cooperative  Mood Other (Comment) (pleasant)  Aggressive Behavior  Effect No apparent injury  Thought Process  Coherency WDL  Content WDL  Delusions None reported or observed  Perception Hallucinations  Hallucination Auditory  Judgment Limited  Danger to Self  Current suicidal ideation? Denies  Self-Injurious Behavior No self-injurious ideation or behavior indicators observed or expressed   Agreement Not to Harm Self Yes  Description of Agreement verbal  Danger to Others  Danger to Others None reported or observed

## 2024-05-16 NOTE — Group Note (Signed)
 Date:  05/16/2024 Time:  10:04 PM  Group Topic/Focus:  Wrap-Up Group:   The focus of this group is to help patients review their daily goal of treatment and discuss progress on daily workbooks.    Participation Level:  Active  Participation Quality:  Sharing  Affect:  Appropriate  Cognitive:  Appropriate  Insight: Appropriate  Engagement in Group:  Engaged  Modes of Intervention:  Discussion  Additional Comments:  Patient shared his plan is to discharge in 1 week. His goal today was to stay on his medication and he successfully did.   Kristen H Kamron Portee 05/16/2024, 10:04 PM

## 2024-05-16 NOTE — Group Note (Signed)
 Date:  05/16/2024 Time:  10:03 AM  Group Topic/Focus:  Goals Group:   The focus of this group is to help patients establish daily goals to achieve during treatment and discuss how the patient can incorporate goal setting into their daily lives to aide in recovery.   Participation Level:  Did Not Attend   Skippy LITTIE Bennett 05/16/2024, 10:03 AM

## 2024-05-16 NOTE — Progress Notes (Signed)
 [facility] MD Progress Note  05/16/2024 12:39 PM Damon [name]  MRN:  [id number]   History of Present Illness:  The patient is a 58 year old male with a psychiatric history of paranoid schizophrenia and major depressive disorder, severe with psychotic features, and a medical history of essential hypertension, who presented voluntarily to [facility] reporting auditory hallucinations, sadness, and paranoia. He expressed concern related to loss of custody of his 102 year old son, which he identifies as a significant emotional stressor.  Per Chart Review Patient presented to the emergency department with complaints of hearing voices, seeing things, racing thoughts, paranoia, poor sleep, poor appetite, and approximately 10-pound weight loss. Documentation supports ongoing symptoms of psychosis and mood disturbance, in the context of medication nonadherence due to recent loss of Medicaid.He describes his recent feelings as situational and no longer present.  Subjective:  Chart reviewed, case discussed in multidisciplinary meeting, patient seen during rounds.   7/2: Patient seen for follow-up today.  They are doing well.  They are alert and oriented.  They are linear logical and future oriented.  There are no overt signs of mania or psychosis.  They continue to deny SI, HI, and AVH.  They demonstrate good insight into the need for outpatient follow-up and medication compliance.  They continue to report a plan to return to the residence they endorse living alone.  They are attending group sessions.  They request a razor so that they can shave.  Discussed plan for discharge Thursday or Friday they are agreeable with this plan and voiced no concerns or complaints.  They deny adverse effects of medications.   7/1 Affect is bright is doing well today. Notes he reported AH last night but indcates he meant he was having nightmares.  Denies SI HI and AVH.  He is linear, logical call, and future oriented  on exam.  There is no signs of being internally preoccupied noticed there signs of patient responding to internal stimuli. Otherwise feels is doing well no issues with meds. No ongoing paranoia. Stable appetite. Feels the groups have helped him cope and improved self esteem.  Discussed discharge planning likely looking towards Thursday Friday.  Patient is agreeable with this plan.  Demonstrate good insight into need for outpatient follow-up and medication compliance.  6/30:today feels he is doing well. Is alert and oriented. Notes is tolerating risperdal  well has occasional headache. Denies AH/vh. States was paranoid because he lost his medicaid. Is linear on exam is working towards getting medicaid reinstated. Lives by himself. Does not have a provider currently, previously seen at [facility], some concern about being able to pay for care.SABRA He engages well. Has stable income .   6/29: Patient seen today for follow up. He is noted to appear bright and relaxed. He denies any further AH reporting dose of Risperdal  is effective. He denies depression and anxiety. He denies VH and there is no delusional or paranoid thought content. He engages well and has been participating on unit without incident.   05/12/24: Patient seen today for follow up psychiatric interview. Upon approach he is noted to be sitting in day room participating in group, agreeable for assessment. He states I finally got some sleep and smiles. He reports tolerating restart of medications without any side effect but continues to report AH. He reports mood is calmer and less anxious today. We review symptoms of depression and he denies struggles with mood reporting lots of situational and family stressors but feels he maintains mood well. He  voices realizes hallucinations worsen with stressors and that medication noncompliance is primary contributory factor. He denies SI/HI. Discussed plan to continue Risperdal  at same dosage today and titrate as  needed if AH do not resolve, he voices understanding and agrees to plan of care.     Sleep: Good  Appetite:  Good  Past Psychiatric History: see h&P Family History:  Family History  Problem Relation Age of Onset   Colon cancer Mother    Hyperlipidemia Father    Heart failure Brother    Diabetes Other    Stroke Other    Social History:  Social History   Substance and Sexual Activity  Alcohol Use Yes   Alcohol/week: 1.0 standard drink of alcohol   Types: 1 Cans of beer per week   Comment: one can of beer weekly     Social History   Substance and Sexual Activity  Drug Use Not Currently   Types: Marijuana    Social History   Socioeconomic History   Marital status: Single    Spouse name: Not on file   Number of children: Not on file   Years of education: Not on file   Highest education level: Not on file  Occupational History   Not on file  Tobacco Use   Smoking status: Every Day    Types: Cigarettes   Smokeless tobacco: Never  Substance and Sexual Activity   Alcohol use: Yes    Alcohol/week: 1.0 standard drink of alcohol    Types: 1 Cans of beer per week    Comment: one can of beer weekly   Drug use: Not Currently    Types: Marijuana   Sexual activity: Yes    Birth control/protection: Condom  Other Topics Concern   Not on file  Social History Narrative   ** Merged History Encounter **       Social Drivers of Health   Financial Resource Strain: Low Risk  (01/13/2023)   Overall Financial Resource Strain (CARDIA)    Difficulty of Paying Living Expenses: Not hard at all  Food Insecurity: No Food Insecurity (05/11/2024)   Hunger Vital Sign    Worried About Running Out of Food in the Last Year: Never true    Ran Out of Food in the Last Year: Never true  Transportation Needs: No Transportation Needs (05/11/2024)   PRAPARE - Administrator, Civil Service (Medical): No    Lack of Transportation (Non-Medical): No  Physical Activity: Sufficiently  Active (01/13/2023)   Exercise Vital Sign    Days of Exercise per Week: 5 days    Minutes of Exercise per Session: 60 min  Stress: Stress Concern Present (01/13/2023)   [employer] of Occupational Health - Occupational Stress Questionnaire    Feeling of Stress : To some extent  Social Connections: Socially Isolated (05/11/2024)   Social Connection and Isolation Panel    Frequency of Communication with Friends and Family: Twice a week    Frequency of Social Gatherings with Friends and Family: Once a week    Attends Religious Services: Never    Database administrator or Organizations: No    Attends Engineer, structural: Never    Marital Status: Never married   Past Medical History:  Past Medical History:  Diagnosis Date   Depression    Hypertension    Paranoid schizophrenia (HCC)    Schizophrenia (HCC)    History reviewed. No pertinent surgical history.  Current Medications: Current Facility-Administered Medications  Medication  Dose Route Frequency Provider Last Rate Last Admin   acetaminophen  (TYLENOL ) tablet 650 mg  650 mg Oral Q6H PRN Olasunkanmi, Oluwatosin, NP   650 mg at 05/14/24 0830   alum & mag hydroxide-simeth (MAALOX/MYLANTA) 200-200-20 MG/5ML suspension 30 mL  30 mL Oral Q4H PRN Olasunkanmi, Oluwatosin, NP       amLODipine  (NORVASC ) tablet 10 mg  10 mg Oral Daily Olasunkanmi, Oluwatosin, NP   10 mg at 05/16/24 9175   benztropine  (COGENTIN ) tablet 1 mg  1 mg Oral BID Olasunkanmi, Oluwatosin, NP   1 mg at 05/16/24 9175   haloperidol  (HALDOL ) tablet 5 mg  5 mg Oral TID PRN Olasunkanmi, Oluwatosin, NP       And   diphenhydrAMINE  (BENADRYL ) capsule 50 mg  50 mg Oral TID PRN Olasunkanmi, Oluwatosin, NP       haloperidol  lactate (HALDOL ) injection 5 mg  5 mg Intramuscular TID PRN Olasunkanmi, Oluwatosin, NP       And   diphenhydrAMINE  (BENADRYL ) injection 50 mg  50 mg Intramuscular TID PRN Olasunkanmi, Oluwatosin, NP       And   LORazepam  (ATIVAN ) injection 2 mg   2 mg Intramuscular TID PRN Olasunkanmi, Oluwatosin, NP       haloperidol  lactate (HALDOL ) injection 10 mg  10 mg Intramuscular TID PRN Olasunkanmi, Oluwatosin, NP       And   diphenhydrAMINE  (BENADRYL ) injection 50 mg  50 mg Intramuscular TID PRN Olasunkanmi, Oluwatosin, NP       And   LORazepam  (ATIVAN ) injection 2 mg  2 mg Intramuscular TID PRN Olasunkanmi, Oluwatosin, NP       feeding supplement (ENSURE PLUS HIGH PROTEIN) liquid 237 mL  237 mL Oral BID BM Jadapalle, Sree, MD   237 mL at 05/15/24 1712   hydrOXYzine  (ATARAX ) tablet 50 mg  50 mg Oral TID PRN Olasunkanmi, Oluwatosin, NP       magnesium  hydroxide (MILK OF MAGNESIA) suspension 30 mL  30 mL Oral Daily PRN Olasunkanmi, Oluwatosin, NP       multivitamin with minerals tablet 1 tablet  1 tablet Oral Daily Meegan, Eryn, RPH   1 tablet at 05/16/24 9175   nicotine  (NICODERM CQ  - dosed in mg/24 hours) patch 21 mg  21 mg Transdermal Daily Cleotilde Hoy HERO, NP   21 mg at 05/16/24 9176   nicotine  polacrilex (NICORETTE) gum 2 mg  2 mg Oral PRN Cleotilde Hoy HERO, NP   2 mg at 05/13/24 1722   risperiDONE  (RISPERDAL ) tablet 1.5 mg  1.5 mg Oral BID Olasunkanmi, Oluwatosin, NP   1.5 mg at 05/16/24 9176   traZODone  (DESYREL ) tablet 50 mg  50 mg Oral QHS Olasunkanmi, Oluwatosin, NP   50 mg at 05/15/24 2114    Lab Results:  No results found for this or any previous visit (from the past 48 hours).   Blood Alcohol level:  Lab Results  Component Value Date   ETH <15 05/10/2024   ETH 49 (H) 01/28/2024    Metabolic Disorder Labs: Lab Results  Component Value Date   HGBA1C 5.5 05/10/2024   MPG 111.15 05/10/2024   MPG 116.89 09/11/2023   Lab Results  Component Value Date   PROLACTIN 4.6 05/10/2024   PROLACTIN 55.0 (H) 10/13/2017   Lab Results  Component Value Date   CHOL 221 (H) 05/10/2024   TRIG 86 05/10/2024   HDL 97 05/10/2024   CHOLHDL 2.3 05/10/2024   VLDL 17 05/10/2024   LDLCALC 107 (H) 05/10/2024   LDLCALC  59 09/11/2023       Psychiatric Specialty Exam:  Presentation  General Appearance:  Casual  Eye Contact: Fair  Speech: Clear and Coherent  Speech Volume: Normal    Mood and Affect  Mood: Euthymic  Affect: Congruent   Thought Process  Thought Processes: Coherent  Descriptions of Associations:Intact  Orientation:Full (Time, Place and Person)  Thought Content:Logical; WDL  Hallucinations:Hallucinations: None   Ideas of Reference:None  Suicidal Thoughts:Suicidal Thoughts: No   Homicidal Thoughts:Homicidal Thoughts: No    Sensorium  Memory: Immediate Fair; Recent Fair  Judgment: Fair  Insight: Fair   Art therapist  Concentration: Fair  Attention Span: Good  Recall: Good  Fund of Knowledge: Good  Language: Good   Psychomotor Activity  Psychomotor Activity: Psychomotor Activity: Normal   Musculoskeletal: Strength & Muscle Tone: within normal limits Gait & Station: normal Assets  Assets: Manufacturing systems engineer; Desire for Improvement    Physical Exam: Physical Exam Vitals and nursing note reviewed.  HENT:     Head: Atraumatic.  Eyes:     Extraocular Movements: Extraocular movements intact.  Pulmonary:     Effort: Pulmonary effort is normal.  Neurological:     Mental Status: He is alert and oriented to person, place, and time.    Review of Systems  Psychiatric/Behavioral:  Negative for hallucinations, substance abuse and suicidal ideas. The patient is nervous/anxious.    Blood pressure (!) 149/100, pulse 89, temperature 98 F (36.7 C), resp. rate 20, height 5' 11 (1.803 m), weight 77.1 kg, SpO2 100%. Body mass index is 23.71 kg/m.  Diagnosis: Principal Problem:   Paranoid schizophrenia (HCC)   PLAN: Safety and Monitoring:  -- Voluntary admission to inpatient psychiatric unit for safety, stabilization and treatment  -- Daily contact with patient to assess and evaluate symptoms and progress in treatment  -- Patient's  case to be discussed in multi-disciplinary team meeting  -- Observation Level : q15 minute checks  -- Vital signs:  q12 hours  -- Precautions: suicide, elopement, and assault -- Encouraged patient to participate in unit milieu and in scheduled group therapies  2. Psychiatric Diagnoses and Treatment:   Paranoid Schizophrenia  Medications Risperdal  (Risperidone ) 1.5 mg PO BID - continue Cogentin  1 mg twice daily continue Trazodone  50 mg nightly continue     -- The risks/benefits/side-effects/alternatives to this medication were discussed in detail with the patient and time was given for questions. The patient consents to medication trial.                -- Metabolic profile and EKG monitoring obtained while on an atypical antipsychotic (BMI: Lipid Panel: HbgA1c: QTc:)              -- Encouraged patient to participate in unit milieu and in scheduled group therapies       3. Medical Issues Being Addressed: No acute needs identified   Continue home meds amlodipine  10 mg daily, benazepril  10 mg daily, NicoDerm CQ  21 mg daily, and multivitamin daily  4. Discharge Planning:   -- d/c thurs/fri  -- Social work and case management to assist with discharge planning and identification of hospital follow-up needs prior to discharge  -- Estimated LOS: 3-4 days  Donnice FORBES Right, PA-C 05/16/2024, 12:39 PM

## 2024-05-16 NOTE — Progress Notes (Signed)
 Pt cut himself on adam's apple of anterior neck.  Area the size of 1/2 a green pea.  Provided pt with 2X2 gauze and 2 alcohol pads and pt used in RN's presence.  No active bleeding after that.

## 2024-05-16 NOTE — Plan of Care (Signed)

## 2024-05-17 DIAGNOSIS — F200 Paranoid schizophrenia: Secondary | ICD-10-CM | POA: Diagnosis not present

## 2024-05-17 MED ORDER — BENAZEPRIL HCL 5 MG PO TABS
10.0000 mg | ORAL_TABLET | Freq: Every day | ORAL | Status: DC
  Administered 2024-05-17 – 2024-05-18 (×2): 10 mg via ORAL
  Filled 2024-05-17: qty 2
  Filled 2024-05-17: qty 1
  Filled 2024-05-17: qty 2

## 2024-05-17 MED ORDER — TRAZODONE HCL 50 MG PO TABS: 50.0000 mg | ORAL_TABLET | Freq: Every evening | ORAL | Status: DC | PRN

## 2024-05-17 MED ORDER — AMLODIPINE BESYLATE 5 MG PO TABS
10.0000 mg | ORAL_TABLET | Freq: Every day | ORAL | Status: DC
  Administered 2024-05-18: 10 mg via ORAL
  Filled 2024-05-17: qty 2

## 2024-05-17 MED ORDER — RISPERIDONE 1 MG PO TABS
1.5000 mg | ORAL_TABLET | Freq: Two times a day (BID) | ORAL | Status: DC
  Administered 2024-05-18: 1.5 mg via ORAL
  Filled 2024-05-17 (×2): qty 2

## 2024-05-17 MED ORDER — BENZTROPINE MESYLATE 1 MG PO TABS
1.0000 mg | ORAL_TABLET | Freq: Two times a day (BID) | ORAL | Status: DC
  Administered 2024-05-18: 1 mg via ORAL
  Filled 2024-05-17 (×2): qty 1

## 2024-05-17 NOTE — Group Note (Signed)
 Recreation Therapy Group Note   Group Topic:Health and Wellness  Group Date: 05/17/2024 Start Time: 0950 End Time: 1050 Facilitators: Celestia Jeoffrey BRAVO, LRT, CTRS Location: Courtyard  Group Description: [employer]. LRT and patients played games of basketball, drew with chalk, and played corn hole while outside in the courtyard while getting fresh air and sunlight. Music was being played in the background. LRT and peers conversed about different games they have played before, what they do in their free time and anything else that is on their minds. LRT encouraged pts to drink water after being outside, sweating and getting their heart rate up.  Goal Area(s) Addressed: Patient will build on frustration tolerance skills. Patients will partake in a competitive play game with peers. Patients will gain knowledge of new leisure interest/hobby.    Affect/Mood: Appropriate   Participation Level: Active and Engaged   Participation Quality: Independent   Behavior: Appropriate, Calm, and Cooperative   Speech/Thought Process: Coherent   Insight: Good   Judgement: Good   Modes of Intervention: Activity   Patient Response to Interventions:  Attentive and Receptive   Education Outcome:  Acknowledges education   Clinical Observations/Individualized Feedback: Geoffry was active in their participation of session activities and group discussion. Pt interacted well with LRT and peers duration of session.    Plan: Continue to engage patient in RT group sessions 2-3x/week.   Jeoffrey BRAVO Celestia, LRT, CTRS 05/17/2024 11:01 AM

## 2024-05-17 NOTE — Group Note (Signed)
 Date:  05/17/2024 Time:  11:06 AM  Group Topic/Focus:  Self Care:   The focus of this group is to help patients understand the importance of self-care in order to improve or restore emotional, physical, spiritual, interpersonal, and financial health.    Participation Level:  Active  Participation Quality:  Appropriate  Affect:  Appropriate  Cognitive:  Appropriate  Insight: Appropriate  Engagement in Group:  Engaged  Modes of Intervention:  Activity  Additional Comments:    Camellia HERO Nyree Applegate 05/17/2024, 11:06 AM

## 2024-05-17 NOTE — Plan of Care (Signed)

## 2024-05-17 NOTE — [facility] Counselor (Signed)
 CSW spoke with the patient's partner regarding upcoming discharge.  Partner reports no concerns at this time.   Sherryle Margo, MSW, LCSW 05/17/2024 11:46 AM

## 2024-05-17 NOTE — Progress Notes (Signed)
 Pt withdrawn to room this shift.  Pt denied AH adding "I have not heard voice since last week."  When asked about his plan to avoid reoccurrence of the voices, Pt remarked, "To keep taking my medications."  He further explained that his social worker is working on setting him up for out patient care.  Pt denied SI/HI, depression and anxiety.    05/17/24 0000  Psych Admission Type (Psych Patients Only)  Admission Status Voluntary  Psychosocial Assessment  Patient Complaints None  Eye Contact Fair  Facial Expression Animated  Affect Appropriate to circumstance  Speech Logical/coherent  Interaction Guarded;Minimal  Motor Activity Slow  Appearance/Hygiene Unremarkable  Behavior Characteristics Cooperative;Appropriate to situation  Mood Pleasant  Aggressive Behavior  Effect No apparent injury  Thought Process  Coherency WDL  Content WDL  Delusions None reported or observed  Perception WDL  Hallucination None reported or observed  Judgment Limited  Confusion None  Danger to Self  Current suicidal ideation? Denies  Agreement Not to Harm Self Yes  Description of Agreement Verbal  Danger to Others  Danger to Others None reported or observed    Problem: Education: Goal: Knowledge of [facility] Education information/materials will improve Outcome: Progressing Goal: Emotional status will improve Outcome: Progressing Goal: Mental status will improve Outcome: Progressing Goal: Verbalization of understanding the information provided will improve Outcome: Progressing   Problem: Activity: Goal: Interest or engagement in activities will improve Outcome: Progressing Goal: Sleeping patterns will improve Outcome: Progressing   Problem: Coping: Goal: Ability to verbalize frustrations and anger appropriately will improve Outcome: Progressing Goal: Ability to demonstrate self-control will improve Outcome: Progressing

## 2024-05-17 NOTE — Progress Notes (Signed)
   05/17/24 0832  Psych Admission Type (Psych Patients Only)  Admission Status Voluntary  Psychosocial Assessment  Patient Complaints None  Eye Contact Fair  Facial Expression Animated  Affect Appropriate to circumstance  Speech Logical/coherent  Interaction Guarded;Minimal  Motor Activity Slow  Appearance/Hygiene Unremarkable  Behavior Characteristics Cooperative;Appropriate to situation  Mood Pleasant  Aggressive Behavior  Effect No apparent injury  Thought Process  Coherency WDL  Content WDL  Delusions None reported or observed  Perception WDL  Hallucination None reported or observed  Judgment Poor  Confusion None  Danger to Self  Current suicidal ideation? Denies  Description of Suicide Plan no plan  Self-Injurious Behavior No self-injurious ideation or behavior indicators observed or expressed   Agreement Not to Harm Self Yes  Description of Agreement verbal  Danger to Others  Danger to Others None reported or observed

## 2024-05-17 NOTE — Group Note (Signed)
 [facility] LCSW Group Therapy Note   Group Date: 05/17/2024 Start Time: 1300 End Time: 1400   Type of Therapy/Topic:  Group Therapy:  Balance in Life  Participation Level:  Active   Description of Group:    This group will address the concept of balance and how it feels and looks when one is unbalanced. Patients will be encouraged to process areas in their lives that are out of balance, and identify reasons for remaining unbalanced. Facilitators will guide patients utilizing problem- solving interventions to address and correct the stressor making their life unbalanced. Understanding and applying boundaries will be explored and addressed for obtaining  and maintaining a balanced life. Patients will be encouraged to explore ways to assertively make their unbalanced needs known to significant others in their lives, using other group members and facilitator for support and feedback.  Therapeutic Goals: Patient will identify two or more emotions or situations they have that consume much of in their lives. Patient will identify signs/triggers that life has become out of balance:  Patient will identify two ways to set boundaries in order to achieve balance in their lives:  Patient will demonstrate ability to communicate their needs through discussion and/or role plays  Summary of Patient Progress: Patient was present in group.  Patient was an active participant.  Patient shared how he has appropriately handled moments when he felt off balance.  He reported how he thinks taking medications and following through with therapy recommendations are a benefit for himself but others in general in taking steps to maintain balance.  Pt was appropriate and insightful in group.  Insight was fair.   Therapeutic Modalities:   Cognitive Behavioral Therapy Solution-Focused Therapy Assertiveness Training   Sherryle JINNY Margo, LCSW

## 2024-05-17 NOTE — Group Note (Signed)
 Recreation Therapy Group Note   Group Topic:Healthy Support Systems  Group Date: 05/17/2024 Start Time: 1530 End Time: 1620 Facilitators: Celestia Jeoffrey FORBES ARTICE, CTRS Location: Craft Room  Group Description: Straw Bridge. In groups or individually, patients were given 10 plastic drinking straws and an equal length of masking tape. Using the materials provided, patients were instructed to build a free-standing bridge-like structure to suspend an everyday item (ex: deck of cards) off the floor or table surface. All materials were required to be used in Secondary school teacher. LRT facilitated post-activity discussion reviewing the importance of having strong and healthy support systems in our lives. LRT discussed how the people in our lives serve as the tape and the deck of cards we placed on top of our straw structure are the stressors we face in daily life. LRT and pts discussed what happens in our life when things get too heavy for us , and we don't have strong supports outside of the hospital. Pt shared 2 of their healthy supports in their life aloud in the group.   Goal Area(s) Addressed:  Patient will identify 2 healthy supports in their life. Patient will identify skills to successfully complete activity. Patient will identify correlation of this activity to life post-discharge.  Patient will build on frustration tolerance skills. Patient will increase team building and communication skills.    Affect/Mood: Appropriate   Participation Level: Moderate   Participation Quality: Independent   Behavior: Calm and Cooperative   Speech/Thought Process: Coherent   Insight: Fair   Judgement: Fair    Modes of Intervention: STEM Activity   Patient Response to Interventions:  Attentive, Engaged, Interested , and Receptive   Education Outcome:  Acknowledges education   Clinical Observations/Individualized Feedback: Damon [name] was active in their participation of session activities and group discussion. Pt  identified my dad and my best friend as supports. Pt interacted well with LRT and peers duration of session.    Plan: Continue to engage patient in RT group sessions 2-3x/week.   Jeoffrey FORBES Celestia, LRT, CTRS 05/17/2024 5:13 PM

## 2024-05-17 NOTE — Progress Notes (Signed)
 [facility] MD Progress Note  05/17/2024 4:09 PM KRISTI HYER  MRN:  [id number]   History of Present Illness:  The patient is a 58 year old male with a psychiatric history of paranoid schizophrenia and major depressive disorder, severe with psychotic features, and a medical history of essential hypertension, who presented voluntarily to [facility] reporting auditory hallucinations, sadness, and paranoia. He expressed concern related to loss of custody of his 36 year old son, which he identifies as a significant emotional stressor.  Per Chart Review Patient presented to the emergency department with complaints of hearing voices, seeing things, racing thoughts, paranoia, poor sleep, poor appetite, and approximately 10-pound weight loss. Documentation supports ongoing symptoms of psychosis and mood disturbance, in the context of medication nonadherence due to recent loss of Medicaid.He describes his recent feelings as situational and no longer present.  Subjective:  Chart reviewed, case discussed in multidisciplinary meeting, patient seen during rounds.   7/3: Patient is found outside engaging with other patients and staff.  Affect is bright.  They are not observed to be internally preoccupied nor they observed responding to internal stimuli.  They deny SI, HI, and AVH.  They continue to demonstrate good insight to the need for outpatient follow-up and continued medication compliance.  There are no overt signs of mania or psychosis.  They note stable mood appetite and sleep.  They deny depression or anxiety.  They report they are excited to get home and get back to life.  They voiced no concerns or complaints they deny adverse effects of medications.  Plan for discharge tomorrow.  Patient is agreeable with this plan.  Collateral contact by social work no safety concerns.  7/2: Patient seen for follow-up today.  They are doing well.  They are alert and oriented.  They are linear logical and  future oriented.  There are no overt signs of mania or psychosis.  They continue to deny SI, HI, and AVH.  They demonstrate good insight into the need for outpatient follow-up and medication compliance.  They continue to report a plan to return to the residence they endorse living alone.  They are attending group sessions.  They request a razor so that they can shave.  Discussed plan for discharge Thursday or Friday they are agreeable with this plan and voiced no concerns or complaints.  They deny adverse effects of medications.   7/1 Affect is bright is doing well today. Notes he reported AH last night but indcates he meant he was having nightmares.  Denies SI HI and AVH.  He is linear, logical call, and future oriented on exam.  There is no signs of being internally preoccupied noticed there signs of patient responding to internal stimuli. Otherwise feels is doing well no issues with meds. No ongoing paranoia. Stable appetite. Feels the groups have helped him cope and improved self esteem.  Discussed discharge planning likely looking towards Thursday Friday.  Patient is agreeable with this plan.  Demonstrate good insight into need for outpatient follow-up and medication compliance.  6/30:today feels he is doing well. Is alert and oriented. Notes is tolerating risperdal  well has occasional headache. Denies AH/vh. States was paranoid because he lost his medicaid. Is linear on exam is working towards getting medicaid reinstated. Lives by himself. Does not have a provider currently, previously seen at [facility], some concern about being able to pay for care.SABRA He engages well. Has stable income .   6/29: Patient seen today for follow up. He is noted to appear  bright and relaxed. He denies any further AH reporting dose of Risperdal  is effective. He denies depression and anxiety. He denies VH and there is no delusional or paranoid thought content. He engages well and has been participating on unit without incident.    05/12/24: Patient seen today for follow up psychiatric interview. Upon approach he is noted to be sitting in day room participating in group, agreeable for assessment. He states I finally got some sleep and smiles. He reports tolerating restart of medications without any side effect but continues to report AH. He reports mood is calmer and less anxious today. We review symptoms of depression and he denies struggles with mood reporting lots of situational and family stressors but feels he maintains mood well. He voices realizes hallucinations worsen with stressors and that medication noncompliance is primary contributory factor. He denies SI/HI. Discussed plan to continue Risperdal  at same dosage today and titrate as needed if AH do not resolve, he voices understanding and agrees to plan of care.     Sleep: Good  Appetite:  Good  Past Psychiatric History: see h&P Family History:  Family History  Problem Relation Age of Onset   Colon cancer Mother    Hyperlipidemia Father    Heart failure Brother    Diabetes Other    Stroke Other    Social History:  Social History   Substance and Sexual Activity  Alcohol Use Yes   Alcohol/week: 1.0 standard drink of alcohol   Types: 1 Cans of beer per week   Comment: one can of beer weekly     Social History   Substance and Sexual Activity  Drug Use Not Currently   Types: Marijuana    Social History   Socioeconomic History   Marital status: Single    Spouse name: Not on file   Number of children: Not on file   Years of education: Not on file   Highest education level: Not on file  Occupational History   Not on file  Tobacco Use   Smoking status: Every Day    Types: Cigarettes   Smokeless tobacco: Never  Substance and Sexual Activity   Alcohol use: Yes    Alcohol/week: 1.0 standard drink of alcohol    Types: 1 Cans of beer per week    Comment: one can of beer weekly   Drug use: Not Currently    Types: Marijuana   Sexual  activity: Yes    Birth control/protection: Condom  Other Topics Concern   Not on file  Social History Narrative   ** Merged History Encounter **       Social Drivers of Health   Financial Resource Strain: Low Risk  (01/13/2023)   Overall Financial Resource Strain (CARDIA)    Difficulty of Paying Living Expenses: Not hard at all  Food Insecurity: No Food Insecurity (05/11/2024)   Hunger Vital Sign    Worried About Running Out of Food in the Last Year: Never true    Ran Out of Food in the Last Year: Never true  Transportation Needs: No Transportation Needs (05/11/2024)   PRAPARE - Administrator, Civil Service (Medical): No    Lack of Transportation (Non-Medical): No  Physical Activity: Sufficiently Active (01/13/2023)   Exercise Vital Sign    Days of Exercise per Week: 5 days    Minutes of Exercise per Session: 60 min  Stress: Stress Concern Present (01/13/2023)   [employer] of Occupational Health - Occupational Stress Questionnaire  Feeling of Stress : To some extent  Social Connections: Socially Isolated (05/11/2024)   Social Connection and Isolation Panel    Frequency of Communication with Friends and Family: Twice a week    Frequency of Social Gatherings with Friends and Family: Once a week    Attends Religious Services: Never    Database administrator or Organizations: No    Attends Engineer, structural: Never    Marital Status: Never married   Past Medical History:  Past Medical History:  Diagnosis Date   Depression    Hypertension    Paranoid schizophrenia (HCC)    Schizophrenia (HCC)    History reviewed. No pertinent surgical history.  Current Medications: Current Facility-Administered Medications  Medication Dose Route Frequency Provider Last Rate Last Admin   acetaminophen  (TYLENOL ) tablet 650 mg  650 mg Oral Q6H PRN Olasunkanmi, Oluwatosin, NP   650 mg at 05/14/24 0830   alum & mag hydroxide-simeth (MAALOX/MYLANTA) 200-200-20 MG/5ML  suspension 30 mL  30 mL Oral Q4H PRN Olasunkanmi, Oluwatosin, NP       benztropine  (COGENTIN ) tablet 1 mg  1 mg Oral BID Olasunkanmi, Oluwatosin, NP   1 mg at 05/17/24 9167   haloperidol  (HALDOL ) tablet 5 mg  5 mg Oral TID PRN Olasunkanmi, Oluwatosin, NP       And   diphenhydrAMINE  (BENADRYL ) capsule 50 mg  50 mg Oral TID PRN Olasunkanmi, Oluwatosin, NP       haloperidol  lactate (HALDOL ) injection 5 mg  5 mg Intramuscular TID PRN Olasunkanmi, Oluwatosin, NP       And   diphenhydrAMINE  (BENADRYL ) injection 50 mg  50 mg Intramuscular TID PRN Olasunkanmi, Oluwatosin, NP       And   LORazepam  (ATIVAN ) injection 2 mg  2 mg Intramuscular TID PRN Olasunkanmi, Oluwatosin, NP       haloperidol  lactate (HALDOL ) injection 10 mg  10 mg Intramuscular TID PRN Olasunkanmi, Oluwatosin, NP       And   diphenhydrAMINE  (BENADRYL ) injection 50 mg  50 mg Intramuscular TID PRN Olasunkanmi, Oluwatosin, NP       And   LORazepam  (ATIVAN ) injection 2 mg  2 mg Intramuscular TID PRN Olasunkanmi, Oluwatosin, NP       feeding supplement (ENSURE PLUS HIGH PROTEIN) liquid 237 mL  237 mL Oral BID BM Jadapalle, Sree, MD   237 mL at 05/17/24 1400   hydrOXYzine  (ATARAX ) tablet 50 mg  50 mg Oral TID PRN Olasunkanmi, Oluwatosin, NP       magnesium  hydroxide (MILK OF MAGNESIA) suspension 30 mL  30 mL Oral Daily PRN Olasunkanmi, Oluwatosin, NP       multivitamin with minerals tablet 1 tablet  1 tablet Oral Daily Meegan, Eryn, RPH   1 tablet at 05/17/24 0831   nicotine  (NICODERM CQ  - dosed in mg/24 hours) patch 21 mg  21 mg Transdermal Daily Cleotilde Hoy HERO, NP   21 mg at 05/17/24 9165   nicotine  polacrilex (NICORETTE) gum 2 mg  2 mg Oral PRN Cleotilde Hoy HERO, NP   2 mg at 05/13/24 1722   risperiDONE  (RISPERDAL ) tablet 1.5 mg  1.5 mg Oral BID Olasunkanmi, Oluwatosin, NP   1.5 mg at 05/17/24 9167   traZODone  (DESYREL ) tablet 50 mg  50 mg Oral QHS Olasunkanmi, Oluwatosin, NP   50 mg at 05/16/24 2114    Lab Results:  No results  found for this or any previous visit (from the past 48 hours).   Blood Alcohol level:  Lab Results  Component Value Date   ETH <15 05/10/2024   ETH 49 (H) 01/28/2024    Metabolic Disorder Labs: Lab Results  Component Value Date   HGBA1C 5.5 05/10/2024   MPG 111.15 05/10/2024   MPG 116.89 09/11/2023   Lab Results  Component Value Date   PROLACTIN 4.6 05/10/2024   PROLACTIN 55.0 (H) 10/13/2017   Lab Results  Component Value Date   CHOL 221 (H) 05/10/2024   TRIG 86 05/10/2024   HDL 97 05/10/2024   CHOLHDL 2.3 05/10/2024   VLDL 17 05/10/2024   LDLCALC 107 (H) 05/10/2024   LDLCALC 59 09/11/2023      Psychiatric Specialty Exam:  Presentation  General Appearance:  Casual  Eye Contact: Fair  Speech: Clear and Coherent  Speech Volume: Normal    Mood and Affect  Mood: Euthymic  Affect: Congruent   Thought Process  Thought Processes: Coherent  Descriptions of Associations:Intact  Orientation:Full (Time, Place and Person)  Thought Content:Logical; WDL  Hallucinations:Hallucinations: None   Ideas of Reference:None  Suicidal Thoughts:Suicidal Thoughts: No   Homicidal Thoughts:Homicidal Thoughts: No    Sensorium  Memory: Immediate Fair; Recent Fair  Judgment: Fair  Insight: Fair   Art therapist  Concentration: Fair  Attention Span: Good  Recall: Good  Fund of Knowledge: Good  Language: Good   Psychomotor Activity  Psychomotor Activity: Psychomotor Activity: Normal   Musculoskeletal: Strength & Muscle Tone: within normal limits Gait & Station: normal Assets  Assets: Manufacturing systems engineer; Desire for Improvement    Physical Exam: Physical Exam Vitals and nursing note reviewed.  HENT:     Head: Atraumatic.  Eyes:     Extraocular Movements: Extraocular movements intact.  Pulmonary:     Effort: Pulmonary effort is normal.  Neurological:     Mental Status: He is alert and oriented to person, place,  and time.  Psychiatric:        Mood and Affect: Mood normal.        Behavior: Behavior normal.        Thought Content: Thought content normal.        Judgment: Judgment normal.    Review of Systems  Psychiatric/Behavioral:  Negative for hallucinations, substance abuse and suicidal ideas. The patient is not nervous/anxious and does not have insomnia.    Blood pressure (!) 133/92, pulse (!) 108, temperature 98 F (36.7 C), resp. rate 20, height 5' 11 (1.803 m), weight 77.1 kg, SpO2 100%. Body mass index is 23.71 kg/m.  Diagnosis: Principal Problem:   Paranoid schizophrenia (HCC)   PLAN: Safety and Monitoring:  -- Voluntary admission to inpatient psychiatric unit for safety, stabilization and treatment  -- Daily contact with patient to assess and evaluate symptoms and progress in treatment  -- Patient's case to be discussed in multi-disciplinary team meeting  -- Observation Level : q15 minute checks  -- Vital signs:  q12 hours  -- Precautions: suicide, elopement, and assault -- Encouraged patient to participate in unit milieu and in scheduled group therapies  2. Psychiatric Diagnoses and Treatment:   Paranoid Schizophrenia  Medications Risperdal  (Risperidone ) 1.5 mg PO BID - continue Cogentin  1 mg twice daily continue Trazodone  50 mg nightly continue     -- The risks/benefits/side-effects/alternatives to this medication were discussed in detail with the patient and time was given for questions. The patient consents to medication trial.                -- Metabolic profile and EKG monitoring obtained while on  an atypical antipsychotic (BMI: Lipid Panel: HbgA1c: QTc:)              -- Encouraged patient to participate in unit milieu and in scheduled group therapies       3. Medical Issues Being Addressed: No acute needs identified   Continue home meds amlodipine  10 mg daily, benazepril  10 mg daily, NicoDerm CQ  21 mg daily, and multivitamin daily  4. Discharge Planning:    -- d/c fri  -- Social work and case management to assist with discharge planning and identification of hospital follow-up needs prior to discharge  -- Estimated LOS: 3-4 days  Donnice FORBES Right, PA-C 05/17/2024, 4:09 PM

## 2024-05-17 NOTE — Progress Notes (Signed)
   05/17/24 2030  Psych Admission Type (Psych Patients Only)  Admission Status Voluntary  Psychosocial Assessment  Patient Complaints None  Eye Contact Fair  Facial Expression Animated  Affect Appropriate to circumstance  Speech Logical/coherent  Interaction Guarded;Minimal  Motor Activity Slow  Appearance/Hygiene Unremarkable  Behavior Characteristics Cooperative;Appropriate to situation  Mood Pleasant  Aggressive Behavior  Effect No apparent injury  Thought Process  Coherency WDL  Content WDL  Delusions None reported or observed  Perception WDL  Hallucination None reported or observed  Judgment Poor  Confusion None  Danger to Self  Current suicidal ideation? Denies  Description of Suicide Plan no plan  Self-Injurious Behavior No self-injurious ideation or behavior indicators observed or expressed   Agreement Not to Harm Self Yes  Description of Agreement verbal  Danger to Others  Danger to Others None reported or observed

## 2024-05-17 NOTE — Group Note (Signed)
 Date:  05/17/2024 Time:  9:29 PM  Group Topic/Focus:  Wrap-Up Group:   The focus of this group is to help patients review their daily goal of treatment and discuss progress on daily workbooks.    Participation Level:  Minimal  Participation Quality:  Appropriate and Attentive  Affect:  Appropriate  Cognitive:  Alert and Appropriate  Insight: Appropriate  Engagement in Group:  Limited  Modes of Intervention:  Discussion  Additional Comments:     Maglione,Koven Belinsky E 05/17/2024, 9:29 PM

## 2024-05-18 DIAGNOSIS — F200 Paranoid schizophrenia: Secondary | ICD-10-CM | POA: Diagnosis not present

## 2024-05-18 MED ORDER — AMLODIPINE BESYLATE 10 MG PO TABS: 10.0000 mg | ORAL_TABLET | Freq: Every day | ORAL | 0 refills | Status: DC

## 2024-05-18 MED ORDER — BENAZEPRIL HCL 10 MG PO TABS: 10.0000 mg | ORAL_TABLET | Freq: Every day | ORAL | 0 refills | Status: DC

## 2024-05-18 MED ORDER — NICOTINE 21 MG/24HR TD PT24: 21.0000 mg | MEDICATED_PATCH | Freq: Every day | TRANSDERMAL | 0 refills | Status: DC

## 2024-05-18 MED ORDER — RISPERIDONE 0.5 MG PO TABS: 1.5000 mg | ORAL_TABLET | Freq: Two times a day (BID) | ORAL | 0 refills | Status: DC

## 2024-05-18 MED ORDER — HYDROXYZINE HCL 50 MG PO TABS
50.0000 mg | ORAL_TABLET | Freq: Three times a day (TID) | ORAL | Status: DC | PRN
Start: 2024-05-18 — End: 2024-05-18

## 2024-05-18 MED ORDER — BENZTROPINE MESYLATE 1 MG PO TABS: 1.0000 mg | ORAL_TABLET | Freq: Two times a day (BID) | ORAL | 0 refills | Status: DC

## 2024-05-18 MED ORDER — TRAZODONE HCL 50 MG PO TABS: 50.0000 mg | ORAL_TABLET | Freq: Every evening | ORAL | 0 refills | Status: DC | PRN

## 2024-05-18 NOTE — Progress Notes (Signed)
 Patient denies SI/I/AVH at this time. Discharge instructions, AVS, prescriptions, and transition record reviewed with patient. Patient agrees to comply with medication management, follow-up visit and outpatient therapy. Patient belongings returned to patient. Patient questions and concerns addressed and answered.  Patient ambulatory off unit. Patient discharged home via taxi.

## 2024-05-18 NOTE — Plan of Care (Signed)
  Problem: Education: Goal: Knowledge of  General Education information/materials will improve 05/18/2024 1211 by Neill Leita POUR, RN Outcome: Adequate for Discharge 05/18/2024 1211 by Neill Leita POUR, RN Outcome: Adequate for Discharge Goal: Emotional status will improve 05/18/2024 1211 by Neill Leita POUR, RN Outcome: Adequate for Discharge 05/18/2024 1211 by Neill Leita POUR, RN Outcome: Adequate for Discharge Goal: Mental status will improve 05/18/2024 1211 by Neill Leita POUR, RN Outcome: Adequate for Discharge 05/18/2024 1211 by Neill Leita POUR, RN Outcome: Adequate for Discharge Goal: Verbalization of understanding the information provided will improve 05/18/2024 1211 by Neill Leita POUR, RN Outcome: Adequate for Discharge 05/18/2024 1211 by Neill Leita POUR, RN Outcome: Adequate for Discharge   Problem: Activity: Goal: Interest or engagement in activities will improve 05/18/2024 1211 by Neill Leita POUR, RN Outcome: Adequate for Discharge 05/18/2024 1211 by Neill Leita POUR, RN Outcome: Adequate for Discharge Goal: Sleeping patterns will improve 05/18/2024 1211 by Neill Leita POUR, RN Outcome: Adequate for Discharge 05/18/2024 1211 by Neill Leita POUR, RN Outcome: Adequate for Discharge   Problem: Coping: Goal: Ability to verbalize frustrations and anger appropriately will improve 05/18/2024 1211 by Neill Leita POUR, RN Outcome: Adequate for Discharge 05/18/2024 1211 by Neill Leita POUR, RN Outcome: Adequate for Discharge Goal: Ability to demonstrate self-control will improve 05/18/2024 1211 by Neill Leita POUR, RN Outcome: Adequate for Discharge 05/18/2024 1211 by Neill Leita POUR, RN Outcome: Adequate for Discharge   Problem: Health Behavior/Discharge Planning: Goal: Identification of resources available to assist in meeting health care needs will improve 05/18/2024 1211 by Neill Leita POUR, RN Outcome: Adequate for Discharge 05/18/2024 1211 by Neill Leita POUR, RN Outcome: Adequate for Discharge Goal:  Compliance with treatment plan for underlying cause of condition will improve 05/18/2024 1211 by Neill Leita POUR, RN Outcome: Adequate for Discharge 05/18/2024 1211 by Neill Leita POUR, RN Outcome: Adequate for Discharge   Problem: Physical Regulation: Goal: Ability to maintain clinical measurements within normal limits will improve 05/18/2024 1211 by Neill Leita POUR, RN Outcome: Adequate for Discharge 05/18/2024 1211 by Neill Leita POUR, RN Outcome: Adequate for Discharge   Problem: Safety: Goal: Periods of time without injury will increase 05/18/2024 1211 by Neill Leita POUR, RN Outcome: Adequate for Discharge 05/18/2024 1211 by Neill Leita POUR, RN Outcome: Adequate for Discharge

## 2024-05-18 NOTE — BHH Suicide Risk Assessment (Signed)
 [facility] Discharge Suicide Risk Assessment   Principal Problem: Paranoid schizophrenia (HCC) Discharge Diagnoses: Principal Problem:   Paranoid schizophrenia (HCC)   Total Time spent with patient: 1 hour  Musculoskeletal: Strength & Muscle Tone: within normal limits Gait & Station: normal Patient leans: N/A  Psychiatric Specialty Exam  Presentation  General Appearance:  Casual  Eye Contact: Fair  Speech: Clear and Coherent  Speech Volume: Normal  Handedness: Right   Mood and Affect  Mood: Euthymic  Duration of Depression Symptoms: Greater than two weeks  Affect: Congruent   Thought Process  Thought Processes: Coherent  Descriptions of Associations:Intact  Orientation:Full (Time, Place and Person)  Thought Content:Logical  History of Schizophrenia/Schizoaffective disorder:No  Duration of Psychotic Symptoms:Greater than six months  Hallucinations:Hallucinations: None  Ideas of Reference:None  Suicidal Thoughts:Suicidal Thoughts: No  Homicidal Thoughts:Homicidal Thoughts: No   Sensorium  Memory: Immediate Fair; Recent Fair  Judgment: Good  Insight: Fair   Art therapist  Concentration: Fair  Attention Span: Fair  Recall: [employer] of Knowledge: Fair  Language: Fair   Psychomotor Activity  Psychomotor Activity: Psychomotor Activity: Normal   Assets  Assets: Communication Skills; Desire for Improvement   Sleep  Sleep: Sleep: Fair  Estimated Sleeping Duration (Last 24 Hours): 7.00-8.25 hours  Physical Exam: Physical Exam Vitals and nursing note reviewed.  HENT:     Head: Atraumatic.  Eyes:     Extraocular Movements: Extraocular movements intact.  Pulmonary:     Effort: Pulmonary effort is normal.  Neurological:     Mental Status: He is alert and oriented to person, place, and time.    Review of Systems  Psychiatric/Behavioral:  Negative for depression, hallucinations, memory loss, substance abuse  and suicidal ideas. The patient is not nervous/anxious and does not have insomnia.    Blood pressure (!) 138/94, pulse (!) 105, temperature 98.1 F (36.7 C), resp. rate 20, height 5' 11 (1.803 m), weight 77.1 kg, SpO2 100%. Body mass index is 23.71 kg/m.  Mental Status Per Nursing Assessment::   On Admission:  NA  Demographic Factors:  Male  Loss Factors: NA  Historical Factors: Impulsivity  Risk Reduction Factors:   Positive social support and Positive coping skills or problem solving skills  Continued Clinical Symptoms:  Depression 0/10 Anxiety 0/10 at discharge  Cognitive Features That Contribute To Risk:  None    Suicide Risk:  Minimal: No identifiable suicidal ideation.  Patients presenting with no risk factors but with morbid ruminations; may be classified as minimal risk based on the severity of the depressive symptoms   Follow-up Information     [facility] Follow up.   Why: Appointment is scheduled for 7/10 at 8:00AM Contact information: [address]  [address] [place] [address] [phone]                 Plan Of Care/Follow-up recommendations:  # It is recommended to the patient to continue psychiatric medications as prescribed, after discharge from the hospital.   # It is recommended to the patient to follow up with your outpatient psychiatric provider and PCP. # It was discussed with the patient, the impact of alcohol, drugs, tobacco have been there overall psychiatric and medical wellbeing, and total abstinence from substance use was recommended. # Prescriptions provided or sent directly to preferred pharmacy at discharge. Patient agreeable to plan. Given the opportunity to ask questions. Appears to feel comfortable with discharge.  # In the event of worsening symptoms, the patient is instructed to call the  crisis hotline (988), 911 and or go to the nearest ED for appropriate evaluation and treatment of symptoms. To follow-up with primary  care provider for other medical issues, concerns and or health care needs # Patient was discharged home as requested with a plan to follow up as noted above.    Donnice FORBES Right, PA-C 05/18/2024, 11:56 AM

## 2024-05-18 NOTE — Group Note (Signed)
 Recreation Therapy Group Note   Group Topic:General Recreation  Group Date: 05/18/2024 Start Time: 1030 End Time: 1130 Facilitators: Celestia Jeoffrey BRAVO, LRT, CTRS Location: Courtyard  Group Description: [employer]. LRT and patients played games of basketball, drew with chalk, and played corn hole while outside in the courtyard while getting fresh air and sunlight. Music was being played in the background. LRT and peers conversed about different games they have played before, what they do in their free time and anything else that is on their minds. LRT encouraged pts to drink water after being outside, sweating and getting their heart rate up.  Goal Area(s) Addressed: Patient will build on frustration tolerance skills. Patients will partake in a competitive play game with peers. Patients will gain knowledge of new leisure interest/hobby.    Affect/Mood: Appropriate   Participation Level: Active   Participation Quality: Independent   Behavior: Appropriate   Speech/Thought Process: Coherent   Insight: Good   Judgement: Good   Modes of Intervention: Activity   Patient Response to Interventions:  Receptive   Education Outcome:  Acknowledges education   Clinical Observations/Individualized Feedback: Damon [name] was active in their participation of session activities and group discussion. Pt interacted well with LRT and peers duration of session.    Plan: Continue to engage patient in RT group sessions 2-3x/week.   Jeoffrey BRAVO Celestia, LRT, CTRS 05/18/2024 11:40 AM

## 2024-05-18 NOTE — Plan of Care (Signed)
   Problem: Education: Goal: Emotional status will improve Outcome: Progressing Goal: Mental status will improve Outcome: Progressing

## 2024-05-18 NOTE — Discharge Summary (Signed)
 Physician Discharge Summary Note  Patient:  Damon [name] is an 58 y.o., male MRN:  [id number] DOB:  14-Apr-1966 Patient phone:  [phone] (home)  Patient address:   [address] [place] [address],    Date of Admission:  05/11/2024 Date of Discharge: 05/18/24  Reason for Admission:  The patient is a 58 year old male with a psychiatric history of paranoid schizophrenia and major depressive disorder, severe with psychotic features, and a medical history of essential hypertension, who presented voluntarily to [facility] reporting auditory hallucinations, sadness, and paranoia. He expressed concern related to loss of custody of his 59 year old son, which he identifies as a significant emotional stressor.   Principal Problem: Paranoid schizophrenia (HCC) Discharge Diagnoses: Principal Problem:   Paranoid schizophrenia (HCC)   Psychiatric History Paranoid schizophrenia (primary diagnosis) Major depressive disorder with psychotic features (no current symptoms) Past suicide attempt: 4 years ago, attempted to stab self  Prior medication trials: Seroquel, Trazodone  No consistent outpatient mental health engagement documented  Social History:  Social History   Substance and Sexual Activity  Alcohol Use Yes   Alcohol/week: 1.0 standard drink of alcohol   Types: 1 Cans of beer per week   Comment: one can of beer weekly     Social History   Substance and Sexual Activity  Drug Use Not Currently   Types: Marijuana    Social History   Socioeconomic History   Marital status: Single    Spouse name: Not on file   Number of children: Not on file   Years of education: Not on file   Highest education level: Not on file  Occupational History   Not on file  Tobacco Use   Smoking status: Every Day    Types: Cigarettes   Smokeless tobacco: Never  Substance and Sexual Activity   Alcohol use: Yes    Alcohol/week: 1.0 standard drink of alcohol     Types: 1 Cans of beer per week    Comment: one can of beer weekly   Drug use: Not Currently    Types: Marijuana   Sexual activity: Yes    Birth control/protection: Condom  Other Topics Concern   Not on file  Social History Narrative   ** Merged History Encounter **       Social Drivers of Health   Financial Resource Strain: Low Risk  (01/13/2023)   Overall Financial Resource Strain (CARDIA)    Difficulty of Paying Living Expenses: Not hard at all  Food Insecurity: No Food Insecurity (05/11/2024)   Hunger Vital Sign    Worried About Running Out of Food in the Last Year: Never true    Ran Out of Food in the Last Year: Never true  Transportation Needs: No Transportation Needs (05/11/2024)   PRAPARE - Administrator, Civil Service (Medical): No    Lack of Transportation (Non-Medical): No  Physical Activity: Sufficiently Active (01/13/2023)   Exercise Vital Sign    Days of Exercise per Week: 5 days    Minutes of Exercise per Session: 60 min  Stress: Stress Concern Present (01/13/2023)   [employer] of Occupational Health - Occupational Stress Questionnaire    Feeling of Stress : To some extent  Social Connections: Socially Isolated (05/11/2024)   Social Connection and Isolation Panel    Frequency of Communication with Friends and Family: Twice a week    Frequency of Social Gatherings with Friends and Family: Once a week    Attends Religious  Services: Never    Database administrator or Organizations: No    Attends Engineer, structural: Never    Marital Status: Never married   Past Medical History:  Past Medical History:  Diagnosis Date   Depression    Hypertension    Paranoid schizophrenia (HCC)    Schizophrenia (HCC)    History reviewed. No pertinent surgical history. Family History:  Family History  Problem Relation Age of Onset   Colon cancer Mother    Hyperlipidemia Father    Heart failure Brother    Diabetes Other    Stroke Other      Hospital Course:    The patient is a 58 year old male with a psychiatric history of paranoid schizophrenia and major depressive disorder with psychotic features who was admitted voluntarily following a period of decompensation in the context of psychosocial stressors and medication nonadherence. On admission, he endorsed auditory hallucinations, paranoia, poor sleep and appetite, and recent suicidal ideation. He reported stress related to the loss of custody of his son, recent termination of Medicaid benefits, and pending paternity testing.  Upon admission, the patient was restarted on Risperidone  1.5 mg twice daily, along with Cogentin  1 mg BID for EPS prophylaxis and Trazodone  50 mg nightly for sleep. Early in the admission, he continued to report auditory hallucinations and anxiety but showed improved sleep and engagement with group programming. He acknowledged that stress and lack of access to medications contributed to his psychiatric symptoms.  By 6/29, the patient denied further hallucinations and began participating actively in group sessions. He reported that Risperidone  was effective and was tolerating it well without notable side effects. Over the following days, he demonstrated progressive clinical improvement with resolution of hallucinations, improved insight, stable mood, and no evidence of internal preoccupation or disorganized thought. He consistently denied suicidal or homicidal ideation, mania, paranoia, or delusional thinking. He remained medication compliant and reported improved self-esteem and coping skills through group participation.  By 7/3, the patient was noted to be pleasant, future-oriented, and without evidence of acute psychosis or mood disturbance. He expressed readiness for discharge, voiced understanding of the importance of medication adherence, and demonstrated insight into the need for outpatient follow-up. Social work completed collateral safety checks, with no  safety concerns noted. The patient was discharged in improved and psychiatrically stable condition.  Detailed risk assessment is complete based on clinical exam and individual risk factors and acute suicide risk is low and acute violence risk is low.     Currently, all modifiable risk of harm to self/harm to others have been addressed and patient is no longer appropriate for the acute inpatient setting and is able to continue treatment for mental health needs in the community with the supports as indicated below.  Patient is educated and verbalized understanding of discharge plan of care including medications, follow-up appointments, mental health resources and further crisis services in the community.  He is instructed to call 911 or present to the nearest emergency room should he experience any decompensation in mood, disturbance of bowel or return of suicidal/homicidal ideations.  Patient verbalizes understanding of this education and agrees to this plan of care  Physical Findings: AIMS:  , ,  ,  ,    CIWA:    COWS:        Psychiatric Specialty Exam:  Presentation  General Appearance:  Casual  Eye Contact: Fair  Speech: Clear and Coherent  Speech Volume: Normal    Mood and Affect  Mood: Euthymic  Affect: Congruent   Thought Process  Thought Processes: Coherent  Descriptions of Associations:Intact  Orientation:Full (Time, Place and Person)  Thought Content:Logical  Hallucinations:Hallucinations: None  Ideas of Reference:None  Suicidal Thoughts:Suicidal Thoughts: No  Homicidal Thoughts:Homicidal Thoughts: No   Sensorium  Memory: Immediate Fair; Recent Fair  Judgment: Good  Insight: Fair   Art therapist  Concentration: Fair  Attention Span: Fair  Recall: Fair  Fund of Knowledge: Fair  Language: Fair   Psychomotor Activity  Psychomotor Activity: Psychomotor Activity: Normal  Musculoskeletal: Strength & Muscle Tone: within  normal limits Gait & Station: normal Assets  Assets: Manufacturing systems engineer; Desire for Improvement   Sleep  Sleep: Sleep: Fair    Physical Exam: Physical Exam Vitals and nursing note reviewed.  HENT:     Head: Atraumatic.  Eyes:     Extraocular Movements: Extraocular movements intact.  Pulmonary:     Effort: Pulmonary effort is normal.  Neurological:     Mental Status: He is alert and oriented to person, place, and time.    Review of Systems  Psychiatric/Behavioral:  Negative for depression, hallucinations, substance abuse and suicidal ideas. The patient is not nervous/anxious and does not have insomnia.    Blood pressure (!) 138/94, pulse (!) 105, temperature 98.1 F (36.7 C), resp. rate 20, height 5' 11 (1.803 m), weight 77.1 kg, SpO2 100%. Body mass index is 23.71 kg/m.   Social History   Tobacco Use  Smoking Status Every Day   Types: Cigarettes  Smokeless Tobacco Never   Tobacco Cessation:  A prescription for an FDA-approved tobacco cessation medication provided at discharge   Blood Alcohol level:  Lab Results  Component Value Date   ETH <15 05/10/2024   ETH 49 (H) 01/28/2024    Metabolic Disorder Labs:  Lab Results  Component Value Date   HGBA1C 5.5 05/10/2024   MPG 111.15 05/10/2024   MPG 116.89 09/11/2023   Lab Results  Component Value Date   PROLACTIN 4.6 05/10/2024   PROLACTIN 55.0 (H) 10/13/2017   Lab Results  Component Value Date   CHOL 221 (H) 05/10/2024   TRIG 86 05/10/2024   HDL 97 05/10/2024   CHOLHDL 2.3 05/10/2024   VLDL 17 05/10/2024   LDLCALC 107 (H) 05/10/2024   LDLCALC 59 09/11/2023    See Psychiatric Specialty Exam and Suicide Risk Assessment completed by Attending Physician prior to discharge.  Discharge destination:  Home  Is patient on multiple antipsychotic therapies at discharge:  No   Has Patient had three or more failed trials of antipsychotic monotherapy by history:  No  Recommended Plan for Multiple  Antipsychotic Therapies: NA  Discharge Instructions     Increase activity slowly   Complete by: As directed       Allergies as of 05/18/2024       Reactions   Spinach Hives   Zofran  [ondansetron ] Rash        Medication List     STOP taking these medications    diphenhydrAMINE  25 mg capsule Commonly known as: BENADRYL        TAKE these medications      Indication  amLODipine  10 MG tablet Commonly known as: NORVASC  Take 1 tablet (10 mg total) by mouth daily.  Indication: High Blood Pressure   benazepril  10 MG tablet Commonly known as: LOTENSIN  Take 1 tablet (10 mg total) by mouth daily.  Indication: High Blood Pressure   benztropine  1 MG tablet Commonly known as: COGENTIN  Take 1 tablet (1 mg total) by mouth  2 (two) times daily.  Indication: Extrapyramidal Reaction caused by Medications   nicotine  21 mg/24hr patch Commonly known as: NICODERM CQ  - dosed in mg/24 hours Place 1 patch (21 mg total) onto the skin daily. Start taking on: May 19, 2024  Indication: Nicotine  Addiction   risperiDONE  0.5 MG tablet Commonly known as: RISPERDAL  Take 3 tablets (1.5 mg total) by mouth 2 (two) times daily. What changed:  medication strength how much to take  Indication: Schizophrenia   traZODone  50 MG tablet Commonly known as: DESYREL  Take 1 tablet (50 mg total) by mouth at bedtime as needed for sleep. What changed:  when to take this reasons to take this  Indication: Trouble Sleeping        Follow-up Information     [facility] Follow up.   Why: Appointment is scheduled for 7/10 at 8:00AM Contact information: [address]  [address] [place] [address] [phone]                 Follow-up recommendations:   # It is recommended to the patient to continue psychiatric medications as prescribed, after discharge from the hospital.   # It is recommended to the patient to follow up with your outpatient psychiatric provider and PCP. # It was  discussed with the patient, the impact of alcohol, drugs, tobacco have been there overall psychiatric and medical wellbeing, and total abstinence from substance use was recommended. # Prescriptions provided or sent directly to preferred pharmacy at discharge. Patient agreeable to plan. Given the opportunity to ask questions. Appears to feel comfortable with discharge.  # In the event of worsening symptoms, the patient is instructed to call the crisis hotline (988), 911 and or go to the nearest ED for appropriate evaluation and treatment of symptoms. To follow-up with primary care provider for other medical issues, concerns and or health care needs # Patient was discharged home as requested with a plan to follow up as noted above.      Signed: Donnice FORBES Right, PA-C 05/18/2024, 11:58 AM

## 2024-05-18 NOTE — Progress Notes (Signed)
   05/18/24 0900  Psych Admission Type (Psych Patients Only)  Admission Status Voluntary  Psychosocial Assessment  Patient Complaints None  Eye Contact Brief  Facial Expression Animated  Affect Appropriate to circumstance  Speech Logical/coherent  Interaction Guarded;Minimal  Motor Activity Other (Comment) (appropriate for developmental age)  Appearance/Hygiene Unremarkable  Behavior Characteristics Cooperative  Mood Pleasant  Thought Process  Coherency WDL  Content WDL  Delusions None reported or observed  Perception WDL  Hallucination None reported or observed  Judgment WDL  Confusion None  Danger to Self  Current suicidal ideation? Denies  Self-Injurious Behavior No self-injurious ideation or behavior indicators observed or expressed   Danger to Others  Danger to Others None reported or observed

## 2024-05-18 NOTE — Progress Notes (Signed)
  [facility] Adult Case Management Discharge Plan :  Will you be returning to the same living situation after discharge:  Yes,  Patient to return home.  At discharge, do you have transportation home?: Yes,  CSW to arrange taxi services on patient's behalf.  Do you have the ability to pay for your medications: Yes,  [employer]ICARE [employer]VANTAGE / [facility] MEDICARE ADVANTAGE  Release of information consent forms completed and in the chart;  Patient's signature needed at discharge.  Patient to Follow up at:  Follow-up Information     [facility] Follow up.   Why: Appointment is scheduled for 7/10 at 8:00AM Contact information: [address]  [address] [place] [address] [phone]                 Next level of care provider has access to [facility]:no  Safety Planning and Suicide Prevention discussed: Yes,  Education Completed; Delmar Arriaga, partner, [phone],  (name of family member/significant other) has been identified by the patient as the family member/significant other with whom the patient will be residing, and identified as the person(s) who will aid the patient in the event of a mental health crisis (suicidal ideations/suicide attempt).  With written consent from the patient, the family member/significant other has been provided the following suicide prevention education, prior to the and/or following the discharge of the patient.     Has patient been referred to the Quitline?: Patient refused referral for treatment  Patient has been referred for addiction treatment: No known substance use disorder.  Alveta CHRISTELLA Kerns, LCSW 05/18/2024, 8:50 AM

## 2024-05-18 NOTE — Care Management Important Message (Signed)
 Important Message  Patient Details  Name: Damon [name] MRN: [id number] Date of Birth: Dec 19, 1965   Medicare Important Message Given:  Yes - Medicare IM     Anja Neuzil M Tyge Somers, LCSW 05/18/2024, 8:54 AM

## 2024-07-10 ENCOUNTER — Encounter (HOSPITAL_COMMUNITY): Payer: Self-pay | Admitting: Family Medicine

## 2024-07-10 ENCOUNTER — Inpatient Hospital Stay (HOSPITAL_COMMUNITY)
Admission: AD | Admit: 2024-07-10 | Discharge: 2024-07-13 | DRG: 885 | Disposition: A | Discharge status: Home / Self Care | Source: Intra-hospital | Attending: Psychiatry | Admitting: Psychiatry

## 2024-07-10 ENCOUNTER — Ambulatory Visit (HOSPITAL_COMMUNITY)
Admission: EM | Admit: 2024-07-10 | Discharge: 2024-07-10 | Disposition: A | Discharge status: Other Acute Inpatient Hospital | Attending: Family Medicine | Admitting: Family Medicine

## 2024-07-10 DIAGNOSIS — Z823 Family history of stroke: Secondary | ICD-10-CM

## 2024-07-10 DIAGNOSIS — F200 Paranoid schizophrenia: Secondary | ICD-10-CM | POA: Diagnosis present

## 2024-07-10 DIAGNOSIS — I10 Essential (primary) hypertension: Secondary | ICD-10-CM | POA: Diagnosis present

## 2024-07-10 DIAGNOSIS — Z833 Family history of diabetes mellitus: Secondary | ICD-10-CM

## 2024-07-10 DIAGNOSIS — G4700 Insomnia, unspecified: Secondary | ICD-10-CM | POA: Diagnosis present

## 2024-07-10 DIAGNOSIS — Z888 Allergy status to other drugs, medicaments and biological substances status: Secondary | ICD-10-CM

## 2024-07-10 DIAGNOSIS — Z79899 Other long term (current) drug therapy: Secondary | ICD-10-CM

## 2024-07-10 DIAGNOSIS — Z9151 Personal history of suicidal behavior: Secondary | ICD-10-CM | POA: Diagnosis not present

## 2024-07-10 DIAGNOSIS — R45851 Suicidal ideations: Secondary | ICD-10-CM | POA: Insufficient documentation

## 2024-07-10 DIAGNOSIS — Z818 Family history of other mental and behavioral disorders: Secondary | ICD-10-CM | POA: Diagnosis not present

## 2024-07-10 DIAGNOSIS — F17210 Nicotine dependence, cigarettes, uncomplicated: Secondary | ICD-10-CM | POA: Diagnosis present

## 2024-07-10 DIAGNOSIS — F333 Major depressive disorder, recurrent, severe with psychotic symptoms: Secondary | ICD-10-CM | POA: Diagnosis present

## 2024-07-10 DIAGNOSIS — F332 Major depressive disorder, recurrent severe without psychotic features: Principal | ICD-10-CM | POA: Diagnosis present

## 2024-07-10 DIAGNOSIS — Z5941 Food insecurity: Secondary | ICD-10-CM | POA: Diagnosis not present

## 2024-07-10 DIAGNOSIS — Z8659 Personal history of other mental and behavioral disorders: Secondary | ICD-10-CM

## 2024-07-10 DIAGNOSIS — Z8249 Family history of ischemic heart disease and other diseases of the circulatory system: Secondary | ICD-10-CM | POA: Diagnosis not present

## 2024-07-10 DIAGNOSIS — Z83438 Family history of other disorder of lipoprotein metabolism and other lipidemia: Secondary | ICD-10-CM

## 2024-07-10 DIAGNOSIS — Z91148 Patient's other noncompliance with medication regimen for other reason: Secondary | ICD-10-CM | POA: Insufficient documentation

## 2024-07-10 LAB — CBC WITH DIFFERENTIAL/PLATELET
Abs Immature Granulocytes: 0.03 K/uL (ref 0.00–0.07)
Basophils Absolute: 0 K/uL (ref 0.0–0.1)
Basophils Relative: 1 %
Eosinophils Absolute: 0.1 K/uL (ref 0.0–0.5)
Eosinophils Relative: 1 %
HCT: 39.4 % (ref 39.0–52.0)
Hemoglobin: 13.4 g/dL (ref 13.0–17.0)
Immature Granulocytes: 1 %
Lymphocytes Relative: 34 %
Lymphs Abs: 1.9 K/uL (ref 0.7–4.0)
MCH: 30.9 pg (ref 26.0–34.0)
MCHC: 34 g/dL (ref 30.0–36.0)
MCV: 90.8 fL (ref 80.0–100.0)
Monocytes Absolute: 0.5 K/uL (ref 0.1–1.0)
Monocytes Relative: 10 %
Neutro Abs: 3 K/uL (ref 1.7–7.7)
Neutrophils Relative %: 53 %
Platelets: 300 K/uL (ref 150–400)
RBC: 4.34 MIL/uL (ref 4.22–5.81)
RDW: 12.7 % (ref 11.5–15.5)
WBC: 5.6 K/uL (ref 4.0–10.5)
nRBC: 0 % (ref 0.0–0.2)

## 2024-07-10 LAB — COMPREHENSIVE METABOLIC PANEL WITH GFR
ALT: 25 U/L (ref 0–44)
AST: 29 U/L (ref 15–41)
Albumin: 4 g/dL (ref 3.5–5.0)
Alkaline Phosphatase: 42 U/L (ref 38–126)
Anion gap: 13 (ref 5–15)
BUN: 8 mg/dL (ref 6–20)
CO2: 21 mmol/L — ABNORMAL LOW (ref 22–32)
Calcium: 9.5 mg/dL (ref 8.9–10.3)
Chloride: 102 mmol/L (ref 98–111)
Creatinine, Ser: 1.26 mg/dL — ABNORMAL HIGH (ref 0.61–1.24)
GFR, Estimated: >60 mL/min (ref >60)
Glucose, Bld: 101 mg/dL — ABNORMAL HIGH (ref 70–99)
Potassium: 3.5 mmol/L (ref 3.5–5.1)
Sodium: 136 mmol/L (ref 135–145)
Total Bilirubin: 1 mg/dL (ref 0.0–1.2)
Total Protein: 7.3 g/dL (ref 6.5–8.1)

## 2024-07-10 LAB — POCT URINE DRUG SCREEN - MANUAL ENTRY (I-SCREEN)
POC Amphetamine UR: NOT DETECTED
POC Buprenorphine (BUP): NOT DETECTED
POC Cocaine UR: NOT DETECTED
POC Marijuana UR: NOT DETECTED
POC Methadone UR: NOT DETECTED
POC Methamphetamine UR: NOT DETECTED
POC Morphine: NOT DETECTED
POC Oxazepam (BZO): NOT DETECTED
POC Oxycodone UR: NOT DETECTED
POC Secobarbital (BAR): NOT DETECTED

## 2024-07-10 LAB — TSH: TSH: 0.916 u[IU]/mL (ref 0.350–4.500)

## 2024-07-10 LAB — LIPID PANEL
Cholesterol: 233 mg/dL — ABNORMAL HIGH (ref 0–200)
HDL: 101 mg/dL (ref >40)
LDL Cholesterol: 102 mg/dL — ABNORMAL HIGH (ref 0–99)
Total CHOL/HDL Ratio: 2.3 ratio
Triglycerides: 151 mg/dL — ABNORMAL HIGH (ref <150)
VLDL: 30 mg/dL (ref 0–40)

## 2024-07-10 LAB — HEMOGLOBIN A1C
Hgb A1c MFr Bld: 5.6 % (ref 4.8–5.6)
Mean Plasma Glucose: 114.02 mg/dL

## 2024-07-10 LAB — ETHANOL: Alcohol, Ethyl (B): <15 mg/dL (ref <15)

## 2024-07-10 MED ORDER — LORAZEPAM 2 MG/ML IJ SOLN: 2.0000 mg | Freq: Three times a day (TID) | INTRAMUSCULAR | Status: DC | PRN

## 2024-07-10 MED ORDER — DIPHENHYDRAMINE HCL 50 MG/ML IJ SOLN: 50.0000 mg | Freq: Three times a day (TID) | INTRAMUSCULAR | Status: DC | PRN

## 2024-07-10 MED ORDER — BENAZEPRIL HCL 5 MG PO TABS
10.0000 mg | ORAL_TABLET | Freq: Every day | ORAL | Status: DC
  Administered 2024-07-10: 10 mg via ORAL
  Filled 2024-07-10: qty 2

## 2024-07-10 MED ORDER — TRAZODONE HCL 50 MG PO TABS: 50.0000 mg | ORAL_TABLET | Freq: Every day | ORAL | Status: DC

## 2024-07-10 MED ORDER — HALOPERIDOL LACTATE 5 MG/ML IJ SOLN: 5.0000 mg | Freq: Three times a day (TID) | INTRAMUSCULAR | Status: DC | PRN

## 2024-07-10 MED ORDER — BENZTROPINE MESYLATE 1 MG PO TABS
1.0000 mg | ORAL_TABLET | Freq: Two times a day (BID) | ORAL | Status: DC
  Administered 2024-07-10 – 2024-07-13 (×6): 1 mg via ORAL
  Filled 2024-07-10 (×6): qty 1

## 2024-07-10 MED ORDER — HYDROXYZINE HCL 25 MG PO TABS
25.0000 mg | ORAL_TABLET | Freq: Three times a day (TID) | ORAL | Status: DC | PRN
  Administered 2024-07-10: 25 mg via ORAL
  Filled 2024-07-10: qty 1

## 2024-07-10 MED ORDER — AMLODIPINE BESYLATE 10 MG PO TABS
10.0000 mg | ORAL_TABLET | Freq: Every day | ORAL | Status: DC
  Administered 2024-07-10 – 2024-07-13 (×4): 10 mg via ORAL
  Filled 2024-07-10 (×4): qty 1

## 2024-07-10 MED ORDER — ALUM & MAG HYDROXIDE-SIMETH 200-200-20 MG/5ML PO SUSP: 30.0000 mL | ORAL | Status: DC | PRN

## 2024-07-10 MED ORDER — DIPHENHYDRAMINE HCL 50 MG PO CAPS: 50.0000 mg | ORAL_CAPSULE | Freq: Three times a day (TID) | ORAL | Status: DC | PRN

## 2024-07-10 MED ORDER — HALOPERIDOL LACTATE 5 MG/ML IJ SOLN: 10.0000 mg | Freq: Three times a day (TID) | INTRAMUSCULAR | Status: DC | PRN

## 2024-07-10 MED ORDER — RISPERIDONE 1 MG PO TBDP
1.5000 mg | ORAL_TABLET | Freq: Two times a day (BID) | ORAL | Status: AC
  Administered 2024-07-10 – 2024-07-13 (×6): 1.5 mg via ORAL
  Filled 2024-07-10 (×6): qty 1

## 2024-07-10 MED ORDER — LORAZEPAM 2 MG/ML IJ SOLN
2.0000 mg | Freq: Three times a day (TID) | INTRAMUSCULAR | Status: DC | PRN
Start: 2024-07-10 — End: 2024-07-10

## 2024-07-10 MED ORDER — NICOTINE 21 MG/24HR TD PT24
21.0000 mg | MEDICATED_PATCH | Freq: Every day | TRANSDERMAL | Status: DC | PRN
  Administered 2024-07-11 – 2024-07-12 (×2): 21 mg via TRANSDERMAL
  Filled 2024-07-10 (×2): qty 1

## 2024-07-10 MED ORDER — BENAZEPRIL HCL 5 MG PO TABS
10.0000 mg | ORAL_TABLET | Freq: Every day | ORAL | Status: DC
  Administered 2024-07-11 – 2024-07-13 (×3): 10 mg via ORAL
  Filled 2024-07-10 (×3): qty 2

## 2024-07-10 MED ORDER — NICOTINE 21 MG/24HR TD PT24
21.0000 mg | MEDICATED_PATCH | Freq: Every day | TRANSDERMAL | Status: DC | PRN
  Administered 2024-07-10: 21 mg via TRANSDERMAL
  Filled 2024-07-10: qty 1

## 2024-07-10 MED ORDER — TRAZODONE HCL 50 MG PO TABS
50.0000 mg | ORAL_TABLET | Freq: Every day | ORAL | Status: DC
  Administered 2024-07-10 – 2024-07-12 (×3): 50 mg via ORAL
  Filled 2024-07-10 (×3): qty 1

## 2024-07-10 MED ORDER — BENZTROPINE MESYLATE 1 MG PO TABS
1.0000 mg | ORAL_TABLET | Freq: Two times a day (BID) | ORAL | Status: DC
  Administered 2024-07-10: 1 mg via ORAL
  Filled 2024-07-10: qty 1

## 2024-07-10 MED ORDER — ACETAMINOPHEN 325 MG PO TABS: 650.0000 mg | ORAL_TABLET | Freq: Four times a day (QID) | ORAL | Status: DC | PRN

## 2024-07-10 MED ORDER — AMLODIPINE BESYLATE 10 MG PO TABS: 10.0000 mg | ORAL_TABLET | Freq: Every day | ORAL | Status: DC

## 2024-07-10 MED ORDER — ACETAMINOPHEN 325 MG PO TABS
650.0000 mg | ORAL_TABLET | Freq: Four times a day (QID) | ORAL | Status: DC | PRN
Start: 2024-07-10 — End: 2024-07-13
  Administered 2024-07-10 – 2024-07-13 (×3): 650 mg via ORAL
  Filled 2024-07-10 (×3): qty 2

## 2024-07-10 MED ORDER — MAGNESIUM HYDROXIDE 400 MG/5ML PO SUSP: 30.0000 mL | Freq: Every day | ORAL | Status: DC | PRN

## 2024-07-10 MED ORDER — DIPHENHYDRAMINE HCL 50 MG/ML IJ SOLN
50.0000 mg | Freq: Three times a day (TID) | INTRAMUSCULAR | Status: DC | PRN
Start: 2024-07-10 — End: 2024-07-10

## 2024-07-10 MED ORDER — HALOPERIDOL 5 MG PO TABS: 5.0000 mg | ORAL_TABLET | Freq: Three times a day (TID) | ORAL | Status: DC | PRN

## 2024-07-10 MED ORDER — RISPERIDONE 1 MG PO TBDP
1.5000 mg | ORAL_TABLET | Freq: Two times a day (BID) | ORAL | Status: DC
  Administered 2024-07-10: 1.5 mg via ORAL
  Filled 2024-07-10: qty 1

## 2024-07-10 MED ORDER — DIPHENHYDRAMINE HCL 25 MG PO CAPS: 50.0000 mg | ORAL_CAPSULE | Freq: Three times a day (TID) | ORAL | Status: DC | PRN

## 2024-07-10 MED ORDER — AMLODIPINE BESYLATE 10 MG PO TABS
10.0000 mg | ORAL_TABLET | Freq: Every day | ORAL | Status: DC
  Administered 2024-07-10: 10 mg via ORAL
  Filled 2024-07-10: qty 1

## 2024-07-10 MED ORDER — ALUM & MAG HYDROXIDE-SIMETH 200-200-20 MG/5ML PO SUSP
30.0000 mL | ORAL | Status: DC | PRN
Start: 2024-07-10 — End: 2024-07-13

## 2024-07-10 NOTE — ED Provider Notes (Signed)
 Behavioral Health Urgent Care Medical Screening Exam  Patient Name: Damon [name] MRN: [id number] Date of Evaluation: 07/10/24 Chief Complaint:  I've hit rock bottom Diagnosis:  Final diagnoses:  Severe episode of recurrent major depressive disorder, with psychotic features (HCC)  Hx of paranoid schizophrenia   Damon [name], 58 y.o., male patient seen face to face by this provider, consulted with Dr. Cole; and chart reviewed on 07/10/24.   History of Present illness: Damon [name] is a 58 y.o. male, history of paranoid schizophrenia, chronic auditory hallucinations, noncompliance to medication regimen, and MDD, history of suicidal attempt presents today with worsening suicidal thoughts and severe depression stemming from recently learning that he is not the biological father of the 66 year old child he has been coparenting.  Patient is here voluntarily and presents unaccompanied today.  Per triage documentation:  PT Damon [name] 75Y male presents to [facility] unaccompanied, voluntarily. PT states he found out his 21y old son is not his, last week. PT states his mood has been getting worse and worse. PT stated he needs to get himself together because he cannot think straight at the moment. PT states he suffers from paranoia, schizophrenia and depression. PT endorses SI with plan and AVH. PT denies HI and alcohol / substance abuse. When asked if pt would try to end his life if he left [facility] today, he stated yes, I would.   Patient seen by this Clinical research associate and Damon [name], counselor-[facility] completed see chart review  Patient endorses that he has had a plan to kill himself by stabbing himself.  He reports that 4 years ago he became suicidal and had a suicide attempt due to stabbing himself in the chest.  Patient endorses that he hears voices however most of the time he is unable to discern what the voices are stating however he feels that the voices are telling him to harm himself.  Patient  has been admitted twice this year to inpatient behavioral health and was last discharged around May 19, 2024.  He reports he felt stable at discharge however according to the information he is providing patient has not had any outpatient follow-up with a mental health provider.  Patient was at one point established to be seen at the outpatient clinic but it was determined that he had Medicaid and [facility]  He reports over the last month his insurance has changed to [facility] and now he is not eligible to be seen at [facility] due to his insurance and will need outpatient follow-up and prefers to be seen at a [facility] health facility.  Patient endorses that he is prescribed and takes risperidone  1.5 mg twice daily, trazodone  50 mg at bedtime for sleep, benztropine  1 mg twice daily, and 2 blood pressure medications which on chart review her amlodipine  10 mg and benzoyl 10 mg.  Patient is mildly hypertensive on arrival however is asymptomatic.  He reports he has not taken any medications within the last 48 hours.  Patient endorses that he drinks alcohol 3 out of 7 days/week.  Denies any recent substance use.  Patient endorses that he smokes cigarettes daily.  Patient denies any homicidal ideations.  Patient is unable to contract for safety and feels he will harm himself if discharged from this facility.  Patient is verbalizing a willingness to be admitted to inpatient psychiatric treatment hospital for mental health stabilization.  During evaluation Damon [name] is in no acute distress.  He is alert, oriented  x 4, calm, cooperative and attentive.  His mood is euthymic with congruent affect.  He has normal speech, and behavior.  Objectively there is no evidence of psychosis/mania or delusional thinking.  Patient is able to converse coherently, goal directed thoughts, no distractibility, or pre-occupation.  He endorses suicidal ideations with a plan to stab himself but denies any self-harming  behaviors or thoughts of homicide.  Endorses suicidal thoughts become more active and intrusive when alone.  Patient does not appear to be having any paranoia or delusional thoughts.  Given patient's history and active suicidality patient is at high risk for suicidal behavior and needs inpatient psychiatric treatment criteria.  Patient is willing to be admitted voluntarily. Flowsheet Row ED from 07/10/2024 in [facility] Admission (Discharged) from 05/11/2024 in [facility] INPATIENT BEHAVIORAL MEDICINE ED from 05/10/2024 in [facility]  C-SSRS RISK CATEGORY High Risk Error: Q7 should not be populated when Q6 is No Moderate Risk    Psychiatric Specialty Exam  Presentation  General Appearance:Appropriate for Environment  Eye Contact:Fair  Speech:Clear and Coherent  Speech Volume:Normal  Handedness:Right   Mood and Affect  Mood: Euthymic  Affect: Appropriate   Thought Process  Thought Processes: Goal Directed  Descriptions of Associations:Circumstantial  Orientation:Full (Time, Place and Person)  Thought Content:WDL  Diagnosis of Schizophrenia or Schizoaffective disorder in past: No  Duration of Psychotic Symptoms: Greater than six months  Hallucinations:Auditory I hear loud voices, can't understand but sometimes tells me to hurt myself But they appear blurry  Ideas of Reference:None  Suicidal Thoughts:Yes, Active With Plan  Homicidal Thoughts:No   Sensorium  Memory: Immediate Good; Recent Fair; Remote Good  Judgment: Fair  Insight: Fair   Chartered certified accountant: Fair  Attention Span: Fair  Recall: [employer] of Knowledge: Fair  Language: Fair   Psychomotor Activity  Psychomotor Activity: Normal   Assets  Assets: Manufacturing systems engineer; Desire for Improvement; Financial Resources/Insurance; Housing; Physical Health; Social Support   Sleep  Sleep: Good  Number of  hours:  -- (Good sleep with medication)   Physical Exam: Physical Exam Constitutional:      General: He is not in acute distress.    Appearance: Normal appearance. He is not ill-appearing.  HENT:     Head: Normocephalic and atraumatic.     Nose: Nose normal.  Eyes:     Extraocular Movements: Extraocular movements intact.     Conjunctiva/sclera: Conjunctivae normal.     Pupils: Pupils are equal, round, and reactive to light.  Cardiovascular:     Rate and Rhythm: Normal rate and regular rhythm.  Pulmonary:     Effort: Pulmonary effort is normal.     Breath sounds: Normal breath sounds.  Musculoskeletal:     Cervical back: Normal range of motion and neck supple.  Skin:    General: Skin is warm and dry.  Neurological:     General: No focal deficit present.     Mental Status: He is alert and oriented to person, place, and time.    Review of Systems  Psychiatric/Behavioral:  Positive for depression, substance abuse (ETOH Use) and suicidal ideas.     Blood pressure (S) (!) 158/118, pulse (!) 101, temperature 98.2 F (36.8 C), temperature source Temporal, resp. rate 16, SpO2 100%. There is no height or weight on file to calculate BMI.  Musculoskeletal: Strength & Muscle Tone: within normal limits Gait & Station: normal Patient leans: N/A   [facility] Discharge Disposition for Follow  up and Recommendations: Based on my evaluation I certify that psychiatric inpatient services furnished can reasonably be expected to improve the patient's condition which I recommend transfer to an appropriate accepting facility.    Severe episode of recurrent major depressive disorder, with psychotic features (HCC) - Restart home meds  Risperdal  1.5 mg twice daily for MDD and auditory hallucinations  Trazodone  50 mg at bedtime for sleep  Agitation protocol per EMR  Hx of paranoid schizophrenia, currently stable we will continue home Risperdal  and benztropine  1 mg twice daily for EPS  prophylaxis  Labs Ordered this encounter: TSH, due to worsening depression and rule out hypothyroidism Lipid, standard lab ordered for all patients received an antipsychotic therapy Ethanol, standard order for all patient reporting alcohol misuse or overuse, or to rule out intoxication in patient presenting with psychiatric complaint or behavioral changes  Hemoglobin A1c, standard lab ordered for all patients received an antipsychotic therapy CBC, standing order to rule out any infection or anemia CMP, rule out metabolic conditions which may be contributing to mental health symptoms UDS, standard order, rule out substance induced mood vs altered mental status related to substance use    Patient meets inpatient psychiatric treatment criteria and has been accepted to come [facility].  Damon Lesches, NP 07/10/2024, 11:48 AM
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
## 2024-07-10 NOTE — ED Notes (Signed)
 Patient transferred to  [facility]. Patient in NAD upon transfer. Belongings returned complete and intact. Staff escorted patient to back sallyport for transfer. Safety maintained.

## 2024-07-10 NOTE — Progress Notes (Signed)
 Pt has been accepted to [facility] on 07/10/2024 Bed assignment: 400-02  Pt meets inpatient criteria per: Suzen Lesches NP  Attending Physician will be: Dr. Taras MD   Report can be called to: Adult unit: [phone]  Pt can arrive after [facility] will update   Care Team Notified: [facility] Cherylynn Ernst RN, Suzen Lesches NP, Damien Fireman RN  [place] Yaritsa Savarino LCSW-A   07/10/2024 1:13 PM

## 2024-07-10 NOTE — Discharge Instructions (Addendum)
Accepted to [facility]

## 2024-07-11 ENCOUNTER — Encounter (HOSPITAL_COMMUNITY): Payer: Self-pay

## 2024-07-11 ENCOUNTER — Other Ambulatory Visit: Payer: Self-pay

## 2024-07-11 LAB — COMPREHENSIVE METABOLIC PANEL WITH GFR
ALT: 22 U/L (ref 0–44)
AST: 25 U/L (ref 15–41)
Albumin: 4.2 g/dL (ref 3.5–5.0)
Alkaline Phosphatase: 49 U/L (ref 38–126)
Anion gap: 14 (ref 5–15)
BUN: 11 mg/dL (ref 6–20)
CO2: 19 mmol/L — ABNORMAL LOW (ref 22–32)
Calcium: 9.4 mg/dL (ref 8.9–10.3)
Chloride: 104 mmol/L (ref 98–111)
Creatinine, Ser: 1.16 mg/dL (ref 0.61–1.24)
GFR, Estimated: >60 mL/min (ref >60)
Glucose, Bld: 169 mg/dL — ABNORMAL HIGH (ref 70–99)
Potassium: 3.8 mmol/L (ref 3.5–5.1)
Sodium: 137 mmol/L (ref 135–145)
Total Bilirubin: 0.4 mg/dL (ref 0.0–1.2)
Total Protein: 6.9 g/dL (ref 6.5–8.1)

## 2024-07-11 LAB — VITAMIN B12: Vitamin B-12: 528 pg/mL (ref 180–914)

## 2024-07-11 LAB — FOLATE: Folate: 14.6 ng/mL (ref >5.9)

## 2024-07-11 LAB — VITAMIN D 25 HYDROXY (VIT D DEFICIENCY, FRACTURES): Vit D, 25-Hydroxy: 29.45 ng/mL — ABNORMAL LOW (ref 30–100)

## 2024-07-11 NOTE — Group Note (Signed)
 Date:  07/11/2024 Time:  5:59 PM  Group Topic/Focus:  Coping With Mental Health Crisis:   The purpose of this group is to help patients identify strategies for coping with mental health crisis.  Group discusses possible causes of crisis and ways to manage them effectively. Developing a Wellness Toolbox:   The focus of this group is to help patients develop a wellness toolbox with skills and strategies to promote recovery upon discharge. Identifying Needs:   The focus of this group is to help patients identify their personal needs that have been historically problematic and identify healthy behaviors to address their needs.    Participation Level:  Active  Participation Quality:  Appropriate  Affect:  Appropriate  Cognitive:  Appropriate  Insight: Appropriate  Engagement in Group:  Engaged  Modes of Intervention:  Discussion  Additional Comments:    [name] Metro 07/11/2024, 5:59 PM

## 2024-07-11 NOTE — Group Note (Signed)
 Date:  07/11/2024 Time:  9:30 AM  Group Topic/Focus:  Goals Group:   The focus of this group is to help patients establish daily goals to achieve during treatment and discuss how the patient can incorporate goal setting into their daily lives to aide in recovery.    Participation Level:  Active  Participation Quality:  Appropriate  Affect:  Appropriate  Cognitive:  Appropriate  Insight: Appropriate  Engagement in Group:  Engaged  Modes of Intervention:  Discussion  Additional Comments:  pt is looking to stay on his meds,and plan for discharge  Nat Rummer 07/11/2024, 9:30 AM

## 2024-07-11 NOTE — Progress Notes (Signed)
(  Sleep Hours) - 7.5 (Any PRNs that were needed, meds refused, or side effects to meds)- PRN Hydroxyzine  and PRN Tylenol   (Any disturbances and when (visitation, over night)-  Pt had an elevated BP and symptomatic with a headache. Contacted on-call provider. Pt was given Amlodipine  per MAR.   (Concerns raised by the patient)- None (SI/HI/AVH)- Passive SI, contracts for safety. Endorses HI towards baby momma. Denies AVH

## 2024-07-11 NOTE — [facility] Group Notes (Addendum)
 Spiritual care group facilitated by Chaplain Rockie Sofia, [facility]   Group focused on topic of strength. Group members reflected on what thoughts and feelings emerge when they hear this topic. They then engaged in facilitated dialog around how strength is present in their lives. This dialog focused on representing what strength had been to them in their lives (images and patterns given) and what they saw as helpful in their life now (what they needed / wanted).  Activity drew on narrative framework.  Patient Progress: Damon [name] attended group and actively engaged and participated in group conversation and activities.

## 2024-07-11 NOTE — Group Note (Signed)
 Recreation Therapy Group Note   Group Topic:Communication  Group Date: 07/11/2024 Start Time: 0935 End Time: 1000 Facilitators: Tylee Newby-McCall, LRT,CTRS Location: [address] Dayroom   Group Topic: Communication, Problem Solving   Goal Area(s) Addresses:  Patient will effectively listen to complete activity.  Patient will identify communication skills used to make activity successful.  Patient will identify how skills used during activity can be used to reach post d/c goals.    Behavioral Response:    Intervention: Building surveyor Activity - Geometric pattern cards, pencils, blank paper    Activity: Geometric Drawings.  Three volunteers from the peer group will be shown an abstract picture with a particular arrangement of geometrical shapes.  Each round, one 'speaker' will describe the pattern, as accurately as possible without revealing the image to the group.  The remaining group members will listen and draw the picture to reflect how it is described to them. Patients with the role of 'listener' cannot ask clarifying questions but, may request that the speaker repeat a direction. Once the drawings are complete, the presenter will show the rest of the group the picture and compare how close each person came to drawing the picture. LRT will facilitate a post-activity discussion regarding effective communication and the importance of planning, listening, and asking for clarification in daily interactions with others.  Education: Environmental consultant, Active listening, Support systems, Discharge planning  Education Outcome: Acknowledges understanding/In group clarification offered/Needs additional education.    Affect/Mood: N/A   Participation Level: Did not attend    Clinical Observations/Individualized Feedback:     Plan: Continue to engage patient in RT group sessions 2-3x/week.   Amin Fornwalt-McCall, LRT,CTRS 07/11/2024 11:35 AM

## 2024-07-11 NOTE — BH IP Treatment Plan (Addendum)
 Interdisciplinary Treatment and Diagnostic Plan Update  07/11/2024 Time of Session: 10:50 AM Damon [name] MRN: [id number]  Principal Diagnosis: MDD (major depressive disorder), recurrent episode, severe (HCC)  Secondary Diagnoses: Principal Problem:   MDD (major depressive disorder), recurrent episode, severe (HCC)   Current Medications:  Current Facility-Administered Medications  Medication Dose Route Frequency Provider Last Rate Last Admin   acetaminophen  (TYLENOL ) tablet 650 mg  650 mg Oral Q6H PRN Arloa Suzen RAMAN, NP   650 mg at 07/10/24 2139   alum & mag hydroxide-simeth (MAALOX/MYLANTA) 200-200-20 MG/5ML suspension 30 mL  30 mL Oral Q4H PRN Arloa Suzen RAMAN, NP       amLODipine  (NORVASC ) tablet 10 mg  10 mg Oral Daily Onuoha, Chinwendu V, NP   10 mg at 07/11/24 0833   benazepril  (LOTENSIN ) tablet 10 mg  10 mg Oral Daily Arloa Suzen RAMAN, NP   10 mg at 07/11/24 9166   benztropine  (COGENTIN ) tablet 1 mg  1 mg Oral BID Arloa Suzen RAMAN, NP   1 mg at 07/11/24 1728   haloperidol  (HALDOL ) tablet 5 mg  5 mg Oral TID PRN Arloa Suzen RAMAN, NP       And   diphenhydrAMINE  (BENADRYL ) capsule 50 mg  50 mg Oral TID PRN Arloa Suzen RAMAN, NP       haloperidol  lactate (HALDOL ) injection 5 mg  5 mg Intramuscular TID PRN Arloa Suzen RAMAN, NP       And   diphenhydrAMINE  (BENADRYL ) injection 50 mg  50 mg Intramuscular TID PRN Arloa Suzen RAMAN, NP       And   LORazepam  (ATIVAN ) injection 2 mg  2 mg Intramuscular TID PRN Arloa Suzen RAMAN, NP       haloperidol  lactate (HALDOL ) injection 10 mg  10 mg Intramuscular TID PRN Arloa Suzen RAMAN, NP       And   diphenhydrAMINE  (BENADRYL ) injection 50 mg  50 mg Intramuscular TID PRN Arloa Suzen RAMAN, NP       And   LORazepam  (ATIVAN ) injection 2 mg  2 mg Intramuscular TID PRN Arloa Suzen RAMAN, NP       hydrOXYzine  (ATARAX ) tablet 25 mg  25 mg Oral TID PRN Arloa Suzen RAMAN, NP   25 mg at 07/10/24 2139   magnesium  hydroxide (MILK  OF MAGNESIA) suspension 30 mL  30 mL Oral Daily PRN Arloa Suzen RAMAN, NP       nicotine  (NICODERM CQ  - dosed in mg/24 hours) patch 21 mg  21 mg Transdermal Daily PRN Arloa Suzen RAMAN, NP   21 mg at 07/11/24 1247   risperiDONE  (RISPERDAL  M-TABS) disintegrating tablet 1.5 mg  1.5 mg Oral BID Arloa Suzen RAMAN, NP   1.5 mg at 07/11/24 1728   traZODone  (DESYREL ) tablet 50 mg  50 mg Oral QHS Arloa Suzen RAMAN, NP   50 mg at 07/10/24 2139   PTA Medications: Medications Prior to Admission  Medication Sig Dispense Refill Last Dose/Taking   acetaminophen  (TYLENOL ) 500 MG tablet Take 500 mg by mouth every 6 (six) hours as needed for mild pain (pain score 1-3) or moderate pain (pain score 4-6).      amLODipine  (NORVASC ) 10 MG tablet Take 1 tablet (10 mg total) by mouth daily. 14 tablet 0    benazepril  (LOTENSIN ) 10 MG tablet Take 1 tablet (10 mg total) by mouth daily. 14 tablet 0    benztropine  (COGENTIN ) 1 MG tablet Take 1 tablet (1 mg total) by mouth 2 (two) times daily.  60 tablet 0    Multiple Vitamin (MULTIVITAMIN) tablet Take 1 tablet by mouth daily.      nicotine  (NICODERM CQ  - DOSED IN MG/24 HOURS) 21 mg/24hr patch Place 1 patch (21 mg total) onto the skin daily. 14 patch 0    Nutritional Supplements (ENSURE ORIGINAL) LIQD Take 1 Bottle by mouth 3 (three) times daily.      risperiDONE  (RISPERDAL ) 0.5 MG tablet Take 3 tablets (1.5 mg total) by mouth 2 (two) times daily. 180 tablet 0    traZODone  (DESYREL ) 50 MG tablet Take 1 tablet (50 mg total) by mouth at bedtime as needed for sleep. 30 tablet 0     Patient Stressors: Financial difficulties   Marital or family conflict   Medication change or noncompliance   Traumatic event    Patient Strengths: Active sense of humor  Average or above average intelligence  Capable of independent living  Communication skills  General fund of knowledge  Motivation for treatment/growth  Supportive family/friends   Treatment Modalities: Medication  Management, Group therapy, Case management,  1 to 1 session with clinician, Psychoeducation, Recreational therapy.   Physician Treatment Plan for Primary Diagnosis: MDD (major depressive disorder), recurrent episode, severe (HCC) Long Term Goal(s): Improvement in symptoms so as ready for discharge   Short Term Goals: Ability to identify and develop effective coping behaviors will improve Ability to maintain clinical measurements within normal limits will improve Compliance with prescribed medications will improve Ability to identify triggers associated with substance abuse/mental health issues will improve Ability to identify changes in lifestyle to reduce recurrence of condition will improve Ability to verbalize feelings will improve Ability to disclose and discuss suicidal ideas Ability to demonstrate self-control will improve  Medication Management: Evaluate patient's response, side effects, and tolerance of medication regimen.  Therapeutic Interventions: 1 to 1 sessions, Unit Group sessions and Medication administration.  Evaluation of Outcomes: Not Progressing  Physician Treatment Plan for Secondary Diagnosis: Principal Problem:   MDD (major depressive disorder), recurrent episode, severe (HCC)  Long Term Goal(s): Improvement in symptoms so as ready for discharge   Short Term Goals: Ability to identify and develop effective coping behaviors will improve Ability to maintain clinical measurements within normal limits will improve Compliance with prescribed medications will improve Ability to identify triggers associated with substance abuse/mental health issues will improve Ability to identify changes in lifestyle to reduce recurrence of condition will improve Ability to verbalize feelings will improve Ability to disclose and discuss suicidal ideas Ability to demonstrate self-control will improve     Medication Management: Evaluate patient's response, side effects, and  tolerance of medication regimen.  Therapeutic Interventions: 1 to 1 sessions, Unit Group sessions and Medication administration.  Evaluation of Outcomes: Not Progressing   RN Treatment Plan for Primary Diagnosis: MDD (major depressive disorder), recurrent episode, severe (HCC) Long Term Goal(s): Knowledge of disease and therapeutic regimen to maintain health will improve  Short Term Goals: Ability to remain free from injury will improve, Ability to verbalize frustration and anger appropriately will improve, Ability to demonstrate self-control, Ability to participate in decision making will improve, Ability to verbalize feelings will improve, Ability to disclose and discuss suicidal ideas, Ability to identify and develop effective coping behaviors will improve, and Compliance with prescribed medications will improve  Medication Management: RN will administer medications as ordered by provider, will assess and evaluate patient's response and provide education to patient for prescribed medication. RN will report any adverse and/or side effects to prescribing provider.  Therapeutic Interventions:  1 on 1 counseling sessions, Psychoeducation, Medication administration, Evaluate responses to treatment, Monitor vital signs and CBGs as ordered, Perform/monitor CIWA, COWS, AIMS and Fall Risk screenings as ordered, Perform wound care treatments as ordered.  Evaluation of Outcomes: Not Progressing   LCSW Treatment Plan for Primary Diagnosis: MDD (major depressive disorder), recurrent episode, severe (HCC) Long Term Goal(s): Safe transition to appropriate next level of care at discharge, Engage patient in therapeutic group addressing interpersonal concerns.  Short Term Goals: Engage patient in aftercare planning with referrals and resources, Increase social support, Increase ability to appropriately verbalize feelings, Increase emotional regulation, Facilitate acceptance of mental health diagnosis and  concerns, Facilitate patient progression through stages of change regarding substance use diagnoses and concerns, Identify triggers associated with mental health/substance abuse issues, and Increase skills for wellness and recovery  Therapeutic Interventions: Assess for all discharge needs, 1 to 1 time with Social worker, Explore available resources and support systems, Assess for adequacy in community support network, Educate family and significant other(s) on suicide prevention, Complete Psychosocial Assessment, Interpersonal group therapy.  Evaluation of Outcomes: Not Progressing   Progress in Treatment: Attending groups: Yes. Participating in groups: Yes. Taking medication as prescribed: Yes. Toleration medication: Yes. Family/Significant other contact made: No, will contact:  Nethaniel Mattie (significant other) [phone]  Patient understands diagnosis: Yes. Discussing patient identified problems/goals with staff: Yes. Medical problems stabilized or resolved: Yes. Denies suicidal/homicidal ideation: Yes. Issues/concerns per patient self-inventory: No.  New problem(s) identified:  No  New Short Term/Long Term Goal(s):    medication stabilization, elimination of SI thoughts, development of comprehensive mental wellness plan.    Patient Goals:  I want to get help with my depression.  There's a lot going on in my life right now.   Discharge Plan or Barriers:  Patient recently admitted. CSW will continue to follow and assess for appropriate referrals and possible discharge planning.     Reason for Continuation of Hospitalization: Depression Hallucinations Medication stabilization  Estimated Length of Stay:  5- 7 days  Last 3 [place] Suicide Severity Risk Score: Flowsheet Row [facility] Most recent reading at 07/10/2024  7:45 PM ED from 07/10/2024 in [facility] Most  recent reading at 07/10/2024  1:03 PM Admission (Discharged) from 05/11/2024 in [facility] Most recent reading at 05/17/2024  8:30 PM  C-SSRS RISK CATEGORY High Risk High Risk Error: Q7 should not be populated when Q6 is No    Last PHQ 2/9 Scores:    01/31/2024   10:53 AM 01/29/2024    6:50 AM 01/13/2023    9:19 AM  Depression screen PHQ 2/9  Decreased Interest 0 1 2  Down, Depressed, Hopeless 0 1 3  PHQ - 2 Score 0 2 5  Altered sleeping 0 0 3  Tired, decreased energy 1 0 3  Change in appetite 0 0 3  Feeling bad or failure about yourself  0 0 2  Trouble concentrating 1 1 2   Moving slowly or fidgety/restless 0 0 3  Suicidal thoughts 0 0 0  PHQ-9 Score 2 3 21   Difficult doing work/chores Not difficult at all Somewhat difficult Extremely dIfficult    Scribe for Treatment Team: Hipolito Martinezlopez O Yadiel Aubry, LCSWA 07/11/2024 7:36 PM

## 2024-07-11 NOTE — Progress Notes (Addendum)
 Admission Note:   58 yr male who presents [facility] in no acute distress for the treatment of SI with a plan to stab himself with a knife. Pt reports worsening depression since finding out six months ago that the 59 year old child he has helped raise is not his biological son. Pt verbalized that his baby momma is having legal troubles and his child was placed under DSS custody. Reports that he was unable to take custody of the child as the paternity test for the child indicated that he is not the child's father and due to his mental health struggles. Pt verbalized worsening depression from not being able to be in contact with the child.  Pt endorsed experiencing AVH. Reports experiencing visual hallucinations of dark images and auditory hallucinations of a voice telling him he is losing track.  Pt has past medical Hx of Hypertension, schizophrenia. Reports inconsistently taking his medication due to being unable to afford his medications. Pt is unemployed and lives alone. Reports recently getting married about 3 months ago but are currently not living together.    Pt was calm and cooperative with admission process. Pt presents with passive SI and contracts for safety upon admission. Pt denies AVH . Pt endorses HI towards his baby momma. Skin was assessed and found to be clear of any abnormal marks apart from a scar on his chest. PT searched and no contraband found, POC and unit policies explained and understanding verbalized. Consents obtained. Food and fluids offered, and fluids accepted. Pt had no additional questions or concerns.

## 2024-07-11 NOTE — Plan of Care (Signed)
   Problem: Education: Goal: Emotional status will improve Outcome: Progressing Goal: Mental status will improve Outcome: Progressing Goal: Verbalization of understanding the information provided will improve Outcome: Progressing   Problem: Activity: Goal: Interest or engagement in activities will improve Outcome: Progressing

## 2024-07-11 NOTE — [facility] Counselor (Signed)
 Adult Comprehensive Assessment  Patient ID: MAVERIC DEBONO, male   DOB: February 25, 1966, 58 y.o.   MRN: [id number]  Information Source: Information source: Patient  Current Stressors:  Patient states their primary concerns and needs for treatment are:: Very traumatized about finding out my DNA test about my child, finding out he wasn't my child. Patient states their goals for this hospitilization and ongoing recovery are:: To get my confidence back and be prepared for discharge. Believe that people aren't going to hurt me and that I won't hurt myself. Educational / Learning stressors: None reported Employment / Job issues: None reported Family Relationships: Problems with my baby mother and my son Surveyor, quantity / Lack of resources (include bankruptcy): None reported Housing / Lack of housing: None reported Physical health (include injuries & life threatening diseases): None reported Social relationships: None reported Substance abuse: I tried drinking recently once a week Bereavement / Loss: None reported  Living/Environment/Situation:  Living Arrangements: Alone Living conditions (as described by patient or guardian): It's good, I got a place to stay Who else lives in the home?: Just the patient How long has patient lived in current situation?: 5 years now What is atmosphere in current home: Comfortable  Family History:  Long term relationship, how long?: Almost 4-5 years now What types of issues is patient dealing with in the relationship?: None reported Are you sexually active?: Yes How many children?: 5 How is patient's relationship with their children?: 4 children over 18 that are all in the [employer] and 1 child that is 11 years. As of right now child is living with biological grandmother. Talk to them when I can, they travel a lot. They're supportive. With my 59 year old it's a distant love right now.  Childhood History:     Education:     Employment/Work  Situation:      Architect:   Surveyor, quantity resources: Insurance claims handler, Medicaid, Medicare Does patient have a Lawyer or guardian?: No  Alcohol/Substance Abuse:   What has been your use of drugs/alcohol within the last 12 months?: Pt reports drinking alcohol once a week about 5 months ago but has stopped now. If attempted suicide, did drugs/alcohol play a role in this?: No Alcohol/Substance Abuse Treatment Hx: Denies past history Has alcohol/substance abuse ever caused legal problems?: No  Social Support System:   Patient's Community Support System: Good Describe Community Support System: It's good, my fiancee is supportive.  Leisure/Recreation:      Strengths/Needs:   Patient states these barriers may affect/interfere with their treatment: Just myself, trust that you guys are really here to help me. Instead of thinking you are against me. Patient states these barriers may affect their return to the community: None reported Other important information patient would like considered in planning for their treatment: Yes, I'd like to consider having a psychiatric doctor that I can regularly go to.  Discharge Plan:   Currently receiving community mental health services: No Patient states concerns and preferences for aftercare planning are: Pt would like to set up psychiatry and eventually therapy Patient states they will know when they are safe and ready for discharge when: Right now, I know the voices are not real. I can ask for help and I've got confidence to do my daily chores. So it's working so far. Does patient have access to transportation?: No Does patient have financial barriers related to discharge medications?: No Plan for no access to transportation at discharge: CSW to assist with transportation needs Will  patient be returning to same living situation after discharge?: Yes  Summary/Recommendations:   Summary and Recommendations (to be completed by  the evaluator): Kadence Mimbs is a 58 y.o male voluntarily admitted to [facility] to [facility] due to SI with a plan. Patient currently denies SI and HI but endorses AVH stating I hear voices in my head scrambling and telling me to hurt myself. I see images going across my face. I'd see a red face with horns like the devil. It started last night but it's fading away. Medication helps a whole lot. Patient states his main stressor as problems with my baby mother and my son. Patient recently discovered his 75 year-old son (who currently lives with his biological maternal grandmother) is not his biological son. Patient also reports anxiety regarding his AVH stating that they often make him distrusting of others. Patient has no history of substance abuse and states he was drinking 1x/week about 5 months ago but no longer drinks alcohol, UDS negative for all. Patient receives disability and has no concerns reagarding finances or housing. No reported history of incarceration. Patient's goal for treatment is to establish consistent psychiatric care and perhaps therapy.  While here, Josip can benefit from crisis stabilization, medication management, therapeutic milieu, and referrals for services.   Louetta Lame. 07/11/2024

## 2024-07-11 NOTE — BH IP Treatment Plan (Unsigned)
 Interdisciplinary Treatment and Diagnostic Plan Update  07/11/2024 Time of Session: 10:50 AM BERTIL BRICKEY MRN: [id number]  Principal Diagnosis: MDD (major depressive disorder), recurrent episode, severe (HCC)  Secondary Diagnoses: Principal Problem:   MDD (major depressive disorder), recurrent episode, severe (HCC)   Current Medications:  Current Facility-Administered Medications  Medication Dose Route Frequency Provider Last Rate Last Admin   acetaminophen  (TYLENOL ) tablet 650 mg  650 mg Oral Q6H PRN Arloa Suzen RAMAN, NP   650 mg at 07/10/24 2139   alum & mag hydroxide-simeth (MAALOX/MYLANTA) 200-200-20 MG/5ML suspension 30 mL  30 mL Oral Q4H PRN Arloa Suzen RAMAN, NP       amLODipine  (NORVASC ) tablet 10 mg  10 mg Oral Daily Onuoha, Chinwendu V, NP   10 mg at 07/11/24 0833   benazepril  (LOTENSIN ) tablet 10 mg  10 mg Oral Daily Arloa Suzen RAMAN, NP   10 mg at 07/11/24 9166   benztropine  (COGENTIN ) tablet 1 mg  1 mg Oral BID Arloa Suzen RAMAN, NP   1 mg at 07/11/24 9166   haloperidol  (HALDOL ) tablet 5 mg  5 mg Oral TID PRN Arloa Suzen RAMAN, NP       And   diphenhydrAMINE  (BENADRYL ) capsule 50 mg  50 mg Oral TID PRN Arloa Suzen RAMAN, NP       haloperidol  lactate (HALDOL ) injection 5 mg  5 mg Intramuscular TID PRN Arloa Suzen RAMAN, NP       And   diphenhydrAMINE  (BENADRYL ) injection 50 mg  50 mg Intramuscular TID PRN Arloa Suzen RAMAN, NP       And   LORazepam  (ATIVAN ) injection 2 mg  2 mg Intramuscular TID PRN Arloa Suzen RAMAN, NP       haloperidol  lactate (HALDOL ) injection 10 mg  10 mg Intramuscular TID PRN Arloa Suzen RAMAN, NP       And   diphenhydrAMINE  (BENADRYL ) injection 50 mg  50 mg Intramuscular TID PRN Arloa Suzen RAMAN, NP       And   LORazepam  (ATIVAN ) injection 2 mg  2 mg Intramuscular TID PRN Arloa Suzen RAMAN, NP       hydrOXYzine  (ATARAX ) tablet 25 mg  25 mg Oral TID PRN Arloa Suzen RAMAN, NP   25 mg at 07/10/24 2139   magnesium  hydroxide (MILK  OF MAGNESIA) suspension 30 mL  30 mL Oral Daily PRN Arloa Suzen RAMAN, NP       nicotine  (NICODERM CQ  - dosed in mg/24 hours) patch 21 mg  21 mg Transdermal Daily PRN Arloa Suzen RAMAN, NP   21 mg at 07/11/24 1247   risperiDONE  (RISPERDAL  M-TABS) disintegrating tablet 1.5 mg  1.5 mg Oral BID Arloa Suzen RAMAN, NP   1.5 mg at 07/11/24 9166   traZODone  (DESYREL ) tablet 50 mg  50 mg Oral QHS Arloa Suzen RAMAN, NP   50 mg at 07/10/24 2139   PTA Medications: Medications Prior to Admission  Medication Sig Dispense Refill Last Dose/Taking   acetaminophen  (TYLENOL ) 500 MG tablet Take 500 mg by mouth every 6 (six) hours as needed for mild pain (pain score 1-3) or moderate pain (pain score 4-6).      amLODipine  (NORVASC ) 10 MG tablet Take 1 tablet (10 mg total) by mouth daily. 14 tablet 0    benazepril  (LOTENSIN ) 10 MG tablet Take 1 tablet (10 mg total) by mouth daily. 14 tablet 0    benztropine  (COGENTIN ) 1 MG tablet Take 1 tablet (1 mg total) by mouth 2 (two) times daily.  60 tablet 0    Multiple Vitamin (MULTIVITAMIN) tablet Take 1 tablet by mouth daily.      nicotine  (NICODERM CQ  - DOSED IN MG/24 HOURS) 21 mg/24hr patch Place 1 patch (21 mg total) onto the skin daily. 14 patch 0    Nutritional Supplements (ENSURE ORIGINAL) LIQD Take 1 Bottle by mouth 3 (three) times daily.      risperiDONE  (RISPERDAL ) 0.5 MG tablet Take 3 tablets (1.5 mg total) by mouth 2 (two) times daily. 180 tablet 0    traZODone  (DESYREL ) 50 MG tablet Take 1 tablet (50 mg total) by mouth at bedtime as needed for sleep. 30 tablet 0     Patient Stressors: Financial difficulties   Marital or family conflict   Medication change or noncompliance   Traumatic event    Patient Strengths: Active sense of humor  Average or above average intelligence  Capable of independent living  Communication skills  General fund of knowledge  Motivation for treatment/growth  Supportive family/friends   Treatment Modalities: Medication  Management, Group therapy, Case management,  1 to 1 session with clinician, Psychoeducation, Recreational therapy.   Physician Treatment Plan for Primary Diagnosis: MDD (major depressive disorder), recurrent episode, severe (HCC) Long Term Goal(s): Improvement in symptoms so as ready for discharge   Short Term Goals: Ability to identify and develop effective coping behaviors will improve Ability to maintain clinical measurements within normal limits will improve Compliance with prescribed medications will improve Ability to identify triggers associated with substance abuse/mental health issues will improve Ability to identify changes in lifestyle to reduce recurrence of condition will improve Ability to verbalize feelings will improve Ability to disclose and discuss suicidal ideas Ability to demonstrate self-control will improve  Medication Management: Evaluate patient's response, side effects, and tolerance of medication regimen.  Therapeutic Interventions: 1 to 1 sessions, Unit Group sessions and Medication administration.  Evaluation of Outcomes: Not Progressing  Physician Treatment Plan for Secondary Diagnosis: Principal Problem:   MDD (major depressive disorder), recurrent episode, severe (HCC)  Long Term Goal(s): Improvement in symptoms so as ready for discharge   Short Term Goals: Ability to identify and develop effective coping behaviors will improve Ability to maintain clinical measurements within normal limits will improve Compliance with prescribed medications will improve Ability to identify triggers associated with substance abuse/mental health issues will improve Ability to identify changes in lifestyle to reduce recurrence of condition will improve Ability to verbalize feelings will improve Ability to disclose and discuss suicidal ideas Ability to demonstrate self-control will improve     Medication Management: Evaluate patient's response, side effects, and  tolerance of medication regimen.  Therapeutic Interventions: 1 to 1 sessions, Unit Group sessions and Medication administration.  Evaluation of Outcomes: Not Progressing   RN Treatment Plan for Primary Diagnosis: MDD (major depressive disorder), recurrent episode, severe (HCC) Long Term Goal(s): Knowledge of disease and therapeutic regimen to maintain health will improve  Short Term Goals: Ability to remain free from injury will improve, Ability to verbalize frustration and anger appropriately will improve, Ability to demonstrate self-control, Ability to participate in decision making will improve, Ability to verbalize feelings will improve, Ability to disclose and discuss suicidal ideas, Ability to identify and develop effective coping behaviors will improve, and Compliance with prescribed medications will improve  Medication Management: RN will administer medications as ordered by provider, will assess and evaluate patient's response and provide education to patient for prescribed medication. RN will report any adverse and/or side effects to prescribing provider.  Therapeutic Interventions:  1 on 1 counseling sessions, Psychoeducation, Medication administration, Evaluate responses to treatment, Monitor vital signs and CBGs as ordered, Perform/monitor CIWA, COWS, AIMS and Fall Risk screenings as ordered, Perform wound care treatments as ordered.  Evaluation of Outcomes: Not Progressing   LCSW Treatment Plan for Primary Diagnosis: MDD (major depressive disorder), recurrent episode, severe (HCC) Long Term Goal(s): Safe transition to appropriate next level of care at discharge, Engage patient in therapeutic group addressing interpersonal concerns.  Short Term Goals: Engage patient in aftercare planning with referrals and resources, Increase social support, Increase ability to appropriately verbalize feelings, Increase emotional regulation, Facilitate acceptance of mental health diagnosis and  concerns, Facilitate patient progression through stages of change regarding substance use diagnoses and concerns, Identify triggers associated with mental health/substance abuse issues, and Increase skills for wellness and recovery  Therapeutic Interventions: Assess for all discharge needs, 1 to 1 time with Social worker, Explore available resources and support systems, Assess for adequacy in community support network, Educate family and significant other(s) on suicide prevention, Complete Psychosocial Assessment, Interpersonal group therapy.  Evaluation of Outcomes: Not Progressing   Progress in Treatment: Attending groups: attended some groups Participating in groups: Yes Taking medication as prescribed: Yes. Toleration medication: Yes. Family/Significant other contact made: No, will contact:  Richerd Fischer [phone] Patient understands diagnosis: Yes. Discussing patient identified problems/goals with staff: Yes. Medical problems stabilized or resolved: Yes. Denies suicidal/homicidal ideation: Yes. Issues/concerns per patient self-inventory: No.  New problem(s) identified:  No  New Short Term/Long Term Goal(s):   medication stabilization, elimination of SI thoughts, development of comprehensive mental wellness plan.    Patient Goals:  I want to get help with  my depression.  There's a lot going on in my life right now.   Discharge Plan or Barriers:  Patient recently admitted. CSW will continue to follow and assess for appropriate referrals and possible discharge planning.    Reason for Continuation of Hospitalization: Depression Suicidal ideation  Estimated Length of Stay:  5 - 7 days  Last 3 [place] Suicide Severity Risk Score: Flowsheet Row [facility]Current[facility] Most recent reading at 07/10/2024  7:45 PM ED from 07/10/2024 in [facility] Most recent reading at 07/10/2024  1:03 PM  Admission (Discharged) from 05/11/2024 in [facility] Most recent reading at 05/17/2024  8:30 PM  C-SSRS RISK CATEGORY High Risk High Risk Error: Q7 should not be populated when Q6 is No    Last PHQ 2/9 Scores:    01/31/2024   10:53 AM 01/29/2024    6:50 AM 01/13/2023    9:19 AM  Depression screen PHQ 2/9  Decreased Interest 0 1 2  Down, Depressed, Hopeless 0 1 3  PHQ - 2 Score 0 2 5  Altered sleeping 0 0 3  Tired, decreased energy 1 0 3  Change in appetite 0 0 3  Feeling bad or failure about yourself  0 0 2  Trouble concentrating 1 1 2   Moving slowly or fidgety/restless 0 0 3  Suicidal thoughts 0 0 0  PHQ-9 Score 2 3 21   Difficult doing work/chores Not difficult at all Somewhat difficult Extremely dIfficult    Scribe for Treatment Team: Rabiah Goeser O Shyla Gayheart, LCSWA 07/11/2024 1:52 PM

## 2024-07-11 NOTE — [facility] Suicide Risk Assessment (Signed)
 Suicide Risk Assessment  Admission Assessment    [facility] Admission Suicide Risk Assessment   Nursing information obtained from:  Patient  Demographic factors:  Male, Low socioeconomic status, Living alone, Unemployed  Current Mental Status:  Suicidal ideation indicated by patient  Loss Factors:  Loss of significant relationship  Historical Factors:  Prior suicide attempts, Impulsivity, Family history of mental illness or substance abuse  Risk Reduction Factors:  Positive social support  Total Time spent with patient: 1.5 hours  Principal Problem: MDD (major depressive disorder), recurrent episode, severe (HCC) Diagnosis:  Principal Problem:   MDD (major depressive disorder), recurrent episode, severe (HCC)  Subjective Data: See H&P.  Continued Clinical Symptoms:  Alcohol Use Disorder Identification Test Final Score (AUDIT): 4 The Alcohol Use Disorders Identification Test, Guidelines for Use in Primary Care, Second Edition.  World Science writer [facility]). Score between 0-7:  no or low risk or alcohol related problems. Score between 8-15:  moderate risk of alcohol related problems. Score between 16-19:  high risk of alcohol related problems. Score 20 or above:  warrants further diagnostic evaluation for alcohol dependence and treatment.   CLINICAL FACTORS:   Schizophrenia:   Paranoid or undifferentiated type Previous Psychiatric Diagnoses and Treatments   Musculoskeletal: Strength & Muscle Tone: within normal limits Gait & Station: normal Patient leans: N/A  Psychiatric Specialty Exam:  Presentation  General Appearance:  Casual; Fairly Groomed  Eye Contact: Fair  Speech: Clear and Coherent; Normal Rate  Speech Volume: Normal  Handedness: Right   Mood and Affect  Mood: Anxious; Depressed (Rates both depression & anxiety #5.)  Affect: Congruent   Thought Process  Thought Processes: Coherent; Goal Directed; Linear  Descriptions of  Associations:Intact  Orientation:Full (Time, Place and Person)  Thought Content:Logical  History of Schizophrenia/Schizoaffective disorder:Yes  Duration of Psychotic Symptoms:Greater than six months  Hallucinations:Hallucinations: None Description of Auditory Hallucinations: Denies hearing any voices at this time.  Ideas of Reference:None  Suicidal Thoughts:Suicidal Thoughts: No SI Active Intent and/or Plan: With Plan  Homicidal Thoughts:Homicidal Thoughts: No   Sensorium  Memory: Immediate Good; Recent Good; Remote Good  Judgment: Fair  Insight: Fair   Art therapist  Concentration: Good  Attention Span: Good  Recall: Good  Fund of Knowledge: Good  Language: Good  Psychomotor Activity  Psychomotor Activity: Psychomotor Activity: Normal  Assets  Assets: Communication Skills; Desire for Improvement; Financial Resources/Insurance; Housing; Resilience; Social Support  Sleep  Sleep: Sleep: Good Number of Hours of Sleep: 7.5  Physical Exam: Physical Exam HENT:     Head: Normocephalic.     Nose: Nose normal.  Cardiovascular:     Comments: Elevated blood pressure: 136/95.  Pulse rate :103. Pulmonary:     Effort: Pulmonary effort is normal.  Genitourinary:    Comments: Deferred. Musculoskeletal:        General: Normal range of motion.     Cervical back: Normal range of motion.  Skin:    General: Skin is dry.  Neurological:     General: No focal deficit present.     Mental Status: He is alert and oriented to person, place, and time.    Review of Systems  Constitutional:  Negative for chills, diaphoresis and fever.  HENT:  Negative for congestion and sore throat.   Eyes:  Negative for blurred vision.  Respiratory:  Negative for cough, shortness of breath and wheezing.   Cardiovascular:  Negative for chest pain and palpitations.  Gastrointestinal:  Negative for abdominal pain, constipation, diarrhea, heartburn, nausea and vomiting.  Musculoskeletal:  Negative for joint pain and myalgias.  Neurological:  Negative for dizziness, tingling, tremors, sensory change, speech change, focal weakness, seizures, loss of consciousness, weakness and headaches.  Endo/Heme/Allergies:        Allergies: Zofran .   Other: Spinach.  Psychiatric/Behavioral:  Positive for depression and suicidal ideas (Hx of.). Negative for hallucinations (Hx of.), memory loss and substance abuse. The patient is nervous/anxious and has insomnia (On Trazodone ).    Blood pressure (!) 136/95, pulse (!) 103, temperature 97.8 F (36.6 C), temperature source Oral, resp. rate 16, height 5' 11 (1.803 m), weight 77.6 kg, SpO2 100%. Body mass index is 23.85 kg/m.  COGNITIVE FEATURES THAT CONTRIBUTE TO RISK:  Thought constriction (tunnel vision)    SUICIDE RISK:   Moderate:  Frequent suicidal ideation with limited intensity, and duration, some specificity in terms of plans, no associated intent, good self-control, limited dysphoria/symptomatology, some risk factors present, and identifiable protective factors, including available and accessible social support.  PLAN OF CARE: See H&P.  I certify that inpatient services furnished can reasonably be expected to improve the patient's condition.   Mac Bolster, NP, pmhnp, [id number]. 07/11/2024, 11:35 AM

## 2024-07-11 NOTE — H&P (Cosign Needed Addendum)
 Psychiatric Admission Assessment Adult  Patient Identification: Damon [name]  MRN:  [id number]  Date of Evaluation:  07/11/2024  Chief Complaint: Worsening symptoms of depression/suicidal ideations & auditory hallucinations.  Principal Diagnosis: MDD (major depressive disorder), recurrent episode, severe (HCC)  Diagnosis:  Principal Problem:   MDD (major depressive disorder), recurrent episode, severe (HCC)  History of Present Illness:This is one of several psychiatric admission evaluations in this [facility] & other [facility] systems for this 58 year old African-American male with prior history of paranoid schizophrenia.  Patient has been in this [facility] numerous times for issues related to mood instability/psychosis. Admitted in this [facility] this time around with complaints of worsening depression triggering suicidal ideations suicidal ideations. 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 Chart review reports indicated that this patient has hx of suicidal ideations with plan to stab himself. Also, he has hx of noncompliance to his recommended treatment regimen. Patient chart is reviewed including current lab results. Patient recently admitted & treated at the [facility] behavioral unit in June of this year, 2025 with similar complaints. He was discharged after 7 days with an outpatient psychiatric recommendation for medication management. During this evaluation, Damon [name] reports,   I went to the [facility] place on Monday, two days ago. I was depressed & hearing voices. Then I start to feel suicidal. I was going to use a knife to slit my wrist, but I looked for help instead. Prior to the depression & hearing voices, I was thinking of & worried about my son. Well, this child is not my son any more. I recently received a letter in the mail according to the DNA result, I was not this kid's  biological father. I have been in this child's life & he has been in mine since he was born. All this started after my baby-mama got in trouble with the lord due to her behavior. The DSS got involved because the child got his by a car & the mama was facing a jail time. The DSS were wondering who is going to care for this kid since his mama will be in jail. So, the DSS required me to take a paternity test & it confirmed that this child was never mine to begin with. The child was taken a way & was placed with his maternal grandmother while he was recovery from being hit by a car. Now, this kid is gone out of my life. For one year. I miss him so much & I think about him all the time. That was the reason that got me feeling very depressed & I felt like that I did not want be alive any more. I have been dealing with mental illness since high school. I suffer from paranoid schizophrenia. I have been feeling very depressed since this past Monday. I had planned to use a knife to cut my wrist. However, I sort help instead. Previously, I was on Risperdal  & cogentin . I have felt like hurting myself previously x 4. I have never attempted suicide. Schizophrenia runs in my family. My father & brother had it. My sister also had it, but she passed away. My mood fluctuates a lot & I sleep poorly at night.   Miles currently denies any SIHI, AVH, delusional thoughts or paranoia. He does not appear to be responding to any internal stimuli. This case is discussed with the treatment during the team meeting  this afternoon. See the treatment plan below. See the treatment plan below.  Associated Signs/Symptoms:  Depression Symptoms:  depressed mood, feelings of worthlessness/guilt, anxiety,  (Hypo) Manic Symptoms:  Hallucinations, Labiality of Mood,  Anxiety Symptoms:  Excessive Worry,  Psychotic Symptoms:  Hallucinations: Auditory  PTSD Symptoms: NA  Total Time spent with patient: 1.5 hours  Past Psychiatric History:  Schizophrenia.  Is the patient at risk to self? No.  Has the patient been a risk to self in the past 6 months? Yes.    Has the patient been a risk to self within the distant past? Yes.    Is the patient a risk to others? No.  Has the patient been a risk to others in the past 6 months? Yes.    Has the patient been a risk to others within the distant past? No.   [place] Scale:  Flowsheet Row [facility] Most recent reading at 07/10/2024  7:45 PM ED from 07/10/2024 in [facility] Most recent reading at 07/10/2024  1:03 PM Admission (Discharged) from 05/11/2024 in [facility] Most recent reading at 05/17/2024  8:30 PM  C-SSRS RISK CATEGORY High Risk High Risk Error: Q7 should not be populated when Q6 is No     Prior Inpatient Therapy: Yes.   If yes, describe: [facility] x numerous times.   Prior Outpatient Therapy: Yes.   If yes, describe: [facility].   Alcohol Screening: 1. How often do you have a drink containing alcohol?: 2 to 3 times a week 2. How many drinks containing alcohol do you have on a typical day when you are drinking?: 1 or 2 3. How often do you have six or more drinks on one occasion?: Never AUDIT-C Score: 3 4. How often during the last year have you found that you were not able to stop drinking once you had started?: Never 5. How often during the last year have you failed to do what was normally expected from you because of drinking?: Never 6. How often during the last year have you needed a first drink in the morning to get yourself going after a heavy drinking session?: Never 7. How often during the last year have you had a feeling of guilt of remorse after drinking?: Less than monthly 8. How often during the last year have you been unable to remember what happened the night before because you had been drinking?: Never 9. Have you or someone else been injured as a  result of your drinking?: No 10. Has a relative or friend or a doctor or another health worker been concerned about your drinking or suggested you cut down?: No Alcohol Use Disorder Identification Test Final Score (AUDIT): 4 Alcohol Brief Interventions/Follow-up: Alcohol education/Brief advice  Substance Abuse History in the last 12 months:  No.  Consequences of Substance Abuse: NA  Previous Psychotropic Medications: Yes   Psychological Evaluations: Yes   Past Medical History:  Past Medical History:  Diagnosis Date   Depression    Hypertension    Paranoid schizophrenia (HCC)    Schizophrenia (HCC)    History reviewed. No pertinent surgical history. Family History:  Family History  Problem Relation Age of Onset   Colon cancer Mother    Hyperlipidemia Father    Heart failure Brother    Diabetes Other    Stroke Other    Family Psychiatric  History: Schizophrenia: Father.  Schizophrenia: Brother.  Schizophrenia: Sister (deceased).  Tobacco Screening:  Social History   Tobacco Use  Smoking Status Every Day   Types: Cigarettes  Smokeless Tobacco Never    BH Tobacco Counseling     Are you interested in Tobacco Cessation Medications?  No, patient refused Counseled patient on smoking cessation:  Refused/Declined practical counseling Reason Tobacco Screening Not Completed: Patient Refused Screening       Social History: Patient reports is engaged, has 4 children, currently on disability, lives in [place], [place]. Social History   Substance and Sexual Activity  Alcohol Use Yes   Alcohol/week: 1.0 standard drink of alcohol   Types: 1 Cans of beer per week   Comment: one can of beer weekly     Social History   Substance and Sexual Activity  Drug Use Not Currently   Types: Marijuana    Additional Social History:  Allergies:   Allergies  Allergen Reactions   Spinach Hives   Zofran  [Ondansetron ] Rash   Lab Results:  Results for orders placed or performed  during the hospital encounter of 07/10/24 (from the past 48 hours)  Comprehensive metabolic panel     Status: Abnormal   Collection Time: 07/11/24  6:34 AM  Result Value Ref Range   Sodium 137 135 - 145 mmol/L   Potassium 3.8 3.5 - 5.1 mmol/L   Chloride 104 98 - 111 mmol/L   CO2 19 (L) 22 - 32 mmol/L   Glucose, Bld 169 (H) 70 - 99 mg/dL    Comment: Glucose reference range applies only to samples taken after fasting for at least 8 hours.   BUN 11 6 - 20 mg/dL   Creatinine, Ser 8.83 0.61 - 1.24 mg/dL   Calcium 9.4 8.9 - 89.6 mg/dL   Total Protein 6.9 6.5 - 8.1 g/dL   Albumin 4.2 3.5 - 5.0 g/dL   AST 25 15 - 41 U/L   ALT 22 0 - 44 U/L   Alkaline Phosphatase 49 38 - 126 U/L   Total Bilirubin 0.4 0.0 - 1.2 mg/dL   GFR, Estimated >39 >39 mL/min    Comment: (NOTE) Calculated using the CKD-EPI Creatinine Equation (2021)    Anion gap 14 5 - 15    Comment: Performed at [facility], [address], [place], [place] [address]   Blood Alcohol level:  Lab Results  Component Value Date   [facility] <15 07/10/2024   [facility] <15 05/10/2024   Metabolic Disorder Labs:  Lab Results  Component Value Date   HGBA1C 5.6 07/10/2024   MPG 114.02 07/10/2024   MPG 111.15 05/10/2024   Lab Results  Component Value Date   PROLACTIN 4.6 05/10/2024   PROLACTIN 55.0 (H) 10/13/2017   Lab Results  Component Value Date   CHOL 233 (H) 07/10/2024   TRIG 151 (H) 07/10/2024   HDL 101 07/10/2024   CHOLHDL 2.3 07/10/2024   VLDL 30 07/10/2024   LDLCALC 102 (H) 07/10/2024   LDLCALC 107 (H) 05/10/2024   Current Medications: Current Facility-Administered Medications  Medication Dose Route Frequency Provider Last Rate Last Admin   acetaminophen  (TYLENOL ) tablet 650 mg  650 mg Oral Q6H PRN Arloa Suzen RAMAN, NP   650 mg at 07/10/24 2139   alum & mag hydroxide-simeth (MAALOX/MYLANTA) 200-200-20 MG/5ML suspension 30 mL  30 mL Oral Q4H PRN Arloa Suzen RAMAN, NP       amLODipine  (NORVASC ) tablet 10  mg  10 mg Oral Daily Onuoha, Chinwendu V, NP   10 mg at 07/11/24 [id number]  benazepril  (LOTENSIN ) tablet 10 mg  10 mg Oral Daily Arloa Suzen RAMAN, NP   10 mg at 07/11/24 9166   benztropine  (COGENTIN ) tablet 1 mg  1 mg Oral BID Arloa Suzen RAMAN, NP   1 mg at 07/11/24 9166   haloperidol  (HALDOL ) tablet 5 mg  5 mg Oral TID PRN Arloa Suzen RAMAN, NP       And   diphenhydrAMINE  (BENADRYL ) capsule 50 mg  50 mg Oral TID PRN Arloa Suzen RAMAN, NP       haloperidol  lactate (HALDOL ) injection 5 mg  5 mg Intramuscular TID PRN Arloa Suzen RAMAN, NP       And   diphenhydrAMINE  (BENADRYL ) injection 50 mg  50 mg Intramuscular TID PRN Arloa Suzen RAMAN, NP       And   LORazepam  (ATIVAN ) injection 2 mg  2 mg Intramuscular TID PRN Arloa Suzen RAMAN, NP       haloperidol  lactate (HALDOL ) injection 10 mg  10 mg Intramuscular TID PRN Arloa Suzen RAMAN, NP       And   diphenhydrAMINE  (BENADRYL ) injection 50 mg  50 mg Intramuscular TID PRN Arloa Suzen RAMAN, NP       And   LORazepam  (ATIVAN ) injection 2 mg  2 mg Intramuscular TID PRN Arloa Suzen RAMAN, NP       hydrOXYzine  (ATARAX ) tablet 25 mg  25 mg Oral TID PRN Arloa Suzen RAMAN, NP   25 mg at 07/10/24 2139   magnesium  hydroxide (MILK OF MAGNESIA) suspension 30 mL  30 mL Oral Daily PRN Arloa Suzen RAMAN, NP       nicotine  (NICODERM CQ  - dosed in mg/24 hours) patch 21 mg  21 mg Transdermal Daily PRN Arloa Suzen RAMAN, NP   21 mg at 07/11/24 1247   risperiDONE  (RISPERDAL  M-TABS) disintegrating tablet 1.5 mg  1.5 mg Oral BID Arloa Suzen RAMAN, NP   1.5 mg at 07/11/24 9166   traZODone  (DESYREL ) tablet 50 mg  50 mg Oral QHS Arloa Suzen RAMAN, NP   50 mg at 07/10/24 2139   PTA Medications: Medications Prior to Admission  Medication Sig Dispense Refill Last Dose/Taking   acetaminophen  (TYLENOL ) 500 MG tablet Take 500 mg by mouth every 6 (six) hours as needed for mild pain (pain score 1-3) or moderate pain (pain score 4-6).      amLODipine  (NORVASC ) 10 MG  tablet Take 1 tablet (10 mg total) by mouth daily. 14 tablet 0    benazepril  (LOTENSIN ) 10 MG tablet Take 1 tablet (10 mg total) by mouth daily. 14 tablet 0    benztropine  (COGENTIN ) 1 MG tablet Take 1 tablet (1 mg total) by mouth 2 (two) times daily. 60 tablet 0    Multiple Vitamin (MULTIVITAMIN) tablet Take 1 tablet by mouth daily.      nicotine  (NICODERM CQ  - DOSED IN MG/24 HOURS) 21 mg/24hr patch Place 1 patch (21 mg total) onto the skin daily. 14 patch 0    Nutritional Supplements (ENSURE ORIGINAL) LIQD Take 1 Bottle by mouth 3 (three) times daily.      risperiDONE  (RISPERDAL ) 0.5 MG tablet Take 3 tablets (1.5 mg total) by mouth 2 (two) times daily. 180 tablet 0    traZODone  (DESYREL ) 50 MG tablet Take 1 tablet (50 mg total) by mouth at bedtime as needed for sleep. 30 tablet 0    AIMS:  ,  ,  ,  ,  ,  ,    Musculoskeletal: Strength & Muscle Tone: within  normal limits Gait & Station: normal Patient leans: N/A  Psychiatric Specialty Exam:  Presentation  General Appearance:  Casual; Fairly Groomed  Eye Contact: Fair  Speech: Clear and Coherent; Normal Rate  Speech Volume: Normal  Handedness: Right  Mood and Affect  Mood: Anxious; Depressed (Rates both depression & anxiety #5.)  Affect: Congruent  Thought Process  Thought Processes: Coherent; Goal Directed; Linear  Duration of Psychotic Symptoms:Greater than 4 days.  Past Diagnosis of Schizophrenia or Psychoactive disorder: Yes  Descriptions of Associations:Intact  Orientation:Full (Time, Place and Person)  Thought Content:Logical  Hallucinations:Hallucinations: None Description of Auditory Hallucinations: Denies hearing any voices at this time.  Ideas of Reference:None  Suicidal Thoughts:Suicidal Thoughts: No SI Active Intent and/or Plan: With Plan  Homicidal Thoughts:Homicidal Thoughts: No   Sensorium  Memory: Immediate Good; Recent Good; Remote  Good  Judgment: Fair  Insight: Fair   Art therapist  Concentration: Good  Attention Span: Good  Recall: Good  Fund of Knowledge: Good  Language: Good   Psychomotor Activity  Psychomotor Activity: Psychomotor Activity: Normal   Assets  Assets: Communication Skills; Desire for Improvement; Financial Resources/Insurance; Housing; Resilience; Social Support   Sleep  Sleep: Sleep: Good  Estimated Sleeping Duration (Last 24 Hours): 7.50 hours   Physical Exam: Physical Exam Vitals and nursing note reviewed.  HENT:     Head: Normocephalic.     Nose: Nose normal.     Mouth/Throat:     Pharynx: Oropharynx is clear.  Cardiovascular:     Comments: Bother the blood pressure & pulse rate are elevated.  B/p: 136/96. Patient is on amlodipine .   Pulse rate: 103.  Patient is currently in no apparent distress. Pulmonary:     Effort: Pulmonary effort is normal.  Genitourinary:    Comments: Deferred Musculoskeletal:        General: Normal range of motion.     Cervical back: Normal range of motion.  Skin:    General: Skin is dry.  Neurological:     General: No focal deficit present.     Mental Status: He is alert and oriented to person, place, and time.    Review of Systems  Constitutional:  Negative for chills, diaphoresis and fever.  HENT:  Negative for congestion and sore throat.   Eyes:  Negative for blurred vision.  Respiratory:  Negative for cough, shortness of breath and wheezing.   Cardiovascular:  Negative for chest pain and palpitations.  Gastrointestinal:  Negative for abdominal pain, constipation, diarrhea, heartburn, nausea and vomiting.  Genitourinary:  Negative for dysuria.  Musculoskeletal:  Negative for joint pain and myalgias.  Skin:  Negative for itching and rash.  Neurological:  Negative for dizziness, tingling, tremors, sensory change, speech change, focal weakness, seizures, loss of consciousness, weakness and headaches.   Endo/Heme/Allergies:        Allergies: Zofran .  Other: Spinach.  Psychiatric/Behavioral:  Positive for depression. Negative for hallucinations (Hx of), memory loss, substance abuse and suicidal ideas (H xof). The patient is not nervous/anxious and does not have insomnia (On Trazodone .).    Blood pressure (!) 136/95, pulse (!) 103, temperature 97.8 F (36.6 C), temperature source Oral, resp. rate 16, height 5' 11 (1.803 m), weight 77.6 kg, SpO2 100%. Body mass index is 23.85 kg/m.  Treatment Plan Summary: Daily contact with patient to assess and evaluate symptoms and progress in treatment and Medication management.   Principal/active diagnoses. Paranoid schizophrenia.  Plan: The risks/benefits/side-effects/alternatives to the medications in use were discussed in detail with the  patient and time was given for patient's questions. The patient consents to medication trial.   -Risperdal  1.5 mg po for mood control.  -Hydroxyzine  25 mg po tid prn for anxiety.  -Trazodone  50 mg po Q hs for insomnia.  Agitation protocols.  Continue as recommended. See MAR.   Medical.  -Continue Amlodipine  10 mg po daily for HTN.  -Continue Benazepril  10 mg po daily l for HTN.  Other PRNS -Continue Tylenol  650 mg every 6 hours PRN for mild pain -Continue Maalox 30 ml Q 4 hrs PRN for indigestion -Continue MOM 30 ml po Q 6 hrs for constipation  Safety and Monitoring: Voluntary admission to inpatient psychiatric unit for safety, stabilization and treatment Daily contact with patient to assess and evaluate symptoms and progress in treatment Patient's case to be discussed in multi-disciplinary team meeting Observation Level : q15 minute checks Vital signs: q12 hours Precautions: Safety  Discharge Planning: Social work and case management to assist with discharge planning and identification of hospital follow-up needs prior to discharge Estimated LOS: 5-7 days Discharge Concerns: Need to establish a  safety plan; Medication compliance and effectiveness Discharge Goals: Return home with outpatient referrals for mental health follow-up including medication management/psychotherapy  Observation Level/Precautions:  15 minute checks  Laboratory:  Per ED. Current lba results reviewed.  Psychotherapy: Enrolled in the group sessions.  Medications: See MAR.   Consultations: As needed.  Discharge Concerns: Safety, mood stability.  Estimated LOS: 3-5 days.  Other: NA   Physician Treatment Plan for Primary Diagnosis: MDD (major depressive disorder), recurrent episode, severe (HCC)  Long Term Goal(s): Improvement in symptoms so as ready for discharge  Short Term Goals: Ability to identify changes in lifestyle to reduce recurrence of condition will improve, Ability to verbalize feelings will improve, Ability to disclose and discuss suicidal ideas, and Ability to demonstrate self-control will improve  Physician Treatment Plan for Secondary Diagnosis: Principal Problem:   MDD (major depressive disorder), recurrent episode, severe (HCC)  Long Term Goal(s): Improvement in symptoms so as ready for discharge  Short Term Goals: Ability to identify and develop effective coping behaviors will improve, Ability to maintain clinical measurements within normal limits will improve, Compliance with prescribed medications will improve, and Ability to identify triggers associated with substance abuse/mental health issues will improve  I certify that inpatient services furnished can reasonably be expected to improve the patient's condition.    Mac Bolster, NP, pmhnp, fnp-bc. 8/27/20251:17 PM

## 2024-07-11 NOTE — Tx Team (Addendum)
 Initial Treatment Plan [name] FMW:[id number]    PATIENT STRESSORS: Financial difficulties   Marital or family conflict   Medication change or noncompliance   Traumatic event     PATIENT STRENGTHS: Active sense of humor  Average or above average intelligence  Capable of independent living  Communication skills  General fund of knowledge  Motivation for treatment/growth  Supportive family/friends    PATIENT IDENTIFIED PROBLEMS: SI  Depression  HI  AVH  I want to get my anxiety and stress under control.             DISCHARGE CRITERIA:  Ability to meet basic life and health needs Improved stabilization in mood, thinking, and/or behavior Motivation to continue treatment in a less acute level of care Need for constant or close observation no longer present Reduction of life-threatening or endangering symptoms to within safe limits Verbal commitment to aftercare and medication compliance  PRELIMINARY DISCHARGE PLAN: Outpatient therapy Participate in family therapy Return to previous living arrangement  PATIENT/FAMILY INVOLVEMENT: This treatment plan has been presented to and reviewed with the patient, Damon [name]. The patient has been given the opportunity to ask questions and make suggestions.  Charolet Maxcy, RN

## 2024-07-12 LAB — HIV ANTIBODY (ROUTINE TESTING W REFLEX): HIV Screen 4th Generation wRfx: NONREACTIVE

## 2024-07-12 LAB — RPR: RPR Ser Ql: NONREACTIVE

## 2024-07-12 MED ORDER — CLONIDINE HCL 0.1 MG PO TABS
0.2000 mg | ORAL_TABLET | Freq: Once | ORAL | Status: AC
  Administered 2024-07-12: 0.2 mg via ORAL
  Filled 2024-07-12: qty 2

## 2024-07-12 MED ORDER — VITAMIN D (ERGOCALCIFEROL) 1.25 MG (50000 UNIT) PO CAPS
50000.0000 [IU] | ORAL_CAPSULE | ORAL | Status: DC
  Administered 2024-07-12: 50000 [IU] via ORAL
  Filled 2024-07-12: qty 1

## 2024-07-12 NOTE — [facility] Suicide Risk Assessment (Signed)
 [facility] INPATIENT:  Family/Significant Other Suicide Prevention Education  Suicide Prevention Education:  Education Completed; Shontez Sermon (wife) [phone],  (name of family member/significant other) has been identified by the patient as the family member/significant other with whom the patient will be residing, and identified as the person(s) who will aid the patient in the event of a mental health crisis (suicidal ideations/suicide attempt).  With written consent from the patient, the family member/significant other has been provided the following suicide prevention education, prior to the and/or following the discharge of the patient.  The suicide prevention education provided includes the following: Suicide risk factors Suicide prevention and interventions National Suicide Hotline telephone number [facility] assessment telephone number [facility] Emergency Assistance 911 [facility] and/or Residential Mobile Crisis Unit telephone number  Request made of family/significant other to: Remove weapons (e.g., guns, rifles, knives), all items previously/currently identified as safety concern.   Remove drugs/medications (over-the-counter, prescriptions, illicit drugs), all items previously/currently identified as a safety concern.  [name] denies any presence of weapons in patient's home or her home and states she is willing to safely store all drugs/medications away from patient access. She states I would do anything to help my husband. Richerd is also aware of who to contact in the event that a mental health crisis were to occur.  The family member/significant other verbalizes understanding of the suicide prevention education information provided.  The family member/significant other agrees to remove the items of safety concern listed above.  Damon [name]  Damon [name] 07/12/2024, 1:20 PM

## 2024-07-12 NOTE — Group Note (Signed)
 Date:  07/12/2024 Time:  4:37 PM  Group Topic/Focus:  Medication side effect    Participation Level:  Active  Participation Quality:  Appropriate and Attentive  Affect:  Appropriate  Cognitive:  Alert and Appropriate  Insight: Appropriate and Good  Engagement in Group:  Engaged  Modes of Intervention:  Discussion and Education    Almarie MALVA Lowers 07/12/2024, 4:37 PM

## 2024-07-12 NOTE — Progress Notes (Signed)
 Patient   07/11/24 2200  Psych Admission Type (Psych Patients Only)  Admission Status Voluntary  Psychosocial Assessment  Patient Complaints Anxiety  Eye Contact Brief  Facial Expression Animated  Affect Depressed  Speech Logical/coherent  Interaction Minimal  Motor Activity Fidgety  Appearance/Hygiene In scrubs  Behavior Characteristics Cooperative  Mood Pleasant  Thought Process  Coherency WDL  Content WDL  Delusions None reported or observed  Perception WDL  Hallucination None reported or observed  Judgment Impaired  Confusion None  Danger to Self  Current suicidal ideation? Denies  Danger to Others  Danger to Others None reported or observed  Danger to Others Abnormal  Harmful Behavior to others Threats of violence towards other people observed or expressed   Destructive Behavior No threats or harm toward property

## 2024-07-12 NOTE — Plan of Care (Signed)
   Problem: Education: Goal: Knowledge of [facility] Education information/materials will improve Outcome: Progressing Goal: Emotional status will improve Outcome: Progressing Goal: Mental status will improve Outcome: Progressing Goal: Verbalization of understanding the information provided will improve Outcome: Progressing

## 2024-07-12 NOTE — Group Note (Signed)
 LCSW Group Therapy Note   Group Date: 07/12/2024 Start Time: 1100 End Time: 1200   Participation:  patient was present.  He listened and was respectful but didn't participate in the discussion.  Type of Therapy:  Group Therapy  Topic:  Healing From Within: Understanding Our Past, Building Our Future"  Objective:  To help participants understand the impact of early experiences on mental and physical health, with a focus on Adverse Childhood Experiences (ACEs), and to explore ways to build resilience and healing.  Group Goals: Understand ACEs and Their Impact: Learn how childhood experiences shape mental and physical health. Build Resilience: Develop strategies for overcoming challenges and creating positive change. Promote Healing: Recognize the value of support and the possibility of healing through therapy and self-care.  Summary: In today's session, we discussed how early experiences, especially ACEs, impact mental and physical health. We explored the effects of stress, abuse, and neglect on brain development and well-being. The group focused on resilience, understanding that healing and positive change are possible with support and self-awareness.  Therapeutic Modalities Used: Psychoeducation: Sharing information about ACEs and their effects. Cognitive Behavioral Therapy (CBT): Helping reframe negative thought patterns. Trauma-Informed Therapy: Creating a safe, supportive space for healing.   Matrice Herro O Jonea Bukowski, LCSWA 07/12/2024  6:29 PM

## 2024-07-12 NOTE — BH Assessment (Signed)
.(  Sleep Hours) -7.5 (Any PRNs that were needed, meds refused, or side effects to meds)- no (Any disturbances and when (visitation, over night)- (Concerns raised by the patient)- no (SI/HI/AVH)-denies

## 2024-07-12 NOTE — Progress Notes (Signed)
 D: Patient is alert, oriented, pleasant, and cooperative. Patient endorses AH. Denies SI, HI, VH, and verbally contracts for safety. Patient reports he slept good last night without sleeping medication. Patient reports his appetite as good and energy level as normal. Patient rates his depression 0/10 and anxiety 1/10. Patient denies physical symptoms/pain. Patient blood pressure is elevated.   A: Scheduled medications administered per MD order. PRN nicotine  patch administered. Support provided. Patient educated on safety on the unit and medications. Routine safety checks every 15 minutes. Patient stated understanding to tell nurse about any new physical symptoms. Patient understands to tell staff of any needs.     R: No adverse drug reactions noted. Patient remains safe at this time and will continue to monitor.    07/12/24 0900  Psych Admission Type (Psych Patients Only)  Admission Status Voluntary  Psychosocial Assessment  Patient Complaints Anxiety  Eye Contact Brief  Facial Expression Sad  Affect Depressed  Speech Logical/coherent  Interaction Minimal  Motor Activity Other (Comment) (WNL)  Appearance/Hygiene Unremarkable  Behavior Characteristics Cooperative  Mood Anxious;Depressed  Thought Process  Coherency WDL  Content WDL  Delusions None reported or observed  Perception Hallucinations  Hallucination Auditory  Judgment Impaired  Confusion None  Danger to Self  Current suicidal ideation? Denies  Self-Injurious Behavior No self-injurious ideation or behavior indicators observed or expressed   Agreement Not to Harm Self Yes  Description of Agreement verbal  Danger to Others  Danger to Others None reported or observed  Danger to Others Abnormal  Harmful Behavior to others No threats or harm toward other people  Destructive Behavior No threats or harm toward property

## 2024-07-12 NOTE — Group Note (Signed)
 Date:  07/12/2024 Time:  9:18 PM  Group Topic/Focus:  Wrap-Up Group:   The focus of this group is to help patients review their daily goal of treatment and discuss progress on daily workbooks.    Participation Level:  Active  Participation Quality:  Appropriate and Attentive  Affect:  Appropriate  Cognitive:  Alert and Appropriate  Insight: Appropriate and Good  Engagement in Group:  Engaged  Modes of Intervention:  Discussion and Education  Additional Comments:  Pt attended and participated in wrap up group this evening and rated their day an 8/10. Pt stated that they have been able to make it to groups and have enjoyed interacting with their peers. Pt completed their goal, which was to have their medications managed. Pt is anticipating being D/C soon. Pt has no concerns to relay at this time.   Rosella DELENA Pouch 07/12/2024, 9:18 PM

## 2024-07-12 NOTE — Progress Notes (Signed)
 [facility] MD Progress Note  07/12/2024 4:45 PM Damon [name]  MRN:  [id number]  Reason for admission: 58 year old African-American male with prior history of paranoid schizophrenia.  Patient has been in this [facility] numerous times for issues related to mood instability/psychosis. Admitted in this [facility] this time around with complaints of worsening depression triggering suicidal ideations suicidal ideations. 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 Chart review reports indicated that this patient has hx of suicidal ideations with plan to stab himself. Also, he has hx of noncompliance to his recommended treatment regimen. Patient chart is reviewed including current lab results. Patient recently admitted & treated at the [facility] behavioral unit in June of this year, 2025 with similar complaints. He was discharged after 7 days with an outpatient psychiatric recommendation for medication management.   Daily notes: Damon [name] is seen. Chart reviewed. The chart findings discussed with the treatment team. He presents alert, oriented & aware of situation. He is visible on the unit, attending group sessions. He presents with a good affect, good eye contact & verbally responsive. He reports, My mood is starting to improve for the better. I slept a whole lot better last night. I'm doing well on my medicines. The only side effects I'm feeling is just dry mouth. I have been going to the group sessions. I think I'm getting better & ready for discharge. Damon [name] currently denies any SIHI, AVH, delusional thoughts or paranoia. He does not appear to be responding to any internal stimuli. Patient's blood pressure remains elevated, although on two different type of high blood pressure medicines. Will give a dose of clonidine  0.2 mg once this evening to help lower the blood so the two antihypertension continue to be  used for maintenance therapy. There are no other changes made on the current plan of care.  Principal Problem: MDD (major depressive disorder), recurrent episode, severe (HCC) Diagnosis: Principal Problem:   MDD (major depressive disorder), recurrent episode, severe (HCC)  Total Time spent with patient: 45 minutes  Past Psychiatric History: Schizophrenia.  Past Medical History:  Past Medical History:  Diagnosis Date   Depression    Hypertension    Paranoid schizophrenia (HCC)    Schizophrenia (HCC)    History reviewed. No pertinent surgical history.  Family History:  Family History  Problem Relation Age of Onset   Colon cancer Mother    Hyperlipidemia Father    Heart failure Brother    Diabetes Other    Stroke Other    Family Psychiatric  History: See H&P.  Social History:  Social History   Substance and Sexual Activity  Alcohol Use Yes   Alcohol/week: 1.0 standard drink of alcohol   Types: 1 Cans of beer per week   Comment: one can of beer weekly     Social History   Substance and Sexual Activity  Drug Use Not Currently   Types: Marijuana    Social History   Socioeconomic History   Marital status: Single    Spouse name: Not on file   Number of children: Not on file   Years of education: Not on file   Highest education level: Not on file  Occupational History   Not on file  Tobacco Use   Smoking status: Every Day    Types: Cigarettes   Smokeless tobacco: Never  Substance and Sexual Activity   Alcohol use: Yes    Alcohol/week:  1.0 standard drink of alcohol    Types: 1 Cans of beer per week    Comment: one can of beer weekly   Drug use: Not Currently    Types: Marijuana   Sexual activity: Yes    Birth control/protection: Condom  Other Topics Concern   Not on file  Social History Narrative   ** Merged History Encounter **       Social Drivers of Health   Financial Resource Strain: Low Risk  (01/13/2023)   Overall Financial Resource Strain  (CARDIA)    Difficulty of Paying Living Expenses: Not hard at all  Food Insecurity: Food Insecurity Present (07/10/2024)   Hunger Vital Sign    Worried About Running Out of Food in the Last Year: Sometimes true    Ran Out of Food in the Last Year: Never true  Transportation Needs: No Transportation Needs (07/10/2024)   PRAPARE - Administrator, Civil Service (Medical): No    Lack of Transportation (Non-Medical): No  Physical Activity: Sufficiently Active (01/13/2023)   Exercise Vital Sign    Days of Exercise per Week: 5 days    Minutes of Exercise per Session: 60 min  Stress: Stress Concern Present (01/13/2023)   [employer] of Occupational Health - Occupational Stress Questionnaire    Feeling of Stress : To some extent  Social Connections: Socially Isolated (05/11/2024)   Social Connection and Isolation Panel    Frequency of Communication with Friends and Family: Twice a week    Frequency of Social Gatherings with Friends and Family: Once a week    Attends Religious Services: Never    Database administrator or Organizations: No    Attends Engineer, structural: Never    Marital Status: Never married   Additional Social History:   Sleep: Good Estimated Sleeping Duration (Last 24 Hours): 7.50 hours  Appetite:  Good  Current Medications: Current Facility-Administered Medications  Medication Dose Route Frequency Provider Last Rate Last Admin   acetaminophen  (TYLENOL ) tablet 650 mg  650 mg Oral Q6H PRN Arloa Suzen RAMAN, NP   650 mg at 07/10/24 2139   alum & mag hydroxide-simeth (MAALOX/MYLANTA) 200-200-20 MG/5ML suspension 30 mL  30 mL Oral Q4H PRN Arloa Suzen RAMAN, NP       amLODipine  (NORVASC ) tablet 10 mg  10 mg Oral Daily Onuoha, Chinwendu V, NP   10 mg at 07/12/24 9361   benazepril  (LOTENSIN ) tablet 10 mg  10 mg Oral Daily Arloa Suzen RAMAN, NP   10 mg at 07/12/24 9096   benztropine  (COGENTIN ) tablet 1 mg  1 mg Oral BID Arloa Suzen RAMAN, NP   1 mg  at 07/12/24 9095   haloperidol  (HALDOL ) tablet 5 mg  5 mg Oral TID PRN Arloa Suzen RAMAN, NP       And   diphenhydrAMINE  (BENADRYL ) capsule 50 mg  50 mg Oral TID PRN Arloa Suzen RAMAN, NP       haloperidol  lactate (HALDOL ) injection 5 mg  5 mg Intramuscular TID PRN Arloa Suzen RAMAN, NP       And   diphenhydrAMINE  (BENADRYL ) injection 50 mg  50 mg Intramuscular TID PRN Arloa Suzen RAMAN, NP       And   LORazepam  (ATIVAN ) injection 2 mg  2 mg Intramuscular TID PRN Arloa Suzen RAMAN, NP       haloperidol  lactate (HALDOL ) injection 10 mg  10 mg Intramuscular TID PRN Arloa Suzen RAMAN, NP  And   diphenhydrAMINE  (BENADRYL ) injection 50 mg  50 mg Intramuscular TID PRN Arloa Suzen RAMAN, NP       And   LORazepam  (ATIVAN ) injection 2 mg  2 mg Intramuscular TID PRN Arloa Suzen RAMAN, NP       hydrOXYzine  (ATARAX ) tablet 25 mg  25 mg Oral TID PRN Arloa Suzen RAMAN, NP   25 mg at 07/10/24 2139   magnesium  hydroxide (MILK OF MAGNESIA) suspension 30 mL  30 mL Oral Daily PRN Arloa Suzen RAMAN, NP       nicotine  (NICODERM CQ  - dosed in mg/24 hours) patch 21 mg  21 mg Transdermal Daily PRN Arloa Suzen RAMAN, NP   21 mg at 07/12/24 9094   risperiDONE  (RISPERDAL  M-TABS) disintegrating tablet 1.5 mg  1.5 mg Oral BID Arloa Suzen RAMAN, NP   1.5 mg at 07/12/24 9096   traZODone  (DESYREL ) tablet 50 mg  50 mg Oral QHS Arloa Suzen RAMAN, NP   50 mg at 07/11/24 2109   Vitamin D  (Ergocalciferol ) (DRISDOL ) 1.25 MG (50000 UNIT) capsule 50,000 Units  50,000 Units Oral Q7 days Parker, Alvin S, MD   50,000 Units at 07/12/24 1250    Lab Results:  Results for orders placed or performed during the hospital encounter of 07/10/24 (from the past 48 hours)  Comprehensive metabolic panel     Status: Abnormal   Collection Time: 07/11/24  6:34 AM  Result Value Ref Range   Sodium 137 135 - 145 mmol/L   Potassium 3.8 3.5 - 5.1 mmol/L   Chloride 104 98 - 111 mmol/L   CO2 19 (L) 22 - 32 mmol/L   Glucose, Bld 169  (H) 70 - 99 mg/dL    Comment: Glucose reference range applies only to samples taken after fasting for at least 8 hours.   BUN 11 6 - 20 mg/dL   Creatinine, Ser 8.83 0.61 - 1.24 mg/dL   Calcium 9.4 8.9 - 89.6 mg/dL   Total Protein 6.9 6.5 - 8.1 g/dL   Albumin 4.2 3.5 - 5.0 g/dL   AST 25 15 - 41 U/L   ALT 22 0 - 44 U/L   Alkaline Phosphatase 49 38 - 126 U/L   Total Bilirubin 0.4 0.0 - 1.2 mg/dL   GFR, Estimated >39 >39 mL/min    Comment: (NOTE) Calculated using the CKD-EPI Creatinine Equation (2021)    Anion gap 14 5 - 15    Comment: Performed at [facility], [address], [place], [place] [address]  Folate     Status: None   Collection Time: 07/11/24  6:27 PM  Result Value Ref Range   Folate 14.6 >5.9 ng/mL    Comment: Performed at [facility], [address] [place], [place] [address]  RPR     Status: None   Collection Time: 07/11/24  6:27 PM  Result Value Ref Range   RPR Ser Ql NON REACTIVE NON REACTIVE    Comment: Performed at [facility], [address], [place], [place] [address]  Vitamin B12     Status: None   Collection Time: 07/11/24  6:27 PM  Result Value Ref Range   Vitamin B-12 528 180 - 914 pg/mL    Comment: Performed at [facility], [address] [place], [place] [address]  VITAMIN D  25 Hydroxy (Vit-D Deficiency, Fractures)     Status: Abnormal   Collection Time: 07/11/24  6:27 PM  Result Value Ref Range  Vit D, 25-Hydroxy 29.45 (L) 30 - 100 ng/mL    Comment: (NOTE) Vitamin D  deficiency has been defined by the Institute of Medicine  and an Endocrine Society practice guideline as a level of serum 25-OH  vitamin D  less than 20 ng/mL (1,2). The Endocrine Society went on to  further define vitamin D  insufficiency as a level between 21 and 29  ng/mL (2).  1. IOM (Institute of Medicine). 2010. Dietary reference intakes for  calcium and D. [place]: [employer]. 2. Holick  MF, Binkley [place], Bischoff-Ferrari HA, et al. Evaluation,  treatment, and prevention of vitamin D  deficiency: an Endocrine  Society clinical practice guideline, JCEM. 2011 Jul; 96(7): 1911-30.  Performed at [facility], [address], [place], [place] [address]   HIV Antibody (routine testing w rflx)     Status: None   Collection Time: 07/11/24  6:27 PM  Result Value Ref Range   HIV Screen 4th Generation wRfx Non Reactive Non Reactive    Comment: Performed at [facility], [address] [place], [place] [address]    Blood Alcohol level:  Lab Results  Component Value Date   [facility] <15 07/10/2024   ETH <15 05/10/2024    Metabolic Disorder Labs: Lab Results  Component Value Date   HGBA1C 5.6 07/10/2024   MPG 114.02 07/10/2024   MPG 111.15 05/10/2024   Lab Results  Component Value Date   PROLACTIN 4.6 05/10/2024   PROLACTIN 55.0 (H) 10/13/2017   Lab Results  Component Value Date   CHOL 233 (H) 07/10/2024   TRIG 151 (H) 07/10/2024   HDL 101 07/10/2024   CHOLHDL 2.3 07/10/2024   VLDL 30 07/10/2024   LDLCALC 102 (H) 07/10/2024   LDLCALC 107 (H) 05/10/2024    Physical Findings: AIMS:  ,  ,  ,  ,  ,  ,   CIWA:    COWS:     Musculoskeletal: Strength & Muscle Tone: within normal limits Gait & Station: normal Patient leans: N/A  Psychiatric Specialty Exam:  Presentation  General Appearance:  Casual; Fairly Groomed  Eye Contact: Fair  Speech: Clear and Coherent; Normal Rate  Speech Volume: Normal  Handedness: Right   Mood and Affect  Mood: Anxious; Depressed (Rates both depression & anxiety #5.)  Affect: Congruent   Thought Process  Thought Processes: Coherent; Goal Directed; Linear  Descriptions of Associations:Intact  Orientation:Full (Time, Place and Person)  Thought Content:Logical  History of Schizophrenia/Schizoaffective disorder:Yes  Duration of Psychotic Symptoms:Greater than six  months  Hallucinations:Hallucinations: None Description of Auditory Hallucinations: Denies hearing any voices at this time.  Ideas of Reference:None  Suicidal Thoughts:Suicidal Thoughts: No  Homicidal Thoughts:Homicidal Thoughts: No   Sensorium  Memory: Immediate Good; Recent Good; Remote Good  Judgment: Fair  Insight: Fair   Art therapist  Concentration: Good  Attention Span: Good  Recall: Good  Fund of Knowledge: Good  Language: Good   Psychomotor Activity  Psychomotor Activity: Psychomotor Activity: Normal   Assets  Assets: Communication Skills; Desire for Improvement; Financial Resources/Insurance; Housing; Resilience; Social Support  Sleep  Sleep: Sleep: Good Number of Hours of Sleep: 7.5  Physical Exam: Physical Exam HENT:     Head: Normocephalic.     Nose: Nose normal.     Mouth/Throat:     Pharynx: Oropharynx is clear.  Cardiovascular:     Pulses: Normal pulses.  Pulmonary:     Effort: Pulmonary effort is normal.  Genitourinary:    Comments: Deferred.  Musculoskeletal:        General: Normal range of motion.     Cervical back: Normal range of motion.  Skin:    General: Skin is dry.  Neurological:     General: No focal deficit present.     Mental Status: He is alert and oriented to person, place, and time.    Review of Systems  Constitutional:  Negative for chills, diaphoresis and fever.  HENT:  Negative for congestion and sore throat.   Respiratory:  Negative for cough, shortness of breath and wheezing.   Cardiovascular:  Negative for chest pain and palpitations.  Gastrointestinal:  Negative for abdominal pain, constipation, diarrhea, heartburn, nausea and vomiting.  Genitourinary:  Negative for dysuria.  Musculoskeletal:  Negative for joint pain and myalgias.  Skin:  Negative for itching and rash.  Neurological:  Negative for dizziness, tingling, tremors, sensory change, speech change, focal weakness, seizures, loss  of consciousness, weakness and headaches.  Endo/Heme/Allergies:        Allergies: Zofran .  Other: Spinach.   Blood pressure (!) 145/92, pulse 93, temperature 98.1 F (36.7 C), temperature source Oral, resp. rate 20, height 5' 11 (1.803 m), weight 77.6 kg, SpO2 100%. Body mass index is 23.85 kg/m.  Treatment Plan Summary: Daily contact with patient to assess and evaluate symptoms and progress in treatment and Medication management.   Principal/active diagnoses. Paranoid schizophrenia.  Plan: The risks/benefits/side-effects/alternatives to the medications in use were discussed in detail with the patient and time was given for patient's questions. The patient consents to medication trial.    -Continue Risperdal  1.5 mg po for mood control.  -Continue Hydroxyzine  25 mg po tid prn for anxiety.  -Continue Trazodone  50 mg po Q hs for insomnia.   Agitation protocols.  Continue as recommended. See MAR.    Medical.  -Continue Amlodipine  10 mg po daily for HTN.  -Continue Benazepril  10 mg po daily l for HTN.  -Clonidine  0.2 mg po once for elevated blood pressure.   Other PRNS -Continue Tylenol  650 mg every 6 hours PRN for mild pain -Continue Maalox 30 ml Q 4 hrs PRN for indigestion -Continue MOM 30 ml po Q 6 hrs for constipation   Safety and Monitoring: Voluntary admission to inpatient psychiatric unit for safety, stabilization and treatment Daily contact with patient to assess and evaluate symptoms and progress in treatment Patient's case to be discussed in multi-disciplinary team meeting Observation Level : q15 minute checks Vital signs: q12 hours Precautions: Safety   Discharge Planning: Social work and case management to assist with discharge planning and identification of hospital follow-up needs prior to discharge Estimated LOS: 5-7 days Discharge Concerns: Need to establish a safety plan; Medication compliance and effectiveness Discharge Goals: Return home with outpatient  referrals for mental health follow-up including medication management/psychotherapy  Mac Bolster, NP, pmhnp, fnp-bc. 07/12/2024, 4:45 PM

## 2024-07-12 NOTE — Plan of Care (Signed)

## 2024-07-13 DIAGNOSIS — F200 Paranoid schizophrenia: Principal | ICD-10-CM

## 2024-07-13 DIAGNOSIS — F332 Major depressive disorder, recurrent severe without psychotic features: Secondary | ICD-10-CM

## 2024-07-13 MED ORDER — VITAMIN D (ERGOCALCIFEROL) 1.25 MG (50000 UNIT) PO CAPS: 50000.0000 [IU] | ORAL_CAPSULE | ORAL | 0 refills | Status: AC

## 2024-07-13 MED ORDER — TRAZODONE HCL 50 MG PO TABS: 50.0000 mg | ORAL_TABLET | Freq: Every day | ORAL | 0 refills | Status: DC

## 2024-07-13 MED ORDER — BENZTROPINE MESYLATE 1 MG PO TABS: 1.0000 mg | ORAL_TABLET | Freq: Two times a day (BID) | ORAL | 0 refills | Status: DC

## 2024-07-13 MED ORDER — RISPERIDONE 0.5 MG PO TABS: 1.5000 mg | ORAL_TABLET | Freq: Two times a day (BID) | ORAL | 0 refills | Status: DC

## 2024-07-13 MED ORDER — BENAZEPRIL HCL 10 MG PO TABS: 10.0000 mg | ORAL_TABLET | Freq: Every day | ORAL | 0 refills | Status: AC

## 2024-07-13 MED ORDER — CLONIDINE HCL 0.1 MG PO TABS
0.2000 mg | ORAL_TABLET | Freq: Once | ORAL | Status: AC
  Administered 2024-07-13: 0.2 mg via ORAL
  Filled 2024-07-13: qty 2

## 2024-07-13 MED ORDER — NICOTINE 21 MG/24HR TD PT24: 21.0000 mg | MEDICATED_PATCH | Freq: Every day | TRANSDERMAL | Status: DC | PRN

## 2024-07-13 MED ORDER — AMLODIPINE BESYLATE 10 MG PO TABS: 10.0000 mg | ORAL_TABLET | Freq: Every day | ORAL | 0 refills | Status: AC

## 2024-07-13 MED ORDER — HYDROXYZINE HCL 25 MG PO TABS: 25.0000 mg | ORAL_TABLET | Freq: Three times a day (TID) | ORAL | 0 refills | Status: DC | PRN

## 2024-07-13 NOTE — Progress Notes (Signed)
  [facility] Adult Case Management Discharge Plan :  Will you be returning to the same living situation after discharge:  Yes,  patient will be returning back to his home. At discharge, do you have transportation home?: Yes,  patient will be transported home by [employer] taxi through a voucher at 11:30 AM.  Do you have the ability to pay for your medications: Yes,  patient has active health insurance.  Release of information consent forms completed and in the chart;  Patient's signature needed at discharge.  Patient to Follow up at:  Follow-up Information     [facility] Follow up on 07/20/2024.   Why: You have a hospital follow up appointment for therapy and medication management services on 07/20/24 at 11:00 am  .  The appointment will be Virtual, telehealth. Contact information: [address]  [address] [place] [address] [phone]                 Next level of care provider has access to [facility] Link:no  Safety Planning and Suicide Prevention discussed: Yes,  completed with patient's wife, Leelynn Whetsel [phone].      Has patient been referred to the Quitline?: Yes, faxed/e-referral on 07/13/24 at 9:15 AM.   Patient has been referred for addiction treatment: No known substance use disorder.  Louetta Lame, LCSWA 07/13/2024, 9:12 AM

## 2024-07-13 NOTE — Progress Notes (Signed)
 Damon [name] D/C'd Home per MD order.  Discussed with the patient and all questions fully answered.  An After Visit Summary was printed and given to the patient. Patient received prescription.  D/c education completed with patient including follow up instructions, medication list, d/c activities limitations if indicated, with other d/c instructions as indicated by MD - patient able to verbalize understanding, all questions fully answered.   Patient instructed to return to ED, call 911, or call MD for any changes in condition.   Patient escorted to the main entrance, and D/C home via private auto.  Damon [name] Doing 07/13/2024 12:21 PM

## 2024-07-13 NOTE — Transportation (Signed)
 07/13/2024  Damon [name] DOB: 05/23/1966 MRN: [id number]   RIDER WAIVER AND RELEASE OF LIABILITY  For the purposes of helping with transportation needs, [facility] partners with outside transportation providers (taxi companies, [place], Catering manager.) to give [facility] patients or other approved people the choice of on-demand rides Public librarian) to our buildings for non-emergency visits.  By using [employer], I, the person signing this document, on behalf of myself and/or any legal minors (in my care using the [employer]), agree:  Science writer given to me are supplied by independent, outside transportation providers who do not work for, or have any affiliation with, [employer]. [place] is not a transportation company. [facility] has no control over the quality or safety of the rides I get using [employer]. [facility] has no control over whether any outside ride will happen on time or not. [facility] gives no guarantee on the reliability, quality, safety, or availability on any rides, or that no mistakes will happen. I know and accept that traveling by vehicle (car, truck, SVU, fleeta, bus, taxi, etc.) has risks of serious injuries such as disability, being paralyzed, and death. I know and agree the risk of using [employer] is mine alone, and not [employer]. [employer] are provided as is and as are available. The transportation providers are in charge for all inspections and care of the vehicles used to provide these rides. I agree not to take legal action against [facility], its agents, employees, officers, directors, representatives, insurers, attorneys, assigns, successors, subsidiaries, and affiliates at any time for any reasons related directly or indirectly to using [employer]. I also agree not to take legal action against [facility] or its affiliates for any injury, death, or damage to property caused by or related to using  [employer]. I have read this Waiver and Release of Liability, and I understand the terms used in it and their legal meaning. This Waiver is freely and voluntarily given with the understanding that my right (or any legal minors) to legal action against  relating to [employer] is knowingly given up to use these services.   I attest that I read the Ride Waiver and Release of Liability to Damon [name], gave Mr. Aken the opportunity to ask questions and answered the questions asked (if any). I affirm that Damon [name] then provided consent for assistance with transportation.

## 2024-07-13 NOTE — Plan of Care (Signed)
  Problem: Activity: Goal: Interest or engagement in activities will improve 07/13/2024 1000 by Cresenciano Joaquin KIDD, RN Outcome: Completed/Met 07/13/2024 0959 by Cresenciano Joaquin KIDD, RN Outcome: Progressing   Problem: Coping: Goal: Ability to verbalize frustrations and anger appropriately will improve 07/13/2024 1000 by Cresenciano Joaquin KIDD, RN Outcome: Completed/Met 07/13/2024 0959 by Cresenciano Joaquin KIDD, RN Outcome: Progressing   Problem: Safety: Goal: Periods of time without injury will increase 07/13/2024 1000 by Cresenciano Joaquin KIDD, RN Outcome: Completed/Met 07/13/2024 0959 by Cresenciano Joaquin KIDD, RN Outcome: Progressing

## 2024-07-13 NOTE — Group Note (Signed)
 Date:  07/13/2024 Time:  10:10 AM  Group Topic/Focus:  Goals Group:   The focus of this group is to help patients establish daily goals to achieve during treatment and discuss how the patient can incorporate goal setting into their daily lives to aide in recovery.    Participation Level:  Active  Participation Quality:  Appropriate  Affect:  Appropriate  Cognitive:  Appropriate  Insight: Appropriate  Engagement in Group:  Engaged  Modes of Intervention:  Discussion  Additional Comments:  N/A  Grey Rakestraw A Jyair Kiraly 07/13/2024, 10:10 AM

## 2024-07-13 NOTE — Plan of Care (Signed)
  Problem: Activity: Goal: Interest or engagement in activities will improve Outcome: Progressing   Problem: Coping: Goal: Ability to verbalize frustrations and anger appropriately will improve Outcome: Progressing   Problem: Physical Regulation: Goal: Ability to maintain clinical measurements within normal limits will improve Outcome: Progressing

## 2024-07-13 NOTE — Discharge Summary (Cosign Needed Addendum)
 Physician Discharge Summary Note  Patient:  Damon [name] is an 58 y.o., male  MRN:  [id number]  DOB:  [birth year]  Patient phone:  [phone] (home)   Patient address:   [address] [place] [address],    Date of Admission:  07/10/2024 Date of Discharge: 07-13-24.   Total time: 45 minutes.  Reason for Admission: Worsening depression triggering suicidal ideations suicidal ideations.    Principal Problem: Paranoid schizophrenia [facility])  Discharge Diagnoses: Principal Problem:   Paranoid schizophrenia (HCC) Active Problems:   MDD (major depressive disorder), recurrent episode, severe (HCC)  Psychiatric History Paranoid schizophrenia (primary diagnosis) Major depressive disorder with psychotic features (no current symptoms) Past suicide attempt: 4 years ago, attempted to stab self  Prior medication trials: Seroquel, Trazodone  No consistent outpatient mental health engagement documented   Social History:  Social History   Substance and Sexual Activity  Alcohol Use Yes   Alcohol/week: 1.0 standard drink of alcohol   Types: 1 Cans of beer per week   Comment: one can of beer weekly     Social History   Substance and Sexual Activity  Drug Use Not Currently   Types: Marijuana    Social History   Socioeconomic History   Marital status: Single    Spouse name: Not on file   Number of children: Not on file   Years of education: Not on file   Highest education level: Not on file  Occupational History   Not on file  Tobacco Use   Smoking status: Every Day    Types: Cigarettes   Smokeless tobacco: Never  Substance and Sexual Activity   Alcohol use: Yes    Alcohol/week: 1.0 standard drink of alcohol    Types: 1 Cans of beer per week    Comment: one can of beer weekly   Drug use: Not Currently    Types: Marijuana   Sexual activity: Yes    Birth control/protection: Condom  Other Topics Concern   Not on file  Social History Narrative   **  Merged History Encounter **       Social Drivers of Health   Financial Resource Strain: Low Risk  (01/13/2023)   Overall Financial Resource Strain (CARDIA)    Difficulty of Paying Living Expenses: Not hard at all  Food Insecurity: Food Insecurity Present (07/10/2024)   Hunger Vital Sign    Worried About Running Out of Food in the Last Year: Sometimes true    Ran Out of Food in the Last Year: Never true  Transportation Needs: No Transportation Needs (07/10/2024)   PRAPARE - Administrator, Civil Service (Medical): No    Lack of Transportation (Non-Medical): No  Physical Activity: Sufficiently Active (01/13/2023)   Exercise Vital Sign    Days of Exercise per Week: 5 days    Minutes of Exercise per Session: 60 min  Stress: Stress Concern Present (01/13/2023)   [employer] of Occupational Health - Occupational Stress Questionnaire    Feeling of Stress : To some extent  Social Connections: Socially Isolated (05/11/2024)   Social Connection and Isolation Panel    Frequency of Communication with Friends and Family: Twice a week    Frequency of Social Gatherings with Friends and Family: Once a week    Attends Religious Services: Never    Database administrator or Organizations: No    Attends Banker Meetings: Never    Marital Status: Never married   Past Medical  History:  Past Medical History:  Diagnosis Date   Depression    Hypertension    Paranoid schizophrenia (HCC)    Schizophrenia (HCC)    History reviewed. No pertinent surgical history. Family History:  Family History  Problem Relation Age of Onset   Colon cancer Mother    Hyperlipidemia Father    Heart failure Brother    Diabetes Other    Stroke Other    Hospital Course: (Per admission evaluation notes): 58 year old African-American male with prior history of paranoid schizophrenia. Patient has been in this [facility] numerous times for issues related to mood instability/psychosis. Admitted in this [facility]  this time around with complaints of worsening depression triggering suicidal ideations suicidal ideations. 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 Chart review reports indicated that this patient has hx of suicidal ideations with plan to stab himself. Also, he has hx of noncompliance to his recommended treatment regimen. Patient chart is reviewed including current lab results. Patient recently admitted & treated at the [facility] behavioral unit in June of this year, 2025 with similar complaints. He was discharged after 7 days with an outpatient psychiatric recommendation for medication management.    This is one of several psychiatric discharge summaries for this 58 year old male with hx of chronic mental illness & multiple psychiatric admissions. He is known in this [facility] for worsening symptoms of Schizophrenia, paranoia-type. He has been tried on multiple psychotropic medications for his symptoms & it appeared Damon [name] was not been compliant to his recommended treatment regimen. He was brought to the [facility] this time around for evaluation & treatment for worsening depression triggering suicidal ideations suicidal ideations.  After evaluation of his presenting symptoms, Damon [name] was recommended for mood stabilization treatments. The medication regimen for his presenting symptoms were discussed & with his consent initiated. He received, stabilized & was discharged on the medications as listed below on his discharge medication lists. He was also enrolled & participated in the group counseling sessions being offered & held on this unit. He learned coping skills. He presented on this admission, other chronic medical conditions that required treatment & monitoring. He was resumed & discharged on all his pertinent home medications for those health issues. He tolerated his treatment regimen without  any adverse effects or reactions reported. Patient's symptoms responded well to his treatment regimen warranting this discharge. He is also agreeable to this discharge as well.  During the course of his hospitalization, the 15-minute checks were adequate to ensure patient's safety.  Patient did not display any dangerous, violent or suicidal behavior on the unit. He interacted with the other patients & staff appropriately. He participated appropriately in the group sessions/therapies. His medications were addressed & adjusted to meet his needs. He was recommended for outpatient follow-up care & medication management upon discharge to assure his continuity of care.  At the time of discharge, patient is not reporting any acute suicidal/homicidal ideations. He feels more confident about his self-care & in managing his symptoms. He currently denies any new issues or concerns. Education and supportive counseling provided throughout his hospital stay & upon discharge.  Today upon his discharge evaluation with his treatment team, Kanai shares he is doing well. He denies any other specific concerns. He is sleeping well. His appetite is good. He denies other physical complaints. He denies AH/VH. He feels that his medications have been helpful & is in agreement  to continue his current treatment regimen as recommended. He was able to engage in safety planning including plan to return to [facility] or contact emergency services if he feels unable to maintain his own safety or the safety of others. Pt had no further questions, comments, or concerns. He left [facility] with all personal belongings in no apparent distress. Transportation per taxi cab. [facility] assisted with Taxi fare.   Physical Findings: AIMS:  , ,  ,  ,    CIWA:    COWS:      Psychiatric Specialty Exam:  Presentation  General Appearance:  Appropriate for Environment; Casual; Fairly Groomed  Eye Contact: Good  Speech: Clear and Coherent; Normal Rate  Speech  Volume: Normal    Mood and Affect  Mood: Euthymic  Affect: Appropriate; Congruent   Thought Process  Thought Processes: Coherent; Goal Directed; Linear  Descriptions of Associations:Intact  Orientation:Full (Time, Place and Person)  Thought Content:Logical  Hallucinations:Hallucinations: None Description of Auditory Hallucinations: NA.  Ideas of Reference:None  Suicidal Thoughts:Suicidal Thoughts: No  Homicidal Thoughts:Homicidal Thoughts: No   Sensorium  Memory: Immediate Good; Recent Good; Remote Good  Judgment: Good  Insight: Good   Executive Functions  Concentration: Good  Attention Span: Good  Recall: Good  Fund of Knowledge: Good  Language: Good   Psychomotor Activity  Psychomotor Activity: Psychomotor Activity: Normal  Musculoskeletal: Strength & Muscle Tone: within normal limits Gait & Station: normal Assets  Assets: Manufacturing systems engineer; Desire for Improvement; Financial Resources/Insurance; Housing; Physical Health; Resilience; Social Support   Sleep  Sleep: Sleep: Good Number of Hours of Sleep: 8  Physical Exam: Physical Exam Vitals and nursing note reviewed.  Constitutional:      Appearance: Normal appearance.  HENT:     Head: Atraumatic.     Nose: Nose normal.     Mouth/Throat:     Pharynx: Oropharynx is clear.  Cardiovascular:     Comments: Hx. HTN.  Patient is on two separate antihypertensive. Pulmonary:     Effort: Pulmonary effort is normal.  Genitourinary:    Comments: Deferred Musculoskeletal:        General: Normal range of motion.     Cervical back: Normal range of motion.  Neurological:     General: No focal deficit present.     Mental Status: He is alert and oriented to person, place, and time. Mental status is at baseline.    Review of Systems  Constitutional:  Negative for chills, diaphoresis and fever.  HENT:  Negative for congestion and sore throat.   Respiratory:  Negative for cough,  shortness of breath and wheezing.   Cardiovascular:  Negative for chest pain and palpitations.  Gastrointestinal:  Negative for abdominal pain, constipation, diarrhea, heartburn, nausea and vomiting.  Genitourinary:  Negative for dysuria.  Musculoskeletal:  Negative for joint pain and myalgias.  Skin:  Negative for itching and rash.  Neurological:  Negative for dizziness, tingling, tremors, sensory change, speech change, focal weakness, seizures, loss of consciousness, weakness and headaches.  Endo/Heme/Allergies:        Allergies: Zofran .   Other: Spinach.  Psychiatric/Behavioral:  Positive for hallucinations (Hx of (stable on medication).). Negative for depression, memory loss, substance abuse and suicidal ideas. The patient has insomnia (Hx of (stable on medication).). The patient is not nervous/anxious.    Blood pressure (!) 159/99, pulse 94, temperature 98.3 F (36.8 C), temperature source Oral, resp. rate 20, height 5' 11 (1.803 m), weight 77.6 kg, SpO2 100%. Body mass index is 23.85 kg/m.  Social History   Tobacco Use  Smoking Status Every Day   Types: Cigarettes  Smokeless Tobacco Never   Tobacco Cessation:  A prescription for an FDA-approved tobacco cessation medication recommended at discharge  Blood Alcohol level:  Lab Results  Component Value Date   [facility] <15 07/10/2024   ETH <15 05/10/2024   Metabolic Disorder Labs:  Lab Results  Component Value Date   HGBA1C 5.6 07/10/2024   MPG 114.02 07/10/2024   MPG 111.15 05/10/2024   Lab Results  Component Value Date   PROLACTIN 4.6 05/10/2024   PROLACTIN 55.0 (H) 10/13/2017   Lab Results  Component Value Date   CHOL 233 (H) 07/10/2024   TRIG 151 (H) 07/10/2024   HDL 101 07/10/2024   CHOLHDL 2.3 07/10/2024   VLDL 30 07/10/2024   LDLCALC 102 (H) 07/10/2024   LDLCALC 107 (H) 05/10/2024    See Psychiatric Specialty Exam and Suicide Risk Assessment completed by Attending Physician prior to discharge.  Discharge  destination:  Home  Is patient on multiple antipsychotic therapies at discharge:  No    Has Patient had three or more failed trials of antipsychotic monotherapy by history:  No  Recommended Plan for Multiple Antipsychotic Therapies: NA  Allergies as of 07/13/2024       Reactions   Spinach Hives   Zofran  [ondansetron ] Rash        Medication List     STOP taking these medications    Ensure Original Liqd       TAKE these medications      Indication  acetaminophen  500 MG tablet Commonly known as: TYLENOL  Take 500 mg by mouth every 6 (six) hours as needed for mild pain (pain score 1-3) or moderate pain (pain score 4-6).  Indication: Fever, Pain   amLODipine  10 MG tablet Commonly known as: NORVASC  Take 1 tablet (10 mg total) by mouth daily. For hypertension What changed: additional instructions  Indication: High Blood Pressure   benazepril  10 MG tablet Commonly known as: LOTENSIN  Take 1 tablet (10 mg total) by mouth daily. For hypertension. What changed: additional instructions  Indication: High Blood Pressure   benztropine  1 MG tablet Commonly known as: COGENTIN  Take 1 tablet (1 mg total) by mouth 2 (two) times daily. For prevention of antipsychotic side effects. What changed: additional instructions  Indication: Extrapyramidal Reaction caused by Medications   hydrOXYzine  25 MG tablet Commonly known as: ATARAX  Take 1 tablet (25 mg total) by mouth 3 (three) times daily as needed for anxiety.  Indication: Feeling Anxious   multivitamin tablet Take 1 tablet by mouth daily.  Indication: Nutritional Support   nicotine  21 mg/24hr patch Commonly known as: NICODERM CQ  - dosed in mg/24 hours Place 1 patch (21 mg total) onto the skin daily as needed. (May buy from over there counter): For smoking cessation. What changed:  when to take this reasons to take this additional instructions  Indication: Nicotine  Addiction   risperiDONE  0.5 MG tablet Commonly known  as: RISPERDAL  Take 3 tablets (1.5 mg total) by mouth 2 (two) times daily. For mood control What changed: additional instructions  Indication: Schizophrenia   traZODone  50 MG tablet Commonly known as: DESYREL  Take 1 tablet (50 mg total) by mouth at bedtime. For sleep What changed:  when to take this reasons to take this additional instructions  Indication: Trouble Sleeping   Vitamin D  (Ergocalciferol ) 1.25 MG (50000 UNIT) Caps capsule Commonly known as: DRISDOL  Take 1 capsule (50,000 Units total) by mouth every 7 (seven)  days. For bone health. Start taking on: July 19, 2024  Indication: Vitamin D  Deficiency        Follow-up Information     [facility] Follow up on 07/20/2024.   Why: You have a hospital follow up appointment for therapy and medication management services on 07/20/24 at 11:00 am  .  The appointment will be Virtual, telehealth. Contact information: [address]  [address] [place] [address] [phone]                Plan Of Care/Follow-up recommendations:  Activity: as tolerated Diet: heart healthy  Other: -Follow-up with your outpatient psychiatric provider -instructions on appointment date, time, and address (location) are provided to you in discharge paperwork.  -Take your psychiatric medications as prescribed at discharge - instructions are provided to you in the discharge paperwork  -Follow-up with outpatient primary care doctor and other specialists -for management of preventative medicine and chronic medical issues  -Testing: Follow-up with outpatient provider for abnormal lab results: NA  -If you are prescribed an atypical antipsychotic medication, we recommend that your outpatient psychiatrist follow routine screening for side effects within 3 months of discharge, including monitoring: AIMS scale, height, weight, blood pressure, fasting lipid panel, HbA1c, and fasting blood sugar.   -Recommend total abstinence from alcohol, tobacco,  and other illicit drug use at discharge.   -If your psychiatric symptoms recur, worsen, or if you have side effects to your psychiatric medications, call your outpatient psychiatric provider, 911, [phone] or go to the nearest emergency department.  -If suicidal thoughts occur, immediately call your outpatient psychiatric provider, 911, [phone] or go to the nearest emergency department.  Signed: Mac Bolster, NP, pmhnp, fnp-bc. 07/13/2024, 9:32 AM

## 2024-07-13 NOTE — Group Note (Signed)
 Recreation Therapy Group Note   Group Topic:Problem Solving  Group Date: 07/13/2024 Start Time: 0935 End Time: 1003 Facilitators: Aamari West-McCall, LRT,CTRS Location: [address] Dayroom   Group Topic: Communication, Team Building, Problem Solving  Goal Area(s) Addresses:  Patient will effectively work with peer towards shared goal.  Patient will identify skills used to make activity successful.  Patient will identify how skills used during activity can be used to reach post d/c goals.   Behavioral Response:   Intervention: STEM Activity  Activity: Straw Bridge. In teams of 3-5, patients were given 15 plastic drinking straws and an equal length of masking tape. Using the materials provided, patients were instructed to build a free standing bridge-like structure to suspend an everyday item (ex: puzzle box) off of the floor or table surface. All materials were required to be used by the team in their design. LRT facilitated post-activity discussion reviewing team process. Patients were encouraged to reflect how the skills used in this activity can be generalized to daily life post discharge.   Education: Pharmacist, community, Scientist, physiological, Discharge Planning   Education Outcome: Acknowledges education/In group clarification offered/Needs additional education.    Affect/Mood: N/A   Participation Level: Did not attend    Clinical Observations/Individualized Feedback:     Plan: Continue to engage patient in RT group sessions 2-3x/week.   Brittnee Gaetano-McCall, LRT,CTRS 07/13/2024 11:58 AM

## 2024-07-13 NOTE — Progress Notes (Signed)
(  Sleep Hours) -8 (Any PRNs that were needed, meds refused, or side effects to meds)- Tylenol  (Any disturbances and when (visitation, over night)-None (Concerns raised by the patient)- None (SI/HI/AVH)- Denied

## 2024-07-13 NOTE — BHH Suicide Risk Assessment (Signed)
 Suicide Risk Assessment  Discharge Assessment    [facility] Discharge Suicide Risk Assessment   Principal Problem: Paranoid schizophrenia (HCC)  Discharge Diagnoses: Principal Problem:   Paranoid schizophrenia (HCC) Active Problems:   MDD (major depressive disorder), recurrent episode, severe (HCC)  Total Time spent with patient: 45 minutes  Musculoskeletal: Strength & Muscle Tone: within normal limits Gait & Station: normal Patient leans: N/A  Psychiatric Specialty Exam  Presentation  General Appearance:  Appropriate for Environment; Casual; Fairly Groomed  Eye Contact: Good  Speech: Clear and Coherent; Normal Rate  Speech Volume: Normal  Handedness: Right   Mood and Affect  Mood: Euthymic  Duration of Depression Symptoms: Greater than two weeks  Affect: Appropriate; Congruent   Thought Process  Thought Processes: Coherent; Goal Directed; Linear  Descriptions of Associations:Intact  Orientation:Full (Time, Place and Person)  Thought Content:Logical  History of Schizophrenia/Schizoaffective disorder:Yes  Duration of Psychotic Symptoms:Greater than six months  Hallucinations:Hallucinations: None Description of Auditory Hallucinations: NA.  Ideas of Reference:None  Suicidal Thoughts:Suicidal Thoughts: No  Homicidal Thoughts:Homicidal Thoughts: No   Sensorium  Memory: Immediate Good; Recent Good; Remote Good  Judgment: Good  Insight: Good   Executive Functions  Concentration: Good  Attention Span: Good  Recall: Good  Fund of Knowledge: Good  Language: Good   Psychomotor Activity  Psychomotor Activity:Psychomotor Activity: Normal   Assets  Assets: Communication Skills; Desire for Improvement; Financial Resources/Insurance; Housing; Physical Health; Resilience; Social Support   Sleep  Sleep:Sleep: Good  Estimated Sleeping Duration (Last 24 Hours): 7.25-7.75 hours  Physical/Ros Exam: See the discharge  summary.  Blood pressure (!) 159/99, pulse 94, temperature 98.3 F (36.8 C), temperature source Oral, resp. rate 20, height 5' 11 (1.803 m), weight 77.6 kg, SpO2 100%. Body mass index is 23.85 kg/m.  Mental Status Per Nursing Assessment::   On Admission:  Suicidal ideation indicated by patient  Demographic Factors:  Male, Adolescent or young adult, and Unemployed  Loss Factors: Loss of significant relationship  Historical Factors: Family history of mental illness or substance abuse  Risk Reduction Factors:   Sense of responsibility to family, Living with another person, especially a relative, Positive social support, Positive therapeutic relationship, and Positive coping skills or problem solving skills  Continued Clinical Symptoms:  Schizophrenia:   Paranoid or undifferentiated type Previous Psychiatric Diagnoses and Treatments  Cognitive Features That Contribute To Risk:  Thought constriction (tunnel vision)    Suicide Risk:  Minimal: No identifiable suicidal ideation.  Patients presenting with no risk factors but with morbid ruminations; may be classified as minimal risk based on the severity of the depressive symptoms   Follow-up Information     [facility] Follow up on 07/20/2024.   Why: You have a hospital follow up appointment for therapy and medication management services on 07/20/24 at 11:00 am  .  The appointment will be Virtual, telehealth. Contact information: [address]  [address] [place] [address] [phone]                Plan Of Care/Follow-up recommendations:  See the discharge recommendation above.  Mac Bolster, NP, pmhnp,fnp-bc. 07/13/2024, 9:16 AM

## 2024-08-21 ENCOUNTER — Ambulatory Visit (HOSPITAL_COMMUNITY)

## 2024-08-21 DIAGNOSIS — F332 Major depressive disorder, recurrent severe without psychotic features: Secondary | ICD-10-CM

## 2024-08-21 NOTE — Progress Notes (Signed)
 Time: 8:34 am to 8:50 am  Summary:  Damon [name] presents today at [facility] as a walk in. He states he wants to get set up with a psychiatrist and someone to talk to about his problems.  His insurance is Medicare. Deno explains he was hospitalized at [facility]  in August (07-10-24 to 07-13-24). He reports he did not know what to do when he needed medicine again. The record shows he had a follow up with [facility] on 07-20-24. Therapist explains to Damon [name] that he can be seen at the [name] office given he has Medicare. Therapist in basket messages Johnsie O'Daniel to request appointments. She provides the following appointments:  Dr. Kapoor 10-10-24 at 9 am and Elgie Crest on 11-02-27 at 1 pm. He asks what to do if he runs out of medication before his MD appointment.  Therapists asks him if he has a PCP and he acknowledges that he does. He says he has an appointment to see his PCP on this Friday. Therapist suggest he take his medication bottles and appointment card to this appointment. Therapist explains that she has know pt's who were able to get their PCP to prescribe Psych medications if they knew they had a Psychiatrist appointment scheduled.  DX:  Damon [name] has been diagnosed with Major Depressive Disorder, Severe and Paranoid Schizophrenia.   Mental Status: Damon [name] is dressed casually today.  He is alert and cooperative with this therapist.  He describes his mood as good.  His affect is broad range. He denies S/HI.  He denies A/VH.  There is no evidence of delusions.  His insight and judgements are within normal limits. Cognitively, he is oriented x 5.  Damon Simpler, Damon [name], Damon [name], Damon [name]

## 2024-09-26 NOTE — Progress Notes (Deleted)
 Psychiatric Initial Adult Assessment   Patient Identification: Damon [name] MRN:  [id number] Date of Evaluation:  09/26/2024 Referral Source: Counselor Chief Complaint:  No chief complaint on file.  Visit Diagnosis: No diagnosis found.   Assessment:  Damon [name] is a 58 y.o. male with a history of paranoid schizophrenia, schizoaffective disorder, MDD, GAD who presents in person to [facility] at [facility] for initial evaluation on 09/26/2024.    At initial evaluation patient reports ***  A number of assessments were performed during the evaluation today including  PHQ-9 which they scored a *** on, GAD-7 which they scored a *** on, and [place] suicide severity screening which showed ***.    Risk Assessment: A suicide and violence risk assessment was performed as part of this evaluation. There patient is deemed to be at chronic elevated risk for self-harm/suicide given the following factors: {SABSUICIDERISKFACTORS:29780}. These risk factors are mitigated by the following factors: lack of active SI/HI, no known access to weapons or firearms, no history of previous suicide attempts, no history of violence, and motivation for treatment. The patient is deemed to be at chronic elevated risk for violence given the following factors: N/A. These risk factors are mitigated by the following factors: no known history of violence towards others, no known violence towards others in the last 6 months, no known history of threats of harm towards others, no known homicidal ideation in the last 6 months, no command hallucinations to harm others in the last 6 months, no active symptoms of psychosis, and no active symptoms of mania. There is no acute risk for suicide or violence at this time. The patient was educated about relevant modifiable risk factors including following recommendations for treatment of psychiatric illness and abstaining from substance abuse.  While future  psychiatric events cannot be accurately predicted, the patient does not currently require  acute inpatient psychiatric care and does not currently meet [place]  involuntary commitment criteria.  Patient was given contact information for crisis resources, behavioral health clinic and was instructed to call 911 for emergencies.    Plan: # *** Past medication trials:  Status of problem: *** Interventions: -- ***  # *** Past medication trials:  Status of problem: *** Interventions: -- ***  # *** Past medication trials:  Status of problem: *** Interventions: -- ***   History of Present Illness:  ***  Associated Signs/Symptoms: Depression Symptoms:  {DEPRESSION SYMPTOMS:20000} (Hypo) Manic Symptoms:  {BHH MANIC SYMPTOMS:22872} Anxiety Symptoms:  {[facility]HH ANXIETY SYMPTOMS:22873} Psychotic Symptoms:  {BHH PSYCHOTIC SYMPTOMS:22874} PTSD Symptoms: {BHH PTSD SYMPTOMS:22875}  Past Psychiatric History:  Past psychiatric diagnoses: Paranoid schizophrenia, schizoaffective disorder, MDD, GAD Psychiatric hospitalizations: Multiple admissions in the past for worsening auditory hallucinations, last admission in August 2025 Past suicide attempts: Denies Hx of self harm: Denies Hx of violence towards others: Denies Prior psychiatric providers: Dr. Jeppie Prior therapy: None Access to firearms: Denies  Prior medication trials: Risperdal  3 mg twice daily(last dose 1.5 mg BID), benztropine  2 mg, Remeron  15 mg, Prozac  20 mg, trazodone  50 mg, hydroxyzine  25 3 times daily, Seroquel  Substance use: Denies  Past Medical History:  Past Medical History:  Diagnosis Date   Depression    Hypertension    Paranoid schizophrenia (HCC)    Schizophrenia (HCC)    No past surgical history on file.  Family Psychiatric History: Nothing significant  Family History:  Family History  Problem Relation Age of Onset   Colon cancer Mother    Hyperlipidemia Father  Heart failure Brother     Diabetes Other    Stroke Other     Social History:   Social History   Socioeconomic History   Marital status: Single    Spouse name: Not on file   Number of children: Not on file   Years of education: Not on file   Highest education level: Not on file  Occupational History   Not on file  Tobacco Use   Smoking status: Every Day    Types: Cigarettes   Smokeless tobacco: Never  Substance and Sexual Activity   Alcohol use: Yes    Alcohol/week: 1.0 standard drink of alcohol    Types: 1 Cans of beer per week    Comment: one can of beer weekly   Drug use: Not Currently    Types: Marijuana   Sexual activity: Yes    Birth control/protection: Condom  Other Topics Concern   Not on file  Social History Narrative   ** Merged History Encounter **       Social Drivers of Health   Financial Resource Strain: Low Risk  (01/13/2023)   Overall Financial Resource Strain (CARDIA)    Difficulty of Paying Living Expenses: Not hard at all  Food Insecurity: Food Insecurity Present (07/10/2024)   Hunger Vital Sign    Worried About Running Out of Food in the Last Year: Sometimes true    Ran Out of Food in the Last Year: Never true  Transportation Needs: No Transportation Needs (07/10/2024)   PRAPARE - Administrator, Civil Service (Medical): No    Lack of Transportation (Non-Medical): No  Physical Activity: Sufficiently Active (01/13/2023)   Exercise Vital Sign    Days of Exercise per Week: 5 days    Minutes of Exercise per Session: 60 min  Stress: Stress Concern Present (01/13/2023)   [employer] of Occupational Health - Occupational Stress Questionnaire    Feeling of Stress : To some extent  Social Connections: Socially Isolated (05/11/2024)   Social Connection and Isolation Panel    Frequency of Communication with Friends and Family: Twice a week    Frequency of Social Gatherings with Friends and Family: Once a week    Attends Religious Services: Never    Doctor, General Practice or Organizations: No    Attends Banker Meetings: Never    Marital Status: Never married    Additional Social History: ***  Allergies:   Allergies  Allergen Reactions   Spinach Hives   Zofran  [Ondansetron ] Rash    Metabolic Disorder Labs: Lab Results  Component Value Date   HGBA1C 5.6 07/10/2024   MPG 114.02 07/10/2024   MPG 111.15 05/10/2024   Lab Results  Component Value Date   PROLACTIN 4.6 05/10/2024   PROLACTIN 55.0 (H) 10/13/2017   Lab Results  Component Value Date   CHOL 233 (H) 07/10/2024   TRIG 151 (H) 07/10/2024   HDL 101 07/10/2024   CHOLHDL 2.3 07/10/2024   VLDL 30 07/10/2024   LDLCALC 102 (H) 07/10/2024   LDLCALC 107 (H) 05/10/2024   Lab Results  Component Value Date   TSH 0.916 07/10/2024    Therapeutic Level Labs: No results found for: LITHIUM No results found for: CBMZ Lab Results  Component Value Date   VALPROATE <10 (L) 05/10/2024    Current Medications: Current Outpatient Medications  Medication Sig Dispense Refill   acetaminophen  (TYLENOL ) 500 MG tablet Take 500 mg by mouth every 6 (six) hours as needed for  mild pain (pain score 1-3) or moderate pain (pain score 4-6).     amLODipine  (NORVASC ) 10 MG tablet Take 1 tablet (10 mg total) by mouth daily. For hypertension 30 tablet 0   benazepril  (LOTENSIN ) 10 MG tablet Take 1 tablet (10 mg total) by mouth daily. For hypertension. 30 tablet 0   benztropine  (COGENTIN ) 1 MG tablet Take 1 tablet (1 mg total) by mouth 2 (two) times daily. For prevention of antipsychotic side effects. 60 tablet 0   hydrOXYzine  (ATARAX ) 25 MG tablet Take 1 tablet (25 mg total) by mouth 3 (three) times daily as needed for anxiety. 75 tablet 0   Multiple Vitamin (MULTIVITAMIN) tablet Take 1 tablet by mouth daily.     nicotine  (NICODERM CQ  - DOSED IN MG/24 HOURS) 21 mg/24hr patch Place 1 patch (21 mg total) onto the skin daily as needed. (May buy from over there counter): For smoking cessation.      risperiDONE  (RISPERDAL ) 0.5 MG tablet Take 3 tablets (1.5 mg total) by mouth 2 (two) times daily. For mood control 90 tablet 0   traZODone  (DESYREL ) 50 MG tablet Take 1 tablet (50 mg total) by mouth at bedtime. For sleep 30 tablet 0   Vitamin D , Ergocalciferol , (DRISDOL ) 1.25 MG (50000 UNIT) CAPS capsule Take 1 capsule (50,000 Units total) by mouth every 7 (seven) days. For bone health. 5 capsule 0   No current facility-administered medications for this visit.    Musculoskeletal: Strength & Muscle Tone: within normal limits Gait & Station: normal Patient leans: N/A  Psychiatric Specialty Exam:  Psychiatric Specialty Exam: There were no vitals taken for this visit.There is no height or weight on file to calculate BMI. Review of Systems  General Appearance: Casual and Fairly Groomed  Eye Contact:  Good  Speech:  Clear and Coherent  Volume:  Normal  Mood:  Euthymic  Affect:  Appropriate  Thought Content: Logical   Suicidal Thoughts:  No  Homicidal Thoughts:  No  Thought Process:  Coherent  Orientation:  Full (Time, Place, and Person)    Memory: Immediate;   Good Recent;   Good Remote;   Good  Judgment:  Fair  Insight:  Fair  Concentration:  Concentration: Good and Attention Span: Good  Recall:  not formally assessed   Fund of Knowledge: Fair  Language: Fair  Psychomotor Activity:  Normal  Akathisia:  No  AIMS (if indicated): not done  Assets:  Communication Skills Desire for Improvement Financial Resources/Insurance Leisure Time Physical Health Resilience Transportation  ADL's:  Intact  Cognition: WNL  Sleep:  Fair    Screenings: AIMS    Flowsheet Row [facility]  AIMS Total Score 0   AUDIT    Flowsheet Row [facility] (Discharged) from [facility]11/2024 in [facility] Admission  [facility]  Alcohol Use Disorder Identification Test Final Score (AUDIT) 4 0 2   GAD-7    Flowsheet Row Counselor from 01/13/2023 in [facility]  Total GAD-7 Score 17   PHQ2-9    Flowsheet Row ED from 01/29/2024 in [facility] Counselor from 01/13/2023 in [facility]  PHQ-2 Total Score 0 5  PHQ-9 Total Score 2 21   Flowsheet Row [facility] Most recent reading at 07/10/2024  7:45 PM ED from 07/10/2024 in [facility] Most recent reading at 07/10/2024  1:03 PM Admission (Discharged) from 05/11/2024 in [facility] MEDICINE Most recent reading at 05/17/2024  8:30 PM  C-SSRS RISK CATEGORY High Risk High Risk Error: Q7 should not be populated when Q6 is No     Collaboration of Care: Medication Management AEB Dr. Susen, PCP notes  Patient/Guardian was advised Release of Information must be obtained prior to any record release in order to collaborate their care with an outside provider. Patient/Guardian was advised if they have not already done so to contact the registration department to sign all necessary forms in order for us  to release information regarding their care.   Consent: Patient/Guardian gives verbal consent for treatment and assignment of benefits for services provided during this visit. Patient/Guardian expressed understanding and agreed to proceed.   Teofila Bowery, MD 11/12/20259:26 AM

## 2024-10-10 ENCOUNTER — Ambulatory Visit (HOSPITAL_COMMUNITY)

## 2024-11-01 ENCOUNTER — Ambulatory Visit (INDEPENDENT_AMBULATORY_CARE_PROVIDER_SITE_OTHER): Admitting: Clinical

## 2024-11-01 DIAGNOSIS — F251 Schizoaffective disorder, depressive type: Secondary | ICD-10-CM | POA: Diagnosis not present

## 2024-11-06 ENCOUNTER — Encounter (HOSPITAL_COMMUNITY): Payer: Self-pay | Admitting: Clinical
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
## 2024-12-06 ENCOUNTER — Emergency Department (HOSPITAL_COMMUNITY)
Admission: EM | Admit: 2024-12-06 | Discharge: 2024-12-07 | Disposition: A | Discharge status: Other Acute Inpatient Hospital | Attending: Emergency Medicine | Admitting: Emergency Medicine

## 2024-12-06 ENCOUNTER — Encounter (HOSPITAL_COMMUNITY): Payer: Self-pay

## 2024-12-06 ENCOUNTER — Other Ambulatory Visit: Payer: Self-pay

## 2024-12-06 DIAGNOSIS — F259 Schizoaffective disorder, unspecified: Secondary | ICD-10-CM

## 2024-12-06 DIAGNOSIS — F39 Unspecified mood [affective] disorder: Secondary | ICD-10-CM

## 2024-12-06 DIAGNOSIS — Z79899 Other long term (current) drug therapy: Secondary | ICD-10-CM | POA: Diagnosis not present

## 2024-12-06 DIAGNOSIS — F251 Schizoaffective disorder, depressive type: Secondary | ICD-10-CM | POA: Diagnosis not present

## 2024-12-06 DIAGNOSIS — Z91148 Patient's other noncompliance with medication regimen for other reason: Secondary | ICD-10-CM | POA: Insufficient documentation

## 2024-12-06 DIAGNOSIS — R443 Hallucinations, unspecified: Secondary | ICD-10-CM | POA: Diagnosis present

## 2024-12-06 DIAGNOSIS — R440 Auditory hallucinations: Secondary | ICD-10-CM

## 2024-12-06 DIAGNOSIS — F419 Anxiety disorder, unspecified: Secondary | ICD-10-CM

## 2024-12-06 DIAGNOSIS — R45851 Suicidal ideations: Secondary | ICD-10-CM | POA: Insufficient documentation

## 2024-12-06 DIAGNOSIS — R030 Elevated blood-pressure reading, without diagnosis of hypertension: Secondary | ICD-10-CM

## 2024-12-06 DIAGNOSIS — I10 Essential (primary) hypertension: Secondary | ICD-10-CM | POA: Insufficient documentation

## 2024-12-06 LAB — COMPREHENSIVE METABOLIC PANEL WITH GFR
ALT: 24 U/L (ref 0–44)
AST: 32 U/L (ref 15–41)
Albumin: 4.5 g/dL (ref 3.5–5.0)
Alkaline Phosphatase: 58 U/L (ref 38–126)
Anion gap: 15 (ref 5–15)
BUN: 9 mg/dL (ref 6–20)
CO2: 20 mmol/L — ABNORMAL LOW (ref 22–32)
Calcium: 9.2 mg/dL (ref 8.9–10.3)
Chloride: 103 mmol/L (ref 98–111)
Creatinine, Ser: 1.12 mg/dL (ref 0.61–1.24)
GFR, Estimated: >60 mL/min
Glucose, Bld: 98 mg/dL (ref 70–99)
Potassium: 4.2 mmol/L (ref 3.5–5.1)
Sodium: 138 mmol/L (ref 135–145)
Total Bilirubin: 0.4 mg/dL (ref 0.0–1.2)
Total Protein: 7.5 g/dL (ref 6.5–8.1)

## 2024-12-06 LAB — URINE DRUG SCREEN
Amphetamines: NEGATIVE
Barbiturates: NEGATIVE
Benzodiazepines: NEGATIVE
Cocaine: NEGATIVE
Fentanyl: NEGATIVE
Methadone Scn, Ur: NEGATIVE
Opiates: NEGATIVE
Tetrahydrocannabinol: NEGATIVE

## 2024-12-06 LAB — CBC
HCT: 41.5 % (ref 39.0–52.0)
Hemoglobin: 14.2 g/dL (ref 13.0–17.0)
MCH: 31.6 pg (ref 26.0–34.0)
MCHC: 34.2 g/dL (ref 30.0–36.0)
MCV: 92.2 fL (ref 80.0–100.0)
Platelets: 333 K/uL (ref 150–400)
RBC: 4.5 MIL/uL (ref 4.22–5.81)
RDW: 12.7 % (ref 11.5–15.5)
WBC: 5.4 K/uL (ref 4.0–10.5)
nRBC: 0 % (ref 0.0–0.2)

## 2024-12-06 LAB — ETHANOL: Alcohol, Ethyl (B): <15 mg/dL

## 2024-12-06 MED ORDER — RISPERIDONE 1 MG PO TABS
1.5000 mg | ORAL_TABLET | Freq: Two times a day (BID) | ORAL | Status: DC
  Administered 2024-12-06: 1.5 mg via ORAL
  Filled 2024-12-06 (×2): qty 1

## 2024-12-06 MED ORDER — HYDROXYZINE HCL 25 MG PO TABS: 25.0000 mg | ORAL_TABLET | Freq: Three times a day (TID) | ORAL | Status: DC | PRN

## 2024-12-06 MED ORDER — TRAZODONE HCL 50 MG PO TABS
50.0000 mg | ORAL_TABLET | Freq: Every day | ORAL | Status: DC
  Administered 2024-12-06: 50 mg via ORAL
  Filled 2024-12-06: qty 1

## 2024-12-06 MED ORDER — BENAZEPRIL HCL 5 MG PO TABS
10.0000 mg | ORAL_TABLET | Freq: Every day | ORAL | Status: DC
  Administered 2024-12-06: 10 mg via ORAL
  Filled 2024-12-06 (×2): qty 2

## 2024-12-06 MED ORDER — BENZTROPINE MESYLATE 1 MG PO TABS
1.0000 mg | ORAL_TABLET | Freq: Two times a day (BID) | ORAL | Status: DC
  Administered 2024-12-06: 1 mg via ORAL
  Filled 2024-12-06 (×2): qty 1

## 2024-12-06 MED ORDER — AMLODIPINE BESYLATE 5 MG PO TABS
10.0000 mg | ORAL_TABLET | Freq: Every day | ORAL | Status: DC
  Administered 2024-12-06: 10 mg via ORAL
  Filled 2024-12-06: qty 2

## 2024-12-06 NOTE — ED Triage Notes (Signed)
 Pt states hearing voices for 2 weeks and last took medication 3 weeks ago. Denies HI. C/O SI without a plan for 2 weeks. Axox4.

## 2024-12-06 NOTE — ED Notes (Signed)
Pt changing into gown now

## 2024-12-06 NOTE — ED Notes (Signed)
 Pt placed on a hospital bed

## 2024-12-06 NOTE — Discharge Instructions (Addendum)
 It was our pleasure to provide your ER care today - we hope that you feel better.  Follow up closely with primary care doctor and behavioral health provider in the coming week.  Your blood pressure is also high today - make sure to take your meds as prescribed, and follow up closely with primary care doctor in the coming week.   For mental health issues and/or crisis, you may also go directly to the [facility] - they are open 24/7 and walk-ins are welcome.    Return to ER if worse, new symptoms, fevers, chest pain, trouble breathing, or other emergency concern.

## 2024-12-06 NOTE — ED Provider Notes (Signed)
" °  [facility] Provider Assume Care Note I assumed care of Damon [name] on 12/06/2024 at 5 PM from Dr. Bernard.   Briefly, Damon [name] is a 59 y.o. male who: PMHx: Schizophrenia P/w auditory hallucinations and feeling increased stress in the setting of medication nonadherence Awaiting psychiatry consult  Plan at the time of handoff: Follow-up psychiatry recommendations   Please refer to the original providers note for additional information regarding the care of Rodolph L Suchocki.  Reassessment: Patient resting quietly  Vital Signs:  ED Triage Vitals  Encounter Vitals Group     BP 12/06/24 1013 (!) 175/118     Girls Systolic BP Percentile --      Girls Diastolic BP Percentile --      Boys Systolic BP Percentile --      Boys Diastolic BP Percentile --      Pulse Rate 12/06/24 1013 (!) 116     Resp 12/06/24 1013 18     Temp 12/06/24 1013 97.9 F (36.6 C)     Temp Source 12/06/24 1411 Oral     SpO2 12/06/24 1013 100 %     Weight 12/06/24 1013 190 lb (86.2 kg)     Height 12/06/24 1013 5' 11 (1.803 m)     Head Circumference --      Peak Flow --      Pain Score 12/06/24 1013 10     Pain Loc --      Pain Education --      Exclude from Growth Chart --      Hemodynamics:  The patient is hemodynamically stable. Mental Status:  The patient is alert  Additional MDM: Psychiatry was not able to see the patient during my shift, they are scheduled to evaluate the patient by telepsychiatry at 0045.  Patient is under IVC, plan for oncoming provider to follow-up psychiatry recommendations.  Disposition: Will remain in the emergency department under IVC pending psychiatry recommendations.   FREDRIK CANDIE Later, MD Emergency Medicine    Later Jerilynn RAMAN, MD 12/06/24 2302  "

## 2024-12-06 NOTE — ED Provider Triage Note (Addendum)
 Emergency Medicine Provider Triage Evaluation Note  Damon [name] , a 59 y.o. male  was evaluated in triage.  Pt complains hx schizophrenia, notes intermittent hearing of voices and feeling anxious and tense/stressed. Denies depression or thoughts of harm to self or others.   Review of Systems  Positive: Mental health issues Negative: Fever, headache, chest pain.   Physical Exam  BP (!) 175/118 (BP Location: Right Arm)   Pulse (!) 116   Temp 97.9 F (36.6 C)   Resp 18   Ht 1.803 m (5' 11)   Wt 86.2 kg   SpO2 100%   BMI 26.50 kg/m  Gen:   Awake, no distress   Resp:  Normal effort breathing comfortably. BH:  Calm, not actively responding to internal stimuli. Normal mood and affect. Denies thoughts of harm to self or others.   Medical Decision Making  Medically screening exam initiated at 10:22 AM.  Appropriate orders placed.  Damon [name] was informed that the remainder of the evaluation will be completed by another provider, this initial triage assessment does not replace that evaluation, and the importance of remaining in the ED until their evaluation is complete.  Labs, bh consult.       Bernard Drivers, MD 12/06/24 1048

## 2024-12-06 NOTE — ED Provider Notes (Signed)
 " [place] EMERGENCY DEPARTMENT AT [facility] Provider Note   CSN: [id number] Arrival date & time: 12/06/24  1009     Patient presents with: Hallucinations   Damon [name] is a 59 y.o. male.   Pt with hx schizophrenia indicates has been out of meds for 'awhile' and is intermittently hearing voices and feeling anxious and tense. Denies specific content to voices. No visual hallucinations. Denies feeling depressed. Normal appetite. No thoughts or plan of harming self or others.   The history is provided by the patient and medical records.       Prior to Admission medications  Medication Sig Start Date End Date Taking? Authorizing Provider  acetaminophen  (TYLENOL ) 500 MG tablet Take 500 mg by mouth every 6 (six) hours as needed for mild pain (pain score 1-3) or moderate pain (pain score 4-6).    [provider]  amLODipine  (NORVASC ) 10 MG tablet Take 1 tablet (10 mg total) by mouth daily. For hypertension 07/13/24   Collene Gouge I, NP  benazepril  (LOTENSIN ) 10 MG tablet Take 1 tablet (10 mg total) by mouth daily. For hypertension. 07/13/24   Collene Gouge I, NP  benztropine  (COGENTIN ) 1 MG tablet Take 1 tablet (1 mg total) by mouth 2 (two) times daily. For prevention of antipsychotic side effects. 07/13/24   Collene Gouge I, NP  hydrOXYzine  (ATARAX ) 25 MG tablet Take 1 tablet (25 mg total) by mouth 3 (three) times daily as needed for anxiety. 07/13/24   Collene Gouge I, NP  Multiple Vitamin (MULTIVITAMIN) tablet Take 1 tablet by mouth daily.    [provider]  nicotine  (NICODERM CQ  - DOSED IN MG/24 HOURS) 21 mg/24hr patch Place 1 patch (21 mg total) onto the skin daily as needed. (May buy from over there counter): For smoking cessation. 07/13/24   Collene Gouge I, NP  risperiDONE  (RISPERDAL ) 0.5 MG tablet Take 3 tablets (1.5 mg total) by mouth 2 (two) times daily. For mood control 07/13/24   Collene Gouge I, NP  traZODone  (DESYREL ) 50 MG tablet Take 1 tablet (50 mg  total) by mouth at bedtime. For sleep 07/13/24   Collene Gouge I, NP  Vitamin D , Ergocalciferol , (DRISDOL ) 1.25 MG (50000 UNIT) CAPS capsule Take 1 capsule (50,000 Units total) by mouth every 7 (seven) days. For bone health. 07/19/24   Collene Gouge FERNS, NP    Allergies: Spinach and Zofran  [ondansetron ]    Review of Systems  Constitutional:  Negative for fever.  HENT:  Negative for sore throat.   Respiratory:  Negative for shortness of breath.   Cardiovascular:  Negative for chest pain.  Gastrointestinal:  Negative for abdominal pain.  Genitourinary:  Negative for flank pain.  Musculoskeletal:  Negative for back pain and neck pain.  Neurological:  Negative for headaches.  Psychiatric/Behavioral:  Negative for self-injury and suicidal ideas.     Updated Vital Signs BP (!) 175/118 (BP Location: Right Arm)   Pulse (!) 116   Temp 97.9 F (36.6 C)   Resp 18   Ht 1.803 m (5' 11)   Wt 86.2 kg   SpO2 100%   BMI 26.50 kg/m   Physical Exam Vitals and nursing note reviewed.  Constitutional:      Appearance: Normal appearance. He is well-developed.  HENT:     Head: Atraumatic.     Nose: Nose normal.     Mouth/Throat:     Mouth: Mucous membranes are moist.     Pharynx: Oropharynx is clear.  Eyes:  General: No scleral icterus.    Conjunctiva/sclera: Conjunctivae normal.     Pupils: Pupils are equal, round, and reactive to light.  Neck:     Trachea: No tracheal deviation.  Cardiovascular:     Rate and Rhythm: Normal rate and regular rhythm.     Pulses: Normal pulses.     Heart sounds: Normal heart sounds. No murmur heard.    No friction rub. No gallop.  Pulmonary:     Effort: Pulmonary effort is normal. No accessory muscle usage or respiratory distress.     Breath sounds: Normal breath sounds.  Abdominal:     General: There is no distension.     Palpations: Abdomen is soft.     Tenderness: There is no abdominal tenderness.  Musculoskeletal:        General: No swelling or  tenderness.     Cervical back: Normal range of motion and neck supple. No rigidity.  Skin:    General: Skin is warm and dry.     Findings: No rash.  Neurological:     Mental Status: He is alert.     Comments: Alert, speech clear. Motor/sens grossly intact bil. Steady gait.   Psychiatric:        Mood and Affect: Mood normal.     Comments: Mood/affect appears grossly normal. Pt pleasant and conversant. Endorses periodic hallucinations although currently does not appear to be actively responding to internal stimuli. No thought and/or plan to harm self or others.      (all labs ordered are listed, but only abnormal results are displayed) Results for orders placed or performed during the hospital encounter of 12/06/24  Comprehensive metabolic panel   Collection Time: 12/06/24 10:16 AM  Result Value Ref Range   Sodium 138 135 - 145 mmol/L   Potassium 4.2 3.5 - 5.1 mmol/L   Chloride 103 98 - 111 mmol/L   CO2 20 (L) 22 - 32 mmol/L   Glucose, Bld 98 70 - 99 mg/dL   BUN 9 6 - 20 mg/dL   Creatinine, Ser 8.87 0.61 - 1.24 mg/dL   Calcium 9.2 8.9 - 89.6 mg/dL   Total Protein 7.5 6.5 - 8.1 g/dL   Albumin 4.5 3.5 - 5.0 g/dL   AST 32 15 - 41 U/L   ALT 24 0 - 44 U/L   Alkaline Phosphatase 58 38 - 126 U/L   Total Bilirubin 0.4 0.0 - 1.2 mg/dL   GFR, Estimated >39 >39 mL/min   Anion gap 15 5 - 15  Ethanol   Collection Time: 12/06/24 10:16 AM  Result Value Ref Range   Alcohol, Ethyl (B) <15 <15 mg/dL  cbc   Collection Time: 12/06/24 10:16 AM  Result Value Ref Range   WBC 5.4 4.0 - 10.5 K/uL   RBC 4.50 4.22 - 5.81 MIL/uL   Hemoglobin 14.2 13.0 - 17.0 g/dL   HCT 58.4 60.9 - 47.9 %   MCV 92.2 80.0 - 100.0 fL   MCH 31.6 26.0 - 34.0 pg   MCHC 34.2 30.0 - 36.0 g/dL   RDW 87.2 88.4 - 84.4 %   Platelets 333 150 - 400 K/uL   nRBC 0.0 0.0 - 0.2 %  Rapid urine drug screen (hospital performed)   Collection Time: 12/06/24 10:22 AM  Result Value Ref Range   Opiates NEGATIVE NEGATIVE   Cocaine  NEGATIVE NEGATIVE   Benzodiazepines NEGATIVE NEGATIVE   Amphetamines NEGATIVE NEGATIVE   Tetrahydrocannabinol NEGATIVE NEGATIVE   Barbiturates NEGATIVE NEGATIVE  Methadone Scn, Ur NEGATIVE NEGATIVE   Fentanyl  NEGATIVE NEGATIVE   EKG: None  Radiology: No results found.   Procedures   Medications Ordered in the ED  amLODipine  (NORVASC ) tablet 10 mg (has no administration in time range)  benztropine  (COGENTIN ) tablet 1 mg (has no administration in time range)  benazepril  (LOTENSIN ) tablet 10 mg (has no administration in time range)  hydrOXYzine  (ATARAX ) tablet 25 mg (has no administration in time range)  risperiDONE  (RISPERDAL ) tablet 1.5 mg (has no administration in time range)  traZODone  (DESYREL ) tablet 50 mg (has no administration in time range)                                    Medical Decision Making Problems Addressed: Anxiety: acute illness or injury    Details: Acute/chronic Auditory hallucinations: acute illness or injury    Details: Acute/chronic Elevated blood pressure reading: acute illness or injury Mood disorder: acute illness or injury Non compliance w medication regimen: chronic illness or injury Schizoaffective disorder, unspecified type (HCC): chronic illness or injury with exacerbation, progression, or side effects of treatment that poses a threat to life or bodily functions Uncontrolled hypertension: acute illness or injury  Amount and/or Complexity of Data Reviewed External Data Reviewed: notes. Labs: ordered. Decision-making details documented in ED Course. Discussion of management or test interpretation with external provider(s): Bh  team.   Risk Prescription drug management. Decision regarding hospitalization.   Labs ordered/sent.   Differential diagnosis includes acute psychosis, schizophrenia, etc. Dispo decision including potential need for admission considered - will get labs and reassess.   Reviewed nursing notes and prior charts for  additional history. External reports reviewed.   Labs reviewed/interpreted by me - chem unremarkable. Wbc and hgb normal. Uds neg.   Home meds restarted.  Bh consult placed.   The patient has been placed in psychiatric observation due to the need to provide a safe environment for the patient while obtaining psychiatric consultation and evaluation, as well as ongoing medical and medication management to treat the patient's condition.    Recheck pt no change or new symptoms.   1640, bp eval pending - signed out to oncoming EDP Dr Rogelia, that bh eval pending, dispo per bh team.        Final diagnoses:  None    ED Discharge Orders     None          Bernard Drivers, MD 12/06/24 1642  "

## 2024-12-06 NOTE — ED Notes (Signed)
Pt is not IVC for now

## 2024-12-06 NOTE — ED Notes (Addendum)
 Belongings placed in locker number 5. Security called to wand pt.

## 2024-12-07 ENCOUNTER — Encounter: Payer: Self-pay | Admitting: Psychiatry

## 2024-12-07 ENCOUNTER — Encounter (HOSPITAL_COMMUNITY): Payer: Self-pay | Admitting: Psychiatry

## 2024-12-07 ENCOUNTER — Inpatient Hospital Stay
Admission: AD | Admit: 2024-12-07 | Discharge: 2024-12-13 | Disposition: A | Discharge status: Home / Self Care | Source: Ambulatory Visit | Attending: Psychiatry | Admitting: Psychiatry

## 2024-12-07 DIAGNOSIS — F259 Schizoaffective disorder, unspecified: Secondary | ICD-10-CM | POA: Diagnosis not present

## 2024-12-07 DIAGNOSIS — F251 Schizoaffective disorder, depressive type: Secondary | ICD-10-CM

## 2024-12-07 DIAGNOSIS — R45851 Suicidal ideations: Secondary | ICD-10-CM

## 2024-12-07 MED ORDER — RISPERIDONE 1 MG PO TABS
1.5000 mg | ORAL_TABLET | Freq: Two times a day (BID) | ORAL | Status: DC
  Administered 2024-12-07 – 2024-12-09 (×4): 1.5 mg via ORAL
  Filled 2024-12-07 (×4): qty 2

## 2024-12-07 MED ORDER — BENAZEPRIL HCL 10 MG PO TABS
10.0000 mg | ORAL_TABLET | Freq: Every day | ORAL | Status: DC
  Administered 2024-12-07 – 2024-12-08 (×2): 10 mg via ORAL
  Filled 2024-12-07 (×2): qty 1

## 2024-12-07 MED ORDER — AMLODIPINE BESYLATE 5 MG PO TABS
10.0000 mg | ORAL_TABLET | Freq: Every day | ORAL | Status: DC
  Administered 2024-12-07 – 2024-12-08 (×2): 10 mg via ORAL
  Filled 2024-12-07 (×2): qty 2

## 2024-12-07 MED ORDER — OLANZAPINE 5 MG PO TBDP: 5.0000 mg | ORAL_TABLET | Freq: Three times a day (TID) | ORAL | Status: DC | PRN

## 2024-12-07 MED ORDER — TRAZODONE HCL 50 MG PO TABS
50.0000 mg | ORAL_TABLET | Freq: Every day | ORAL | Status: DC
  Administered 2024-12-07 – 2024-12-12 (×6): 50 mg via ORAL
  Filled 2024-12-07 (×6): qty 1

## 2024-12-07 MED ORDER — NICOTINE 21 MG/24HR TD PT24
21.0000 mg | MEDICATED_PATCH | Freq: Every day | TRANSDERMAL | Status: DC | PRN
  Administered 2024-12-07 – 2024-12-13 (×6): 21 mg via TRANSDERMAL
  Filled 2024-12-07 (×5): qty 1

## 2024-12-07 MED ORDER — NICOTINE 14 MG/24HR TD PT24
14.0000 mg | MEDICATED_PATCH | Freq: Every day | TRANSDERMAL | Status: DC
  Administered 2024-12-07: 14 mg via TRANSDERMAL
  Filled 2024-12-07: qty 1

## 2024-12-07 MED ORDER — ZIPRASIDONE MESYLATE 20 MG IM SOLR: 10.0000 mg | Freq: Four times a day (QID) | INTRAMUSCULAR | Status: DC | PRN

## 2024-12-07 MED ORDER — ADULT MULTIVITAMIN W/MINERALS CH
1.0000 | ORAL_TABLET | Freq: Every day | ORAL | Status: DC
  Administered 2024-12-07 – 2024-12-13 (×6): 1 via ORAL
  Filled 2024-12-07 (×8): qty 1

## 2024-12-07 MED ORDER — OLANZAPINE 10 MG IM SOLR: 5.0000 mg | Freq: Three times a day (TID) | INTRAMUSCULAR | Status: DC | PRN

## 2024-12-07 MED ORDER — VITAMIN D (ERGOCALCIFEROL) 1.25 MG (50000 UNIT) PO CAPS
50000.0000 [IU] | ORAL_CAPSULE | ORAL | Status: DC
  Administered 2024-12-09: 50000 [IU] via ORAL
  Filled 2024-12-07: qty 1

## 2024-12-07 NOTE — BH IP Treatment Plan (Signed)
 Interdisciplinary Treatment and Diagnostic Plan Update  12/07/2024 Time of Session: 10:42AM Damon [name] MRN: [id number]  Principal Diagnosis: Schizoaffective disorder, unspecified type (HCC)  Secondary Diagnoses: Principal Problem:   Schizoaffective disorder, unspecified type (HCC)   Current Medications:  Current Facility-Administered Medications  Medication Dose Route Frequency Provider Last Rate Last Admin   OLANZapine (ZYPREXA) injection 5 mg  5 mg Intramuscular TID PRN Onuoha, Chinwendu V, NP       OLANZapine zydis (ZYPREXA) disintegrating tablet 5 mg  5 mg Oral TID PRN Onuoha, Chinwendu V, NP       PTA Medications: Medications Prior to Admission  Medication Sig Dispense Refill Last Dose/Taking   amLODipine  (NORVASC ) 10 MG tablet Take 1 tablet (10 mg total) by mouth daily. For hypertension (Patient not taking: Reported on 12/06/2024) 30 tablet 0    benazepril  (LOTENSIN ) 10 MG tablet Take 1 tablet (10 mg total) by mouth daily. For hypertension. (Patient not taking: Reported on 12/06/2024) 30 tablet 0    benztropine  (COGENTIN ) 1 MG tablet Take 1 tablet (1 mg total) by mouth 2 (two) times daily. For prevention of antipsychotic side effects. (Patient not taking: Reported on 12/06/2024) 60 tablet 0    hydrOXYzine  (ATARAX ) 25 MG tablet Take 1 tablet (25 mg total) by mouth 3 (three) times daily as needed for anxiety. (Patient not taking: Reported on 12/06/2024) 75 tablet 0    Multiple Vitamins-Minerals (MULTIVITAMIN MEN 50+) TABS Take 1 tablet by mouth daily. (Patient not taking: Reported on 12/06/2024)      nicotine  (NICODERM CQ  - DOSED IN MG/24 HOURS) 21 mg/24hr patch Place 1 patch (21 mg total) onto the skin daily as needed. (May buy from over there counter): For smoking cessation.      Nutritional Supplements (ENSURE ORIGINAL) LIQD Take 1 Bottle by mouth 2 (two) times daily as needed (poor PO intake).      risperiDONE  (RISPERDAL ) 0.5 MG tablet Take 3 tablets (1.5 mg total) by mouth 2  (two) times daily. For mood control (Patient not taking: Reported on 12/06/2024) 90 tablet 0    traZODone  (DESYREL ) 50 MG tablet Take 1 tablet (50 mg total) by mouth at bedtime. For sleep (Patient not taking: Reported on 12/06/2024) 30 tablet 0    Vitamin D , Ergocalciferol , (DRISDOL ) 1.25 MG (50000 UNIT) CAPS capsule Take 1 capsule (50,000 Units total) by mouth every 7 (seven) days. For bone health. (Patient not taking: Reported on 12/06/2024) 5 capsule 0     Patient Stressors: Medication change or noncompliance    Patient Strengths: Ability for insight  Capable of independent living  Physical Health   Treatment Modalities: Medication Management, Group therapy, Case management,  1 to 1 session with clinician, Psychoeducation, Recreational therapy.   Physician Treatment Plan for Primary Diagnosis: Schizoaffective disorder, unspecified type (HCC) Long Term Goal(s):     Short Term Goals:    Medication Management: Evaluate patient's response, side effects, and tolerance of medication regimen.  Therapeutic Interventions: 1 to 1 sessions, Unit Group sessions and Medication administration.  Evaluation of Outcomes: Not Met  Physician Treatment Plan for Secondary Diagnosis: Principal Problem:   Schizoaffective disorder, unspecified type (HCC)  Long Term Goal(s):     Short Term Goals:       Medication Management: Evaluate patient's response, side effects, and tolerance of medication regimen.  Therapeutic Interventions: 1 to 1 sessions, Unit Group sessions and Medication administration.  Evaluation of Outcomes: Not Met   RN Treatment Plan for Primary Diagnosis: Schizoaffective disorder, unspecified type (HCC)  Long Term Goal(s): Knowledge of disease and therapeutic regimen to maintain health will improve  Short Term Goals: Ability to verbalize frustration and anger appropriately will improve, Ability to demonstrate self-control, Ability to participate in decision making will improve,  Ability to verbalize feelings will improve, Ability to disclose and discuss suicidal ideas, Ability to identify and develop effective coping behaviors will improve, and Compliance with prescribed medications will improve  Medication Management: RN will administer medications as ordered by provider, will assess and evaluate patient's response and provide education to patient for prescribed medication. RN will report any adverse and/or side effects to prescribing provider.  Therapeutic Interventions: 1 on 1 counseling sessions, Psychoeducation, Medication administration, Evaluate responses to treatment, Monitor vital signs and CBGs as ordered, Perform/monitor CIWA, COWS, AIMS and Fall Risk screenings as ordered, Perform wound care treatments as ordered.  Evaluation of Outcomes: Not Met   LCSW Treatment Plan for Primary Diagnosis: Schizoaffective disorder, unspecified type (HCC) Long Term Goal(s): Safe transition to appropriate next level of care at discharge, Engage patient in therapeutic group addressing interpersonal concerns.  Short Term Goals: Engage patient in aftercare planning with referrals and resources, Increase social support, Increase ability to appropriately verbalize feelings, Increase emotional regulation, Facilitate acceptance of mental health diagnosis and concerns, and Increase skills for wellness and recovery  Therapeutic Interventions: Assess for all discharge needs, 1 to 1 time with Social worker, Explore available resources and support systems, Assess for adequacy in community support network, Educate family and significant other(s) on suicide prevention, Complete Psychosocial Assessment, Interpersonal group therapy.  Evaluation of Outcomes: Not Met   Progress in Treatment: Attending groups: No. Participating in groups: No. Taking medication as prescribed: Yes. Toleration medication: Yes. Family/Significant other contact made: No, will contact:  once permission has been  granted Patient understands diagnosis: Yes. Discussing patient identified problems/goals with staff: Yes. Medical problems stabilized or resolved: Yes. Denies suicidal/homicidal ideation: Yes. Issues/concerns per patient self-inventory: No. Other: none  New problem(s) identified: No, Describe:  none  New Short Term/Long Term Goal(s): detox, elimination of symptoms of psychosis, medication management for mood stabilization; elimination of SI thoughts; development of comprehensive mental wellness/sobriety plan.   Patient Goals:  stay focused and learn to ask for help when needed  Discharge Plan or Barriers: CSW to assist the patient in development of appropriate discharge plans.   Reason for Continuation of Hospitalization: Anxiety Hallucinations Medication stabilization Suicidal ideation  Estimated Length of Stay:  1-7 days  Last 3 [place] Suicide Severity Risk Score: Flowsheet Row Admission (Current) from 12/07/2024 in [facility] INPATIENT [facility]AL MEDICINE ED from 12/06/2024 in [facility] Emergency Department at [facility] Counselor from 11/01/2024 in [facility] at [facility] RISK CATEGORY High Risk High Risk Moderate Risk    Last PHQ 2/9 Scores:    11/01/2024    4:00 PM 01/31/2024   10:53 AM 01/29/2024    6:50 AM  Depression screen PHQ 2/9  Decreased Interest 2 0 1  Down, Depressed, Hopeless 0 0 1  PHQ - 2 Score 2 0 2  Altered sleeping 1 0 0  Tired, decreased energy 3 1 0  Change in appetite 2 0 0  Feeling bad or failure about yourself  1 0 0  Trouble concentrating 3 1 1   Moving slowly or fidgety/restless 0 0 0  Suicidal thoughts 0 0 0  PHQ-9 Score 12 2  3    Difficult doing work/chores Somewhat difficult Not difficult at all Somewhat difficult  Data saved with a previous flowsheet row definition    Scribe for Treatment Team: Sherryle JINNY Margo, [place] 12/07/2024 12:00 PM

## 2024-12-07 NOTE — BHH Suicide Risk Assessment (Signed)
 [facility] Admission Suicide Risk Assessment   Nursing information obtained from:  Patient Demographic factors:  Male, Living alone, Low socioeconomic status, Unemployed Current Mental Status:  Suicidal ideation indicated by patient Loss Factors:  NA Historical Factors:  Prior suicide attempts Risk Reduction Factors:  Positive therapeutic relationship  Total Time spent with patient: 30 minutes Principal Problem: Schizoaffective disorder, unspecified type (HCC) Diagnosis:  Principal Problem:   Schizoaffective disorder, unspecified type (HCC)  Subjective Data: The patient presents to ED via POV with worsening auditory hallucinations commanding him to hurt self. Patient has Hx of treatment for Schizoaffective disorder, depressive type  Currently prescribed:risperidone , benztropine , trazodone . He reports Noncompliance in past 3 weeks as he reports losing Medicaid.Patient is admitted to  psych unit with Q15 min safety monitoring. Multidisciplinary team approach is offered. Medication management; group/milieu therapy is offered.   Continued Clinical Symptoms:  Alcohol Use Disorder Identification Test Final Score (AUDIT): 1 The Alcohol Use Disorders Identification Test, Guidelines for Use in Primary Care, Second Edition.  World Science Writer [facility]). Score between 0-7:  no or low risk or alcohol related problems. Score between 8-15:  moderate risk of alcohol related problems. Score between 16-19:  high risk of alcohol related problems. Score 20 or above:  warrants further diagnostic evaluation for alcohol dependence and treatment.   CLINICAL FACTORS:   Schizophrenia:   Command hallucinatons   Musculoskeletal: Strength & Muscle Tone: within normal limits Gait & Station: normal Patient leans: N/A  Psychiatric Specialty Exam:  Presentation  General Appearance:  Appropriate for Environment  Eye Contact: Minimal  Speech: Normal Rate  Speech  Volume: Decreased  Handedness: Right   Mood and Affect  Mood: Hopeless; Depressed; Anxious  Affect: Depressed; Flat   Thought Process  Thought Processes: Coherent  Descriptions of Associations:Intact  Orientation:Full (Time, Place and Person)  Thought Content:Logical  History of Schizophrenia/Schizoaffective disorder:Yes  Duration of Psychotic Symptoms:Greater than six months  Hallucinations:Hallucinations: Auditory; Visual  Ideas of Reference:Paranoia  Suicidal Thoughts:Suicidal Thoughts: Yes, Passive SI Passive Intent and/or Plan: Without Intent; Without Plan; Without Means to Carry Out  Homicidal Thoughts:Homicidal Thoughts: No   Sensorium  Memory: Immediate Fair; Recent Fair  Judgment: Impaired  Insight: Shallow   Executive Functions  Concentration: Fair  Attention Span: Fair  Recall: [employer] of Knowledge: Fair  Language: Fair   Psychomotor Activity  Psychomotor Activity: Psychomotor Activity: Normal   Assets  Assets: Communication Skills; Housing   Sleep  Sleep: Sleep: Fair    Physical Exam: Physical Exam ROS Blood pressure (!) 141/84, pulse 93, temperature 97.8 F (36.6 C), temperature source Oral, resp. rate 17, height 5' 11 (1.803 m), weight 86.2 kg. Body mass index is 26.5 kg/m.   COGNITIVE FEATURES THAT CONTRIBUTE TO RISK:  None    SUICIDE RISK:   Minimal: No identifiable suicidal ideation.  Patients presenting with no risk factors but with morbid ruminations; may be classified as minimal risk based on the severity of the depressive symptoms  PLAN OF CARE: Patient is admitted to  psych unit with Q15 min safety monitoring. Multidisciplinary team approach is offered. Medication management; group/milieu therapy is offered.   I certify that inpatient services furnished can reasonably be expected to improve the patient's condition.   Allyn Foil, MD 12/07/2024, 1:47 PM

## 2024-12-07 NOTE — Group Note (Signed)
 Date:  12/07/2024 Time:  11:12 AM  Group Topic/Focus:  Building Self Esteem:   The Focus of this group is helping patients become aware of the effects of self-esteem on their lives, the things they and others do that enhance or undermine their self-esteem, seeing the relationship between their level of self-esteem and the choices they make and learning ways to enhance self-esteem.    Participation Level:  Active  Participation Quality:  Appropriate  Affect:  Appropriate  Cognitive:  Appropriate  Insight: Appropriate  Engagement in Group:  Engaged  Modes of Intervention:  Activity  Additional Comments:    Damon [name] June 12/07/2024, 11:12 AM
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
## 2024-12-07 NOTE — Plan of Care (Signed)

## 2024-12-07 NOTE — Tx Team (Signed)
 Initial Treatment Plan 12/07/2024 7:00 AM Damon [name] FMW:[id number]    PATIENT STRESSORS: Medication change or noncompliance     PATIENT STRENGTHS: Ability for insight  Capable of independent living  Physical Health    PATIENT IDENTIFIED PROBLEMS: Auditory Hallucinations                     DISCHARGE CRITERIA:  Ability to meet basic life and health needs Improved stabilization in mood, thinking, and/or behavior  PRELIMINARY DISCHARGE PLAN: Return to previous living arrangement  PATIENT/FAMILY INVOLVEMENT: This treatment plan has been presented to and reviewed with the patient, Damon [name], and/or family member.  The patient and family have been given the opportunity to ask questions and make suggestions.  Damon DELENA Carte, RN 12/07/2024, 7:00 AM

## 2024-12-07 NOTE — Progress Notes (Signed)
 Pt A&Ox4. Pt endorses passive SI with no plan. He reports auditory hallucinations that he describes as scrambled. He denies command hallucinations at this time. He denies HI and visual hallucinations. He reports a HA 9/10. On-call MD notified but no answer. Pt has a scar in the middle of his chest. No contraband found. Pt encouraged to come to staff with worsening of symptoms and thoughts of SI. He verbally contracts to safety. Q 15 min safety checks in place.

## 2024-12-07 NOTE — ED Provider Notes (Signed)
 Psychiatry consult is appreciated, patient needs inpatient psychiatric care.  He has been accepted at [facility] behavioral health by Dr. Layne.   Raford Lenis, MD 12/07/24 [phone]

## 2024-12-07 NOTE — Group Note (Signed)
 Recreation Therapy Group Note   Group Topic:Leisure Education  Group Date: 12/07/2024 Start Time: 1035 End Time: 1130 Facilitators: Celestia Jeoffrey FORBES ARTICE, CTRS Location: Craft Room  Group Description: Leisure. Patients were given the option to choose from journaling, coloring, drawing, making origami, playing with playdoh, listening to music or singing karaoke. LRT and pts discussed the meaning of leisure, the importance of participating in leisure during their free time/when they're outside of the hospital, as well as how our leisure interests can also serve as coping skills.   Goal Area(s) Addressed:  Patient will identify a current leisure interest.  Patient will learn the definition of leisure. Patient will practice making a positive decision. Patient will have the opportunity to try a new leisure activity. Patient will communicate with peers and LRT.    Affect/Mood: N/A   Participation Level: Did not attend    Clinical Observations/Individualized Feedback: Patient did not attend.   Plan: Continue to engage patient in RT group sessions 2-3x/week.   Jeoffrey FORBES Celestia, LRT, CTRS 12/07/2024 12:48 PM

## 2024-12-07 NOTE — ED Notes (Signed)
TTS being performed.  
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
## 2024-12-07 NOTE — Group Note (Signed)
 Recreation Therapy Group Note   Group Topic:Health and Wellness  Group Date: 12/07/2024 Start Time: 1530 End Time: 1610 Facilitators: Celestia Jeoffrey BRAVO, LRT, CTRS Location: Courtyard  Group Description: [employer]. LRT and patients played games of basketball, drew with chalk, and played corn hole while outside in the courtyard while getting fresh air and sunlight. Music was being played in the background. LRT and peers conversed about different games they have played before, what they do in their free time and anything else that is on their minds. LRT encouraged pts to drink water after being outside, sweating and getting their heart rate up.  Goal Area(s) Addressed: Patient will build on frustration tolerance skills. Patients will partake in a competitive play game with peers. Patients will gain knowledge of new leisure interest/hobby.    Affect/Mood: Appropriate   Participation Level: Active and Engaged   Participation Quality: Independent   Behavior: Calm and Cooperative   Speech/Thought Process: Coherent   Insight: Fair   Judgement: Fair    Modes of Intervention: Activity, Exploration, and Music   Patient Response to Interventions:  Attentive, Engaged, and Receptive   Education Outcome:  Acknowledges education   Clinical Observations/Individualized Feedback: Cheryle was active in their participation of session activities and group discussion. Pt interacted well with LRT and peers duration of session.    Plan: Continue to engage patient in RT group sessions 2-3x/week.   [address] LRT, CTRS 12/07/2024 5:02 PM

## 2024-12-07 NOTE — [facility] Suicide Risk Assessment (Signed)
 [facility] INPATIENT:  Family/Significant Other Suicide Prevention Education  Suicide Prevention Education:  Contact Attempts:  Jaquarius Seder, partner, [phone], (name of family member/significant other) has been identified by the patient as the family member/significant other with whom the patient will be residing, and identified as the person(s) who will aid the patient in the event of a mental health crisis.  With written consent from the patient, two attempts were made to provide suicide prevention education, prior to and/or following the patient's discharge.  We were unsuccessful in providing suicide prevention education.  A suicide education pamphlet was given to the patient to share with family/significant other.  Date and time of first attempt: 12/07/2024 at 2:47 PM Date and time of second attempt: Second attempt is needed.  Call unable to be completed, phone may not be in working order.   Sherryle JINNY Margo 12/07/2024, 2:45 PM

## 2024-12-07 NOTE — ED Notes (Signed)
 Belongings given to Safe transport, Pt calm and cooperative at departure

## 2024-12-07 NOTE — Progress Notes (Signed)
" °   12/07/24 1100  Psych Admission Type (Psych Patients Only)  Admission Status Voluntary  Psychosocial Assessment  Patient Complaints Depression;Anxiety (pt misses home)  Eye Contact Fair  Facial Expression Flat  Affect Appropriate to circumstance  Speech Logical/coherent  Interaction Assertive  Motor Activity Slow  Appearance/Hygiene In scrubs  Behavior Characteristics Cooperative;Appropriate to situation  Mood Pleasant  Thought Process  Coherency WDL  Content WDL  Delusions None reported or observed  Perception Hallucinations  Hallucination Auditory;Visual  Judgment Impaired  Confusion None  Danger to Self  Current suicidal ideation? Denies  Agreement Not to Harm Self Yes  Description of Agreement verbal  Danger to Others  Danger to Others None reported or observed    "

## 2024-12-08 MED ORDER — BENAZEPRIL HCL 20 MG PO TABS
20.0000 mg | ORAL_TABLET | Freq: Every day | ORAL | Status: DC
  Filled 2024-12-08: qty 1

## 2024-12-08 MED ORDER — ACETAMINOPHEN 500 MG PO TABS
1000.0000 mg | ORAL_TABLET | Freq: Four times a day (QID) | ORAL | Status: DC | PRN
  Administered 2024-12-08 – 2024-12-12 (×4): 1000 mg via ORAL
  Filled 2024-12-08 (×4): qty 2

## 2024-12-08 MED ORDER — AMLODIPINE BESYLATE 5 MG PO TABS: 5.0000 mg | ORAL_TABLET | Freq: Every day | ORAL | Status: DC

## 2024-12-08 MED ORDER — HYDROCHLOROTHIAZIDE 25 MG PO TABS
25.0000 mg | ORAL_TABLET | Freq: Every day | ORAL | Status: DC
  Administered 2024-12-09 – 2024-12-13 (×4): 25 mg via ORAL
  Filled 2024-12-08 (×6): qty 1

## 2024-12-08 MED ORDER — HYDRALAZINE HCL 25 MG PO TABS
25.0000 mg | ORAL_TABLET | Freq: Three times a day (TID) | ORAL | Status: DC | PRN
  Administered 2024-12-08: 25 mg via ORAL
  Filled 2024-12-08 (×3): qty 1

## 2024-12-08 MED ORDER — HYDRALAZINE HCL 50 MG PO TABS
50.0000 mg | ORAL_TABLET | Freq: Three times a day (TID) | ORAL | Status: DC
  Administered 2024-12-08: 50 mg via ORAL
  Filled 2024-12-08 (×2): qty 1

## 2024-12-08 MED ORDER — HYDRALAZINE HCL 50 MG PO TABS
50.0000 mg | ORAL_TABLET | Freq: Three times a day (TID) | ORAL | Status: DC | PRN
  Administered 2024-12-08 – 2024-12-09 (×2): 50 mg via ORAL
  Filled 2024-12-08 (×2): qty 1

## 2024-12-08 NOTE — [facility] Suicide Risk Assessment (Signed)
 [facility] INPATIENT:  Family/Significant Other Suicide Prevention Education  Suicide Prevention Education:  Contact Attempts: 1st, Juddson Cobern partner has been identified by the patient as the family member/significant other with whom the patient will be residing, and identified as the person(s) who will aid the patient in the event of a mental health crisis.  With written consent from the patient, two attempts were made to provide suicide prevention education, prior to and/or following the patient's discharge.  We were unsuccessful in providing suicide prevention education.  A suicide education pamphlet was given to the patient to share with family/significant other.  Date and time of first attempt:[id number] /12:00pm Date and time of second attempt: Pamila Nine 12/08/2024, 11:58 AM

## 2024-12-08 NOTE — Progress Notes (Signed)
" °   12/08/24 0306  Psych Admission Type (Psych Patients Only)  Admission Status Voluntary  Psychosocial Assessment  Patient Complaints Anxiety;Depression  Eye Contact Fair  Facial Expression Flat  Affect Appropriate to circumstance  Speech Logical/coherent  Interaction Assertive  Motor Activity Slow  Appearance/Hygiene Unremarkable  Behavior Characteristics Cooperative;Appropriate to situation;Calm  Mood Pleasant  Aggressive Behavior  Effect No apparent injury  Thought Process  Coherency WDL  Content WDL  Delusions None reported or observed  Perception Hallucinations  Hallucination None reported or observed  Judgment Impaired  Confusion None  Danger to Self  Current suicidal ideation? Denies  Self-Injurious Behavior No self-injurious ideation or behavior indicators observed or expressed   Agreement Not to Harm Self Yes  Description of Agreement Verbal  Danger to Others  Danger to Others None reported or observed    "

## 2024-12-08 NOTE — Plan of Care (Signed)

## 2024-12-08 NOTE — Plan of Care (Addendum)
 Pt A & O X4. Denies SI and AVH when assessed Not right this moment. I've been having headaches though. Pt reports fair sleep due to safety checks last night but with good appetite. Assigned provider made aware of pt's elevated BP with systolic ranges from 155-155 and diastolic ranges from 97-111 with symptoms of headaches and lightheadedness. New order received for PRN Hydralazine  25 mg Q 8 hours and a consult for the hospitalist. Pt remains medication compliant, attended scheduled groups and is cooperative with care. Safety checks maintained at Q 15 minutes intervals without outburst. Emotional support, encouragement and reassurance offered.   Problem: Health Behavior/Discharge Planning: Goal: Compliance with treatment plan for underlying cause of condition will improve Outcome: Progressing   Problem: Physical Regulation: Goal: Ability to maintain clinical measurements within normal limits will improve Outcome: Not Progressing

## 2024-12-08 NOTE — Consult Note (Addendum)
 Initial Consultation Note   Patient: Damon [name] FMW:[id number] DOB: 10/15/1966 PCP: Claudene Prentice DELENA Mickey., FNP DOA: 12/07/2024 DOS: the patient was seen and examined on 12/08/2024 Primary service: Donnelly Mellow, MD  Referring physician: Dr. Donnelly Reason for consult: Hypertension  Assessment/Plan: Hypertension: Pt with hx of HTN who has not taken any blood pressure medications for 5-6 months. He states he was started on medications 2 days ago. Denies any chest pain, shortness of breath. States has mild headache which has been improving. Was started on benazepril  10 mg every day, amlodipine  10 mg and hydralazine  25 mg prn by psychiatry team based on remote prescription. Will change his regimen to amlodipine  5 mg every day, benzapril 20 mg every day, hydrochlorothiazide  25 mg every day. Will change his hydralazine  to 50 mg q8hrs prn.      Schizoaffective disorder/MDD: continue treatment per psychiatry team.    TRH will continue to follow the patient.  HPI: Damon [name] is a 59 y.o. male with past medical history of schizoaffective disorder, HTN, MDD presenting to the ED for [facility] with command hallucinations. He is admitted to behavioral health unit. IM consulted for HTN. Pt reports not taking his BP medications for 5-6 months. Denies any symptoms.    Review of Systems: As mentioned in the history of present illness. All other systems reviewed and are negative. Past Medical History:  Diagnosis Date   Depression    Hypertension    Paranoid schizophrenia (HCC)    Schizophrenia (HCC)    History reviewed. No pertinent surgical history. Social History:  reports that he has been smoking cigarettes. He uses smokeless tobacco. He reports current alcohol use of about 1.0 standard drink of alcohol per week. He reports that he does not currently use drugs after having used the following drugs: Marijuana.  Allergies[1]  Family History  Problem Relation Age of Onset   Colon cancer Mother     Hyperlipidemia Father    Heart failure Brother    Diabetes Other    Stroke Other     Prior to Admission medications  Medication Sig Start Date End Date Taking? Authorizing Provider  amLODipine  (NORVASC ) 10 MG tablet Take 1 tablet (10 mg total) by mouth daily. For hypertension Patient not taking: Reported on 12/06/2024 07/13/24   Collene Gouge I, NP  benazepril  (LOTENSIN ) 10 MG tablet Take 1 tablet (10 mg total) by mouth daily. For hypertension. Patient not taking: Reported on 12/06/2024 07/13/24   Collene Gouge I, NP  benztropine  (COGENTIN ) 1 MG tablet Take 1 tablet (1 mg total) by mouth 2 (two) times daily. For prevention of antipsychotic side effects. Patient not taking: Reported on 12/06/2024 07/13/24   Collene Gouge I, NP  hydrOXYzine  (ATARAX ) 25 MG tablet Take 1 tablet (25 mg total) by mouth 3 (three) times daily as needed for anxiety. Patient not taking: Reported on 12/06/2024 07/13/24   Collene Gouge I, NP  Multiple Vitamins-Minerals (MULTIVITAMIN MEN 50+) TABS Take 1 tablet by mouth daily. Patient not taking: Reported on 12/06/2024    [provider]  nicotine  (NICODERM CQ  - DOSED IN MG/24 HOURS) 21 mg/24hr patch Place 1 patch (21 mg total) onto the skin daily as needed. (May buy from over there counter): For smoking cessation. 07/13/24   Collene Gouge I, NP  Nutritional Supplements (ENSURE ORIGINAL) LIQD Take 1 Bottle by mouth 2 (two) times daily as needed (poor PO intake).    [provider]  risperiDONE  (RISPERDAL ) 0.5 MG tablet Take 3 tablets (1.5  mg total) by mouth 2 (two) times daily. For mood control Patient not taking: Reported on 12/06/2024 07/13/24   Collene Gouge I, NP  traZODone  (DESYREL ) 50 MG tablet Take 1 tablet (50 mg total) by mouth at bedtime. For sleep Patient not taking: Reported on 12/06/2024 07/13/24   Collene Gouge I, NP  Vitamin D , Ergocalciferol , (DRISDOL ) 1.25 MG (50000 UNIT) CAPS capsule Take 1 capsule (50,000 Units total) by mouth every 7 (seven) days. For  bone health. Patient not taking: Reported on 12/06/2024 07/19/24   Collene Gouge I, NP    Physical Exam: Vitals:   12/08/24 1241 12/08/24 1603 12/08/24 1636 12/08/24 1655  BP: (!) 155/97  (!) 162/94 (!) 143/100  Pulse: 96 74 (!) 103 (!) 106  Resp:      Temp:      TempSrc:      SpO2:  (!) 82% 100% 100%  Weight:      Height:        Data Reviewed:   There are no new results to review at this time.    Family Communication: Updated at bedside Primary team communication:  Thank you very much for involving us  in the care of your patient.  Author: Morene Bathe, MD 12/08/2024 6:55 PM  For on call review [email or web address].      [1]  Allergies Allergen Reactions   Spinach Hives   Zofran  [Ondansetron ] Rash

## 2024-12-08 NOTE — Group Note (Signed)
 Date:  12/08/2024 Time:  8:50 PM  Group Topic/Focus:  Managing Feelings:   The focus of this group is to identify what feelings patients have difficulty handling and develop a plan to handle them in a healthier way upon discharge.    Pt did not attend group.  Krissy Orebaugh L 12/08/2024, 8:50 PM

## 2024-12-08 NOTE — Group Note (Signed)
 Date:  12/08/2024 Time:  3:28 AM  Group Topic/Focus:  Emotional Education:   The focus of this group is to discuss what feelings/emotions are, and how they are experienced. Managing Feelings:   The focus of this group is to identify what feelings patients have difficulty handling and develop a plan to handle them in a healthier way upon discharge. Wrap-Up Group:   The focus of this group is to help patients review their daily goal of treatment and discuss progress on daily workbooks.    Participation Level:  Active  Participation Quality:  Appropriate and Attentive  Affect:  Appropriate  Cognitive:  Alert, Appropriate, and Oriented  Insight: Appropriate and Good  Engagement in Group:  Engaged  Modes of Intervention:  Discussion and Support  Additional Comments:  N/A  Damon [name] 12/08/2024, 3:28 AM

## 2024-12-08 NOTE — Progress Notes (Cosign Needed Addendum)
 Patient ID: Damon [name], male   DOB: [birth year], 59 y.o.   MRN: [id number] [facility] MD Progress Note  12/08/2024 10:16 AM Damon [name]  MRN:  [id number]   The patient presents to ED via POV with worsening auditory hallucinations commanding him to hurt self. Patient has Hx of treatment for Schizoaffective disorder, depressive type  Currently prescribed:risperidone , benztropine , trazodone . He reports Noncompliance in past 3 weeks as he reports losing Medicaid.Patient is admitted to  psych unit with Q15 min safety monitoring. Multidisciplinary team approach is offered. Medication management; group/milieu therapy is offered.   Subjective:  Chart reviewed, case discussed in multidisciplinary meeting, patient seen during rounds.   12/08/2024: Patient was seen laying in bed this morning for psychiatric reassessment during clinical rounds. Patient is alert and oriented X 4, calm, cooperative, and engages well in the interview. Patient endorses he can see the image of the voices, but he can't hear therm. Patient remains disorganized, delusional, and has auditory and visual hallucinations. Patient denies depression, anxiety, rates both a 0 out of 10. He reports eating well, but is having difficulty with sleeping due to complaints of back pain. He endorses he is experiencing complaints of head and back pain impacting him throughout he day and night. He rates his pain as a 6 to 7 out of 10. He was noted to have elevated BP and was informed that the Hospitalist team will be notified to evaluate him. Patient is currently taking Amlodipine  and Benazepril  for HTN, but Hydralazine  25 mg PRN was added to his regimen this morning as well. Patient denies adverse reactions to his medications, suicidal or homicidal ideations, or perceptual disturbances today.    Past Psychiatric History: see h&P Family History:  Family History  Problem Relation Age of Onset   Colon cancer Mother    Hyperlipidemia Father    Heart  failure Brother    Diabetes Other    Stroke Other    Social History:  Social History   Substance and Sexual Activity  Alcohol Use Yes   Alcohol/week: 1.0 standard drink of alcohol   Types: 1 Cans of beer per week   Comment: one can of beer weekly     Social History   Substance and Sexual Activity  Drug Use Not Currently   Types: Marijuana    Social History   Socioeconomic History   Marital status: Single    Spouse name: Not on file   Number of children: Not on file   Years of education: Not on file   Highest education level: Not on file  Occupational History   Not on file  Tobacco Use   Smoking status: Every Day    Types: Cigarettes   Smokeless tobacco: Current  Vaping Use   Vaping status: Never Used  Substance and Sexual Activity   Alcohol use: Yes    Alcohol/week: 1.0 standard drink of alcohol    Types: 1 Cans of beer per week    Comment: one can of beer weekly   Drug use: Not Currently    Types: Marijuana   Sexual activity: Yes    Birth control/protection: Condom  Other Topics Concern   Not on file  Social History Narrative   ** Merged History Encounter **       Social Drivers of Health   Tobacco Use: High Risk (12/07/2024)   Patient History    Smoking Tobacco Use: Every Day    Smokeless Tobacco Use: Current    Passive Exposure: Not on  file  Financial Resource Strain: Low Risk (01/13/2023)   Overall Financial Resource Strain (CARDIA)    Difficulty of Paying Living Expenses: Not hard at all  Food Insecurity: Food Insecurity Present (12/07/2024)   Epic    Worried About Programme Researcher, Broadcasting/film/video in the Last Year: Sometimes true    Ran Out of Food in the Last Year: Never true  Transportation Needs: No Transportation Needs (12/07/2024)   Epic    Lack of Transportation (Medical): No    Lack of Transportation (Non-Medical): No  Physical Activity: Sufficiently Active (01/13/2023)   Exercise Vital Sign    Days of Exercise per Week: 5 days    Minutes of Exercise  per Session: 60 min  Stress: Stress Concern Present (01/13/2023)   [employer] of Occupational Health - Occupational Stress Questionnaire    Feeling of Stress : To some extent  Social Connections: Socially Isolated (05/11/2024)   Social Connection and Isolation Panel    Frequency of Communication with Friends and Family: Twice a week    Frequency of Social Gatherings with Friends and Family: Once a week    Attends Religious Services: Never    Database Administrator or Organizations: No    Attends Banker Meetings: Never    Marital Status: Never married  Depression (PHQ2-9): High Risk (11/01/2024)   Depression (PHQ2-9)    PHQ-2 Score: 12  Alcohol Screen: Low Risk (12/07/2024)   Alcohol Screen    Last Alcohol Screening Score (AUDIT): 1  Housing: Low Risk (12/07/2024)   Epic    Unable to Pay for Housing in the Last Year: No    Number of Times Moved in the Last Year: 0    Homeless in the Last Year: No  Utilities: Not At Risk (12/07/2024)   Epic    Threatened with loss of utilities: No  Health Literacy: Not on file   Past Medical History:  Past Medical History:  Diagnosis Date   Depression    Hypertension    Paranoid schizophrenia (HCC)    Schizophrenia (HCC)    History reviewed. No pertinent surgical history.  Current Medications: Current Facility-Administered Medications  Medication Dose Route Frequency Provider Last Rate Last Admin   amLODipine  (NORVASC ) tablet 10 mg  10 mg Oral Daily Jadapalle, Sree, MD   10 mg at 12/08/24 9166   benazepril  (LOTENSIN ) tablet 10 mg  10 mg Oral Daily Jadapalle, Sree, MD   10 mg at 12/08/24 9164   multivitamin with minerals tablet 1 tablet  1 tablet Oral Daily Jadapalle, Sree, MD   1 tablet at 12/08/24 9166   nicotine  (NICODERM CQ  - dosed in mg/24 hours) patch 21 mg  21 mg Transdermal Daily PRN Jadapalle, Sree, MD   21 mg at 12/07/24 1824   OLANZapine  (ZYPREXA ) injection 5 mg  5 mg Intramuscular TID PRN Onuoha, Chinwendu V, NP        OLANZapine  zydis (ZYPREXA ) disintegrating tablet 5 mg  5 mg Oral TID PRN Onuoha, Chinwendu V, NP       risperiDONE  (RISPERDAL ) tablet 1.5 mg  1.5 mg Oral BID Jadapalle, Sree, MD   1.5 mg at 12/08/24 9166   traZODone  (DESYREL ) tablet 50 mg  50 mg Oral QHS Jadapalle, Sree, MD   50 mg at 12/07/24 2118   [START ON 12/09/2024] Vitamin D  (Ergocalciferol ) (DRISDOL ) 1.25 MG (50000 UNIT) capsule 50,000 Units  50,000 Units Oral Q7 days Donnelly Mellow, MD        Lab Results:  Results  for orders placed or performed during the hospital encounter of 12/06/24 (from the past 48 hours)  Rapid urine drug screen (hospital performed)     Status: None   Collection Time: 12/06/24 10:22 AM  Result Value Ref Range   Opiates NEGATIVE NEGATIVE   Cocaine NEGATIVE NEGATIVE   Benzodiazepines NEGATIVE NEGATIVE   Amphetamines NEGATIVE NEGATIVE   Tetrahydrocannabinol NEGATIVE NEGATIVE   Barbiturates NEGATIVE NEGATIVE   Methadone Scn, Ur NEGATIVE NEGATIVE   Fentanyl  NEGATIVE NEGATIVE    Comment: (NOTE) Drug screen is for Medical Purposes only. Positive results are preliminary only. If confirmation is needed, notify lab within 5 days.  Drug Class                 Cutoff (ng/mL) Amphetamine and metabolites 1000 Barbiturate and metabolites 200 Benzodiazepine              200 Opiates and metabolites     300 Cocaine and metabolites     300 THC                         50 Fentanyl                     5 Methadone                   300  Trazodone  is metabolized in vivo to several metabolites,  including pharmacologically active m-CPP, which is excreted in the  urine.  Immunoassay screens for amphetamines and MDMA have potential  cross-reactivity with these compounds and may provide false positive  result.  Performed at [facility], [address], [place], [place] [address]     Blood Alcohol level:  Lab Results  Component Value Date   [facility] <15 12/06/2024   ETH <15 07/10/2024    Metabolic Disorder  Labs: Lab Results  Component Value Date   HGBA1C 5.6 07/10/2024   MPG 114.02 07/10/2024   MPG 111.15 05/10/2024   Lab Results  Component Value Date   PROLACTIN 4.6 05/10/2024   PROLACTIN 55.0 (H) 10/13/2017   Lab Results  Component Value Date   CHOL 233 (H) 07/10/2024   TRIG 151 (H) 07/10/2024   HDL 101 07/10/2024   CHOLHDL 2.3 07/10/2024   VLDL 30 07/10/2024   LDLCALC 102 (H) 07/10/2024   LDLCALC 107 (H) 05/10/2024    Physical Findings: AIMS:  , ,  ,  ,    CIWA:    COWS:      Psychiatric Specialty Exam:  Presentation  General Appearance:  Appropriate for Environment  Eye Contact: Minimal  Speech: Normal Rate  Speech Volume: Decreased    Mood and Affect  Mood: Hopeless; Depressed; Anxious  Affect: Depressed; Flat   Thought Process  Thought Processes: Coherent  Orientation:Full (Time, Place and Person)  Thought Content:Logical  Hallucinations:Hallucinations: Auditory; Visual  Ideas of Reference:Paranoia  Suicidal Thoughts:Suicidal Thoughts: Yes, Passive SI Passive Intent and/or Plan: Without Intent; Without Plan; Without Means to Carry Out  Homicidal Thoughts:Homicidal Thoughts: No   Sensorium  Memory: Immediate Fair; Recent Fair  Judgment: Impaired  Insight: Shallow   Executive Functions  Concentration: Fair  Attention Span: Fair  Recall: [employer] of Knowledge: Fair  Language: Fair   Psychomotor Activity  Psychomotor Activity: Psychomotor Activity: Normal  Musculoskeletal: Strength & Muscle Tone: within normal limits Gait & Station: normal Assets  Assets: Manufacturing Systems Engineer; Housing    Physical Exam: Physical Exam ROS Blood  pressure (!) 158/104, pulse (!) 104, temperature 97.9 F (36.6 C), temperature source Oral, resp. rate 18, height 5' 11 (1.803 m), weight 86.2 kg, SpO2 99%. Body mass index is 26.5 kg/m.  Diagnosis: Principal Problem:   Schizoaffective disorder, unspecified type  (HCC)   PLAN: Safety and Monitoring:  -- Voluntary admission to inpatient psychiatric unit for safety, stabilization and treatment  -- Daily contact with patient to assess and evaluate symptoms and progress in treatment  -- Patient's case to be discussed in multi-disciplinary team meeting  -- Observation Level : q15 minute checks  -- Vital signs:  q12 hours  -- Precautions: suicide, elopement, and assault -- Encouraged patient to participate in unit milieu and in scheduled group therapies  2. Psychiatric Treatment:  Scheduled Medications:  -Risperidone  1.5  mg   -Trazodone  50 mg    -- The risks/benefits/side-effects/alternatives to this medication were discussed in detail with the patient and time was given for questions. The patient consents to medication trial.  3. Medical Issues Being Addressed:   Hydralazine  25 mg every 8 hours PRN HTN   4. Discharge Planning:   -- Social work and case management to assist with discharge planning and identification of hospital follow-up needs prior to discharge  -- Estimated LOS: 3-4 days  Camelia JINNY Mountain, NP 12/08/2024, 10:16 AM

## 2024-12-08 NOTE — Group Note (Signed)
 Date:  12/08/2024 Time:  5:09 PM  Group Topic/Focus:  Goals Group:   The focus of this group is to help patients establish daily goals to achieve during treatment and discuss how the patient can incorporate goal setting into their daily lives to aide in recovery.    Participation Level:  Active  Participation Quality:  Attentive  Affect:  Appropriate  Cognitive:  Appropriate  Insight: Appropriate  Engagement in Group:  Engaged  Modes of Intervention:  Activity  Additional Comments:    Damon [name] June 12/08/2024, 5:09 PM

## 2024-12-08 NOTE — Group Note (Signed)
 Date:  12/08/2024 Time:  5:33 PM  Group Topic/Focus:  Orientation:   The focus of this group is to educate the patient on the purpose and policies of crisis stabilization and provide a format to answer questions about their admission.  The group details unit policies and expectations of patients while admitted.    Participation Level:  Active  Participation Quality:  Appropriate  Affect:  Appropriate  Cognitive:  Appropriate  Insight: Appropriate  Engagement in Group:  Engaged  Modes of Intervention:  Activity  Additional Comments:    Damon [name] June 12/08/2024, 5:33 PM

## 2024-12-09 DIAGNOSIS — F259 Schizoaffective disorder, unspecified: Secondary | ICD-10-CM | POA: Diagnosis not present

## 2024-12-09 MED ORDER — AMLODIPINE BESYLATE 5 MG PO TABS
10.0000 mg | ORAL_TABLET | Freq: Every day | ORAL | Status: DC
  Administered 2024-12-09 – 2024-12-13 (×4): 10 mg via ORAL
  Filled 2024-12-09 (×5): qty 2

## 2024-12-09 MED ORDER — BENAZEPRIL HCL 20 MG PO TABS
40.0000 mg | ORAL_TABLET | Freq: Every day | ORAL | Status: DC
  Administered 2024-12-09 – 2024-12-13 (×4): 40 mg via ORAL
  Filled 2024-12-09 (×5): qty 2

## 2024-12-09 MED ORDER — CLONIDINE HCL 0.1 MG PO TABS
0.1000 mg | ORAL_TABLET | Freq: Two times a day (BID) | ORAL | Status: DC
  Administered 2024-12-09 – 2024-12-13 (×7): 0.1 mg via ORAL
  Filled 2024-12-09 (×8): qty 1

## 2024-12-09 MED ORDER — RISPERIDONE 1 MG PO TABS
2.0000 mg | ORAL_TABLET | Freq: Two times a day (BID) | ORAL | Status: DC
  Administered 2024-12-09 – 2024-12-13 (×7): 2 mg via ORAL
  Filled 2024-12-09 (×8): qty 2

## 2024-12-09 NOTE — Progress Notes (Signed)
" °   12/09/24 0908 12/09/24 1209 12/09/24 1210  Vital Signs  BP (!) 175/96 (!) 167/103 (!) 168/91    12/09/24 1230 12/09/24 1612  Vital Signs  BP (!) 160/80 (!) 174/106   1000 Notified Daine Ober NP of aware of BP 175/95. RN administered scheduled Bp meds. No new orders will continue to monitor. 1210 rechecked BP manually notified Tracie NP and MD Jadapalle during progression. New orders added additional BP meds.  1621 notified Daine Ober NP, Rn will give new scheduled BP meds and continue to monitor.     "

## 2024-12-09 NOTE — Plan of Care (Signed)
   Problem: Education: Goal: Mental status will improve Outcome: Not Progressing

## 2024-12-09 NOTE — Progress Notes (Addendum)
 " PROGRESS NOTE    Damon [name]  FMW:[id number] DOB: 28-Jun-1966 DOA: 12/07/2024 PCP: Claudene Prentice DELENA Mickey., FNP    Assessment & Plan:   Principal Problem:   Schizoaffective disorder, unspecified type (HCC)  Assessment and Plan:  HTN:  has not taken any blood pressure medications for 5-6 months. Continue on amlodipine , benazepril , HCTZ. Increased dosage of amlodipine  & benazepril . Hydralazine  prn. BP is slowly trending down    Schizoaffective disorder/MDD: continue treatment per psychiatry team.           DVT prophylaxis: ambulating often  Code Status: full  Family Communication:  Disposition Plan: as per primary team   Level of care:  Consultants:  Hospitalist   Procedures:   Antimicrobials:   Subjective: Pt c/o fatigue   Objective: Vitals:   12/08/24 2100 12/08/24 2104 12/09/24 0606 12/09/24 0636  BP: (!) 171/113 (!) 171/113 (!) 177/111 (!) 177/111  Pulse: 91  99   Resp: 18  18   Temp:   98.2 F (36.8 C)   TempSrc:   Oral   SpO2: 100%  100%   Weight:      Height:       No intake or output data in the 24 hours ending 12/09/24 0835 Filed Weights   12/07/24 0638  Weight: 86.2 kg    Examination:  General exam: Appears calm and comfortable  Respiratory system: Clear to auscultation. Respiratory effort normal. Cardiovascular system: S1 & S2 +. No rubs, gallops or clicks.  Gastrointestinal system: Abdomen is nondistended, soft and nontender. Normal bowel sounds heard. Central nervous system: Alert and oriented.  Psychiatry: Judgement and insight appears at baseline. Flat mood and affect    Data Reviewed: I have personally reviewed following labs and imaging studies  CBC: Recent Labs  Lab 12/06/24 1016  WBC 5.4  HGB 14.2  HCT 41.5  MCV 92.2  PLT 333   Basic Metabolic Panel: Recent Labs  Lab 12/06/24 1016  NA 138  K 4.2  CL 103  CO2 20*  GLUCOSE 98  BUN 9  CREATININE 1.12  CALCIUM 9.2   GFR: Estimated Creatinine Clearance: 75.6  mL/min (by C-G formula based on SCr of 1.12 mg/dL). Liver Function Tests: Recent Labs  Lab 12/06/24 1016  AST 32  ALT 24  ALKPHOS 58  BILITOT 0.4  PROT 7.5  ALBUMIN 4.5   No results for input(s): LIPASE, AMYLASE in the last 168 hours. No results for input(s): AMMONIA in the last 168 hours. Coagulation Profile: No results for input(s): INR, PROTIME in the last 168 hours. Cardiac Enzymes: No results for input(s): CKTOTAL, CKMB, CKMBINDEX, TROPONINI in the last 168 hours. BNP (last 3 results) No results for input(s): PROBNP in the last 8760 hours. HbA1C: No results for input(s): HGBA1C in the last 72 hours. CBG: No results for input(s): GLUCAP in the last 168 hours. Lipid Profile: No results for input(s): CHOL, HDL, LDLCALC, TRIG, CHOLHDL, LDLDIRECT in the last 72 hours. Thyroid  Function Tests: No results for input(s): TSH, T4TOTAL, FREET4, T3FREE, THYROIDAB in the last 72 hours. Anemia Panel: No results for input(s): VITAMINB12, FOLATE, FERRITIN, TIBC, IRON, RETICCTPCT in the last 72 hours. Sepsis Labs: No results for input(s): PROCALCITON, LATICACIDVEN in the last 168 hours.  No results found for this or any previous visit (from the past 240 hours).       Radiology Studies: No results found.      Scheduled Meds:  amLODipine   10 mg Oral Daily   benazepril   40  mg Oral Daily   hydrochlorothiazide   25 mg Oral Daily   multivitamin with minerals  1 tablet Oral Daily   risperiDONE   1.5 mg Oral BID   traZODone   50 mg Oral QHS   Vitamin D  (Ergocalciferol )  50,000 Units Oral Q7 days   Continuous Infusions:   LOS: 2 days      Anthony CHRISTELLA Pouch, MD [facility] Pager [phone]  If 7PM-7AM, please contact night-coverage [email or web address] Password [facility] 12/09/2024, 8:35 AM   "

## 2024-12-09 NOTE — Group Note (Signed)
 Date:  12/09/2024 Time:  11:53 AM  Group Topic/Focus:  Goals Group:   The focus of this group is to help patients establish daily goals to achieve during treatment and discuss how the patient can incorporate goal setting into their daily lives to aide in recovery.    Participation Level:  Active  Participation Quality:  Appropriate  Affect:  Appropriate  Cognitive:  Appropriate  Insight: Appropriate  Engagement in Group:  Engaged  Modes of Intervention:  Activity  Additional Comments:    Camellia HERO Heyden Jaber 12/09/2024, 11:53 AM

## 2024-12-09 NOTE — Progress Notes (Signed)
" °   12/09/24 1822  Vital Signs  BP (!) 140/80  BP Method Manual   Reassessed BP notified Tracie Hamption Np no new orders   "

## 2024-12-09 NOTE — Group Note (Signed)
 Date:  12/09/2024 Time:  8:33 PM    Additional Comments:  Did not attend group.  Damon [name] 12/09/2024, 8:33 PM

## 2024-12-09 NOTE — Progress Notes (Signed)
 Patient ID: ATLAS CROSSLAND, male   DOB: 22-Feb-1966, 59 y.o.   MRN: [id number] [facility] MD Progress Note  12/09/2024 10:11 PM TERI DILTZ  MRN:  [id number]   The patient presents to ED via POV with worsening auditory hallucinations commanding him to hurt self. Patient has Hx of treatment for Schizoaffective disorder, depressive type  Currently prescribed:risperidone , benztropine , trazodone . He reports Noncompliance in past 3 weeks as he reports losing Medicaid.Patient is admitted to  psych unit with Q15 min safety monitoring. Multidisciplinary team approach is offered. Medication management; group/milieu therapy is offered.    Subjective:  Chart reviewed, case discussed in multidisciplinary meeting, patient seen during rounds.    12/09/2024: Patient is assessed today in the group room and privacy is protected. He is alert and oriented x4, calm and cooperative. He continues to endorses visual hallucinations that he reports to be improving. He is agreeable to increase risperidone  to 2 mg twice daily. There continues to be concern surrounding his BP and was assessed by the hospitalist on yesterday. Headaches have improved. Back pain has also improved. He does not appear to be internally preoccupied and has been compliant with his treatment plan. Currently denies suicidal or homicidal ideations. He reports that upon discharge he would like to return to his home in [place] with appropriate follow up.   12/08/2024: Patient was seen laying in bed this morning for psychiatric reassessment during clinical rounds. Patient is alert and oriented X 4, calm, cooperative, and engages well in the interview. Patient endorses he can see the image of the voices, but he can't hear therm. Patient remains disorganized, delusional, and has auditory and visual hallucinations. Patient denies depression, anxiety, rates both a 0 out of 10. He reports eating well, but is having difficulty with sleeping due to complaints of back  pain. He endorses he is experiencing complaints of head and back pain impacting him throughout he day and night. He rates his pain as a 6 to 7 out of 10. He was noted to have elevated BP and was informed that the Hospitalist team will be notified to evaluate him. Patient is currently taking Amlodipine  and Benazepril  for HTN, but Hydralazine  25 mg PRN was added to his regimen this morning as well. Patient denies adverse reactions to his medications, suicidal or homicidal ideations, or perceptual disturbances today.        Past Psychiatric History: see h&P Family History:  Family History  Problem Relation Age of Onset   Colon cancer Mother    Hyperlipidemia Father    Heart failure Brother    Diabetes Other    Stroke Other    Social History:  Social History   Substance and Sexual Activity  Alcohol Use Yes   Alcohol/week: 1.0 standard drink of alcohol   Types: 1 Cans of beer per week   Comment: one can of beer weekly     Social History   Substance and Sexual Activity  Drug Use Not Currently   Types: Marijuana    Social History   Socioeconomic History   Marital status: Single    Spouse name: Not on file   Number of children: Not on file   Years of education: Not on file   Highest education level: Not on file  Occupational History   Not on file  Tobacco Use   Smoking status: Every Day    Types: Cigarettes   Smokeless tobacco: Current  Vaping Use   Vaping status: Never Used  Substance and Sexual Activity  Alcohol use: Yes    Alcohol/week: 1.0 standard drink of alcohol    Types: 1 Cans of beer per week    Comment: one can of beer weekly   Drug use: Not Currently    Types: Marijuana   Sexual activity: Yes    Birth control/protection: Condom  Other Topics Concern   Not on file  Social History Narrative   ** Merged History Encounter **       Social Drivers of Health   Tobacco Use: High Risk (12/07/2024)   Patient History    Smoking Tobacco Use: Every Day     Smokeless Tobacco Use: Current    Passive Exposure: Not on file  Financial Resource Strain: Low Risk (01/13/2023)   Overall Financial Resource Strain (CARDIA)    Difficulty of Paying Living Expenses: Not hard at all  Food Insecurity: Food Insecurity Present (12/07/2024)   Epic    Worried About Programme Researcher, Broadcasting/film/video in the Last Year: Sometimes true    Ran Out of Food in the Last Year: Never true  Transportation Needs: No Transportation Needs (12/07/2024)   Epic    Lack of Transportation (Medical): No    Lack of Transportation (Non-Medical): No  Physical Activity: Sufficiently Active (01/13/2023)   Exercise Vital Sign    Days of Exercise per Week: 5 days    Minutes of Exercise per Session: 60 min  Stress: Stress Concern Present (01/13/2023)   [employer] of Occupational Health - Occupational Stress Questionnaire    Feeling of Stress : To some extent  Social Connections: Socially Isolated (05/11/2024)   Social Connection and Isolation Panel    Frequency of Communication with Friends and Family: Twice a week    Frequency of Social Gatherings with Friends and Family: Once a week    Attends Religious Services: Never    Database Administrator or Organizations: No    Attends Banker Meetings: Never    Marital Status: Never married  Depression (PHQ2-9): High Risk (11/01/2024)   Depression (PHQ2-9)    PHQ-2 Score: 12  Alcohol Screen: Low Risk (12/07/2024)   Alcohol Screen    Last Alcohol Screening Score (AUDIT): 1  Housing: Low Risk (12/07/2024)   Epic    Unable to Pay for Housing in the Last Year: No    Number of Times Moved in the Last Year: 0    Homeless in the Last Year: No  Utilities: Not At Risk (12/07/2024)   Epic    Threatened with loss of utilities: No  Health Literacy: Not on file   Past Medical History:  Past Medical History:  Diagnosis Date   Depression    Hypertension    Paranoid schizophrenia (HCC)    Schizophrenia (HCC)    History reviewed. No pertinent  surgical history.  Current Medications: Current Facility-Administered Medications  Medication Dose Route Frequency Provider Last Rate Last Admin   acetaminophen  (TYLENOL ) tablet 1,000 mg  1,000 mg Oral Q6H PRN Montague, Crystal J, NP   1,000 mg at 12/09/24 0913   amLODipine  (NORVASC ) tablet 10 mg  10 mg Oral Daily Trudy Anthony HERO, MD   10 mg at 12/09/24 0908   benazepril  (LOTENSIN ) tablet 40 mg  40 mg Oral Daily Trudy Anthony HERO, MD   40 mg at 12/09/24 1001   cloNIDine  (CATAPRES ) tablet 0.1 mg  0.1 mg Oral BID Baylee Campus B, NP   0.1 mg at 12/09/24 1643   hydrochlorothiazide  (HYDRODIURIL ) tablet 25 mg  25 mg Oral  Daily Khan, Ghalib, MD   25 mg at 12/09/24 9091   multivitamin with minerals tablet 1 tablet  1 tablet Oral Daily Jadapalle, Sree, MD   1 tablet at 12/09/24 0908   nicotine  (NICODERM CQ  - dosed in mg/24 hours) patch 21 mg  21 mg Transdermal Daily PRN Jadapalle, Sree, MD   21 mg at 12/09/24 9082   OLANZapine  (ZYPREXA ) injection 5 mg  5 mg Intramuscular TID PRN Onuoha, Chinwendu V, NP       OLANZapine  zydis (ZYPREXA ) disintegrating tablet 5 mg  5 mg Oral TID PRN Onuoha, Chinwendu V, NP       risperiDONE  (RISPERDAL ) tablet 2 mg  2 mg Oral BID Dj Senteno B, NP   2 mg at 12/09/24 1643   traZODone  (DESYREL ) tablet 50 mg  50 mg Oral QHS Jadapalle, Sree, MD   50 mg at 12/09/24 2111   Vitamin D  (Ergocalciferol ) (DRISDOL ) 1.25 MG (50000 UNIT) capsule 50,000 Units  50,000 Units Oral Q7 days Jadapalle, Sree, MD   50,000 Units at 12/09/24 9089    Lab Results: No results found for this or any previous visit (from the past 48 hours).  Blood Alcohol level:  Lab Results  Component Value Date   [facility] <15 12/06/2024   ETH <15 07/10/2024    Metabolic Disorder Labs: Lab Results  Component Value Date   HGBA1C 5.6 07/10/2024   MPG 114.02 07/10/2024   MPG 111.15 05/10/2024   Lab Results  Component Value Date   PROLACTIN 4.6 05/10/2024   PROLACTIN 55.0 (H) 10/13/2017   Lab  Results  Component Value Date   CHOL 233 (H) 07/10/2024   TRIG 151 (H) 07/10/2024   HDL 101 07/10/2024   CHOLHDL 2.3 07/10/2024   VLDL 30 07/10/2024   LDLCALC 102 (H) 07/10/2024   LDLCALC 107 (H) 05/10/2024    Physical Findings: AIMS:  , ,  ,  ,    CIWA:    COWS:      Psychiatric Specialty Exam:  Presentation  General Appearance:  Appropriate for Environment  Eye Contact: Fleeting  Speech: Normal Rate  Speech Volume: Normal    Mood and Affect  Mood: Angry  Affect: Appropriate   Thought Process  Thought Processes: Coherent  Orientation:Full (Time, Place and Person)  Thought Content:WDL  Hallucinations:Hallucinations: Auditory  Ideas of Reference:None  Suicidal Thoughts:Suicidal Thoughts: Yes, Passive SI Passive Intent and/or Plan: Without Intent  Homicidal Thoughts:Homicidal Thoughts: No   Sensorium  Memory: Immediate Fair; Recent Fair; Remote Fair  Judgment: Fair  Insight: Fair   Chartered Certified Accountant: Fair  Attention Span: Fair  Recall: [employer] of Knowledge: Fair  Language: Fair   Psychomotor Activity  Psychomotor Activity: Psychomotor Activity: Normal  Musculoskeletal: Strength & Muscle Tone: within normal limits Gait & Station: normal Assets  Assets: Manufacturing Systems Engineer; Desire for Improvement    Physical Exam: Physical Exam Review of Systems  Constitutional: Negative.   HENT: Negative.    Eyes: Negative.   Respiratory: Negative.    Cardiovascular: Negative.   Gastrointestinal: Negative.   Genitourinary: Negative.   Musculoskeletal:  Positive for back pain.  Skin: Negative.   Neurological: Negative.   Endo/Heme/Allergies: Negative.   Psychiatric/Behavioral:  Positive for hallucinations.    Blood pressure (!) 140/80, pulse 98, temperature 98.2 F (36.8 C), temperature source Oral, resp. rate 18, height 5' 11 (1.803 m), weight 86.2 kg, SpO2 100%. Body mass index is 26.5  kg/m.  Diagnosis: Principal Problem:   Schizoaffective disorder, unspecified  type [facility])   PLAN: Safety and Monitoring:  -- Voluntary admission to inpatient psychiatric unit for safety, stabilization and treatment  -- Daily contact with patient to assess and evaluate symptoms and progress in treatment  -- Patient's case to be discussed in multi-disciplinary team meeting  -- Observation Level : q15 minute checks  -- Vital signs:  q12 hours  -- Precautions: suicide, elopement, and assault -- Encouraged patient to participate in unit milieu and in scheduled group therapies  2. Psychiatric Treatment:  Scheduled Medications: Increase risperidone  2 mg BID    -- The risks/benefits/side-effects/alternatives to this medication were discussed in detail with the patient and time was given for questions. The patient consents to medication trial.  3. Medical Issues Being Addressed:  HTN--will start clonidine  0.1 mg BID   4. Discharge Planning:   -- Social work and case management to assist with discharge planning and identification of hospital follow-up needs prior to discharge  -- Estimated LOS: 3-4 days  Daine KATHEE Ober, NP 12/09/2024, 10:11 PM

## 2024-12-09 NOTE — Plan of Care (Signed)
 Damon [name] is a 59 y.o. male patient. No diagnosis found. Past Medical History:  Diagnosis Date   Depression    Hypertension    Paranoid schizophrenia (HCC)    Schizophrenia (HCC)    Current Facility-Administered Medications  Medication Dose Route Frequency Provider Last Rate Last Admin   acetaminophen  (TYLENOL ) tablet 1,000 mg  1,000 mg Oral Q6H PRN Montague, Crystal J, NP   1,000 mg at 12/08/24 1703   amLODipine  (NORVASC ) tablet 5 mg  5 mg Oral Daily Fernand Prost, MD       benazepril  (LOTENSIN ) tablet 20 mg  20 mg Oral Daily Fernand Prost, MD       hydrALAZINE  (APRESOLINE ) tablet 50 mg  50 mg Oral Q8H PRN Fernand Prost, MD   50 mg at 12/08/24 2104   hydrochlorothiazide  (HYDRODIURIL ) tablet 25 mg  25 mg Oral Daily Khan, Ghalib, MD       multivitamin with minerals tablet 1 tablet  1 tablet Oral Daily Jadapalle, Sree, MD   1 tablet at 12/08/24 0833   nicotine  (NICODERM CQ  - dosed in mg/24 hours) patch 21 mg  21 mg Transdermal Daily PRN Jadapalle, Sree, MD   21 mg at 12/07/24 1824   OLANZapine  (ZYPREXA ) injection 5 mg  5 mg Intramuscular TID PRN Onuoha, Chinwendu V, NP       OLANZapine  zydis (ZYPREXA ) disintegrating tablet 5 mg  5 mg Oral TID PRN Onuoha, Chinwendu V, NP       risperiDONE  (RISPERDAL ) tablet 1.5 mg  1.5 mg Oral BID Jadapalle, Sree, MD   1.5 mg at 12/08/24 1703   traZODone  (DESYREL ) tablet 50 mg  50 mg Oral QHS Jadapalle, Sree, MD   50 mg at 12/08/24 2104   Vitamin D  (Ergocalciferol ) (DRISDOL ) 1.25 MG (50000 UNIT) capsule 50,000 Units  50,000 Units Oral Q7 days Donnelly Mellow, MD       Allergies[1] Principal Problem:   Schizoaffective disorder, unspecified type (HCC)  Blood pressure (!) 177/111, pulse 99, temperature 98.2 F (36.8 C), temperature source Oral, resp. rate 18, height 5' 11 (1.803 m), weight 86.2 kg, SpO2 100%.    Damon [name] 12/09/2024      [1]  Allergies Allergen Reactions   Spinach Hives   Zofran  [Ondansetron ] Rash

## 2024-12-10 DIAGNOSIS — F259 Schizoaffective disorder, unspecified: Secondary | ICD-10-CM | POA: Diagnosis not present

## 2024-12-10 MED ORDER — METOPROLOL TARTRATE 25 MG PO TABS: 25.0000 mg | ORAL_TABLET | Freq: Four times a day (QID) | ORAL | Status: DC | PRN

## 2024-12-10 NOTE — Group Note (Signed)
 Date:  12/10/2024 Time:  10:37 AM  Group Topic/Focus:  Orientation:   The focus of this group is to educate the patient on the purpose and policies of crisis stabilization and provide a format to answer questions about their admission.  The group details unit policies and expectations of patients while admitted.    Participation Level:  Active  Participation Quality:  Appropriate  Affect:  Appropriate  Cognitive:  Appropriate  Insight: Appropriate  Engagement in Group:  Engaged  Modes of Intervention:  Activity  Additional Comments:    Clayborne DELENA June 12/10/2024, 10:37 AM

## 2024-12-10 NOTE — Plan of Care (Signed)
   Problem: Education: Goal: Emotional status will improve Outcome: Progressing Goal: Mental status will improve Outcome: Progressing Goal: Verbalization of understanding the information provided will improve Outcome: Progressing   Problem: Activity: Goal: Interest or engagement in activities will improve Outcome: Progressing

## 2024-12-10 NOTE — Group Note (Signed)
"                                                 [facility] LCSW Group Therapy Note    Group Date: 12/10/2024 Start Time: 1200 End Time: 1215  Type of Therapy and Topic:  Group Therapy:  Overcoming Obstacles  Participation Level:  [facility] PARTICIPATION LEVEL: Active  Mood:  Description of Group:   In this group patients will be encouraged to explore what they see as obstacles to their own wellness and recovery. They will be guided to discuss their thoughts, feelings, and behaviors related to these obstacles. The group will process together ways to cope with barriers, with attention given to specific choices patients can make. Each patient will be challenged to identify changes they are motivated to make in order to overcome their obstacles. This group will be process-oriented, with patients participating in exploration of their own experiences as well as giving and receiving support and challenge from other group members.  Therapeutic Goals: 1. Patient will identify personal and current obstacles as they relate to admission. 2. Patient will identify barriers that currently interfere with their wellness or overcoming obstacles.  3. Patient will identify feelings, thought process and behaviors related to these barriers. 4. Patient will identify two changes they are willing to make to overcome these obstacles:    Summary of Patient Progress   CSW had RN assist in printing group packet for patient.  Group packet provided due to inclement weather.    Therapeutic Modalities:   Cognitive Behavioral Therapy Solution Focused Therapy Motivational Interviewing Relapse Prevention Therapy   Sherryle JINNY Margo, LCSW "

## 2024-12-10 NOTE — Progress Notes (Signed)
" °   12/09/24 2111  Psych Admission Type (Psych Patients Only)  Admission Status Voluntary  Psychosocial Assessment  Patient Complaints None  Eye Contact Fair  Facial Expression Flat  Affect Appropriate to circumstance  Speech Logical/coherent  Interaction Assertive  Motor Activity Slow  Appearance/Hygiene Unremarkable  Behavior Characteristics Cooperative  Mood Pleasant  Thought Process  Coherency WDL  Content WDL  Delusions None reported or observed  Perception Hallucinations  Hallucination Auditory;Visual  Judgment Impaired  Confusion None  Danger to Self  Current suicidal ideation? Denies  Self-Injurious Behavior No self-injurious ideation or behavior indicators observed or expressed   Agreement Not to Harm Self Yes  Description of Agreement verbal  Danger to Others  Danger to Others None reported or observed    "

## 2024-12-10 NOTE — Group Note (Signed)
 Date:  12/10/2024 Time:  8:51 PM  Group Topic/Focus:  Coping With Mental Health Crisis:   The purpose of this group is to help patients identify strategies for coping with mental health crisis.  Group discusses possible causes of crisis and ways to manage them effectively.    Participation Level:  Did Not Attend  Participation Quality:  Attentive  Affect:  Not Congruent  Cognitive:  Lacking  Insight: None  Engagement in Group:  None  Modes of Intervention:  none  Additional Comments:    Damon [name] 12/10/2024, 8:51 PM

## 2024-12-10 NOTE — [facility] Counselor (Signed)
 CSW attempted to contact [facility] to schedule appointment.    CSW left HIPAA compliant voicemail requesting a return call.  Sherryle Margo, MSW, LCSW 12/10/2024 9:22 AM

## 2024-12-10 NOTE — Progress Notes (Addendum)
 " PROGRESS NOTE    Damon [name]  FMW:[id number] DOB: February 28, 1966 DOA: 12/07/2024 PCP: Damon [name]., FNP    Assessment & Plan:   Principal Problem:   Schizoaffective disorder, unspecified type (HCC)  Assessment and Plan:  HTN:  has not taken any blood pressure medications for 5-6 months. Continue on amlodipine , benazepril , hydrochlorothiazide  and continue these meds at d/c. Pt will need a script for these meds and will need to f/u outpatient w/ PCP in 1-2 weeks. Hydralazine  prn. BP has improved   Schizoaffective disorder/MDD: continue treatment per psychiatry team.           DVT prophylaxis: ambulating often  Code Status: full  Family Communication:  Disposition Plan: as per primary team. HTN is stable on current anti-HTN regimen. At d/c send pt home on amlodipine , benazepril , hydrochlorothiazide  at current dosages. Hospitalist team will sign off.   Level of care:  Consultants:  Hospitalist   Procedures:   Antimicrobials:   Subjective:    Objective: Vitals:   12/09/24 1612 12/09/24 1822 12/10/24 0617 12/10/24 0636  BP: (!) 174/106 (!) 140/80 (!) 145/97   Pulse: (!) 113 98 (!) 116 (!) 108  Resp:   18   Temp:   98.1 F (36.7 C)   TempSrc:   Oral   SpO2: 100%  100%   Weight:      Height:       No intake or output data in the 24 hours ending 12/10/24 0859 Filed Weights   12/07/24 0638  Weight: 86.2 kg    Examination:  No PE was done today    Data Reviewed: I have personally reviewed following labs and imaging studies  CBC: Recent Labs  Lab 12/06/24 1016  WBC 5.4  HGB 14.2  HCT 41.5  MCV 92.2  PLT 333   Basic Metabolic Panel: Recent Labs  Lab 12/06/24 1016  NA 138  K 4.2  CL 103  CO2 20*  GLUCOSE 98  BUN 9  CREATININE 1.12  CALCIUM 9.2   GFR: Estimated Creatinine Clearance: 75.6 mL/min (by C-G formula based on SCr of 1.12 mg/dL). Liver Function Tests: Recent Labs  Lab 12/06/24 1016  AST 32  ALT 24  ALKPHOS 58   BILITOT 0.4  PROT 7.5  ALBUMIN 4.5   No results for input(s): LIPASE, AMYLASE in the last 168 hours. No results for input(s): AMMONIA in the last 168 hours. Coagulation Profile: No results for input(s): INR, PROTIME in the last 168 hours. Cardiac Enzymes: No results for input(s): CKTOTAL, CKMB, CKMBINDEX, TROPONINI in the last 168 hours. BNP (last 3 results) No results for input(s): PROBNP in the last 8760 hours. HbA1C: No results for input(s): HGBA1C in the last 72 hours. CBG: No results for input(s): GLUCAP in the last 168 hours. Lipid Profile: No results for input(s): CHOL, HDL, LDLCALC, TRIG, CHOLHDL, LDLDIRECT in the last 72 hours. Thyroid  Function Tests: No results for input(s): TSH, T4TOTAL, FREET4, T3FREE, THYROIDAB in the last 72 hours. Anemia Panel: No results for input(s): VITAMINB12, FOLATE, FERRITIN, TIBC, IRON, RETICCTPCT in the last 72 hours. Sepsis Labs: No results for input(s): PROCALCITON, LATICACIDVEN in the last 168 hours.  No results found for this or any previous visit (from the past 240 hours).       Radiology Studies: No results found.      Scheduled Meds:  amLODipine   10 mg Oral Daily   benazepril   40 mg Oral Daily   cloNIDine   0.1 mg Oral BID  hydrochlorothiazide   25 mg Oral Daily   multivitamin with minerals  1 tablet Oral Daily   risperiDONE   2 mg Oral BID   traZODone   50 mg Oral QHS   Vitamin D  (Ergocalciferol )  50,000 Units Oral Q7 days   Continuous Infusions:   LOS: 3 days    This is a non-billable note.    Anthony CHRISTELLA Pouch, MD [facility] Pager [phone]  If 7PM-7AM, please contact night-coverage [email or web address] 12/10/2024, 8:59 AM   "

## 2024-12-10 NOTE — Progress Notes (Signed)
 " [facility] MD Progress Note  12/10/2024 1:13 PM Damon [name]  MRN:  [id number]   The patient presents to ED via POV with worsening auditory hallucinations commanding him to hurt self. Patient has Hx of treatment for Schizoaffective disorder, depressive type  Currently prescribed:risperidone , benztropine , trazodone . He reports Noncompliance in past 3 weeks as he reports losing Medicaid.Patient is admitted to  psych unit with Q15 min safety monitoring. Multidisciplinary team approach is offered. Medication management; group/milieu therapy is offered.    Subjective:  Chart reviewed, case discussed in multidisciplinary meeting, patient seen during rounds.   12/10/24: Patient was found resting in his room during rounds.  He engages well with clinical research associate and reports that he is doing good.  He denies SI or HI but does endorse ongoing AVH.  Reporting that last night he was hearing demon voices and seeing demons as well.  He also endorses having a nightmare last night where someone was trying to kill him but then acknowledged that he had gone to sleep with his nicotine  patch on and he has nightmares when he does this.  He denies any concerns for his medications and feels that he is comfortable with them.  Risperdal  recently increased to 2 mg twice daily.  Will continue to monitor.   12/09/2024: Patient is assessed today in the group room and privacy is protected. He is alert and oriented x4, calm and cooperative. He continues to endorses visual hallucinations that he reports to be improving. He is agreeable to increase risperidone  to 2 mg twice daily. There continues to be concern surrounding his BP and was assessed by the hospitalist on yesterday. Headaches have improved. Back pain has also improved. He does not appear to be internally preoccupied and has been compliant with his treatment plan. Currently denies suicidal or homicidal ideations. He reports that upon discharge he would like to return to his home in  [place] with appropriate follow up.   12/08/2024: Patient was seen laying in bed this morning for psychiatric reassessment during clinical rounds. Patient is alert and oriented X 4, calm, cooperative, and engages well in the interview. Patient endorses he can see the image of the voices, but he can't hear therm. Patient remains disorganized, delusional, and has auditory and visual hallucinations. Patient denies depression, anxiety, rates both a 0 out of 10. He reports eating well, but is having difficulty with sleeping due to complaints of back pain. He endorses he is experiencing complaints of head and back pain impacting him throughout he day and night. He rates his pain as a 6 to 7 out of 10. He was noted to have elevated BP and was informed that the Hospitalist team will be notified to evaluate him. Patient is currently taking Amlodipine  and Benazepril  for HTN, but Hydralazine  25 mg PRN was added to his regimen this morning as well. Patient denies adverse reactions to his medications, suicidal or homicidal ideations, or perceptual disturbances today.        Past Psychiatric History: see h&P Family History:  Family History  Problem Relation Age of Onset   Colon cancer Mother    Hyperlipidemia Father    Heart failure Brother    Diabetes Other    Stroke Other    Social History:  Social History   Substance and Sexual Activity  Alcohol Use Yes   Alcohol/week: 1.0 standard drink of alcohol   Types: 1 Cans of beer per week   Comment: one can of beer weekly     Social  History   Substance and Sexual Activity  Drug Use Not Currently   Types: Marijuana    Social History   Socioeconomic History   Marital status: Single    Spouse name: Not on file   Number of children: Not on file   Years of education: Not on file   Highest education level: Not on file  Occupational History   Not on file  Tobacco Use   Smoking status: Every Day    Types: Cigarettes   Smokeless tobacco: Current   Vaping Use   Vaping status: Never Used  Substance and Sexual Activity   Alcohol use: Yes    Alcohol/week: 1.0 standard drink of alcohol    Types: 1 Cans of beer per week    Comment: one can of beer weekly   Drug use: Not Currently    Types: Marijuana   Sexual activity: Yes    Birth control/protection: Condom  Other Topics Concern   Not on file  Social History Narrative   ** Merged History Encounter **       Social Drivers of Health   Tobacco Use: High Risk (12/07/2024)   Patient History    Smoking Tobacco Use: Every Day    Smokeless Tobacco Use: Current    Passive Exposure: Not on file  Financial Resource Strain: Low Risk (01/13/2023)   Overall Financial Resource Strain (CARDIA)    Difficulty of Paying Living Expenses: Not hard at all  Food Insecurity: Food Insecurity Present (12/07/2024)   Epic    Worried About Programme Researcher, Broadcasting/film/video in the Last Year: Sometimes true    Ran Out of Food in the Last Year: Never true  Transportation Needs: No Transportation Needs (12/07/2024)   Epic    Lack of Transportation (Medical): No    Lack of Transportation (Non-Medical): No  Physical Activity: Sufficiently Active (01/13/2023)   Exercise Vital Sign    Days of Exercise per Week: 5 days    Minutes of Exercise per Session: 60 min  Stress: Stress Concern Present (01/13/2023)   [employer] of Occupational Health - Occupational Stress Questionnaire    Feeling of Stress : To some extent  Social Connections: Socially Isolated (05/11/2024)   Social Connection and Isolation Panel    Frequency of Communication with Friends and Family: Twice a week    Frequency of Social Gatherings with Friends and Family: Once a week    Attends Religious Services: Never    Database Administrator or Organizations: No    Attends Banker Meetings: Never    Marital Status: Never married  Depression (PHQ2-9): High Risk (11/01/2024)   Depression (PHQ2-9)    PHQ-2 Score: 12  Alcohol Screen: Low Risk  (12/07/2024)   Alcohol Screen    Last Alcohol Screening Score (AUDIT): 1  Housing: Low Risk (12/07/2024)   Epic    Unable to Pay for Housing in the Last Year: No    Number of Times Moved in the Last Year: 0    Homeless in the Last Year: No  Utilities: Not At Risk (12/07/2024)   Epic    Threatened with loss of utilities: No  Health Literacy: Not on file   Past Medical History:  Past Medical History:  Diagnosis Date   Depression    Hypertension    Paranoid schizophrenia (HCC)    Schizophrenia (HCC)    History reviewed. No pertinent surgical history.  Current Medications: Current Facility-Administered Medications  Medication Dose Route Frequency Provider Last Rate Last  Admin   acetaminophen  (TYLENOL ) tablet 1,000 mg  1,000 mg Oral Q6H PRN Montague, Crystal J, NP   1,000 mg at 12/10/24 0825   amLODipine  (NORVASC ) tablet 10 mg  10 mg Oral Daily Trudy Anthony HERO, MD   10 mg at 12/10/24 0818   benazepril  (LOTENSIN ) tablet 40 mg  40 mg Oral Daily Trudy Anthony HERO, MD   40 mg at 12/10/24 0818   cloNIDine  (CATAPRES ) tablet 0.1 mg  0.1 mg Oral BID Hampton, Tracie B, NP   0.1 mg at 12/10/24 9180   hydrochlorothiazide  (HYDRODIURIL ) tablet 25 mg  25 mg Oral Daily Khan, Ghalib, MD   25 mg at 12/10/24 0819   metoprolol  tartrate (LOPRESSOR ) tablet 25 mg  25 mg Oral Q6H PRN Williams, Jamiese M, MD       multivitamin with minerals tablet 1 tablet  1 tablet Oral Daily Jadapalle, Sree, MD   1 tablet at 12/10/24 0818   nicotine  (NICODERM CQ  - dosed in mg/24 hours) patch 21 mg  21 mg Transdermal Daily PRN Jadapalle, Sree, MD   21 mg at 12/10/24 9180   OLANZapine  (ZYPREXA ) injection 5 mg  5 mg Intramuscular TID PRN Onuoha, Chinwendu V, NP       OLANZapine  zydis (ZYPREXA ) disintegrating tablet 5 mg  5 mg Oral TID PRN Onuoha, Chinwendu V, NP       risperiDONE  (RISPERDAL ) tablet 2 mg  2 mg Oral BID Hampton, Tracie B, NP   2 mg at 12/10/24 0818   traZODone  (DESYREL ) tablet 50 mg  50 mg Oral QHS Jadapalle,  Sree, MD   50 mg at 12/09/24 2111   Vitamin D  (Ergocalciferol ) (DRISDOL ) 1.25 MG (50000 UNIT) capsule 50,000 Units  50,000 Units Oral Q7 days Jadapalle, Sree, MD   50,000 Units at 12/09/24 9089    Lab Results: No results found for this or any previous visit (from the past 48 hours).  Blood Alcohol level:  Lab Results  Component Value Date   [facility] <15 12/06/2024   ETH <15 07/10/2024    Metabolic Disorder Labs: Lab Results  Component Value Date   HGBA1C 5.6 07/10/2024   MPG 114.02 07/10/2024   MPG 111.15 05/10/2024   Lab Results  Component Value Date   PROLACTIN 4.6 05/10/2024   PROLACTIN 55.0 (H) 10/13/2017   Lab Results  Component Value Date   CHOL 233 (H) 07/10/2024   TRIG 151 (H) 07/10/2024   HDL 101 07/10/2024   CHOLHDL 2.3 07/10/2024   VLDL 30 07/10/2024   LDLCALC 102 (H) 07/10/2024   LDLCALC 107 (H) 05/10/2024    Physical Findings: AIMS:  , ,  ,  ,    CIWA:    COWS:      Psychiatric Specialty Exam:  Presentation  General Appearance:  Appropriate for Environment; Casual  Eye Contact: Fair  Speech: Normal Rate  Speech Volume: Normal    Mood and Affect  Mood: Euthymic  Affect: Appropriate; Congruent   Thought Process  Thought Processes: Coherent; Linear  Orientation:Full (Time, Place and Person)  Thought Content:WDL  Hallucinations:Hallucinations: Auditory; Visual Description of Auditory Hallucinations: He endorses hearing demons Description of Visual Hallucinations: Endorses seeing demons  Ideas of Reference:None  Suicidal Thoughts:Suicidal Thoughts: No SI Passive Intent and/or Plan: Without Intent  Homicidal Thoughts:Homicidal Thoughts: No   Sensorium  Memory: Immediate Fair; Recent Fair; Remote Fair  Judgment: Fair  Insight: Fair   Chartered Certified Accountant: Fair  Attention Span: Fair  Recall: [employer] of Knowledge: Fair  Language: Fair   Psychomotor Activity  Psychomotor  Activity: Psychomotor Activity: Normal  Musculoskeletal: Strength & Muscle Tone: within normal limits Gait & Station: normal Assets  Assets: Manufacturing Systems Engineer; Desire for Improvement    Physical Exam: Physical Exam Vitals and nursing note reviewed.  Constitutional:      Appearance: Normal appearance.  Pulmonary:     Effort: Pulmonary effort is normal.  Neurological:     Mental Status: He is alert and oriented to person, place, and time.    Review of Systems  Constitutional: Negative.   HENT: Negative.    Eyes: Negative.   Respiratory: Negative.  Negative for shortness of breath.   Cardiovascular: Negative.  Negative for chest pain.  Gastrointestinal: Negative.  Negative for diarrhea, nausea and vomiting.  Genitourinary: Negative.   Skin: Negative.   Neurological: Negative.   Endo/Heme/Allergies: Negative.   Psychiatric/Behavioral:  Positive for hallucinations. Negative for depression and suicidal ideas.   All other systems reviewed and are negative.  Blood pressure (!) 145/97, pulse (!) 108, temperature 98.1 F (36.7 C), temperature source Oral, resp. rate 18, height 5' 11 (1.803 m), weight 86.2 kg, SpO2 100%. Body mass index is 26.5 kg/m.  Diagnosis: Principal Problem:   Schizoaffective disorder, unspecified type (HCC)   PLAN: Safety and Monitoring:  -- Voluntary admission to inpatient psychiatric unit for safety, stabilization and treatment  -- Daily contact with patient to assess and evaluate symptoms and progress in treatment  -- Patient's case to be discussed in multi-disciplinary team meeting  -- Observation Level : q15 minute checks  -- Vital signs:  q12 hours  -- Precautions: suicide, elopement, and assault -- Encouraged patient to participate in unit milieu and in scheduled group therapies   2. Psychiatric Treatment:  Scheduled Medications: Risperidone  2 mg BID  NRT     -- The risks/benefits/side-effects/alternatives to this medication were  discussed in detail with the patient and time was given for questions. The patient consents to medication trial.   3. Medical Issues Being Addressed:  HTN-- clonidine  0.1 mg BID Hospitalist consulted due to elevated blood pressure and heart rate.  See most recent note for plan. Amlodipine  10 mg daily Lotensin  40 mg daily Hydrochlorothiazide  25 mg daily Lopressor  25 mg as needed for heart rate above 120   4. Discharge Planning:   -- Social work and case management to assist with discharge planning and identification of hospital follow-up needs prior to discharge  -- Estimated LOS: 5-7 days  Aundria Bitterman, NP 12/10/2024, 1:13 PM  "

## 2024-12-10 NOTE — Progress Notes (Signed)
" °   12/10/24 1410  Psych Admission Type (Psych Patients Only)  Admission Status Voluntary  Psychosocial Assessment  Patient Complaints None  Eye Contact Fair  Facial Expression Flat  Affect Appropriate to circumstance  Speech Logical/coherent  Interaction Assertive  Motor Activity Slow  Appearance/Hygiene Unremarkable  Behavior Characteristics Cooperative  Mood Pleasant  Thought Process  Coherency WDL  Content WDL  Delusions None reported or observed  Perception Hallucinations  Hallucination Auditory;Visual  Judgment Impaired  Confusion None  Danger to Self  Current suicidal ideation? Denies  Self-Injurious Behavior No self-injurious ideation or behavior indicators observed or expressed   Agreement Not to Harm Self Yes  Description of Agreement Verbal  Danger to Others  Danger to Others None reported or observed    "

## 2024-12-11 DIAGNOSIS — F259 Schizoaffective disorder, unspecified: Secondary | ICD-10-CM | POA: Diagnosis not present

## 2024-12-11 NOTE — Progress Notes (Signed)
" °   12/11/24 1400  Psych Admission Type (Psych Patients Only)  Admission Status Involuntary  Psychosocial Assessment  Patient Complaints None  Eye Contact Fair  Facial Expression Flat  Affect Appropriate to circumstance  Speech Logical/coherent  Interaction Assertive  Motor Activity Slow  Appearance/Hygiene Unremarkable  Behavior Characteristics Cooperative  Mood Pleasant  Aggressive Behavior  Effect No apparent injury  Thought Process  Coherency WDL  Content WDL  Delusions None reported or observed  Perception Hallucinations  Hallucination Auditory;Visual  Judgment Limited  Confusion None  Danger to Self  Current suicidal ideation? Denies  Self-Injurious Behavior No self-injurious ideation or behavior indicators observed or expressed   Agreement Not to Harm Self Yes  Description of Agreement verbal    "

## 2024-12-11 NOTE — Group Note (Signed)
 Date:  12/11/2024 Time:  9:54 AM  Group Topic/Focus:  Orientation:   The focus of this group is to educate the patient on the purpose and policies of crisis stabilization and provide a format to answer questions about their admission.  The group details unit policies and expectations of patients while admitted.    Participation Level:  Active  Participation Quality:  Appropriate  Affect:  Appropriate  Cognitive:  Appropriate  Insight: Appropriate  Engagement in Group:  Engaged  Modes of Intervention:  Activity  Additional Comments:    Damon [name] June 12/11/2024, 9:54 AM

## 2024-12-11 NOTE — Group Note (Signed)
 LCSW Group Therapy Note  Group Date: 12/11/2024 Start Time: 1210 End Time: 1300   Type of Therapy and Topic:  Group Therapy: Positive Affirmations  Participation Level:  Active   Description of Group:   This group addressed positive affirmation towards self and others.  Patients went around the room and identified two positive things about themselves and two positive things about a peer in the room.  Patients reflected on how it felt to share something positive with others, to identify positive things about themselves, and to hear positive things from others/ Patients were encouraged to have a daily reflection of positive characteristics or circumstances.   Therapeutic Goals: Patients will verbalize two of their positive qualities Patients will demonstrate empathy for others by stating two positive qualities about a peer in the group Patients will verbalize their feelings when voicing positive self affirmations and when voicing positive affirmations of others Patients will discuss the potential positive impact on their wellness/recovery of focusing on positive traits of self and others.  Summary of Patient Progress:  Patient actively engaged in the discussion and . Patient was able to identify positive affirmations about themselves as well as other group members. Patient demonstrated proficient insight into the subject matter, was respectful of peers, participated throughout the entire session.  Therapeutic Modalities:   Cognitive Behavioral Therapy Motivational Interviewing    Alveta CHRISTELLA Kerns, LCSWA 12/11/2024  2:04 PM

## 2024-12-11 NOTE — Group Note (Signed)
 Recreation Therapy Group Note   Group Topic:Coping Skills  Group Date: 12/11/2024 Start Time: 1000 End Time: 1040 Facilitators: Celestia Jeoffrey BRAVO, LRT, CTRS Location: Craft Room  Group Description: Mind Map.  Patient was provided a blank template of a diagram with 32 blank boxes in a tiered system, branching from the center (similar to a bubble chart). LRT directed patients to label the middle of the diagram Coping Skills. LRT and patients then came up with 8 different coping skills as examples. Pt were directed to record their coping skills in the 2nd tier boxes closest to the center.  Patients would then share their coping skills with the group as LRT wrote them out. LRT gave a handout of 99 different coping skills at the end of group.   Goal Area(s) Addressed: Patients will be able to define coping skills. Patient will identify new coping skills.  Patient will increase communication.   Affect/Mood: N/A   Participation Level: Did not attend    Clinical Observations/Individualized Feedback: Patient did not attend.  Plan: Continue to engage patient in RT group sessions 2-3x/week.   Jeoffrey BRAVO Celestia, LRT, CTRS 12/11/2024 10:43 AM

## 2024-12-11 NOTE — Plan of Care (Addendum)
"   Patient stated he had his morning medications, medication in the [facility] on epic was scan as incomplete. This nurse did not give any morning medication for medication duplication reasons. NP Glenys is aware. No aggressive behavior noted. Pt states he still has a little auditory and visual Hallucination.  Problem: Education: Goal: Knowledge of [place] [facility] Education information/materials will improve Outcome: Progressing Goal: Emotional status will improve Outcome: Progressing Goal: Mental status will improve Outcome: Progressing Goal: Verbalization of understanding the information provided will improve Outcome: Progressing   Problem: Activity: Goal: Interest or engagement in activities will improve Outcome: Progressing Goal: Sleeping patterns will improve Outcome: Progressing   Problem: Coping: Goal: Ability to verbalize frustrations and anger appropriately will improve Outcome: Progressing Goal: Ability to demonstrate self-control will improve Outcome: Progressing   Problem: Health Behavior/Discharge Planning: Goal: Identification of resources available to assist in meeting health care needs will improve Outcome: Progressing Goal: Compliance with treatment plan for underlying cause of condition will improve Outcome: Progressing   "

## 2024-12-11 NOTE — Plan of Care (Signed)
   Problem: Education: Goal: Knowledge of [name] General Education information/materials will improve Outcome: Progressing Goal: Emotional status will improve Outcome: Progressing Goal: Mental status will improve Outcome: Progressing Goal: Verbalization of understanding the information provided will improve Outcome: Progressing   Problem: Activity: Goal: Interest or engagement in activities will improve Outcome: Progressing Goal: Sleeping patterns will improve Outcome: Progressing   Problem: Coping: Goal: Ability to verbalize frustrations and anger appropriately will improve Outcome: Progressing Goal: Ability to demonstrate self-control will improve Outcome: Progressing

## 2024-12-11 NOTE — Group Note (Signed)
 Recreation Therapy Group Note   Group Topic:Other  Group Date: 12/11/2024 Start Time: 1530 End Time: 1535 Facilitators: Celestia Gauze E, LRT   Group therapy was not conducted due to staffing restrictions. LRT was engaged in holding the board and completing required safety checks, while the other MHT on the unit was completing admissions and walking discharges up.    Plan: Continue to engage patient in RT group sessions 2-3x/week.   Gauze FORBES Celestia, LRT, CTRS 12/11/2024 5:09 PM

## 2024-12-11 NOTE — Group Note (Signed)
 Date:  12/11/2024 Time:  9:16 PM  Group Topic/Focus:  Wrap-Up Group:   The focus of this group is to help patients review their daily goal of treatment and discuss progress on daily workbooks.    Participation Level:  Did Not Attend  Participation Quality:  none  Affect:  none  Cognitive:  none  Insight: None  Engagement in Group:  None  Modes of Intervention:  none  Additional Comments:    Ginny JONETTA Galeazzi 12/11/2024, 9:16 PM

## 2024-12-11 NOTE — Progress Notes (Signed)
 " [facility] MD Progress Note  12/11/2024 3:07 PM Damon [name]  MRN:  [id number]   The patient presents to ED via POV with worsening auditory hallucinations commanding him to hurt self. Patient has Hx of treatment for Schizoaffective disorder, depressive type  Currently prescribed:risperidone , benztropine , trazodone . He reports Noncompliance in past 3 weeks as he reports losing Medicaid.Patient is admitted to  psych unit with Q15 min safety monitoring. Multidisciplinary team approach is offered. Medication management; group/milieu therapy is offered.    Subjective:  Chart reviewed, case discussed in multidisciplinary meeting, patient seen during rounds.   12/11/2024: Patient found resting in room. He feels that he is doing a lot better he continues to endorse auditory hallucinations but reports that they are scrambled and he cannot understand them.  He denies any concerns with his medication identifying previously being on risperidone  and it being effective.  He endorses that stressors such as financial concerns increases anxiety which increases his voices that he hears.  He reports that his appetite is good and his sleep is okay.  He endorses concerns about going home prior to Friday as he gets his check on Friday but is concerned about getting his basic needs such as food and paying bills prior to his check.  He denies SI and HI.  12/10/24: Patient was found resting in his room during rounds.  He engages well with clinical research associate and reports that he is doing good.  He denies SI or HI but does endorse ongoing AVH.  Reporting that last night he was hearing demon voices and seeing demons as well.  He also endorses having a nightmare last night where someone was trying to kill him but then acknowledged that he had gone to sleep with his nicotine  patch on and he has nightmares when he does this.  He denies any concerns for his medications and feels that he is comfortable with them.  Risperdal  recently increased  to 2 mg twice daily.  Will continue to monitor.   12/09/2024: Patient is assessed today in the group room and privacy is protected. He is alert and oriented x4, calm and cooperative. He continues to endorses visual hallucinations that he reports to be improving. He is agreeable to increase risperidone  to 2 mg twice daily. There continues to be concern surrounding his BP and was assessed by the hospitalist on yesterday. Headaches have improved. Back pain has also improved. He does not appear to be internally preoccupied and has been compliant with his treatment plan. Currently denies suicidal or homicidal ideations. He reports that upon discharge he would like to return to his home in [place] with appropriate follow up.   12/08/2024: Patient was seen laying in bed this morning for psychiatric reassessment during clinical rounds. Patient is alert and oriented X 4, calm, cooperative, and engages well in the interview. Patient endorses he can see the image of the voices, but he can't hear therm. Patient remains disorganized, delusional, and has auditory and visual hallucinations. Patient denies depression, anxiety, rates both a 0 out of 10. He reports eating well, but is having difficulty with sleeping due to complaints of back pain. He endorses he is experiencing complaints of head and back pain impacting him throughout he day and night. He rates his pain as a 6 to 7 out of 10. He was noted to have elevated BP and was informed that the Hospitalist team will be notified to evaluate him. Patient is currently taking Amlodipine  and Benazepril  for HTN, but Hydralazine  25 mg  PRN was added to his regimen this morning as well. Patient denies adverse reactions to his medications, suicidal or homicidal ideations, or perceptual disturbances today.        Past Psychiatric History: see h&P Family History:  Family History  Problem Relation Age of Onset   Colon cancer Mother    Hyperlipidemia Father    Heart failure  Brother    Diabetes Other    Stroke Other    Social History:  Social History   Substance and Sexual Activity  Alcohol Use Yes   Alcohol/week: 1.0 standard drink of alcohol   Types: 1 Cans of beer per week   Comment: one can of beer weekly     Social History   Substance and Sexual Activity  Drug Use Not Currently   Types: Marijuana    Social History   Socioeconomic History   Marital status: Single    Spouse name: Not on file   Number of children: Not on file   Years of education: Not on file   Highest education level: Not on file  Occupational History   Not on file  Tobacco Use   Smoking status: Every Day    Types: Cigarettes   Smokeless tobacco: Current  Vaping Use   Vaping status: Never Used  Substance and Sexual Activity   Alcohol use: Yes    Alcohol/week: 1.0 standard drink of alcohol    Types: 1 Cans of beer per week    Comment: one can of beer weekly   Drug use: Not Currently    Types: Marijuana   Sexual activity: Yes    Birth control/protection: Condom  Other Topics Concern   Not on file  Social History Narrative   ** Merged History Encounter **       Social Drivers of Health   Tobacco Use: High Risk (12/07/2024)   Patient History    Smoking Tobacco Use: Every Day    Smokeless Tobacco Use: Current    Passive Exposure: Not on file  Financial Resource Strain: Low Risk (01/13/2023)   Overall Financial Resource Strain (CARDIA)    Difficulty of Paying Living Expenses: Not hard at all  Food Insecurity: Food Insecurity Present (12/07/2024)   Epic    Worried About Programme Researcher, Broadcasting/film/video in the Last Year: Sometimes true    Ran Out of Food in the Last Year: Never true  Transportation Needs: No Transportation Needs (12/07/2024)   Epic    Lack of Transportation (Medical): No    Lack of Transportation (Non-Medical): No  Physical Activity: Sufficiently Active (01/13/2023)   Exercise Vital Sign    Days of Exercise per Week: 5 days    Minutes of Exercise per  Session: 60 min  Stress: Stress Concern Present (01/13/2023)   [employer] of Occupational Health - Occupational Stress Questionnaire    Feeling of Stress : To some extent  Social Connections: Socially Isolated (05/11/2024)   Social Connection and Isolation Panel    Frequency of Communication with Friends and Family: Twice a week    Frequency of Social Gatherings with Friends and Family: Once a week    Attends Religious Services: Never    Database Administrator or Organizations: No    Attends Banker Meetings: Never    Marital Status: Never married  Depression (PHQ2-9): High Risk (11/01/2024)   Depression (PHQ2-9)    PHQ-2 Score: 12  Alcohol Screen: Low Risk (12/07/2024)   Alcohol Screen    Last Alcohol Screening Score (  AUDIT): 1  Housing: Low Risk (12/07/2024)   Epic    Unable to Pay for Housing in the Last Year: No    Number of Times Moved in the Last Year: 0    Homeless in the Last Year: No  Utilities: Not At Risk (12/07/2024)   Epic    Threatened with loss of utilities: No  Health Literacy: Not on file   Past Medical History:  Past Medical History:  Diagnosis Date   Depression    Hypertension    Paranoid schizophrenia (HCC)    Schizophrenia (HCC)    History reviewed. No pertinent surgical history.  Current Medications: Current Facility-Administered Medications  Medication Dose Route Frequency Provider Last Rate Last Admin   acetaminophen  (TYLENOL ) tablet 1,000 mg  1,000 mg Oral Q6H PRN Montague, Crystal J, NP   1,000 mg at 12/10/24 0825   amLODipine  (NORVASC ) tablet 10 mg  10 mg Oral Daily Trudy Anthony HERO, MD   10 mg at 12/10/24 0818   benazepril  (LOTENSIN ) tablet 40 mg  40 mg Oral Daily Trudy Anthony HERO, MD   40 mg at 12/10/24 0818   cloNIDine  (CATAPRES ) tablet 0.1 mg  0.1 mg Oral BID Hampton, Tracie B, NP   0.1 mg at 12/10/24 1706   hydrochlorothiazide  (HYDRODIURIL ) tablet 25 mg  25 mg Oral Daily Khan, Ghalib, MD   25 mg at 12/10/24 9180    metoprolol  tartrate (LOPRESSOR ) tablet 25 mg  25 mg Oral Q6H PRN Williams, Jamiese M, MD       multivitamin with minerals tablet 1 tablet  1 tablet Oral Daily Jadapalle, Sree, MD   1 tablet at 12/10/24 0818   nicotine  (NICODERM CQ  - dosed in mg/24 hours) patch 21 mg  21 mg Transdermal Daily PRN Jadapalle, Sree, MD   21 mg at 12/11/24 0840   OLANZapine  (ZYPREXA ) injection 5 mg  5 mg Intramuscular TID PRN Onuoha, Chinwendu V, NP       OLANZapine  zydis (ZYPREXA ) disintegrating tablet 5 mg  5 mg Oral TID PRN Onuoha, Chinwendu V, NP       risperiDONE  (RISPERDAL ) tablet 2 mg  2 mg Oral BID Hampton, Tracie B, NP   2 mg at 12/10/24 1706   traZODone  (DESYREL ) tablet 50 mg  50 mg Oral QHS Jadapalle, Sree, MD   50 mg at 12/10/24 2108   Vitamin D  (Ergocalciferol ) (DRISDOL ) 1.25 MG (50000 UNIT) capsule 50,000 Units  50,000 Units Oral Q7 days Jadapalle, Sree, MD   50,000 Units at 12/09/24 9089    Lab Results: No results found for this or any previous visit (from the past 48 hours).  Blood Alcohol level:  Lab Results  Component Value Date   [facility] <15 12/06/2024   ETH <15 07/10/2024    Metabolic Disorder Labs: Lab Results  Component Value Date   HGBA1C 5.6 07/10/2024   MPG 114.02 07/10/2024   MPG 111.15 05/10/2024   Lab Results  Component Value Date   PROLACTIN 4.6 05/10/2024   PROLACTIN 55.0 (H) 10/13/2017   Lab Results  Component Value Date   CHOL 233 (H) 07/10/2024   TRIG 151 (H) 07/10/2024   HDL 101 07/10/2024   CHOLHDL 2.3 07/10/2024   VLDL 30 07/10/2024   LDLCALC 102 (H) 07/10/2024   LDLCALC 107 (H) 05/10/2024    Physical Findings: AIMS:  , ,  ,  ,    CIWA:    COWS:      Psychiatric Specialty Exam:  Presentation  General Appearance:  Appropriate  for Environment; Casual  Eye Contact: Fair  Speech: Normal Rate  Speech Volume: Normal    Mood and Affect  Mood: Euthymic  Affect: Appropriate; Congruent   Thought Process  Thought Processes: Coherent;  Linear  Orientation:Full (Time, Place and Person)  Thought Content:WDL  Hallucinations:Hallucinations: Auditory; Visual Description of Auditory Hallucinations: He endorses hearing demons Description of Visual Hallucinations: Endorses seeing demons  Ideas of Reference:None  Suicidal Thoughts:Suicidal Thoughts: No  Homicidal Thoughts:Homicidal Thoughts: No   Sensorium  Memory: Immediate Fair; Recent Fair; Remote Fair  Judgment: Fair  Insight: Fair   Art Therapist  Concentration: Fair  Attention Span: Fair  Recall: [employer] of Knowledge: Fair  Language: Fair   Psychomotor Activity  Psychomotor Activity: Psychomotor Activity: Normal  Musculoskeletal: Strength & Muscle Tone: within normal limits Gait & Station: normal Assets  Assets: Manufacturing Systems Engineer; Desire for Improvement    Physical Exam: Physical Exam Vitals and nursing note reviewed.  Constitutional:      Appearance: Normal appearance.  Pulmonary:     Effort: Pulmonary effort is normal.  Neurological:     Mental Status: He is alert and oriented to person, place, and time.  Psychiatric:        Mood and Affect: Mood normal.        Behavior: Behavior normal.    Review of Systems  Constitutional: Negative.   HENT: Negative.    Eyes: Negative.   Respiratory: Negative.  Negative for shortness of breath.   Cardiovascular: Negative.  Negative for chest pain.  Gastrointestinal: Negative.  Negative for diarrhea, nausea and vomiting.  Genitourinary: Negative.   Skin: Negative.   Neurological: Negative.   Endo/Heme/Allergies: Negative.   Psychiatric/Behavioral:  Positive for hallucinations. Negative for depression and suicidal ideas.   All other systems reviewed and are negative.  Blood pressure (!) 124/96, pulse (!) 117, temperature 98 F (36.7 C), temperature source Oral, resp. rate 16, height 5' 11 (1.803 m), weight 86.2 kg, SpO2 99%. Body mass index is 26.5  kg/m.  Diagnosis: Principal Problem:   Schizoaffective disorder, unspecified type (HCC)   PLAN: Safety and Monitoring:  -- Voluntary admission to inpatient psychiatric unit for safety, stabilization and treatment  -- Daily contact with patient to assess and evaluate symptoms and progress in treatment  -- Patient's case to be discussed in multi-disciplinary team meeting  -- Observation Level : q15 minute checks  -- Vital signs:  q12 hours  -- Precautions: suicide, elopement, and assault -- Encouraged patient to participate in unit milieu and in scheduled group therapies   2. Psychiatric Treatment:  Scheduled Medications: Risperidone  2 mg BID  NRT     -- The risks/benefits/side-effects/alternatives to this medication were discussed in detail with the patient and time was given for questions. The patient consents to medication trial.   3. Medical Issues Being Addressed:  HTN-- clonidine  0.1 mg BID Hospitalist consulted due to elevated blood pressure and heart rate.  See most recent note for plan. Amlodipine  10 mg daily Lotensin  40 mg daily Hydrochlorothiazide  25 mg daily Lopressor  25 mg as needed for heart rate above 120   4. Discharge Planning:   -- Social work and case management to assist with discharge planning and identification of hospital follow-up needs prior to discharge  -- Estimated LOS: 5-7 days  Social work involved to address concerns about financially affording needs such as food prior to Friday.  Roniqua Kintz, NP 12/11/2024, 3:07 PM  "

## 2024-12-12 DIAGNOSIS — F259 Schizoaffective disorder, unspecified: Secondary | ICD-10-CM | POA: Diagnosis not present

## 2024-12-12 MED ORDER — HYDROCHLOROTHIAZIDE 25 MG PO TABS: 25.0000 mg | ORAL_TABLET | Freq: Every day | ORAL | 0 refills | Status: AC

## 2024-12-12 MED ORDER — RISPERIDONE 2 MG PO TABS: 2.0000 mg | ORAL_TABLET | Freq: Two times a day (BID) | ORAL | 0 refills | Status: AC

## 2024-12-12 MED ORDER — NICOTINE 21 MG/24HR TD PT24: 21.0000 mg | MEDICATED_PATCH | Freq: Every day | TRANSDERMAL | 0 refills | Status: AC | PRN

## 2024-12-12 MED ORDER — CLONIDINE HCL 0.1 MG PO TABS: 0.1000 mg | ORAL_TABLET | Freq: Two times a day (BID) | ORAL | 11 refills | Status: AC

## 2024-12-12 NOTE — BH IP Treatment Plan (Signed)
 Interdisciplinary Treatment and Diagnostic Plan Update  12/12/2024 Time of Session: 10:24 AM Damon [name] MRN: [id number]  Principal Diagnosis: Schizoaffective disorder, unspecified type (HCC)  Secondary Diagnoses: Principal Problem:   Schizoaffective disorder, unspecified type (HCC)   Current Medications:  Current Facility-Administered Medications  Medication Dose Route Frequency Provider Last Rate Last Admin   acetaminophen  (TYLENOL ) tablet 1,000 mg  1,000 mg Oral Q6H PRN Montague, Crystal J, NP   1,000 mg at 12/12/24 0850   amLODipine  (NORVASC ) tablet 10 mg  10 mg Oral Daily Trudy Anthony HERO, MD   10 mg at 12/12/24 0848   benazepril  (LOTENSIN ) tablet 40 mg  40 mg Oral Daily Trudy Anthony HERO, MD   40 mg at 12/12/24 0848   cloNIDine  (CATAPRES ) tablet 0.1 mg  0.1 mg Oral BID Hampton, Tracie B, NP   0.1 mg at 12/12/24 0848   hydrochlorothiazide  (HYDRODIURIL ) tablet 25 mg  25 mg Oral Daily Khan, Ghalib, MD   25 mg at 12/12/24 0848   metoprolol  tartrate (LOPRESSOR ) tablet 25 mg  25 mg Oral Q6H PRN Williams, Jamiese M, MD       multivitamin with minerals tablet 1 tablet  1 tablet Oral Daily Jadapalle, Sree, MD   1 tablet at 12/12/24 0848   nicotine  (NICODERM CQ  - dosed in mg/24 hours) patch 21 mg  21 mg Transdermal Daily PRN Jadapalle, Sree, MD   21 mg at 12/12/24 9146   OLANZapine  (ZYPREXA ) injection 5 mg  5 mg Intramuscular TID PRN Onuoha, Chinwendu V, NP       OLANZapine  zydis (ZYPREXA ) disintegrating tablet 5 mg  5 mg Oral TID PRN Onuoha, Chinwendu V, NP       risperiDONE  (RISPERDAL ) tablet 2 mg  2 mg Oral BID Hampton, Tracie B, NP   2 mg at 12/12/24 0848   traZODone  (DESYREL ) tablet 50 mg  50 mg Oral QHS Jadapalle, Sree, MD   50 mg at 12/11/24 2108   Vitamin D  (Ergocalciferol ) (DRISDOL ) 1.25 MG (50000 UNIT) capsule 50,000 Units  50,000 Units Oral Q7 days Jadapalle, Sree, MD   50,000 Units at 12/09/24 0910   PTA Medications: Medications Prior to Admission  Medication Sig  Dispense Refill Last Dose/Taking   amLODipine  (NORVASC ) 10 MG tablet Take 1 tablet (10 mg total) by mouth daily. For hypertension (Patient not taking: Reported on 12/06/2024) 30 tablet 0    benazepril  (LOTENSIN ) 10 MG tablet Take 1 tablet (10 mg total) by mouth daily. For hypertension. (Patient not taking: Reported on 12/06/2024) 30 tablet 0    benztropine  (COGENTIN ) 1 MG tablet Take 1 tablet (1 mg total) by mouth 2 (two) times daily. For prevention of antipsychotic side effects. (Patient not taking: Reported on 12/06/2024) 60 tablet 0    hydrOXYzine  (ATARAX ) 25 MG tablet Take 1 tablet (25 mg total) by mouth 3 (three) times daily as needed for anxiety. (Patient not taking: Reported on 12/06/2024) 75 tablet 0    Multiple Vitamins-Minerals (MULTIVITAMIN MEN 50+) TABS Take 1 tablet by mouth daily. (Patient not taking: Reported on 12/06/2024)      nicotine  (NICODERM CQ  - DOSED IN MG/24 HOURS) 21 mg/24hr patch Place 1 patch (21 mg total) onto the skin daily as needed. (May buy from over there counter): For smoking cessation.      Nutritional Supplements (ENSURE ORIGINAL) LIQD Take 1 Bottle by mouth 2 (two) times daily as needed (poor PO intake).      risperiDONE  (RISPERDAL ) 0.5 MG tablet Take 3 tablets (1.5 mg total)  by mouth 2 (two) times daily. For mood control (Patient not taking: Reported on 12/06/2024) 90 tablet 0    traZODone  (DESYREL ) 50 MG tablet Take 1 tablet (50 mg total) by mouth at bedtime. For sleep (Patient not taking: Reported on 12/06/2024) 30 tablet 0    Vitamin D , Ergocalciferol , (DRISDOL ) 1.25 MG (50000 UNIT) CAPS capsule Take 1 capsule (50,000 Units total) by mouth every 7 (seven) days. For bone health. (Patient not taking: Reported on 12/06/2024) 5 capsule 0     Patient Stressors: Medication change or noncompliance    Patient Strengths: Ability for insight  Capable of independent living  Physical Health   Treatment Modalities: Medication Management, Group therapy, Case management,  1 to 1  session with clinician, Psychoeducation, Recreational therapy.   Physician Treatment Plan for Primary Diagnosis: Schizoaffective disorder, unspecified type (HCC) Long Term Goal(s): Improvement in symptoms so as ready for discharge   Short Term Goals: Ability to identify changes in lifestyle to reduce recurrence of condition will improve Ability to verbalize feelings will improve Ability to disclose and discuss suicidal ideas Ability to demonstrate self-control will improve Ability to identify and develop effective coping behaviors will improve Ability to maintain clinical measurements within normal limits will improve  Medication Management: Evaluate patient's response, side effects, and tolerance of medication regimen.  Therapeutic Interventions: 1 to 1 sessions, Unit Group sessions and Medication administration.  Evaluation of Outcomes: Not Met  Physician Treatment Plan for Secondary Diagnosis: Principal Problem:   Schizoaffective disorder, unspecified type (HCC)  Long Term Goal(s): Improvement in symptoms so as ready for discharge   Short Term Goals: Ability to identify changes in lifestyle to reduce recurrence of condition will improve Ability to verbalize feelings will improve Ability to disclose and discuss suicidal ideas Ability to demonstrate self-control will improve Ability to identify and develop effective coping behaviors will improve Ability to maintain clinical measurements within normal limits will improve     Medication Management: Evaluate patient's response, side effects, and tolerance of medication regimen.  Therapeutic Interventions: 1 to 1 sessions, Unit Group sessions and Medication administration.  Evaluation of Outcomes: Not Met   RN Treatment Plan for Primary Diagnosis: Schizoaffective disorder, unspecified type (HCC) Long Term Goal(s): Knowledge of disease and therapeutic regimen to maintain health will improve  Short Term Goals: Ability to verbalize  frustration and anger appropriately will improve, Ability to demonstrate self-control, Ability to participate in decision making will improve, Ability to verbalize feelings will improve, Ability to disclose and discuss suicidal ideas, and Ability to identify and develop effective coping behaviors will improve  Medication Management: RN will administer medications as ordered by provider, will assess and evaluate patient's response and provide education to patient for prescribed medication. RN will report any adverse and/or side effects to prescribing provider.  Therapeutic Interventions: 1 on 1 counseling sessions, Psychoeducation, Medication administration, Evaluate responses to treatment, Monitor vital signs and CBGs as ordered, Perform/monitor CIWA, COWS, AIMS and Fall Risk screenings as ordered, Perform wound care treatments as ordered.  Evaluation of Outcomes: Not Met   LCSW Treatment Plan for Primary Diagnosis: Schizoaffective disorder, unspecified type (HCC) Long Term Goal(s): Safe transition to appropriate next level of care at discharge, Engage patient in therapeutic group addressing interpersonal concerns.  Short Term Goals: Engage patient in aftercare planning with referrals and resources, Increase social support, Increase ability to appropriately verbalize feelings, Increase emotional regulation, Facilitate acceptance of mental health diagnosis and concerns, Facilitate patient progression through stages of change regarding substance use diagnoses and  concerns, Identify triggers associated with mental health/substance abuse issues, and Increase skills for wellness and recovery  Therapeutic Interventions: Assess for all discharge needs, 1 to 1 time with Social worker, Explore available resources and support systems, Assess for adequacy in community support network, Educate family and significant other(s) on suicide prevention, Complete Psychosocial Assessment, Interpersonal group  therapy.  Evaluation of Outcomes: Not Met   Progress in Treatment: Attending groups: Yes. and No. Participating in groups: Yes. and No. Taking medication as prescribed: Yes. and No. Toleration medication: Yes. Family/Significant other contact made: No, will contact:  CSW to contact once permission Is granted. Patient understands diagnosis: Yes. Discussing patient identified problems/goals with staff: Yes. Medical problems stabilized or resolved: Yes. Denies suicidal/homicidal ideation: Yes. Issues/concerns per patient self-inventory: No. Other: None  New problem(s) identified: No, Describe:  NONE  New Short Term/Long Term Goal(s): detox, elimination of symptoms of psychosis, medication management for mood stabilization; elimination of SI thoughts; development of comprehensive mental wellness/sobriety plan.    Patient Goals:  Getting myself relaxed and that's it.  Discharge Plan or Barriers: CSW to assist with the development of appropriate discharge plan.    Reason for Continuation of Hospitalization: Anxiety Depression Suicidal ideation  Estimated Length of Stay: 1-7 days.   Last 3 [place] Suicide Severity Risk Score: Flowsheet Row Admission (Current) from 12/07/2024 in [facility]IENT [facility] ED from 12/06/2024 in [facility] Emergency Department at [facility] Counselor from 11/01/2024 in [facility] at [facility] RISK CATEGORY High Risk High Risk Moderate Risk    Last PHQ 2/9 Scores:    11/01/2024    4:00 PM 01/31/2024   10:53 AM 01/29/2024    6:50 AM  Depression screen PHQ 2/9  Decreased Interest 2 0 1  Down, Depressed, Hopeless 0 0 1  PHQ - 2 Score 2 0 2  Altered sleeping 1 0 0  Tired, decreased energy 3 1 0  Change in appetite 2 0 0  Feeling bad or failure about yourself  1 0 0  Trouble concentrating 3 1 1   Moving slowly or fidgety/restless 0 0 0  Suicidal thoughts 0 0 0  PHQ-9 Score 12 2  3     Difficult doing work/chores Somewhat difficult Not difficult at all Somewhat difficult     Data saved with a previous flowsheet row definition    Scribe for Treatment Team: Indiyah Paone M Conda Wannamaker, LCSW 12/12/2024 10:24 AM

## 2024-12-12 NOTE — Progress Notes (Signed)
" °   12/12/24 1845  Psych Admission Type (Psych Patients Only)  Admission Status Involuntary  Psychosocial Assessment  Patient Complaints Anxiety (patient stated I'm still here and not at home.)  Eye Contact Fair  Facial Expression Other (Comment) (appropriate)  Affect Appropriate to circumstance  Speech Logical/coherent  Interaction Assertive  Motor Activity Slow  Appearance/Hygiene Unremarkable  Behavior Characteristics Cooperative;Appropriate to situation  Mood Pleasant  Aggressive Behavior  Effect No apparent injury  Thought Process  Coherency WDL  Content WDL  Delusions None reported or observed  Perception Hallucinations  Hallucination Auditory (patient stated I can't understand them.)  Judgment WDL  Confusion None  Danger to Self  Current suicidal ideation? Denies  Self-Injurious Behavior No self-injurious ideation or behavior indicators observed or expressed   Agreement Not to Harm Self Yes  Description of Agreement Verbal  Danger to Others  Danger to Others None reported or observed   Patient's goal for today, per his self-inventory is to go home, in which he will let my meds work in order to achieve his goal. "

## 2024-12-12 NOTE — [facility] Suicide Risk Assessment (Cosign Needed)
 [facility] Discharge Suicide Risk Assessment   Principal Problem: Schizoaffective disorder, unspecified type (HCC) Discharge Diagnoses: Principal Problem:   Schizoaffective disorder, unspecified type (HCC)   Total Time spent with patient: 45 minutes  Musculoskeletal: Strength & Muscle Tone: within normal limits Gait & Station: normal Patient leans: N/A  Psychiatric Specialty Exam  Presentation  General Appearance:  Appropriate for Environment; Casual  Eye Contact: Good  Speech: Clear and Coherent; Normal Rate  Speech Volume: Normal  Handedness: Right   Mood and Affect  Mood: Euthymic  Duration of Depression Symptoms: Greater than two weeks  Affect: Appropriate; Congruent   Thought Process  Thought Processes: Coherent; Linear  Descriptions of Associations:Intact  Orientation:Full (Time, Place and Person)  Thought Content:Logical  History of Schizophrenia/Schizoaffective disorder:Yes  Duration of Psychotic Symptoms:Greater than six months  Hallucinations:Hallucinations: Auditory Description of Auditory Hallucinations: scrambled - in terms of voices  Ideas of Reference:None  Suicidal Thoughts:Suicidal Thoughts: No  Homicidal Thoughts:Homicidal Thoughts: No   Sensorium  Memory: Immediate Fair; Recent Fair  Judgment: Fair  Insight: Fair   Chartered Certified Accountant: Fair  Attention Span: Fair  Recall: [employer] of Knowledge: Fair  Language: Fair   Psychomotor Activity  Psychomotor Activity: Psychomotor Activity: Normal   Assets  Assets: Communication Skills; Desire for Improvement; Housing; Financial Resources/Insurance   Sleep  Sleep: Sleep: Good  Estimated Sleeping Duration (Last 24 Hours): 9.50-10.25 hours  Physical Exam: Physical Exam Vitals and nursing note reviewed.  Constitutional:      Appearance: Normal appearance.  Pulmonary:     Effort: Pulmonary effort is normal.  Neurological:      Mental Status: He is alert and oriented to person, place, and time.  Psychiatric:        Mood and Affect: Mood normal.        Behavior: Behavior normal.    Review of Systems  Respiratory:  Negative for shortness of breath.   Cardiovascular:  Negative for chest pain.  Gastrointestinal:  Negative for diarrhea, nausea and vomiting.  Psychiatric/Behavioral:  Positive for hallucinations. Negative for depression and suicidal ideas. The patient is not nervous/anxious.   All other systems reviewed and are negative.  Blood pressure 127/82, pulse 99, temperature 98.6 F (37 C), temperature source Oral, resp. rate 17, height 5' 11 (1.803 m), weight 86.2 kg, SpO2 100%. Body mass index is 26.5 kg/m.  Mental Status Per Nursing Assessment::   On Admission:  Suicidal ideation indicated by patient  Demographic Factors:  Male, Low socioeconomic status, Living alone, and Unemployed  Loss Factors: NA  Historical Factors: Prior suicide attempts  Risk Reduction Factors:   Positive therapeutic relationship  Continued Clinical Symptoms:  Previous Psychiatric Diagnoses and Treatments  Cognitive Features That Contribute To Risk:  None    Suicide Risk:  Minimal: No identifiable suicidal ideation.  Patients presenting with no risk factors but with morbid ruminations; Hollan Philipp be classified as minimal risk based on the severity of the depressive symptoms   Follow-up Information     [facility].   Why: Therapy appointment is scheduled for 12/27/2024 at 11AM with [facility]. Medication management appointment is scheduled for March 2nd, 2026 at 1:30PM with Dr. Chipper. Contact information: [address] [place] [address] [phone]                 Plan Of Care/Follow-up recommendations:  Follow up with outpatient providers/appointments listed above  Glenys Beal, NP 12/12/2024, 11:07 PM

## 2024-12-12 NOTE — Progress Notes (Signed)
 " [facility] MD Progress Note  12/12/2024 12:31 PM Damon HEINLE  MRN:  [id number]   The patient presents to ED via POV with worsening auditory hallucinations commanding him to hurt self. Patient has Hx of treatment for Schizoaffective disorder, depressive type  Currently prescribed:risperidone , benztropine , trazodone . He reports Noncompliance in past 3 weeks as he reports losing Medicaid.Patient is admitted to  psych unit with Q15 min safety monitoring. Multidisciplinary team approach is offered. Medication management; group/milieu therapy is offered.    Subjective:  Chart reviewed, case discussed in multidisciplinary meeting, patient seen during rounds.   12/12/2024: Patient found resting in room today during rounds.  He reports that he is feeling good.  He denies SI and HI.  He endorses ongoing auditory hallucinations, which appear to potentially be his baseline.  He endorses that they are significantly better than when he initially presented and that he cannot understand them at this time.  He does not appear to be responding to internal stimuli.  Yesterday he expressed concern about basic needs and financial resources when discussing discharge.  Discussed options for resources with patient and potential for discharge tomorrow.  Patient endorses feeling comfortable with discharge tomorrow as he has food to get him to Friday when he gets paid.  He denies any other concerns and expresses looking forward to going home tomorrow.  12/11/2024: Patient found resting in room. He feels that he is doing a lot better he continues to endorse auditory hallucinations but reports that they are scrambled and he cannot understand them.  He denies any concerns with his medication identifying previously being on risperidone  and it being effective.  He endorses that stressors such as financial concerns increases anxiety which increases his voices that he hears.  He reports that his appetite is good and his sleep is  okay.  He endorses concerns about going home prior to Friday as he gets his check on Friday but is concerned about getting his basic needs such as food and paying bills prior to his check.  He denies SI and HI.  12/10/24: Patient was found resting in his room during rounds.  He engages well with clinical research associate and reports that he is doing good.  He denies SI or HI but does endorse ongoing AVH.  Reporting that last night he was hearing demon voices and seeing demons as well.  He also endorses having a nightmare last night where someone was trying to kill him but then acknowledged that he had gone to sleep with his nicotine  patch on and he has nightmares when he does this.  He denies any concerns for his medications and feels that he is comfortable with them.  Risperdal  recently increased to 2 mg twice daily.  Will continue to monitor.   12/09/2024: Patient is assessed today in the group room and privacy is protected. He is alert and oriented x4, calm and cooperative. He continues to endorses visual hallucinations that he reports to be improving. He is agreeable to increase risperidone  to 2 mg twice daily. There continues to be concern surrounding his BP and was assessed by the hospitalist on yesterday. Headaches have improved. Back pain has also improved. He does not appear to be internally preoccupied and has been compliant with his treatment plan. Currently denies suicidal or homicidal ideations. He reports that upon discharge he would like to return to his home in [place] with appropriate follow up.   12/08/2024: Patient was seen laying in bed this morning for psychiatric reassessment during clinical  rounds. Patient is alert and oriented X 4, calm, cooperative, and engages well in the interview. Patient endorses he can see the image of the voices, but he can't hear therm. Patient remains disorganized, delusional, and has auditory and visual hallucinations. Patient denies depression, anxiety, rates both a 0  out of 10. He reports eating well, but is having difficulty with sleeping due to complaints of back pain. He endorses he is experiencing complaints of head and back pain impacting him throughout he day and night. He rates his pain as a 6 to 7 out of 10. He was noted to have elevated BP and was informed that the Hospitalist team will be notified to evaluate him. Patient is currently taking Amlodipine  and Benazepril  for HTN, but Hydralazine  25 mg PRN was added to his regimen this morning as well. Patient denies adverse reactions to his medications, suicidal or homicidal ideations, or perceptual disturbances today.        Past Psychiatric History: see h&P Family History:  Family History  Problem Relation Age of Onset   Colon cancer Mother    Hyperlipidemia Father    Heart failure Brother    Diabetes Other    Stroke Other    Social History:  Social History   Substance and Sexual Activity  Alcohol Use Yes   Alcohol/week: 1.0 standard drink of alcohol   Types: 1 Cans of beer per week   Comment: one can of beer weekly     Social History   Substance and Sexual Activity  Drug Use Not Currently   Types: Marijuana    Social History   Socioeconomic History   Marital status: Single    Spouse name: Not on file   Number of children: Not on file   Years of education: Not on file   Highest education level: Not on file  Occupational History   Not on file  Tobacco Use   Smoking status: Every Day    Types: Cigarettes   Smokeless tobacco: Current  Vaping Use   Vaping status: Never Used  Substance and Sexual Activity   Alcohol use: Yes    Alcohol/week: 1.0 standard drink of alcohol    Types: 1 Cans of beer per week    Comment: one can of beer weekly   Drug use: Not Currently    Types: Marijuana   Sexual activity: Yes    Birth control/protection: Condom  Other Topics Concern   Not on file  Social History Narrative   ** Merged History Encounter **       Social Drivers of Health    Tobacco Use: High Risk (12/07/2024)   Patient History    Smoking Tobacco Use: Every Day    Smokeless Tobacco Use: Current    Passive Exposure: Not on file  Financial Resource Strain: Low Risk (01/13/2023)   Overall Financial Resource Strain (CARDIA)    Difficulty of Paying Living Expenses: Not hard at all  Food Insecurity: Food Insecurity Present (12/07/2024)   Epic    Worried About Programme Researcher, Broadcasting/film/video in the Last Year: Sometimes true    Ran Out of Food in the Last Year: Never true  Transportation Needs: No Transportation Needs (12/07/2024)   Epic    Lack of Transportation (Medical): No    Lack of Transportation (Non-Medical): No  Physical Activity: Sufficiently Active (01/13/2023)   Exercise Vital Sign    Days of Exercise per Week: 5 days    Minutes of Exercise per Session: 60 min  Stress:  Stress Concern Present (01/13/2023)   [employer] of Occupational Health - Occupational Stress Questionnaire    Feeling of Stress : To some extent  Social Connections: Socially Isolated (05/11/2024)   Social Connection and Isolation Panel    Frequency of Communication with Friends and Family: Twice a week    Frequency of Social Gatherings with Friends and Family: Once a week    Attends Religious Services: Never    Database Administrator or Organizations: No    Attends Banker Meetings: Never    Marital Status: Never married  Depression (PHQ2-9): High Risk (11/01/2024)   Depression (PHQ2-9)    PHQ-2 Score: 12  Alcohol Screen: Low Risk (12/07/2024)   Alcohol Screen    Last Alcohol Screening Score (AUDIT): 1  Housing: Low Risk (12/07/2024)   Epic    Unable to Pay for Housing in the Last Year: No    Number of Times Moved in the Last Year: 0    Homeless in the Last Year: No  Utilities: Not At Risk (12/07/2024)   Epic    Threatened with loss of utilities: No  Health Literacy: Not on file   Past Medical History:  Past Medical History:  Diagnosis Date   Depression     Hypertension    Paranoid schizophrenia (HCC)    Schizophrenia (HCC)    History reviewed. No pertinent surgical history.  Current Medications: Current Facility-Administered Medications  Medication Dose Route Frequency Provider Last Rate Last Admin   acetaminophen  (TYLENOL ) tablet 1,000 mg  1,000 mg Oral Q6H PRN Montague, Crystal J, NP   1,000 mg at 12/12/24 0850   amLODipine  (NORVASC ) tablet 10 mg  10 mg Oral Daily Trudy Anthony HERO, MD   10 mg at 12/12/24 0848   benazepril  (LOTENSIN ) tablet 40 mg  40 mg Oral Daily Trudy Anthony HERO, MD   40 mg at 12/12/24 0848   cloNIDine  (CATAPRES ) tablet 0.1 mg  0.1 mg Oral BID Hampton, Tracie B, NP   0.1 mg at 12/12/24 0848   hydrochlorothiazide  (HYDRODIURIL ) tablet 25 mg  25 mg Oral Daily Khan, Ghalib, MD   25 mg at 12/12/24 0848   metoprolol  tartrate (LOPRESSOR ) tablet 25 mg  25 mg Oral Q6H PRN Williams, Jamiese M, MD       multivitamin with minerals tablet 1 tablet  1 tablet Oral Daily Jadapalle, Sree, MD   1 tablet at 12/12/24 0848   nicotine  (NICODERM CQ  - dosed in mg/24 hours) patch 21 mg  21 mg Transdermal Daily PRN Jadapalle, Sree, MD   21 mg at 12/12/24 9146   OLANZapine  (ZYPREXA ) injection 5 mg  5 mg Intramuscular TID PRN Onuoha, Chinwendu V, NP       OLANZapine  zydis (ZYPREXA ) disintegrating tablet 5 mg  5 mg Oral TID PRN Onuoha, Chinwendu V, NP       risperiDONE  (RISPERDAL ) tablet 2 mg  2 mg Oral BID Hampton, Tracie B, NP   2 mg at 12/12/24 0848   traZODone  (DESYREL ) tablet 50 mg  50 mg Oral QHS Jadapalle, Sree, MD   50 mg at 12/11/24 2108   Vitamin D  (Ergocalciferol ) (DRISDOL ) 1.25 MG (50000 UNIT) capsule 50,000 Units  50,000 Units Oral Q7 days Jadapalle, Sree, MD   50,000 Units at 12/09/24 9089    Lab Results: No results found for this or any previous visit (from the past 48 hours).  Blood Alcohol level:  Lab Results  Component Value Date   [facility] <15 12/06/2024   ETH <  15 07/10/2024    Metabolic Disorder Labs: Lab Results   Component Value Date   HGBA1C 5.6 07/10/2024   MPG 114.02 07/10/2024   MPG 111.15 05/10/2024   Lab Results  Component Value Date   PROLACTIN 4.6 05/10/2024   PROLACTIN 55.0 (H) 10/13/2017   Lab Results  Component Value Date   CHOL 233 (H) 07/10/2024   TRIG 151 (H) 07/10/2024   HDL 101 07/10/2024   CHOLHDL 2.3 07/10/2024   VLDL 30 07/10/2024   LDLCALC 102 (H) 07/10/2024   LDLCALC 107 (H) 05/10/2024    Physical Findings: AIMS:  , ,  ,  ,    CIWA:    COWS:      Psychiatric Specialty Exam:  Presentation  General Appearance:  Appropriate for Environment; Casual  Eye Contact: Good  Speech: Clear and Coherent; Normal Rate  Speech Volume: Normal    Mood and Affect  Mood: Euthymic  Affect: Appropriate; Congruent   Thought Process  Thought Processes: Coherent; Linear  Orientation:Full (Time, Place and Person)  Thought Content:Logical  Hallucinations:Hallucinations: Auditory Description of Auditory Hallucinations: scrambled - in terms of voices  Ideas of Reference:None  Suicidal Thoughts:Suicidal Thoughts: No  Homicidal Thoughts:Homicidal Thoughts: No   Sensorium  Memory: Immediate Fair; Recent Fair  Judgment: Fair  Insight: Fair   Art Therapist  Concentration: Fair  Attention Span: Fair  Recall: [employer] of Knowledge: Fair  Language: Fair   Psychomotor Activity  Psychomotor Activity: Psychomotor Activity: Normal  Musculoskeletal: Strength & Muscle Tone: within normal limits Gait & Station: normal Assets  Assets: Manufacturing Systems Engineer; Desire for Improvement; Housing; Financial Resources/Insurance    Physical Exam: Physical Exam Vitals and nursing note reviewed.  Constitutional:      Appearance: Normal appearance.  Pulmonary:     Effort: Pulmonary effort is normal.  Neurological:     Mental Status: He is alert and oriented to person, place, and time.  Psychiatric:        Mood and Affect: Mood  normal.        Behavior: Behavior normal.    Review of Systems  Constitutional: Negative.   HENT: Negative.    Eyes: Negative.   Respiratory: Negative.  Negative for shortness of breath.   Cardiovascular: Negative.  Negative for chest pain.  Gastrointestinal: Negative.  Negative for diarrhea, nausea and vomiting.  Genitourinary: Negative.   Skin: Negative.   Neurological: Negative.   Endo/Heme/Allergies: Negative.   Psychiatric/Behavioral:  Positive for hallucinations. Negative for depression and suicidal ideas.   All other systems reviewed and are negative.  Blood pressure (!) 122/96, pulse (!) 103, temperature 98.7 F (37.1 C), temperature source Oral, resp. rate 17, height 5' 11 (1.803 m), weight 86.2 kg, SpO2 99%. Body mass index is 26.5 kg/m.  Diagnosis: Principal Problem:   Schizoaffective disorder, unspecified type (HCC)   PLAN: Safety and Monitoring:  -- Voluntary admission to inpatient psychiatric unit for safety, stabilization and treatment  -- Daily contact with patient to assess and evaluate symptoms and progress in treatment  -- Patient's case to be discussed in multi-disciplinary team meeting  -- Observation Level : q15 minute checks  -- Vital signs:  q12 hours  -- Precautions: suicide, elopement, and assault -- Encouraged patient to participate in unit milieu and in scheduled group therapies   2. Psychiatric Treatment:  Scheduled Medications: Risperidone  2 mg BID  NRT     -- The risks/benefits/side-effects/alternatives to this medication were discussed in detail with the patient and time was given  for questions. The patient consents to medication trial.   3. Medical Issues Being Addressed:  HTN-- clonidine  0.1 mg BID Hospitalist consulted due to elevated blood pressure and heart rate.  See most recent note for plan. Amlodipine  10 mg daily Lotensin  40 mg daily Hydrochlorothiazide  25 mg daily Lopressor  25 mg as needed for heart rate above 120   4.  Discharge Planning:   -- Social work and case management to assist with discharge planning and identification of hospital follow-up needs prior to discharge  -- Estimated LOS: 5-7 days  Plan for discharge home tomorrow.  Ghadeer Kastelic, NP 12/12/2024, 12:31 PM  "

## 2024-12-12 NOTE — Group Note (Signed)
 Date:  12/12/2024 Time:  8:59 PM  Group Topic/Focus:  Wrap-Up Group:   The focus of this group is to help patients review their daily goal of treatment and discuss progress on daily workbooks.    Participation Level:  Did Not Attend  Participation Quality:  Appropriate and Attentive  Affect:  Appropriate  Cognitive:  Alert and Appropriate  Insight: Appropriate and Good  Engagement in Group:  Engaged  Modes of Intervention:  Discussion  Additional Comments:     Arlester CHRISTELLA Servant 12/12/2024, 8:59 PM

## 2024-12-12 NOTE — Progress Notes (Signed)
" °   12/12/24 0600  Psych Admission Type (Psych Patients Only)  Admission Status Involuntary  Psychosocial Assessment  Patient Complaints None  Eye Contact Fair  Facial Expression Flat  Affect Appropriate to circumstance  Speech Logical/coherent  Interaction Assertive  Motor Activity Slow  Appearance/Hygiene Unremarkable  Behavior Characteristics Cooperative  Mood Pleasant  Aggressive Behavior  Effect No apparent injury  Thought Process  Coherency WDL  Content WDL  Delusions None reported or observed  Perception Hallucinations  Hallucination Auditory;Visual  Judgment Limited  Confusion None  Danger to Self  Current suicidal ideation? Denies  Danger to Others  Danger to Others None reported or observed    "

## 2024-12-12 NOTE — Group Note (Signed)
 [facility] LCSW Group Therapy Note   Group Date: 12/12/2024 Start Time: 1300 End Time: 1440   Type of Therapy/Topic:  Group Therapy:  Emotion Regulation  Participation Level:  Active   Description of Group:    The purpose of this group is to assist patients in learning to regulate negative emotions and experience positive emotions. Patients will be guided to discuss ways in which they have been vulnerable to their negative emotions. These vulnerabilities will be juxtaposed with experiences of positive emotions or situations, and patients challenged to use positive emotions to combat negative ones. Special emphasis will be placed on coping with negative emotions in conflict situations, and patients will process healthy conflict resolution skills.  Therapeutic Goals: Patient will identify two positive emotions or experiences to reflect on in order to balance out negative emotions:  Patient will label two or more emotions that they find the most difficult to experience:  Patient will be able to demonstrate positive conflict resolution skills through discussion or role plays:   Summary of Patient Progress: Patient was present for the entirety of the group discussion. He was actively engaged in the discussion and his comments/feedback helped further the conversation. Pt appeared to have some insight into himself and the topic. He appeared open and receptive to feedback/comments from both his peers and the facilitator.   Therapeutic Modalities:   Cognitive Behavioral Therapy Feelings Identification Dialectical Behavioral Therapy   Nadara JONELLE Fam, LCSW

## 2024-12-12 NOTE — Plan of Care (Signed)

## 2024-12-12 NOTE — Group Note (Signed)
 Date:  12/12/2024 Time:  10:12 AM  Group Topic/Focus:  Goals Group:   The focus of this group is to help patients establish daily goals to achieve during treatment and discuss how the patient can incorporate goal setting into their daily lives to aide in recovery.    Participation Level:  Did Not Attend   Damon [name] 12/12/2024, 10:12 AM

## 2024-12-12 NOTE — Discharge Summary (Incomplete)
 " Physician Discharge Summary Note  Patient:  Damon [name] is an 59 y.o., male MRN:  [id number] DOB:  1966-05-07 Patient phone:  [phone] (home)  Patient address:   [address] [place] [address],   Total time spent: 40 min Date of Admission:  12/07/2024 Date of Discharge: 12/13/2024  Reason for Admission:   The patient presents to ED via POV with worsening auditory hallucinations commanding him to hurt self. Patient has Hx of treatment for Schizoaffective disorder, depressive type Currently prescribed:risperidone , benztropine , trazodone . He reports Noncompliance in past 3 weeks as he reports losing Medicaid.  Principal Problem: Schizoaffective disorder, unspecified type [facility]) Discharge Diagnoses: Principal Problem:   Schizoaffective disorder, unspecified type (HCC)   Past Psychiatric History: see h&p  Family Psychiatric  History: see h&p Social History:  Social History   Substance and Sexual Activity  Alcohol Use Yes   Alcohol/week: 1.0 standard drink of alcohol   Types: 1 Cans of beer per week   Comment: one can of beer weekly     Social History   Substance and Sexual Activity  Drug Use Not Currently   Types: Marijuana    Social History   Socioeconomic History   Marital status: Single    Spouse name: Not on file   Number of children: Not on file   Years of education: Not on file   Highest education level: Not on file  Occupational History   Not on file  Tobacco Use   Smoking status: Every Day    Types: Cigarettes   Smokeless tobacco: Current  Vaping Use   Vaping status: Never Used  Substance and Sexual Activity   Alcohol use: Yes    Alcohol/week: 1.0 standard drink of alcohol    Types: 1 Cans of beer per week    Comment: one can of beer weekly   Drug use: Not Currently    Types: Marijuana   Sexual activity: Yes    Birth control/protection: Condom  Other Topics Concern   Not on file  Social History Narrative   ** Merged History  Encounter **       Social Drivers of Health   Tobacco Use: High Risk (12/07/2024)   Patient History    Smoking Tobacco Use: Every Day    Smokeless Tobacco Use: Current    Passive Exposure: Not on file  Financial Resource Strain: Low Risk (01/13/2023)   Overall Financial Resource Strain (CARDIA)    Difficulty of Paying Living Expenses: Not hard at all  Food Insecurity: Food Insecurity Present (12/07/2024)   Epic    Worried About Programme Researcher, Broadcasting/film/video in the Last Year: Sometimes true    Ran Out of Food in the Last Year: Never true  Transportation Needs: No Transportation Needs (12/07/2024)   Epic    Lack of Transportation (Medical): No    Lack of Transportation (Non-Medical): No  Physical Activity: Sufficiently Active (01/13/2023)   Exercise Vital Sign    Days of Exercise per Week: 5 days    Minutes of Exercise per Session: 60 min  Stress: Stress Concern Present (01/13/2023)   [employer] of Occupational Health - Occupational Stress Questionnaire    Feeling of Stress : To some extent  Social Connections: Socially Isolated (05/11/2024)   Social Connection and Isolation Panel    Frequency of Communication with Friends and Family: Twice a week    Frequency of Social Gatherings with Friends and Family: Once a week    Attends Religious Services:  Never    Active Member of Clubs or Organizations: No    Attends Banker Meetings: Never    Marital Status: Never married  Depression (PHQ2-9): High Risk (11/01/2024)   Depression (PHQ2-9)    PHQ-2 Score: 12  Alcohol Screen: Low Risk (12/07/2024)   Alcohol Screen    Last Alcohol Screening Score (AUDIT): 1  Housing: Low Risk (12/07/2024)   Epic    Unable to Pay for Housing in the Last Year: No    Number of Times Moved in the Last Year: 0    Homeless in the Last Year: No  Utilities: Not At Risk (12/07/2024)   Epic    Threatened with loss of utilities: No  Health Literacy: Not on file   Past Medical History:  Past Medical  History:  Diagnosis Date   Depression    Hypertension    Paranoid schizophrenia (HCC)    Schizophrenia (HCC)    History reviewed. No pertinent surgical history. Family History:  Family History  Problem Relation Age of Onset   Colon cancer Mother    Hyperlipidemia Father    Heart failure Brother    Diabetes Other    Stroke Other     Hospital Course:  ***  On admission,  Detailed risk assessment is complete based on clinical exam and individual risk factors and acute suicide risk is low and acute violence risk is low.    On the day of discharge, patient denies SI/HI/plan and denies hallucinations.  Patient remains future oriented and is willing to participate in outpatient mental health services.  Currently, all modifiable risk of harm to self/harm to others have been addressed and patient is no longer appropriate for the acute inpatient setting and is able to continue treatment for mental health needs in the community with the supports as indicated below.  Patient is educated and verbalized understanding of discharge plan of care including medications, follow-up appointments, mental health resources and further crisis services in the community.  He is instructed to call 911 or present to the nearest emergency room should he experience any decompensation in mood, disturbance of bowel or return of suicidal/homicidal ideations.  Patient verbalizes understanding of this education and agrees to this plan of care  Physical Findings: AIMS:  , ,  ,  ,    CIWA:    COWS:      Psychiatric Specialty Exam:  Presentation  General Appearance:  Appropriate for Environment; Casual  Eye Contact: Good  Speech: Clear and Coherent; Normal Rate  Speech Volume: Normal    Mood and Affect  Mood: Euthymic  Affect: Appropriate; Congruent   Thought Process  Thought Processes: Coherent; Linear  Descriptions of Associations:Intact  Orientation:Full (Time, Place and Person)  Thought  Content:Logical  Hallucinations:Hallucinations: Auditory Description of Auditory Hallucinations: scrambled - in terms of voices  Ideas of Reference:None  Suicidal Thoughts:Suicidal Thoughts: No  Homicidal Thoughts:Homicidal Thoughts: No   Sensorium  Memory: Immediate Fair; Recent Fair  Judgment: Fair  Insight: Fair   Art Therapist  Concentration: Fair  Attention Span: Fair  Recall: [employer] of Knowledge: Fair  Language: Fair   Psychomotor Activity  Psychomotor Activity: Psychomotor Activity: Normal  Musculoskeletal: Strength & Muscle Tone: {desc; muscle tone:32375} Gait & Station: {PE GAIT ED WJUO:77474} Assets  Assets: Communication Skills; Desire for Improvement; Housing; Financial Resources/Insurance   Sleep  Sleep: Sleep: Good    Physical Exam: Physical Exam ROS Blood pressure 127/82, pulse 99, temperature 98.6 F (37 C), temperature source  Oral, resp. rate 17, height 5' 11 (1.803 m), weight 86.2 kg, SpO2 100%. Body mass index is 26.5 kg/m.   Tobacco Use History[1] Tobacco Cessation:  {Discharge tobacco cessation prescription:304700209}   Blood Alcohol level:  Lab Results  Component Value Date   V [facility] <15 12/06/2024   ETH <15 07/10/2024    Metabolic Disorder Labs:  Lab Results  Component Value Date   HGBA1C 5.6 07/10/2024   MPG 114.02 07/10/2024   MPG 111.15 05/10/2024   Lab Results  Component Value Date   PROLACTIN 4.6 05/10/2024   PROLACTIN 55.0 (H) 10/13/2017   Lab Results  Component Value Date   CHOL 233 (H) 07/10/2024   TRIG 151 (H) 07/10/2024   HDL 101 07/10/2024   CHOLHDL 2.3 07/10/2024   VLDL 30 07/10/2024   LDLCALC 102 (H) 07/10/2024   LDLCALC 107 (H) 05/10/2024    See Psychiatric Specialty Exam and Suicide Risk Assessment completed by Attending Physician prior to discharge.  Discharge destination:  Home  Is patient on multiple antipsychotic therapies at discharge:  No   Has Patient had  three or more failed trials of antipsychotic monotherapy by history:  No  Recommended Plan for Multiple Antipsychotic Therapies: NA  Discharge Instructions     Increase activity slowly   Complete by: As directed       Allergies as of 12/12/2024       Reactions   Spinach Hives   Zofran  [ondansetron ] Rash        Medication List     STOP taking these medications    benztropine  1 MG tablet Commonly known as: COGENTIN    hydrOXYzine  25 MG tablet Commonly known as: ATARAX    Multivitamin Men 50+ Tabs   traZODone  50 MG tablet Commonly known as: DESYREL        TAKE these medications      Indication  amLODipine  10 MG tablet Commonly known as: NORVASC  Take 1 tablet (10 mg total) by mouth daily. For hypertension  Indication: High Blood Pressure   benazepril  10 MG tablet Commonly known as: LOTENSIN  Take 1 tablet (10 mg total) by mouth daily. For hypertension.  Indication: High Blood Pressure   cloNIDine  0.1 MG tablet Commonly known as: CATAPRES  Take 1 tablet (0.1 mg total) by mouth 2 (two) times daily. Start taking on: December 13, 2024  Indication: High Blood Pressure   Ensure Original Liqd Take 1 Bottle by mouth 2 (two) times daily as needed (poor PO intake).  Indication: Nutritional Support   hydrochlorothiazide  25 MG tablet Commonly known as: HYDRODIURIL  Take 1 tablet (25 mg total) by mouth daily. Start taking on: December 13, 2024  Indication: High Blood Pressure   nicotine  21 mg/24hr patch Commonly known as: NICODERM CQ  - dosed in mg/24 hours Place 1 patch (21 mg total) onto the skin daily as needed (nicotine  withdrawal). What changed:  reasons to take this additional instructions  Indication: Nicotine  Addiction   risperiDONE  2 MG tablet Commonly known as: RISPERDAL  Take 1 tablet (2 mg total) by mouth 2 (two) times daily. Start taking on: December 13, 2024 What changed:  medication strength how much to take additional instructions  Indication:  Schizophrenia   Vitamin D  (Ergocalciferol ) 1.25 MG (50000 UNIT) Caps capsule Commonly known as: DRISDOL  Take 1 capsule (50,000 Units total) by mouth every 7 (seven) days. For bone health.  Indication: Vitamin D  Deficiency        Follow-up Information     [facility].   Why:  Therapy appointment is scheduled for 12/27/2024 at 11AM with [facility]. Medication management appointment is scheduled for March 2nd, 2026 at 1:30PM with Dr. Chipper. Contact information: [address] [place] [address] [phone]                 Follow-up recommendations:   Follow up with providers listed above    Signed: Jonnette Nuon, NP 12/12/2024, 11:10 PM           [1]  Social History Tobacco Use  Smoking Status Every Day   Types: Cigarettes  Smokeless Tobacco Current   "

## 2024-12-13 NOTE — Group Note (Signed)
 Date:  12/13/2024 Time:  9:38 AM  Group Topic/Focus:  Activity Group: The focus of the group is to encourage the patients to go outside to the courtyard and get some fresh air and some exercise.   Participation Level:  Did Not Attend   Camellia HERO Tobe Kervin 12/13/2024, 9:38 AM

## 2024-12-13 NOTE — Plan of Care (Signed)

## 2024-12-13 NOTE — Plan of Care (Signed)

## 2024-12-13 NOTE — Progress Notes (Signed)
" °   12/13/24 0800  Psych Admission Type (Psych Patients Only)  Admission Status Voluntary  Psychosocial Assessment  Patient Complaints Anxiety  Eye Contact Fair  Facial Expression Flat  Affect Appropriate to circumstance  Speech Logical/coherent  Interaction Assertive  Motor Activity Slow  Appearance/Hygiene Unremarkable  Behavior Characteristics Cooperative;Appropriate to situation  Mood Pleasant  Aggressive Behavior  Effect No apparent injury  Thought Process  Coherency WDL  Content WDL  Delusions WDL  Perception Hallucinations  Hallucination Auditory;Visual  Judgment Limited  Confusion None  Danger to Self  Current suicidal ideation? Denies  Danger to Others  Danger to Others None reported or observed    "

## 2024-12-13 NOTE — Progress Notes (Signed)
" °  [facility] Adult Case Management Discharge Plan :  Will you be returning to the same living situation after discharge:  Yes,  pt reports that he is returning home.  At discharge, do you have transportation home?: Yes,  CSW to assist with transportation needs.  Do you have the ability to pay for your medications: Yes,  [facility] [place]  Release of information consent forms completed and in the chart;  Patient's signature needed at discharge.  Patient to Follow up at:  Follow-up Information     [facility].   Why: Therapy appointment is scheduled for 12/27/2024 at 11AM with [facility]. Medication management appointment is scheduled for March 2nd, 2026 at 1:30PM with Dr. Chipper. Contact information: [address] [place] [address] [phone]                 Next level of care provider has access to [facility] Link:yes  Safety Planning and Suicide Prevention discussed: Yes,  SPE completed with the patient.  Collateral attempts were not successful.      Has patient been referred to the Quitline?: Patient refused referral for treatment  Patient has been referred for addiction treatment: No known substance use disorder.  Sherryle JINNY Margo, LCSW 12/13/2024, 9:25 AM "

## 2024-12-13 NOTE — Progress Notes (Signed)
" °   12/12/24 2220  Psych Admission Type (Psych Patients Only)  Admission Status Involuntary  Psychosocial Assessment  Patient Complaints Anxiety  Eye Contact Fair  Facial Expression Flat  Affect Appropriate to circumstance  Speech Logical/coherent  Interaction Assertive  Motor Activity Slow  Appearance/Hygiene Unremarkable  Behavior Characteristics Cooperative;Appropriate to situation  Mood Pleasant  Aggressive Behavior  Effect No apparent injury  Thought Process  Coherency WDL  Content WDL  Delusions None reported or observed  Perception Hallucinations  Hallucination Auditory;Visual  Judgment Limited  Confusion None  Danger to Self  Current suicidal ideation? Denies  Danger to Others  Danger to Others None reported or observed    "

## 2024-12-27 ENCOUNTER — Ambulatory Visit (HOSPITAL_COMMUNITY): Admitting: Clinical

## 2025-01-10 ENCOUNTER — Ambulatory Visit (HOSPITAL_COMMUNITY): Admitting: Clinical

## 2025-01-14 ENCOUNTER — Ambulatory Visit (HOSPITAL_COMMUNITY)

## 2025-01-24 ENCOUNTER — Ambulatory Visit (HOSPITAL_COMMUNITY): Admitting: Clinical

## 2025-02-07 ENCOUNTER — Ambulatory Visit (HOSPITAL_COMMUNITY): Admitting: Clinical
